# Patient Record
Sex: Male | Born: 1935 | Race: Black or African American | Hispanic: No | Marital: Single | Smoking: Former smoker
Health system: Southern US, Community
[De-identification: ages and names within clinical notes are randomized; demographics above are authoritative.]

## PROBLEM LIST (undated history)

## (undated) DIAGNOSIS — E785 Hyperlipidemia, unspecified: Secondary | ICD-10-CM

## (undated) DIAGNOSIS — I2699 Other pulmonary embolism without acute cor pulmonale: Secondary | ICD-10-CM

## (undated) DIAGNOSIS — K635 Polyp of colon: Secondary | ICD-10-CM

## (undated) DIAGNOSIS — I10 Essential (primary) hypertension: Secondary | ICD-10-CM

## (undated) HISTORY — DX: Polyp of colon: K63.5

## (undated) HISTORY — DX: Hyperlipidemia, unspecified: E78.5

---

## 2006-04-04 ENCOUNTER — Emergency Department (HOSPITAL_COMMUNITY): Admission: EM | Admit: 2006-04-04 | Discharge: 2006-04-04 | Payer: Self-pay | Admitting: Emergency Medicine

## 2006-07-09 ENCOUNTER — Inpatient Hospital Stay (HOSPITAL_COMMUNITY): Admission: EM | Admit: 2006-07-09 | Discharge: 2006-07-11 | Payer: Self-pay | Admitting: Emergency Medicine

## 2006-07-28 ENCOUNTER — Ambulatory Visit (HOSPITAL_COMMUNITY): Admission: RE | Admit: 2006-07-28 | Discharge: 2006-07-28 | Payer: Self-pay | Admitting: Critical Care Medicine

## 2007-01-17 ENCOUNTER — Ambulatory Visit: Payer: Self-pay | Admitting: Critical Care Medicine

## 2007-01-17 LAB — CONVERTED CEMR LAB
ALT: 36 units/L (ref 0–40)
AST: 30 units/L (ref 0–37)
Albumin: 3.3 g/dL — ABNORMAL LOW (ref 3.5–5.2)
Alkaline Phosphatase: 39 units/L (ref 39–117)
BUN: 10 mg/dL (ref 6–23)
Bilirubin, Direct: 0.2 mg/dL (ref 0.0–0.3)
CO2: 33 meq/L — ABNORMAL HIGH (ref 19–32)
Calcium: 8.9 mg/dL (ref 8.4–10.5)
Chloride: 105 meq/L (ref 96–112)
Creatinine, Ser: 1.1 mg/dL (ref 0.4–1.5)
GFR calc Af Amer: 85 mL/min
GFR calc non Af Amer: 70 mL/min
Glucose, Bld: 93 mg/dL (ref 70–99)
Hemoglobin: 13.5 g/dL (ref 13.0–17.0)
INR: 1.1 (ref 0.9–2.0)
Potassium: 3.4 meq/L — ABNORMAL LOW (ref 3.5–5.1)
Prothrombin Time: 12.8 s (ref 10.0–14.0)
Sodium: 141 meq/L (ref 135–145)
Total Bilirubin: 0.9 mg/dL (ref 0.3–1.2)
Total Protein: 7.7 g/dL (ref 6.0–8.3)

## 2009-01-25 ENCOUNTER — Emergency Department (HOSPITAL_COMMUNITY): Admission: EM | Admit: 2009-01-25 | Discharge: 2009-01-25 | Payer: Self-pay | Admitting: Emergency Medicine

## 2009-01-26 ENCOUNTER — Emergency Department (HOSPITAL_COMMUNITY): Admission: EM | Admit: 2009-01-26 | Discharge: 2009-01-26 | Payer: Self-pay | Admitting: Emergency Medicine

## 2009-05-14 LAB — HM COLONOSCOPY

## 2010-12-25 ENCOUNTER — Emergency Department (HOSPITAL_COMMUNITY)
Admission: EM | Admit: 2010-12-25 | Discharge: 2010-12-25 | Disposition: A | Discharge status: Home / Self Care | Payer: Medicare Other | Attending: Emergency Medicine | Admitting: Emergency Medicine

## 2010-12-25 ENCOUNTER — Emergency Department (HOSPITAL_COMMUNITY): Payer: Medicare Other

## 2010-12-25 DIAGNOSIS — Y929 Unspecified place or not applicable: Secondary | ICD-10-CM | POA: Insufficient documentation

## 2010-12-25 DIAGNOSIS — E119 Type 2 diabetes mellitus without complications: Secondary | ICD-10-CM | POA: Insufficient documentation

## 2010-12-25 DIAGNOSIS — R29898 Other symptoms and signs involving the musculoskeletal system: Secondary | ICD-10-CM | POA: Insufficient documentation

## 2010-12-25 DIAGNOSIS — S62309A Unspecified fracture of unspecified metacarpal bone, initial encounter for closed fracture: Secondary | ICD-10-CM | POA: Insufficient documentation

## 2010-12-25 DIAGNOSIS — W230XXA Caught, crushed, jammed, or pinched between moving objects, initial encounter: Secondary | ICD-10-CM | POA: Insufficient documentation

## 2010-12-25 DIAGNOSIS — I10 Essential (primary) hypertension: Secondary | ICD-10-CM | POA: Insufficient documentation

## 2010-12-25 DIAGNOSIS — M7989 Other specified soft tissue disorders: Secondary | ICD-10-CM | POA: Insufficient documentation

## 2010-12-25 DIAGNOSIS — M79609 Pain in unspecified limb: Secondary | ICD-10-CM | POA: Insufficient documentation

## 2010-12-25 DIAGNOSIS — IMO0002 Reserved for concepts with insufficient information to code with codable children: Secondary | ICD-10-CM | POA: Insufficient documentation

## 2010-12-25 DIAGNOSIS — Z79899 Other long term (current) drug therapy: Secondary | ICD-10-CM | POA: Insufficient documentation

## 2011-01-06 LAB — POCT I-STAT, CHEM 8
BUN: 18 mg/dL (ref 6–23)
Calcium, Ion: 1.2 mmol/L (ref 1.12–1.32)
Chloride: 99 mEq/L (ref 96–112)
Creatinine, Ser: 1.2 mg/dL (ref 0.4–1.5)
Glucose, Bld: 367 mg/dL — ABNORMAL HIGH (ref 70–99)
HCT: 40 % (ref 39.0–52.0)
Hemoglobin: 13.6 g/dL (ref 13.0–17.0)
Potassium: 3.6 mEq/L (ref 3.5–5.1)
Sodium: 134 mEq/L — ABNORMAL LOW (ref 135–145)
TCO2: 26 mmol/L (ref 0–100)

## 2011-01-06 LAB — GLUCOSE, CAPILLARY: Glucose-Capillary: 391 mg/dL — ABNORMAL HIGH (ref 70–99)

## 2011-01-07 LAB — POCT I-STAT 3, VENOUS BLOOD GAS (G3P V)
Acid-Base Excess: 1 mmol/L (ref 0.0–2.0)
Bicarbonate: 25.6 mEq/L — ABNORMAL HIGH (ref 20.0–24.0)
O2 Saturation: 78 %
TCO2: 27 mmol/L (ref 0–100)
pCO2, Ven: 41.5 mmHg — ABNORMAL LOW (ref 45.0–50.0)
pH, Ven: 7.398 — ABNORMAL HIGH (ref 7.250–7.300)
pO2, Ven: 43 mmHg (ref 30.0–45.0)

## 2011-01-07 LAB — DIFFERENTIAL
Basophils Absolute: 0 10*3/uL (ref 0.0–0.1)
Basophils Relative: 0 % (ref 0–1)
Eosinophils Absolute: 0.1 10*3/uL (ref 0.0–0.7)
Eosinophils Relative: 2 % (ref 0–5)
Lymphocytes Relative: 31 % (ref 12–46)
Lymphs Abs: 1.9 10*3/uL (ref 0.7–4.0)
Monocytes Absolute: 0.5 10*3/uL (ref 0.1–1.0)
Monocytes Relative: 8 % (ref 3–12)
Neutro Abs: 3.7 10*3/uL (ref 1.7–7.7)
Neutrophils Relative %: 60 % (ref 43–77)

## 2011-01-07 LAB — GLUCOSE, CAPILLARY
Glucose-Capillary: 487 mg/dL — ABNORMAL HIGH (ref 70–99)
Glucose-Capillary: >600 mg/dL (ref 70–99)

## 2011-01-07 LAB — POCT CARDIAC MARKERS
CKMB, poc: 4.5 ng/mL (ref 1.0–8.0)
Myoglobin, poc: 190 ng/mL (ref 12–200)
Troponin i, poc: <0.05 ng/mL (ref 0.00–0.09)

## 2011-01-07 LAB — URINALYSIS, ROUTINE W REFLEX MICROSCOPIC
Bilirubin Urine: NEGATIVE
Glucose, UA: >1000 mg/dL — AB
Hgb urine dipstick: NEGATIVE
Ketones, ur: NEGATIVE mg/dL
Leukocytes, UA: NEGATIVE
Nitrite: NEGATIVE
Protein, ur: NEGATIVE mg/dL
Specific Gravity, Urine: 1.024 (ref 1.005–1.030)
Urobilinogen, UA: 0.2 mg/dL (ref 0.0–1.0)
pH: 5.5 (ref 5.0–8.0)

## 2011-01-07 LAB — BASIC METABOLIC PANEL
BUN: 28 mg/dL — ABNORMAL HIGH (ref 6–23)
CO2: 24 mEq/L (ref 19–32)
Calcium: 9.1 mg/dL (ref 8.4–10.5)
Chloride: 90 mEq/L — ABNORMAL LOW (ref 96–112)
Creatinine, Ser: 1.53 mg/dL — ABNORMAL HIGH (ref 0.4–1.5)
GFR calc Af Amer: 54 mL/min — ABNORMAL LOW (ref >60)
GFR calc non Af Amer: 45 mL/min — ABNORMAL LOW (ref >60)
Glucose, Bld: 721 mg/dL (ref 70–99)
Potassium: 4.1 mEq/L (ref 3.5–5.1)
Sodium: 123 mEq/L — ABNORMAL LOW (ref 135–145)

## 2011-01-07 LAB — URINE MICROSCOPIC-ADD ON

## 2011-01-07 LAB — CBC
HCT: 39.4 % (ref 39.0–52.0)
Hemoglobin: 13.7 g/dL (ref 13.0–17.0)
MCHC: 34.8 g/dL (ref 30.0–36.0)
MCV: 81.6 fL (ref 78.0–100.0)
Platelets: 179 10*3/uL (ref 150–400)
RBC: 4.83 MIL/uL (ref 4.22–5.81)
RDW: 14.2 % (ref 11.5–15.5)
WBC: 6.2 10*3/uL (ref 4.0–10.5)

## 2011-01-07 LAB — KETONES, QUALITATIVE: Acetone, Bld: NEGATIVE

## 2011-02-12 LAB — HEMOGLOBIN A1C: Hgb A1c MFr Bld: 5.4 % (ref 4.0–6.0)

## 2011-02-12 LAB — HEPATIC FUNCTION PANEL
ALT: 10 U/L (ref 10–40)
AST: 15 U/L (ref 14–40)
Alkaline Phosphatase: 49 U/L (ref 25–125)
Bilirubin, Total: 0.4 mg/dL

## 2011-02-12 LAB — LIPID PANEL
Cholesterol: 135 mg/dL (ref 0–200)
HDL: 46 mg/dL (ref 35–70)
LDL Cholesterol: 53 mg/dL
LDl/HDL Ratio: 2.9
Triglycerides: 180 mg/dL — AB (ref 40–160)

## 2011-02-12 LAB — BASIC METABOLIC PANEL
Glucose: 105 mg/dL
Potassium: 3.9 mmol/L (ref 3.4–5.3)

## 2011-02-13 NOTE — H&P (Signed)
NAME:  Sergio [name], Sergio [name]               ACCOUNT NO.:  [id number]   MEDICAL RECORD NO.:  [id number]          PATIENT TYPE:  INP   LOCATION:  0101                         FACILITY:  [facility]   PHYSICIAN:  Hollice Espy, M.D.DATE OF BIRTH:  1933/06/27   DATE OF ADMISSION:  07/09/2006  DATE OF DISCHARGE:                                HISTORY & PHYSICAL   PRIMARY CARE PHYSICIAN:  [facility].   CHIEF COMPLAINT:  Right-sided chest pain.   HISTORY OF PRESENT ILLNESS:  The patient is a 77 year old African-American  male with a past medical history of hypertension who presents to the  emergency room with 1-day episode of worsening chest pain on the right side  of his chest going down into his right axilla.  He has never had any  episodes like this before and came in to have this checked out.  In the  emergency room he was noted to have a blood pressure initially of 237/120.  He initially was given aspirin, morphine and nitroglycerin, and his blood  pressure has since then come down to as low as 155/79.  Since then it has  slightly trended up to the 190s and back down to the 160s.  The patient was  suspicious given his history of a pulmonary embolus and he had an elevated D-  dimer of 0.91.  The patient underwent a CT angiogram of his chest which  showed evidence of pulmonary emboli in the right upper and right lower lobe;  consistent with where his pain was.  At this point then, he was given  Lovenox and [facility] were called for admission.   The patient, himself, says that he is feeling much better.  He is not able  to give much of a history as far as why he had a blood clot.  He says that  he has not been ill lately; he has not been laid up in bed.  I asked him  about any long car trips, and he tells me that back about 6 weeks ago he  drove about 300 miles to drop his granddaughter off in college; however, the  time frame does not match up.  He tells me that he has not had any  previous  history of blood clots and no family history of blood clots.   The patient had an EKG done, also, on admission and he was noted to have  some flipped T waves in the lateral leads and possible strain pattern.  With  these findings he had cardiac enzymes done in the emergency room.  The  cardiac enzymes, on his first set, had a slightly elevated MB of 9.8 with a  normal CPK and troponin; however, the second set was completely normal.  Currently the patient is doing well.  He denies any headaches, vision  changes, or dysphagia.  He says that he can feel a little bit of right-sided  chest pain, but no palpitations, shortness of breath, wheeze, cough,  abdominal pain, hematuria, dysuria, constipation, diarrhea, local extremity  numbness, weakness or pain.   REVIEW OF SYSTEMS:  Otherwise  negative.   PAST MEDICAL HISTORY:  Significant just for hypertension.   MEDICATIONS:  1. Norvasc.  2. HCTZ.   ALLERGIES:  He has no known drug allergies.   SOCIAL HISTORY:  He denies any alcohol, tobacco, or drug [place].   FAMILY HISTORY:  Noncontributory.   PHYSICAL EXAMINATION:  VITAL SIGNS:  On admission temperature 97, heart rate  85, blood pressure initially 237/120.  Currently 165/76, respirations 22, O2  saturation 94% on room air.  GENERAL:  The patient is alert and oriented x3 in no apparent distress.  HEENT:  Normocephalic, atraumatic.  His mucous membranes are moist.  NECK:  He has no carotid bruits.  HEART:  Regular rate and rhythm with a 2/6 systolic ejection murmur.  LUNGS:  Clear to auscultation bilaterally.  ABDOMEN:  Soft, nontender.  Nondistended.  Positive bowel sounds.  EXTREMITIES:  No clubbing, cyanosis, or edema.   LAB WORK ON THIS PATIENT:  Sodium 139, potassium 3.4, chloride 104, bicarb  30, BUN 11, creatinine 0.9, glucose 184.  LFTs are noted for an albumin of  3.4 otherwise unremarkable.  White count 8.1, no shift.  H&H 14.2, 42.1, MCV  of 84, platelet count 235,  D-dimer slightly elevated at 0.91.  INR is 1, CT  is as HPI.  The first set of cardiac makers:  CPK 114, MB 9.8, troponin less  I than 0.05.  Second set: CPK 65, MB 5.7, troponin I less than 0.05.   ASSESSMENT AND PLAN:  1. Pulmonary embolus.  We will put the patient on Lovenox and Coumadin.      However, I have also contacted pulmonary critical care medicine to see      if the patient is candidate for a new drug study involving once a week      anticoagulant injection.  They will evaluate the patient and see if he      is a candidate.  2. Hypokalemia.  We will replace his potassium.  3. Hypertension.  I have added p.r.n. clonidine.  This may be more      secondary to stress and pain, also pain control.  4. Elevated blood sugar.  Will check hemoglobin A1c, rule out any      underlying diabetes that was not diagnosed.      Hollice Espy, M.D.  Electronically Signed     SKK/MEDQ  D:  07/09/2006  T:  07/09/2006  Job:  [id number]   cc:   Charlcie Cradle. Delford Field, MD, FCCP  [address]  [place]  [place] [address]

## 2011-02-13 NOTE — Discharge Summary (Signed)
NAME:  Sergio [name], Sergio [name]               ACCOUNT NO.:  [id number]   MEDICAL RECORD NO.:  [id number]          PATIENT TYPE:  INP   LOCATION:  1422                         FACILITY:  [facility]   PHYSICIAN:  Melissa L. Ladona Ridgel, MD  DATE OF BIRTH:  [birth year]   DATE OF ADMISSION:  07/09/2006  DATE OF DISCHARGE:  07/11/2006                           DISCHARGE SUMMARY - REFERRING   CHIEF COMPLAINT:  Right-sided chest pain.   DISCHARGE DIAGNOSES:  1. Pulmonary embolus of unclear etiology.  The patient presented to the      emergency room with one day of worsening right-sided chest pain      traveling down to his right axilla.  He has had previous episodes like      this but did not have them checked out.  He initially was hypertensive      in the emergency room and was treated with morphine, nitroglycerin and      aspirin.  The patient evidently had a history of pulmonary embolus      according to the admission note, therefore he underwent CT scanning of      the chest which showed evidence for a pulmonary embolus in the right      upper and right lower lobes consistent with the areas of pain.  The      patient was initially started on Lovenox and Coumadin and then was      evaluated by the research team from [facility] for an investigational drug      treating pulmonary emboli.  Patient agreed to participate in the      current study titled E[employer]IN 08-04-01 which uses a factor Xa inhibitor      by the name of Rivaroxaban.  The study is an open label study and the      patient was selected to start Rivaroxaban at 15 mg b.i.d.  Patient has      been monitored for 48 hours and has no obvious sequelae from starting      the medication and his symptoms of chest pain have all but decreased.      The patient will be followed very closely by the study coordinators.      The pager number for the study coordinators are Maylene Roes, [address]      [address]; [address], [phone]; and Jefm Bryant, [phone].   Please      note, Maylene Roes has given her pager to the patient for calls      concerning any problems.  Dr. Shan Levans is the principal      investigator and his number is [phone].  Patient therefore will be      discharged to home with the Rivaroxaban and will follow along with the      team at home.  2. Diabetes, newly diagnosed.  The patient's hemoglobin A1c was noted to      be elevated at 7.9.  We therefore have started him on Glucotrol and      have provided him with a prescription for a meter and strips.  Will  also make an outpatient referral to the diabetes care center so that he      can learn how to use this and provide him with printed material      regarding diabetic diet.  We would ask that his primary care physician      please coordinate his care with regard to this.  3. Hyperlipidemia.  In light of his underlying diabetes, I will start him      on Zocor 20 mg to treat that.  4. Hypertension.  The patient's blood pressure on admission was quite      elevated.  In resuming his Norvasc and hydrochlorothiazide, we have      obtained reasonable control so he will continue with Norvasc 10 mg once      daily and hydrochlorothiazide 12.5 mg once daily.  5. Very poor dentition.  The patient should be encouraged to see a dentist      for further care of his teeth.   DISCHARGE MEDICATIONS:  1. Glucotrol XL 5 mg once daily.  2. Norvasc 10 mg once daily.  3. Hydrochlorothiazide 12.5 mg once daily.  4. Zocor 20 mg once daily.  5. The study drug, Rivaroxaban 15 mg b.i.d.   HOSPITAL COURSE:  The patient is a very pleasant 77 year old gentleman with  a past medical history of hypertension who presented with chest pain and was  found to have a pulmonary embolus on CT scan.  The patient had no obvious  risk factors for pulmonary embolus, although there is question of whether he  may have had a history of a pulmonary embolus.  The patient was initially  treated with  Lovenox and ultimately underwent a review by the [facility] study  for enrollment.  Patient elected to enroll in the [facility] study using the  factor Xa inhibitor Rivaroxaban and he will be followed as an outpatient by  the study team.  As stated during this hospital stay, this patient has been  diagnosed with diabetes and hyperlipidemia for which he has been started on  medications and will need a follow-up to evaluate future laboratories and  complaints with treatment.   On the day of discharge the patient remains clinically stable.  He  occasionally has a little bit of right-sided chest pain in the area of the  pulmonary embolus but on the day of discharge his blood pressure is 148/74,  temperature 98, pulse 59, respirations 18, saturations 97%.  His blood  sugars have been 135, 118, 158 and he just was started on his Glucotrol.   PHYSICAL EXAMINATION:  GENERAL APPEARANCE:  This is a moderately obese  African American male in no acute distress.  VITAL SIGNS:  He is normocephalic and atraumatic.  HEENT:  Pupils are equal, round and reactive to light.  Extraocular muscles  are intact.  Moist mucous membranes.  The patient has generally poor  dentition.  NECK:  Supple.  There is no JVD, there is no carotid bruit.  CHEST:  Clear to auscultation but decreased.  CARDIOVASCULAR:  Regular rate and rhythm with positive S1 and S2, no S3, S4,  no murmurs, rubs, or gallops.  ABDOMEN:  Obese, nontender, nondistended with positive bowel sounds.  EXTREMITIES:  No clubbing, cyanosis, or edema.  NEUROLOGIC:  He is awake, alert and oriented.  Cranial nerves II-XII intact.  Power is 5/5.  Deep tendon reflexes are 2+.   PERTINENT DISCHARGE LABORATORY DATA:  Patient's lipid panel shows a  cholesterol of 125 with triglycerides  of 159 and HDL of 36 which is low and  an HDL of 57.  His sodium is 141, potassium 3.4, chloride 101, CO2 31, BUN 10, creatinine 1.1, glucose 140.  Troponins were negative.   Hemoglobin A1c  was 7.9.  His D-dimer was 0.91.   At this time, the patient needs to follow up with an established primary  care physician and continue to follow with the research team with regard to  his experimental medications.      Melissa L. Ladona Ridgel, MD  Electronically Signed     MLT/MEDQ  D:  07/11/2006  T:  07/11/2006  Job:  [id number]   cc:   Charlcie Cradle. Delford Field, MD, FCCP  [address]  [place]  [place] [address]   Dr. Merla Riches  [facility] on [address]  Valinda Hoar [phone]

## 2011-04-14 ENCOUNTER — Emergency Department (HOSPITAL_COMMUNITY)
Admission: EM | Admit: 2011-04-14 | Discharge: 2011-04-14 | Disposition: A | Discharge status: Home / Self Care | Payer: Medicare Other | Attending: Emergency Medicine | Admitting: Emergency Medicine

## 2011-04-14 ENCOUNTER — Emergency Department (HOSPITAL_COMMUNITY): Payer: Medicare Other

## 2011-04-14 DIAGNOSIS — E119 Type 2 diabetes mellitus without complications: Secondary | ICD-10-CM | POA: Insufficient documentation

## 2011-04-14 DIAGNOSIS — IMO0001 Reserved for inherently not codable concepts without codable children: Secondary | ICD-10-CM | POA: Insufficient documentation

## 2011-04-14 DIAGNOSIS — M25559 Pain in unspecified hip: Secondary | ICD-10-CM | POA: Insufficient documentation

## 2011-04-14 DIAGNOSIS — I10 Essential (primary) hypertension: Secondary | ICD-10-CM | POA: Insufficient documentation

## 2011-04-14 DIAGNOSIS — Z79899 Other long term (current) drug therapy: Secondary | ICD-10-CM | POA: Insufficient documentation

## 2011-06-04 ENCOUNTER — Emergency Department (HOSPITAL_COMMUNITY)
Admission: EM | Admit: 2011-06-04 | Discharge: 2011-06-04 | Disposition: A | Discharge status: Home / Self Care | Payer: Medicare Other | Attending: Emergency Medicine | Admitting: Emergency Medicine

## 2011-06-04 DIAGNOSIS — E876 Hypokalemia: Secondary | ICD-10-CM | POA: Insufficient documentation

## 2011-06-04 DIAGNOSIS — M7989 Other specified soft tissue disorders: Secondary | ICD-10-CM | POA: Insufficient documentation

## 2011-06-04 DIAGNOSIS — I10 Essential (primary) hypertension: Secondary | ICD-10-CM | POA: Insufficient documentation

## 2011-06-04 DIAGNOSIS — E119 Type 2 diabetes mellitus without complications: Secondary | ICD-10-CM | POA: Insufficient documentation

## 2011-06-04 DIAGNOSIS — R609 Edema, unspecified: Secondary | ICD-10-CM | POA: Insufficient documentation

## 2011-06-04 DIAGNOSIS — M79609 Pain in unspecified limb: Secondary | ICD-10-CM | POA: Insufficient documentation

## 2011-06-04 LAB — BASIC METABOLIC PANEL
BUN: 17 mg/dL (ref 6–23)
CO2: 31 mEq/L (ref 19–32)
Calcium: 9.3 mg/dL (ref 8.4–10.5)
Chloride: 104 mEq/L (ref 96–112)
Creatinine, Ser: 1.12 mg/dL (ref 0.50–1.35)
GFR calc Af Amer: >60 mL/min (ref >60)
GFR calc non Af Amer: >60 mL/min (ref >60)
Glucose, Bld: 123 mg/dL — ABNORMAL HIGH (ref 70–99)
Potassium: 3 mEq/L — ABNORMAL LOW (ref 3.5–5.1)
Sodium: 141 mEq/L (ref 135–145)

## 2011-06-04 LAB — GLUCOSE, CAPILLARY: Glucose-Capillary: 193 mg/dL — ABNORMAL HIGH (ref 70–99)

## 2011-08-22 ENCOUNTER — Emergency Department (HOSPITAL_COMMUNITY): Payer: Medicare Other

## 2011-08-22 ENCOUNTER — Encounter: Payer: Self-pay | Admitting: *Deleted

## 2011-08-22 ENCOUNTER — Emergency Department (HOSPITAL_COMMUNITY)
Admission: EM | Admit: 2011-08-22 | Discharge: 2011-08-22 | Disposition: A | Discharge status: Home / Self Care | Payer: Medicare Other | Attending: Emergency Medicine | Admitting: Emergency Medicine

## 2011-08-22 DIAGNOSIS — Z79899 Other long term (current) drug therapy: Secondary | ICD-10-CM | POA: Insufficient documentation

## 2011-08-22 DIAGNOSIS — M7989 Other specified soft tissue disorders: Secondary | ICD-10-CM | POA: Insufficient documentation

## 2011-08-22 DIAGNOSIS — R0789 Other chest pain: Secondary | ICD-10-CM | POA: Insufficient documentation

## 2011-08-22 DIAGNOSIS — R6883 Chills (without fever): Secondary | ICD-10-CM | POA: Insufficient documentation

## 2011-08-22 DIAGNOSIS — R5381 Other malaise: Secondary | ICD-10-CM | POA: Insufficient documentation

## 2011-08-22 DIAGNOSIS — R059 Cough, unspecified: Secondary | ICD-10-CM | POA: Insufficient documentation

## 2011-08-22 DIAGNOSIS — R0682 Tachypnea, not elsewhere classified: Secondary | ICD-10-CM | POA: Insufficient documentation

## 2011-08-22 DIAGNOSIS — E119 Type 2 diabetes mellitus without complications: Secondary | ICD-10-CM | POA: Insufficient documentation

## 2011-08-22 DIAGNOSIS — R062 Wheezing: Secondary | ICD-10-CM | POA: Insufficient documentation

## 2011-08-22 DIAGNOSIS — I10 Essential (primary) hypertension: Secondary | ICD-10-CM | POA: Insufficient documentation

## 2011-08-22 DIAGNOSIS — J3489 Other specified disorders of nose and nasal sinuses: Secondary | ICD-10-CM | POA: Insufficient documentation

## 2011-08-22 DIAGNOSIS — R05 Cough: Secondary | ICD-10-CM | POA: Insufficient documentation

## 2011-08-22 DIAGNOSIS — R630 Anorexia: Secondary | ICD-10-CM | POA: Insufficient documentation

## 2011-08-22 DIAGNOSIS — J40 Bronchitis, not specified as acute or chronic: Secondary | ICD-10-CM | POA: Insufficient documentation

## 2011-08-22 HISTORY — DX: Essential (primary) hypertension: I10

## 2011-08-22 LAB — DIFFERENTIAL
Basophils Absolute: 0 10*3/uL (ref 0.0–0.1)
Basophils Relative: 0 % (ref 0–1)
Eosinophils Absolute: 0.1 10*3/uL (ref 0.0–0.7)
Eosinophils Relative: 2 % (ref 0–5)
Lymphocytes Relative: 28 % (ref 12–46)
Lymphs Abs: 1.7 10*3/uL (ref 0.7–4.0)
Monocytes Absolute: 0.9 10*3/uL (ref 0.1–1.0)
Monocytes Relative: 15 % — ABNORMAL HIGH (ref 3–12)
Neutro Abs: 3.3 10*3/uL (ref 1.7–7.7)
Neutrophils Relative %: 55 % (ref 43–77)

## 2011-08-22 LAB — BASIC METABOLIC PANEL
BUN: 14 mg/dL (ref 6–23)
CO2: 28 mEq/L (ref 19–32)
Calcium: 8.8 mg/dL (ref 8.4–10.5)
Chloride: 98 mEq/L (ref 96–112)
Creatinine, Ser: 1.42 mg/dL — ABNORMAL HIGH (ref 0.50–1.35)
GFR calc Af Amer: 54 mL/min — ABNORMAL LOW (ref >90)
GFR calc non Af Amer: 46 mL/min — ABNORMAL LOW (ref >90)
Glucose, Bld: 118 mg/dL — ABNORMAL HIGH (ref 70–99)
Potassium: 3.4 mEq/L — ABNORMAL LOW (ref 3.5–5.1)
Sodium: 135 mEq/L (ref 135–145)

## 2011-08-22 LAB — CBC
HCT: 39.4 % (ref 39.0–52.0)
Hemoglobin: 13.5 g/dL (ref 13.0–17.0)
MCH: 28.9 pg (ref 26.0–34.0)
MCHC: 34.3 g/dL (ref 30.0–36.0)
MCV: 84.4 fL (ref 78.0–100.0)
Platelets: 193 10*3/uL (ref 150–400)
RBC: 4.67 MIL/uL (ref 4.22–5.81)
RDW: 15 % (ref 11.5–15.5)
WBC: 6 10*3/uL (ref 4.0–10.5)

## 2011-08-22 LAB — PRO B NATRIURETIC PEPTIDE: Pro B Natriuretic peptide (BNP): 75.3 pg/mL (ref 0–450)

## 2011-08-22 MED ORDER — IPRATROPIUM BROMIDE 0.02 % IN SOLN
0.5000 mg | Freq: Once | RESPIRATORY_TRACT | Status: AC
  Administered 2011-08-22: 0.5 mg via RESPIRATORY_TRACT
  Filled 2011-08-22: qty 2.5

## 2011-08-22 MED ORDER — AEROCHAMBER PLUS W/MASK MISC
Status: AC
  Administered 2011-08-22: 19:00:00
  Filled 2011-08-22: qty 1

## 2011-08-22 MED ORDER — ALBUTEROL SULFATE (5 MG/ML) 0.5% IN NEBU
5.0000 mg | INHALATION_SOLUTION | Freq: Once | RESPIRATORY_TRACT | Status: AC
  Administered 2011-08-22: 5 mg via RESPIRATORY_TRACT
  Filled 2011-08-22: qty 1

## 2011-08-22 MED ORDER — AZITHROMYCIN 250 MG PO TABS: ORAL_TABLET | ORAL | Status: AC

## 2011-08-22 MED ORDER — ALBUTEROL SULFATE HFA 108 (90 BASE) MCG/ACT IN AERS
1.0000 | INHALATION_SPRAY | Freq: Four times a day (QID) | RESPIRATORY_TRACT | Status: DC | PRN
  Administered 2011-08-22: 2 via RESPIRATORY_TRACT
  Filled 2011-08-22: qty 6.7

## 2011-08-22 NOTE — ED Notes (Signed)
Patient states he noted he was not feeling good on Wed,  On Thurs morning he noted onset of cough, nasal congestion, weakness, and nothing tastes good.  Patient reports nonproductive cough

## 2011-08-22 NOTE — ED Provider Notes (Signed)
History     CSN: [id number] Arrival date & time: 08/22/2011  2:07 PM   First MD Initiated Contact with Patient 08/22/11 1531      Chief Complaint  Patient presents with  . URI   HPI Patient states he's had a cough since Wednesday. He has felt chilled and has had some nasal congestion. Patient states the cough is nonproductive. He thinks cold weather makes it worse. Nothing has particularly made it better. He has not measured any fevers. He does feel like his appetite has been decreased. Patient states when he coughs he does have some chest discomfort that is sharp and otherwise he has no chest pain. He states he i had some  leg swelling but that is not really new. He denies any recent trips or travel. Patient denies any history of lung or heart problems. Patient does work outside at night and he feels this could be contributing to his illness. Past Medical History  Diagnosis Date  . Diabetes mellitus   . Hypertension     History reviewed. No pertinent past surgical history.  No family history on file.  History  Substance Use Topics  . Smoking status: Never Smoker   . Smokeless tobacco: Not on file  . Alcohol Use: No      Review of Systems  Constitutional: Positive for chills and fatigue.  Respiratory: Negative for choking.   Genitourinary: Negative for frequency.  Neurological: Negative for headaches.  All other systems reviewed and are negative.    Allergies  Review of patient's allergies indicates not on file.  Home Medications   Current Outpatient Rx  Name Route Sig Dispense Refill  . AMLODIPINE BESYLATE 10 MG PO TABS Oral Take 10 mg by mouth daily.      Marland Kitchen ALKA-SELTZER PLUS COLD PO Oral Take 1 packet by mouth 2 (two) times daily as needed. To ease cold symptoms     . GLIPIZIDE 10 MG PO TABS Oral Take 10 mg by mouth daily.        BP 158/86  Pulse 77  Temp(Src) 99.7 F (37.6 C) (Oral)  Resp 24  Ht 5\' 3"  (1.6 m)  Wt 190 lb (86.183 kg)  BMI 33.66 kg/m2   SpO2 96%  Physical Exam  Nursing note and vitals reviewed. Constitutional: He appears well-developed and well-nourished. No distress.  HENT:  Head: Normocephalic and atraumatic.  Right Ear: External ear normal.  Left Ear: External ear normal.  Eyes: Conjunctivae are normal. Right eye exhibits no discharge. Left eye exhibits no discharge. No scleral icterus.  Neck: Neck supple. No tracheal deviation present.  Cardiovascular: Normal rate, regular rhythm and intact distal pulses.   Pulmonary/Chest: Effort normal and breath sounds normal. No stridor. No respiratory distress. Wheezes: faint wheeze noted at end expiration. He has no rales.       Tachypnea noted  Abdominal: Soft. Bowel sounds are normal. He exhibits no distension. There is no tenderness. There is no rebound and no guarding.  Musculoskeletal: He exhibits no edema and no tenderness.  Neurological: He is alert. He has normal strength. No sensory deficit. Cranial nerve deficit:  no gross defecits noted. He exhibits normal muscle tone. He displays no seizure activity. Coordination normal.  Skin: Skin is warm and dry. No rash noted.  Psychiatric: He has a normal mood and affect.    ED Course  Procedures (including critical care time)  Labs Reviewed  DIFFERENTIAL - Abnormal; Notable for the following:    Monocytes Relative 15 (*)  All other components within normal limits  CBC  BASIC METABOLIC PANEL  PRO B NATRIURETIC PEPTIDE   Dg Chest 2 View  08/22/2011  *RADIOLOGY REPORT*  Clinical Data: 77 year old male with cough and congestion.  CHEST - 2 VIEW  Comparison: 01/25/2009  Findings: Cardiomegaly and mild peribronchial thickening again identified. There is no evidence of focal airspace disease, pulmonary edema, pulmonary nodule/mass, pleural effusion, or pneumothorax. No acute bony abnormalities are identified.  IMPRESSION: Cardiomegaly without evidence of acute cardiopulmonary disease.  Original Report Authenticated By:  Rosendo Gros, M.D.     1. Bronchitis       MDM  Patient without signs of pneumonia on x-ray. No evidence to suggest congestive heart failure. Patient has no risk factors for pulmonary embolism. The symptoms are suggestive of an infectious etiology.  Patient felt better after the breathing treatment. He'll be discharged home with an albuterol inhaler and a course of azithromycin.        Celene Kras, MD 08/22/11 [id number]

## 2011-08-22 NOTE — Discharge Instructions (Signed)

## 2011-12-18 ENCOUNTER — Encounter: Payer: Self-pay | Admitting: *Deleted

## 2011-12-18 DIAGNOSIS — I10 Essential (primary) hypertension: Secondary | ICD-10-CM | POA: Insufficient documentation

## 2011-12-18 DIAGNOSIS — E119 Type 2 diabetes mellitus without complications: Secondary | ICD-10-CM | POA: Insufficient documentation

## 2011-12-18 DIAGNOSIS — E785 Hyperlipidemia, unspecified: Secondary | ICD-10-CM | POA: Insufficient documentation

## 2011-12-29 ENCOUNTER — Ambulatory Visit (INDEPENDENT_AMBULATORY_CARE_PROVIDER_SITE_OTHER): Payer: Self-pay | Admitting: Emergency Medicine

## 2011-12-29 ENCOUNTER — Encounter: Payer: Self-pay | Admitting: Emergency Medicine

## 2011-12-29 VITALS — BP 201/89 | HR 68 | Temp 97.9°F | Resp 16 | Ht 62.0 in | Wt 204.2 lb

## 2011-12-29 DIAGNOSIS — E119 Type 2 diabetes mellitus without complications: Secondary | ICD-10-CM

## 2011-12-29 DIAGNOSIS — E782 Mixed hyperlipidemia: Secondary | ICD-10-CM

## 2011-12-29 DIAGNOSIS — IMO0001 Reserved for inherently not codable concepts without codable children: Secondary | ICD-10-CM

## 2011-12-29 DIAGNOSIS — E785 Hyperlipidemia, unspecified: Secondary | ICD-10-CM | POA: Diagnosis not present

## 2011-12-29 DIAGNOSIS — I10 Essential (primary) hypertension: Secondary | ICD-10-CM

## 2011-12-29 LAB — COMPREHENSIVE METABOLIC PANEL
ALT: 23 U/L (ref 0–53)
AST: 23 U/L (ref 0–37)
Albumin: 3.9 g/dL (ref 3.5–5.2)
Alkaline Phosphatase: 52 U/L (ref 39–117)
BUN: 17 mg/dL (ref 6–23)
CO2: 26 mEq/L (ref 19–32)
Calcium: 9.3 mg/dL (ref 8.4–10.5)
Chloride: 104 mEq/L (ref 96–112)
Creat: 1.29 mg/dL (ref 0.50–1.35)
Glucose, Bld: 169 mg/dL — ABNORMAL HIGH (ref 70–99)
Potassium: 3.6 mEq/L (ref 3.5–5.3)
Sodium: 140 mEq/L (ref 135–145)
Total Bilirubin: 0.5 mg/dL (ref 0.3–1.2)
Total Protein: 7.2 g/dL (ref 6.0–8.3)

## 2011-12-29 LAB — LIPID PANEL
Cholesterol: 95 mg/dL (ref 0–200)
HDL: 34 mg/dL — ABNORMAL LOW (ref >39)
LDL Cholesterol: 34 mg/dL (ref 0–99)
Total CHOL/HDL Ratio: 2.8 Ratio
Triglycerides: 137 mg/dL (ref <150)
VLDL: 27 mg/dL (ref 0–40)

## 2011-12-29 LAB — POCT GLYCOSYLATED HEMOGLOBIN (HGB A1C): Hemoglobin A1C: 9.5

## 2011-12-29 LAB — GLUCOSE, POCT (MANUAL RESULT ENTRY): POC Glucose: 194

## 2011-12-29 MED ORDER — METFORMIN HCL ER 500 MG PO TB24: 500.0000 mg | ORAL_TABLET | Freq: Every day | ORAL | Status: DC

## 2011-12-29 MED ORDER — GLIPIZIDE 10 MG PO TABS: 10.0000 mg | ORAL_TABLET | Freq: Every day | ORAL | Status: DC

## 2011-12-29 NOTE — Progress Notes (Signed)
  Subjective:    Patient ID: Sergio [name], male    DOB: 02-24-1933, 78 y.o.   MRN: [id number]  HPI patient in for followup of his high blood pressure diabetes and high cholesterol. He has not been here since May 2012. At that time his hemoglobin A1c was 5.4 with a sugar of 106 cholesterol 135. The test results he brings in today showed sugars ranging from 200-350. I suspect he has not been taking his medications as instructed    Review of Systems he denies chest pain shortness of breath or any other GI symptoms. He denies any problems with that his feet except he is unable to cut his nails.    Objective:   Physical Exam  Constitutional: He appears well-nourished.  HENT:  Head: Normocephalic.  Eyes: Pupils are equal, round, and reactive to light.  Neck: No JVD present. No tracheal deviation present. No thyromegaly present.  Cardiovascular: Normal rate and regular rhythm.   Pulmonary/Chest: Breath sounds normal. He has no wheezes. He has no rales.  Abdominal: Soft. There is no rebound.  Lymphadenopathy:    He has no cervical adenopathy.   Examination of his feet reveals he has sensation to his feet pulses are 1+ he does have significant nail deformities       Assessment & Plan:   Patient has diabetes and high cholesterol and hypertension none of which are under control. I've increased his Glucotrol to 10 mg one a day. I may readmit foremen extended release 500 one a day. 4 he had GI problems and would not take it because it bothered him at work .

## 2011-12-30 LAB — MICROALBUMIN, URINE: Microalb, Ur: 4.31 mg/dL — ABNORMAL HIGH (ref 0.00–1.89)

## 2011-12-30 LAB — CBC WITH DIFFERENTIAL/PLATELET
Basophils Absolute: 0 10*3/uL (ref 0.0–0.1)
Basophils Relative: 0 % (ref 0–1)
Eosinophils Absolute: 0.3 10*3/uL (ref 0.0–0.7)
Eosinophils Relative: 5 % (ref 0–5)
HCT: 36.4 % — ABNORMAL LOW (ref 39.0–52.0)
Hemoglobin: 11.9 g/dL — ABNORMAL LOW (ref 13.0–17.0)
Lymphocytes Relative: 32 % (ref 12–46)
Lymphs Abs: 1.7 10*3/uL (ref 0.7–4.0)
MCH: 27.2 pg (ref 26.0–34.0)
MCHC: 32.7 g/dL (ref 30.0–36.0)
MCV: 83.3 fL (ref 78.0–100.0)
Monocytes Absolute: 0.4 10*3/uL (ref 0.1–1.0)
Monocytes Relative: 7 % (ref 3–12)
Neutro Abs: 2.9 10*3/uL (ref 1.7–7.7)
Neutrophils Relative %: 56 % (ref 43–77)
Platelets: 222 10*3/uL (ref 150–400)
RBC: 4.37 MIL/uL (ref 4.22–5.81)
RDW: 14.8 % (ref 11.5–15.5)
WBC: 5.3 10*3/uL (ref 4.0–10.5)

## 2011-12-31 ENCOUNTER — Telehealth: Payer: Self-pay

## 2011-12-31 NOTE — Telephone Encounter (Signed)
Please call into [facility] on [address]:  Glucometer of choice #1  Use as directed. May also call in:  Glucose strips of choice #1 package, no refill  Lancets of choice 1 package, no refill Thank you

## 2011-12-31 NOTE — Telephone Encounter (Signed)
DAUGHTER IN LAW STATES DR DAUB WAS GOING TO SEND IN RX FOR GLUCOSE METER BUT [facility] ON [address] DOES NOT HAVE A RX FOR A GLUCOSE METER   PLEASE ADVISE

## 2012-01-01 ENCOUNTER — Telehealth: Payer: Self-pay

## 2012-01-01 NOTE — Telephone Encounter (Signed)
Called rx into pharm for any glucometer, 100 lancets and 100 test strips with 4 RF's each per Eula Listen, PA-C

## 2012-01-01 NOTE — Telephone Encounter (Signed)
The patient can have any meter that his insurance will cover and that the pharmacy has in stock if the patient wants to use [facility].

## 2012-01-01 NOTE — Telephone Encounter (Signed)
I called [facility] on [address]. [phone] the pharmacist informed me that they do not cary the Choice #1 brand. They cannot fill this order, he asked if there is any way that another brand be called in. He suggested that maybe it is a mail order only brand.

## 2012-01-11 NOTE — Telephone Encounter (Signed)
pts son called back after receiving unable to reach letter about labs. Gave him results and instr's from Dr Cleta Alberts. Agreed to f/up 3 mos

## 2012-02-09 ENCOUNTER — Ambulatory Visit: Payer: Medicare Other | Admitting: Emergency Medicine

## 2012-03-28 ENCOUNTER — Other Ambulatory Visit: Payer: Self-pay | Admitting: Family Medicine

## 2012-05-13 ENCOUNTER — Ambulatory Visit (INDEPENDENT_AMBULATORY_CARE_PROVIDER_SITE_OTHER): Payer: Self-pay | Admitting: Emergency Medicine

## 2012-05-13 VITALS — BP 208/74 | HR 73 | Temp 97.9°F | Resp 20 | Ht 62.0 in | Wt 200.8 lb

## 2012-05-13 DIAGNOSIS — E119 Type 2 diabetes mellitus without complications: Secondary | ICD-10-CM

## 2012-05-13 DIAGNOSIS — E7800 Pure hypercholesterolemia, unspecified: Secondary | ICD-10-CM

## 2012-05-13 LAB — GLUCOSE, POCT (MANUAL RESULT ENTRY): POC Glucose: 157 mg/dl — AB (ref 70–99)

## 2012-05-13 LAB — POCT GLYCOSYLATED HEMOGLOBIN (HGB A1C): Hemoglobin A1C: 13

## 2012-05-13 MED ORDER — METFORMIN HCL ER 500 MG PO TB24: 500.0000 mg | ORAL_TABLET | Freq: Every day | ORAL | Status: DC

## 2012-05-13 MED ORDER — GLIPIZIDE ER 10 MG PO TB24: 10.0000 mg | ORAL_TABLET | Freq: Every day | ORAL | Status: DC

## 2012-05-13 NOTE — Progress Notes (Signed)
  Subjective:    Patient ID: Sergio [name], male    DOB: 23-Dec-1932, 78 y.o.   MRN: [id number]  HPIPt has a history of diabetes.  Pt currently only taking glipizide because other medications make him have diarrhea.  Pts feet appear to be swollen.  Pt hasn't seen cardiologist in over a year.  He has not been compliant with his medicines or his diet has not been exercising. 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  HENT:  Head: Normocephalic.  Eyes: Pupils are equal, round, and reactive to light.  Neck: No thyromegaly present.  Cardiovascular: Normal rate and regular rhythm.   Pulmonary/Chest: Effort normal and breath sounds normal.  Abdominal: Soft. Bowel sounds are normal.  Neurological:       This has for swelling of the lower extremities pulses are present. He has no ulcerations seen.   Results for orders placed in visit on 05/13/12  POCT GLYCOSYLATED HEMOGLOBIN (HGB A1C)      Component Value Range   Hemoglobin A1C 13.0    GLUCOSE, POCT (MANUAL RESULT ENTRY)      Component Value Range   POC Glucose 157 (*) 70 - 99 mg/dl         Assessment & Plan:  The patient is noncompliant with his medications and with his diet not exercising and gaining weight. We'll try getting back on his medications. I did increase his glipizide to 10 mg he states the metformin gives her diarrhea by the time of a half dose to start with and then one a day to see if can get him to 500 mg. His blood pressures out of control not sure he is taking his medications for this either. I also advised him he needs needed diabetes eye Dr.. I told the patient he was a time bomb if he did not start to get these medical issues under control. I told him I needed to see him in one month. I did not stop his amlodipine even though he does have ankle edema because his blood  pressure is not anywhere close to control when he comes back I may just him off of amlodipine and see if that would help with his lower extremity swelling

## 2012-05-14 ENCOUNTER — Other Ambulatory Visit: Payer: Self-pay | Admitting: Family Medicine

## 2012-05-14 LAB — BASIC METABOLIC PANEL
BUN: 23 mg/dL (ref 6–23)
CO2: 24 mEq/L (ref 19–32)
Calcium: 9.7 mg/dL (ref 8.4–10.5)
Chloride: 100 mEq/L (ref 96–112)
Creat: 1.28 mg/dL (ref 0.50–1.35)
Glucose, Bld: 160 mg/dL — ABNORMAL HIGH (ref 70–99)
Potassium: 3.8 mEq/L (ref 3.5–5.3)
Sodium: 136 mEq/L (ref 135–145)

## 2012-05-14 LAB — LIPID PANEL
Cholesterol: 120 mg/dL (ref 0–200)
HDL: 34 mg/dL — ABNORMAL LOW (ref >39)
LDL Cholesterol: 51 mg/dL (ref 0–99)
Total CHOL/HDL Ratio: 3.5 Ratio
Triglycerides: 177 mg/dL — ABNORMAL HIGH (ref <150)
VLDL: 35 mg/dL (ref 0–40)

## 2012-06-23 ENCOUNTER — Other Ambulatory Visit: Payer: Self-pay

## 2012-06-23 MED ORDER — SIMVASTATIN 20 MG PO TABS: 20.0000 mg | ORAL_TABLET | Freq: Every day | ORAL | Status: DC

## 2012-06-23 MED ORDER — LISINOPRIL-HYDROCHLOROTHIAZIDE 20-12.5 MG PO TABS: 1.0000 | ORAL_TABLET | Freq: Every day | ORAL | Status: DC

## 2012-11-23 ENCOUNTER — Encounter (HOSPITAL_COMMUNITY): Payer: Self-pay | Admitting: *Deleted

## 2012-11-23 ENCOUNTER — Emergency Department (HOSPITAL_COMMUNITY)
Admission: EM | Admit: 2012-11-23 | Discharge: 2012-11-24 | Disposition: A | Discharge status: Home / Self Care | Payer: Medicare Other | Attending: Emergency Medicine | Admitting: Emergency Medicine

## 2012-11-23 DIAGNOSIS — E119 Type 2 diabetes mellitus without complications: Secondary | ICD-10-CM | POA: Diagnosis not present

## 2012-11-23 DIAGNOSIS — R112 Nausea with vomiting, unspecified: Secondary | ICD-10-CM | POA: Diagnosis not present

## 2012-11-23 DIAGNOSIS — R9431 Abnormal electrocardiogram [ECG] [EKG]: Secondary | ICD-10-CM | POA: Diagnosis not present

## 2012-11-23 DIAGNOSIS — Z86711 Personal history of pulmonary embolism: Secondary | ICD-10-CM | POA: Insufficient documentation

## 2012-11-23 DIAGNOSIS — Z79899 Other long term (current) drug therapy: Secondary | ICD-10-CM | POA: Insufficient documentation

## 2012-11-23 DIAGNOSIS — E785 Hyperlipidemia, unspecified: Secondary | ICD-10-CM | POA: Insufficient documentation

## 2012-11-23 DIAGNOSIS — Z7982 Long term (current) use of aspirin: Secondary | ICD-10-CM | POA: Insufficient documentation

## 2012-11-23 DIAGNOSIS — Z87891 Personal history of nicotine dependence: Secondary | ICD-10-CM | POA: Insufficient documentation

## 2012-11-23 DIAGNOSIS — E1169 Type 2 diabetes mellitus with other specified complication: Secondary | ICD-10-CM | POA: Diagnosis not present

## 2012-11-23 DIAGNOSIS — I10 Essential (primary) hypertension: Secondary | ICD-10-CM | POA: Diagnosis not present

## 2012-11-23 DIAGNOSIS — R739 Hyperglycemia, unspecified: Secondary | ICD-10-CM

## 2012-11-23 LAB — GLUCOSE, CAPILLARY
Glucose-Capillary: 358 mg/dL — ABNORMAL HIGH (ref 70–99)
Glucose-Capillary: 372 mg/dL — ABNORMAL HIGH (ref 70–99)

## 2012-11-23 LAB — COMPREHENSIVE METABOLIC PANEL WITH GFR
ALT: 26 U/L (ref 0–53)
AST: 29 U/L (ref 0–37)
Albumin: 3.6 g/dL (ref 3.5–5.2)
Alkaline Phosphatase: 73 U/L (ref 39–117)
BUN: 17 mg/dL (ref 6–23)
CO2: 27 meq/L (ref 19–32)
Calcium: 9.7 mg/dL (ref 8.4–10.5)
Chloride: 97 meq/L (ref 96–112)
Creatinine, Ser: 1.13 mg/dL (ref 0.50–1.35)
GFR calc Af Amer: 70 mL/min — ABNORMAL LOW
GFR calc non Af Amer: 61 mL/min — ABNORMAL LOW
Glucose, Bld: 398 mg/dL — ABNORMAL HIGH (ref 70–99)
Potassium: 4.2 meq/L (ref 3.5–5.1)
Sodium: 133 meq/L — ABNORMAL LOW (ref 135–145)
Total Bilirubin: 0.6 mg/dL (ref 0.3–1.2)
Total Protein: 8.6 g/dL — ABNORMAL HIGH (ref 6.0–8.3)

## 2012-11-23 MED ORDER — ONDANSETRON HCL 4 MG/2ML IJ SOLN
4.0000 mg | Freq: Once | INTRAMUSCULAR | Status: AC
  Administered 2012-11-24: 4 mg via INTRAVENOUS
  Filled 2012-11-23: qty 2

## 2012-11-23 MED ORDER — METFORMIN HCL 500 MG PO TABS
500.0000 mg | ORAL_TABLET | Freq: Once | ORAL | Status: AC
  Administered 2012-11-24: 500 mg via ORAL
  Filled 2012-11-23: qty 1

## 2012-11-23 MED ORDER — GLIPIZIDE ER 10 MG PO TB24
10.0000 mg | ORAL_TABLET | Freq: Once | ORAL | Status: AC
  Administered 2012-11-24: 10 mg via ORAL
  Filled 2012-11-23: qty 1

## 2012-11-23 MED ORDER — SODIUM CHLORIDE 0.9 % IV BOLUS (SEPSIS)
1000.0000 mL | Freq: Once | INTRAVENOUS | Status: AC
  Administered 2012-11-23: 1000 mL via INTRAVENOUS

## 2012-11-23 MED ORDER — SODIUM CHLORIDE 0.9 % IV BOLUS (SEPSIS)
1000.0000 mL | Freq: Once | INTRAVENOUS | Status: AC
  Administered 2012-11-24: 1000 mL via INTRAVENOUS

## 2012-11-23 NOTE — ED Provider Notes (Signed)
History     CSN: [id number]  Arrival date & time 11/23/12  1909   First MD Initiated Contact with Patient 11/23/12 2341      Chief Complaint  Patient presents with  . Emesis    (Consider location/radiation/quality/duration/timing/severity/associated sxs/prior treatment) HPI Hx per PT.   Ran out of glipizide and metformin yesterday.  He then developed N/V x 3 episodes. No diarrhea. No F/C. No ABd pain. No CP or SOB. Today noticed elevated blood sugars.  PCP Dr Cleta Alberts.  Has refills at the pharmacy and states he is able to pick them up today. Mod in severity, some nausea now Past Medical History  Diagnosis Date  . Diabetes mellitus   . Hypertension   . Other and unspecified hyperlipidemia     Past Surgical History  Procedure Laterality Date  . Pulmonary embolism surgery  2004    No family history on file.  History  Substance Use Topics  . Smoking status: Former Smoker -- 17 years    Types: Cigars    Quit date: 09/29/1975  . Smokeless tobacco: Not on file  . Alcohol Use: No      Review of Systems  Constitutional: Negative for fever and chills.  HENT: Negative for neck pain and neck stiffness.   Eyes: Negative for pain.  Respiratory: Negative for cough, shortness of breath and wheezing.   Cardiovascular: Negative for chest pain.  Gastrointestinal: Positive for nausea and vomiting. Negative for abdominal pain.  Genitourinary: Negative for dysuria.  Musculoskeletal: Negative for back pain.  Skin: Negative for rash.  Neurological: Negative for headaches.  All other systems reviewed and are negative.    Allergies  Review of patient's allergies indicates no known allergies.  Home Medications   Current Outpatient Rx  Name  Route  Sig  Dispense  Refill  . amLODipine (NORVASC) 10 MG tablet   Oral   Take 10 mg by mouth daily.           Marland Kitchen aspirin 81 MG tablet   Oral   Take 81 mg by mouth daily.         . carvedilol (COREG) 12.5 MG tablet      TAKE ONE  TABLET BY MOUTH TWICE DAILY   60 tablet   2   . Chlorphen-Phenyleph-ASA (ALKA-SELTZER PLUS COLD PO)   Oral   Take 1 packet by mouth 2 (two) times daily as needed. To ease cold symptoms          . glipiZIDE (GLUCOTROL XL) 10 MG 24 hr tablet   Oral   Take 1 tablet (10 mg total) by mouth daily.   30 tablet   11   . lisinopril-hydrochlorothiazide (PRINZIDE,ZESTORETIC) 20-12.5 MG per tablet   Oral   Take 1 tablet by mouth daily.   30 tablet   5   . metFORMIN (GLUCOPHAGE XR) 500 MG 24 hr tablet   Oral   Take 1 tablet (500 mg total) by mouth daily with breakfast.   30 tablet   11   . simvastatin (ZOCOR) 20 MG tablet   Oral   Take 1 tablet (20 mg total) by mouth daily.   30 tablet   0     BP 159/86  Pulse 61  Temp(Src) 97.6 F (36.4 C) (Oral)  Resp 18  SpO2 97%  Physical Exam  Constitutional: He is oriented to person, place, and time. He appears well-developed and well-nourished.  HENT:  Head: Normocephalic and atraumatic.  Eyes: Conjunctivae and EOM are  normal. Pupils are equal, round, and reactive to light.  Neck: Trachea normal. Neck supple. No thyromegaly present.  Cardiovascular: Normal rate, regular rhythm, S1 normal, S2 normal and normal pulses.     No systolic murmur is present   No diastolic murmur is present  Pulses:      Radial pulses are 2+ on the right side, and 2+ on the left side.  Pulmonary/Chest: Effort normal and breath sounds normal. He has no wheezes. He has no rhonchi. He has no rales. He exhibits no tenderness.  Abdominal: Soft. Normal appearance and bowel sounds are normal. There is no tenderness. There is no CVA tenderness and negative Murphy's sign.  Musculoskeletal:  Calves nontender, no cords or erythema, negative Homans sign  Neurological: He is alert and oriented to person, place, and time. He has normal strength. No cranial nerve deficit or sensory deficit. GCS eye subscore is 4. GCS verbal subscore is 5. GCS motor subscore is 6.   Skin: Skin is warm and dry. No rash noted. He is not diaphoretic.  Psychiatric: He has a normal mood and affect. His speech is normal.    ED Course  Procedures (including critical care time)  Results for orders placed during the hospital encounter of 11/23/12  COMPREHENSIVE METABOLIC PANEL      Result Value Range   Sodium 133 (*) 135 - 145 mEq/L   Potassium 4.2  3.5 - 5.1 mEq/L   Chloride 97  96 - 112 mEq/L   CO2 27  19 - 32 mEq/L   Glucose, Bld 398 (*) 70 - 99 mg/dL   BUN 17  6 - 23 mg/dL   Creatinine, Ser 7.82  0.50 - 1.35 mg/dL   Calcium 9.7  8.4 - 95.6 mg/dL   Total Protein 8.6 (*) 6.0 - 8.3 g/dL   Albumin 3.6  3.5 - 5.2 g/dL   AST 29  0 - 37 U/L   ALT 26  0 - 53 U/L   Alkaline Phosphatase 73  39 - 117 U/L   Total Bilirubin 0.6  0.3 - 1.2 mg/dL   GFR calc non Af Amer 61 (*) >90 mL/min   GFR calc Af Amer 70 (*) >90 mL/min  GLUCOSE, CAPILLARY      Result Value Range   Glucose-Capillary 372 (*) 70 - 99 mg/dL  GLUCOSE, CAPILLARY      Result Value Range   Glucose-Capillary 358 (*) 70 - 99 mg/dL   Comment 1 Notify RN     Comment 2 Documented in Chart    TROPONIN I      Result Value Range   Troponin I <0.30  <0.30 ng/mL  GLUCOSE, CAPILLARY      Result Value Range   Glucose-Capillary 267 (*) 70 - 99 mg/dL   Comment 1 Notify RN     Comment 2 Documented in Chart    POCT I-STAT TROPONIN I      Result Value Range   Troponin i, poc 0.01  0.00 - 0.08 ng/mL   Comment 3            IVFs x 2L   Date: 11/23/2012  Rate: 72  Rhythm: normal sinus rhythm  QRS Axis: normal  Intervals: normal  ST/T Wave abnormalities: nonspecific ST/T changes  Conduction Disutrbances:none  Narrative Interpretation:   Old EKG Reviewed: none available  Also ran out of carvedilol, has Rx for that as well. He agrees to fill everything today and not miss any more doses  3:49 AM  feeling much better and wants to go home. Has close PCP follow up. Serial troponins negative with abnormal ECG - plan  CAR f/u with referral provided.  MDM  N/V and hyperglycemia after running out of home medications for DM and HTN  Evaluated with ECG, labs and serial evaluations  Treated IVFs, home medications and zofran  Abnormal ECG - no previous. Serial troponin obtained delta 6 hours.  VS/ BP improving      Sunnie Nielsen, MD 11/25/12 [phone]

## 2012-11-23 NOTE — ED Notes (Signed)
EDP at bedside for assessment 

## 2012-11-23 NOTE — ED Notes (Signed)
Pt states his Blood pressure is high, and so is his CBG. Home CBG over 400.

## 2012-11-24 ENCOUNTER — Other Ambulatory Visit: Payer: Self-pay | Admitting: Physician Assistant

## 2012-11-24 LAB — GLUCOSE, CAPILLARY: Glucose-Capillary: 267 mg/dL — ABNORMAL HIGH (ref 70–99)

## 2012-11-24 LAB — POCT I-STAT TROPONIN I: Troponin i, poc: 0.01 ng/mL (ref 0.00–0.08)

## 2012-11-24 LAB — TROPONIN I: Troponin I: <0.3 ng/mL (ref <0.30)

## 2012-11-24 MED ORDER — SODIUM CHLORIDE 0.9 % IV SOLN
INTRAVENOUS | Status: DC
  Administered 2012-11-24: 03:00:00 via INTRAVENOUS

## 2013-05-08 ENCOUNTER — Other Ambulatory Visit: Payer: Self-pay | Admitting: Family Medicine

## 2013-05-14 ENCOUNTER — Encounter (HOSPITAL_COMMUNITY): Payer: Self-pay | Admitting: Emergency Medicine

## 2013-05-14 ENCOUNTER — Emergency Department (HOSPITAL_COMMUNITY)
Admission: EM | Admit: 2013-05-14 | Discharge: 2013-05-14 | Disposition: A | Discharge status: Home / Self Care | Payer: Medicare Other | Attending: Emergency Medicine | Admitting: Emergency Medicine

## 2013-05-14 DIAGNOSIS — Z862 Personal history of diseases of the blood and blood-forming organs and certain disorders involving the immune mechanism: Secondary | ICD-10-CM | POA: Diagnosis not present

## 2013-05-14 DIAGNOSIS — Z7982 Long term (current) use of aspirin: Secondary | ICD-10-CM | POA: Diagnosis not present

## 2013-05-14 DIAGNOSIS — E1169 Type 2 diabetes mellitus with other specified complication: Secondary | ICD-10-CM | POA: Insufficient documentation

## 2013-05-14 DIAGNOSIS — Z86711 Personal history of pulmonary embolism: Secondary | ICD-10-CM | POA: Insufficient documentation

## 2013-05-14 DIAGNOSIS — Z79899 Other long term (current) drug therapy: Secondary | ICD-10-CM | POA: Insufficient documentation

## 2013-05-14 DIAGNOSIS — Z8639 Personal history of other endocrine, nutritional and metabolic disease: Secondary | ICD-10-CM | POA: Insufficient documentation

## 2013-05-14 DIAGNOSIS — I10 Essential (primary) hypertension: Secondary | ICD-10-CM | POA: Diagnosis not present

## 2013-05-14 DIAGNOSIS — Z87891 Personal history of nicotine dependence: Secondary | ICD-10-CM | POA: Diagnosis not present

## 2013-05-14 HISTORY — DX: Other pulmonary embolism without acute cor pulmonale: I26.99

## 2013-05-14 LAB — GLUCOSE, CAPILLARY
Glucose-Capillary: 126 mg/dL — ABNORMAL HIGH (ref 70–99)
Glucose-Capillary: 134 mg/dL — ABNORMAL HIGH (ref 70–99)

## 2013-05-14 LAB — POCT I-STAT, CHEM 8
BUN: 11 mg/dL (ref 6–23)
Calcium, Ion: 1.24 mmol/L (ref 1.13–1.30)
Chloride: 103 mEq/L (ref 96–112)
Creatinine, Ser: 1.4 mg/dL — ABNORMAL HIGH (ref 0.50–1.35)
Glucose, Bld: 154 mg/dL — ABNORMAL HIGH (ref 70–99)
HCT: 44 % (ref 39.0–52.0)
Hemoglobin: 15 g/dL (ref 13.0–17.0)
Potassium: 3.8 mEq/L (ref 3.5–5.1)
Sodium: 142 mEq/L (ref 135–145)
TCO2: 27 mmol/L (ref 0–100)

## 2013-05-14 NOTE — ED Provider Notes (Addendum)
CSN: [id number]     Arrival date & time 05/14/13  1503 History     First MD Initiated Contact with Patient 05/14/13 1638     Chief Complaint  Patient presents with  . Hypertension  . Hyperglycemia   (Consider location/radiation/quality/duration/timing/severity/associated sxs/prior Treatment) Patient is a 79 y.o. male presenting with hypertension and hyperglycemia. The history is provided by the patient.  Hypertension Pertinent negatives include no chest pain, no abdominal pain, no headaches and no shortness of breath.  Hyperglycemia Associated symptoms: no abdominal pain, no chest pain, no confusion, no fever, no nausea, no shortness of breath and no vomiting   pt with hx niddm, htn, presents indicating family member checked his bp and it was high, so told him he had to go to ER.  bp 190-200 systolic.  Pt states he felt fine, at baseline, was asymptomatic, prior to and since checking bp. No headache. No change in vision or speech. No numbness/weakness. No cp or sob. No swelling. Pt states compliant w his normal meds, no recent change in meds.      Past Medical History  Diagnosis Date  . Diabetes mellitus   . Hypertension   . Other and unspecified hyperlipidemia   . PE (pulmonary embolism)    History reviewed. No pertinent past surgical history. No family history on file. History  Substance Use Topics  . Smoking status: Former Smoker -- 17 years    Types: Cigars    Quit date: 09/29/1975  . Smokeless tobacco: Not on file  . Alcohol Use: No    Review of Systems  Constitutional: Negative for fever and chills.  HENT: Negative for neck pain.   Eyes: Negative for visual disturbance.  Respiratory: Negative for cough and shortness of breath.   Cardiovascular: Negative for chest pain and leg swelling.  Gastrointestinal: Negative for nausea, vomiting and abdominal pain.  Genitourinary: Negative for flank pain.  Musculoskeletal: Negative for back pain.  Skin: Negative for rash.   Neurological: Negative for weakness, numbness and headaches.  Hematological: Does not bruise/bleed easily.  Psychiatric/Behavioral: Negative for confusion.    Allergies  Review of patient's allergies indicates no known allergies.  Home Medications   Current Outpatient Rx  Name  Route  Sig  Dispense  Refill  . aspirin 81 MG tablet   Oral   Take 81 mg by mouth daily.         . carvedilol (COREG) 12.5 MG tablet   Oral   Take 1 tablet (12.5 mg total) by mouth 2 (two) times daily with a meal. Need office visit for additional refills.   60 tablet   0   . glipiZIDE (GLUCOTROL XL) 10 MG 24 hr tablet   Oral   Take 1 tablet (10 mg total) by mouth daily.   30 tablet   11   . metFORMIN (GLUCOPHAGE XR) 500 MG 24 hr tablet   Oral   Take 1 tablet (500 mg total) by mouth daily with breakfast.   30 tablet   11    BP 179/96  Pulse 51  Temp(Src) 97.9 F (36.6 C) (Oral)  Resp 18  Ht 5\' 3"  (1.6 m)  Wt 160 lb (72.576 kg)  BMI 28.35 kg/m2  SpO2 98% Physical Exam  Nursing note and vitals reviewed. Constitutional: He is oriented to person, place, and time. He appears well-developed and well-nourished. No distress.  HENT:  Mouth/Throat: Oropharynx is clear and moist.  Eyes: Conjunctivae are normal. Pupils are equal, round, and reactive to  light.  Neck: Neck supple. No JVD present. No tracheal deviation present. No thyromegaly present.  Cardiovascular: Normal rate, regular rhythm, normal heart sounds and intact distal pulses.   Pulmonary/Chest: Effort normal and breath sounds normal. No accessory muscle usage. No respiratory distress.  Abdominal: Soft. He exhibits no distension. There is no tenderness.  No bruit  Musculoskeletal: Normal range of motion. He exhibits no edema and no tenderness.  Neurological: He is alert and oriented to person, place, and time.  Motor intact bil. Steady gait.   Skin: Skin is warm and dry.  Psychiatric: He has a normal mood and affect.    ED  Course   Procedures (including critical care time)  Results for orders placed during the hospital encounter of 05/14/13  GLUCOSE, CAPILLARY      Result Value Range   Glucose-Capillary 126 (*) 70 - 99 mg/dL   Comment 1 Notify RN     Comment 2 Documented in Chart    GLUCOSE, CAPILLARY      Result Value Range   Glucose-Capillary 134 (*) 70 - 99 mg/dL   Comment 1 Documented in Chart     Comment 2 Notify RN    POCT I-STAT, CHEM 8      Result Value Range   Sodium 142  135 - 145 mEq/L   Potassium 3.8  3.5 - 5.1 mEq/L   Chloride 103  96 - 112 mEq/L   BUN 11  6 - 23 mg/dL   Creatinine, Ser 1.47 (*) 0.50 - 1.35 mg/dL   Glucose, Bld 829 (*) 70 - 99 mg/dL   Calcium, Ion 5.62  1.30 - 1.30 mmol/L   TCO2 27  0 - 100 mmol/L   Hemoglobin 15.0  13.0 - 17.0 g/dL   HCT 86.5  78.4 - 69.6 %      MDM  Labs sent from triage.  Pt states he feels fine, at baseline, was asymptomatic today, but that family member checked his bp and was high.  Pt remains asymptomatic, and appears stable for d/c.  Repeat bp 179/96.   Will have f/u pcp 1-2 days for recheck, recheck bp, assessment meds.    Reviewed nursing notes and prior charts for additional history.     Suzi Roots, MD 05/14/13 1719  Suzi Roots, MD 05/14/13 [phone]

## 2013-05-14 NOTE — ED Notes (Addendum)
Pt reports high BP (200/100) x 4 days. Pt reports taking his prescribed medications as ordered. Pt denies any other symptoms.

## 2013-05-14 NOTE — ED Notes (Signed)
Discharge instructions reviewed. Pt verbalized understanding.  

## 2013-05-14 NOTE — ED Notes (Signed)
Pt to ED for evaluation of hyperglycemia and hypertension.  Pt states he took his blood sugar at home and it was 278- pt is diabetic controlled with PO medications, denies taking any insulin.  Upon arrival to ED pts blood pressure was found to be elevated.  Pt denies feeling bad- here for further evaluation.

## 2013-05-17 ENCOUNTER — Ambulatory Visit (INDEPENDENT_AMBULATORY_CARE_PROVIDER_SITE_OTHER): Payer: Medicare Other | Admitting: Family Medicine

## 2013-05-17 VITALS — BP 128/82 | HR 58 | Temp 98.0°F | Resp 17 | Ht 61.0 in | Wt 196.0 lb

## 2013-05-17 DIAGNOSIS — B351 Tinea unguium: Secondary | ICD-10-CM | POA: Diagnosis not present

## 2013-05-17 DIAGNOSIS — E119 Type 2 diabetes mellitus without complications: Secondary | ICD-10-CM | POA: Diagnosis not present

## 2013-05-17 DIAGNOSIS — I10 Essential (primary) hypertension: Secondary | ICD-10-CM | POA: Diagnosis not present

## 2013-05-17 DIAGNOSIS — H269 Unspecified cataract: Secondary | ICD-10-CM

## 2013-05-17 LAB — COMPREHENSIVE METABOLIC PANEL
ALT: 29 U/L (ref 0–53)
AST: 26 U/L (ref 0–37)
Albumin: 4 g/dL (ref 3.5–5.2)
Alkaline Phosphatase: 46 U/L (ref 39–117)
BUN: 15 mg/dL (ref 6–23)
CO2: 26 mEq/L (ref 19–32)
Calcium: 9.5 mg/dL (ref 8.4–10.5)
Chloride: 105 mEq/L (ref 96–112)
Creat: 1.32 mg/dL (ref 0.50–1.35)
Glucose, Bld: 130 mg/dL — ABNORMAL HIGH (ref 70–99)
Potassium: 4.1 mEq/L (ref 3.5–5.3)
Sodium: 140 mEq/L (ref 135–145)
Total Bilirubin: 0.7 mg/dL (ref 0.3–1.2)
Total Protein: 8.1 g/dL (ref 6.0–8.3)

## 2013-05-17 LAB — LIPID PANEL
Cholesterol: 131 mg/dL (ref 0–200)
HDL: 37 mg/dL — ABNORMAL LOW (ref >39)
LDL Cholesterol: 68 mg/dL (ref 0–99)
Total CHOL/HDL Ratio: 3.5 Ratio
Triglycerides: 129 mg/dL (ref <150)
VLDL: 26 mg/dL (ref 0–40)

## 2013-05-17 LAB — POCT GLYCOSYLATED HEMOGLOBIN (HGB A1C): Hemoglobin A1C: 9

## 2013-05-17 LAB — GLUCOSE, POCT (MANUAL RESULT ENTRY): POC Glucose: 136 mg/dl — AB (ref 70–99)

## 2013-05-17 MED ORDER — LISINOPRIL-HYDROCHLOROTHIAZIDE 10-12.5 MG PO TABS: 1.0000 | ORAL_TABLET | Freq: Every day | ORAL | Status: DC

## 2013-05-17 MED ORDER — KETOCONAZOLE 2 % EX CREA: TOPICAL_CREAM | Freq: Every day | CUTANEOUS | Status: DC

## 2013-05-17 MED ORDER — CARVEDILOL 12.5 MG PO TABS: 12.5000 mg | ORAL_TABLET | Freq: Two times a day (BID) | ORAL | Status: DC

## 2013-05-17 NOTE — Progress Notes (Signed)
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  His blood pressure has been high (207/104 highest) last 5 days.  Objective:  NAD, overweight elderly man HEENT: Essentially edentulous with one tooth remaining, bilateral cataracts. Neck: Supple no adenopathy or bruits Chest: Clear Heart: Regular no murmur Extremities: Normal monofilament, 2+ edema, good pulses, left foot tinea pedis between the toe and second toe. Results for orders placed in visit on 05/17/13  POCT GLYCOSYLATED HEMOGLOBIN (HGB A1C)      Result Value Range   Hemoglobin A1C 9.0    GLUCOSE, POCT (MANUAL RESULT ENTRY)      Result Value Range   POC Glucose 136 (*) 70 - 99 mg/dl    Assessment:  Really controlled diabetes, cataracts, blood pressure which is borderline high. Patient has problems with his feet only but her attention. His dentition is poor. Basically the man not taking care of himself. I talked to his son and they will take a daily walk.  Plan: Recheck 1 month Diabetes - Plan: POCT glycosylated hemoglobin (Hb A1C), POCT glucose (manual entry), Comprehensive metabolic panel, Lipid panel  Cataracts, bilateral - Plan: Ambulatory referral to Ophthalmology  Onychomycosis - Plan: Ambulatory referral to Podiatry, ketoconazole (NIZORAL) 2 % cream  Hypertension - Plan: lisinopril-hydrochlorothiazide (PRINZIDE,ZESTORETIC) 10-12.5 MG per tablet, carvedilol (COREG) 12.5 MG tablet  Signed, Elvina Sidle, MD

## 2013-05-17 NOTE — Patient Instructions (Addendum)
Please return in one month for reevaluation.

## 2013-06-07 ENCOUNTER — Other Ambulatory Visit: Payer: Self-pay | Admitting: Emergency Medicine

## 2013-12-14 ENCOUNTER — Other Ambulatory Visit: Payer: Self-pay | Admitting: Family Medicine

## 2013-12-18 ENCOUNTER — Other Ambulatory Visit: Payer: Self-pay | Admitting: Family Medicine

## 2014-04-02 ENCOUNTER — Ambulatory Visit (INDEPENDENT_AMBULATORY_CARE_PROVIDER_SITE_OTHER): Payer: Medicare Other | Admitting: Family Medicine

## 2014-04-02 ENCOUNTER — Encounter: Payer: Self-pay | Admitting: Family Medicine

## 2014-04-02 VITALS — BP 181/86 | HR 61 | Temp 98.3°F | Resp 18 | Ht 61.5 in | Wt 192.6 lb

## 2014-04-02 DIAGNOSIS — Z23 Encounter for immunization: Secondary | ICD-10-CM | POA: Diagnosis not present

## 2014-04-02 DIAGNOSIS — E119 Type 2 diabetes mellitus without complications: Secondary | ICD-10-CM | POA: Diagnosis not present

## 2014-04-02 DIAGNOSIS — I10 Essential (primary) hypertension: Secondary | ICD-10-CM | POA: Diagnosis not present

## 2014-04-02 DIAGNOSIS — E785 Hyperlipidemia, unspecified: Secondary | ICD-10-CM

## 2014-04-02 LAB — CBC WITH DIFFERENTIAL/PLATELET
Basophils Absolute: 0 10*3/uL (ref 0.0–0.1)
Basophils Relative: 0 % (ref 0–1)
Eosinophils Absolute: 0.2 10*3/uL (ref 0.0–0.7)
Eosinophils Relative: 4 % (ref 0–5)
HCT: 39.7 % (ref 39.0–52.0)
Hemoglobin: 13.7 g/dL (ref 13.0–17.0)
Lymphocytes Relative: 40 % (ref 12–46)
Lymphs Abs: 2.3 10*3/uL (ref 0.7–4.0)
MCH: 27.6 pg (ref 26.0–34.0)
MCHC: 34.5 g/dL (ref 30.0–36.0)
MCV: 80 fL (ref 78.0–100.0)
Monocytes Absolute: 0.4 10*3/uL (ref 0.1–1.0)
Monocytes Relative: 7 % (ref 3–12)
Neutro Abs: 2.8 10*3/uL (ref 1.7–7.7)
Neutrophils Relative %: 49 % (ref 43–77)
Platelets: 226 10*3/uL (ref 150–400)
RBC: 4.96 MIL/uL (ref 4.22–5.81)
RDW: 15.1 % (ref 11.5–15.5)
WBC: 5.7 10*3/uL (ref 4.0–10.5)

## 2014-04-02 LAB — POCT URINALYSIS DIPSTICK
Bilirubin, UA: NEGATIVE
Blood, UA: NEGATIVE
Glucose, UA: 250
Ketones, UA: NEGATIVE
Leukocytes, UA: NEGATIVE
Nitrite, UA: NEGATIVE
Protein, UA: 100
Spec Grav, UA: 1.01
Urobilinogen, UA: 0.2
pH, UA: 6

## 2014-04-02 LAB — POCT GLYCOSYLATED HEMOGLOBIN (HGB A1C): Hemoglobin A1C: 13.2

## 2014-04-02 MED ORDER — METFORMIN HCL ER 500 MG PO TB24: 500.0000 mg | ORAL_TABLET | Freq: Every day | ORAL | Status: DC

## 2014-04-02 MED ORDER — LISINOPRIL-HYDROCHLOROTHIAZIDE 10-12.5 MG PO TABS: 1.0000 | ORAL_TABLET | Freq: Every day | ORAL | Status: DC

## 2014-04-02 MED ORDER — GLIPIZIDE ER 10 MG PO TB24: 10.0000 mg | ORAL_TABLET | Freq: Every day | ORAL | Status: DC

## 2014-04-02 MED ORDER — CARVEDILOL 12.5 MG PO TABS: 12.5000 mg | ORAL_TABLET | Freq: Two times a day (BID) | ORAL | Status: DC

## 2014-04-02 MED ORDER — METFORMIN HCL ER (MOD) 1000 MG PO TB24: 1000.0000 mg | ORAL_TABLET | Freq: Every day | ORAL | Status: DC

## 2014-04-02 NOTE — Patient Instructions (Signed)
Pneumococcal Conjugate Vaccine  What You Need to Know  Your doctor recommends that you, or your child, get a dose of PCV13 vaccine today.  WHY GET VACCINATED?  Pneumococcal conjugate vaccine (called PCV13 or Prevnar 13) is recommended to protect infants and toddlers, and some older children and adults with certain health conditions, from pneumococcal disease.  Pneumococcal disease is caused by infection with Streptococcus pneumoniae bacteria. These bacteria can spread from person to person through close contact.  Pneumococcal disease can lead to severe health problems, including pneumonia, blood infections, and meningitis.  Meningitis is an infection of the covering of the brain. Pneumococcal meningitis is fairly rare (less than 1 case per 100,000 people each year), but it leads to other health problems, including deafness and brain damage. In children, it is fatal in about 1 case out of 10.  Children younger than two are at higher risk for serious disease than older children.  People with certain medical conditions, people over age 65, and cigarette smokers are also at higher risk.  Before vaccine, pneumococcal infections caused many problems each year in the [place] in children younger than 5, including:  · more than 700 cases of meningitis,  · 13,000 blood infections,  · about 5 million ear infections, and  · about 200 deaths.  About 4,000 adults still die each year because of pneumococcal infections.  Pneumococcal infections can be hard to treat because some strains are resistant to antibiotics. This makes prevention through vaccination even more important.  PCV13 VACCINE  There are more than 90 types of pneumococcal bacteria. PCV13 protects against 13 of them. These 13 strains cause most severe infections in children and about half of infections in adults.   PCV13 is routinely given to children at 2, 4, 6, and 12-15 months of age. Children in this age range are at greatest risk for serious diseases caused  by pneumococcal infection.  PCV13 vaccine may also be recommended for some older children or adults. Your doctor can give you details.  A second type of pneumococcal vaccine, called PPSV23, may also be given to some children and adults, including anyone over age 65. There is a separate Vaccine Information Statement for this vaccine.  PRECAUTIONS   Anyone who has ever had a life-threatening allergic reaction to a dose of this vaccine, to an earlier pneumococcal vaccine called PCV7 (or Prevnar), or to any vaccine containing diphtheria toxoid (for example, DTaP), should not get PCV13.  Anyone with a severe allergy to any component of PCV13 should not get the vaccine. Tell your doctor if the person being vaccinated has any severe allergies.  If the person scheduled for vaccination is sick, your doctor might decide to reschedule the shot on another day.  Your doctor can give you more information about any of these precautions.  RISKS   With any medicine, including vaccines, there is a chance of side effects. These are usually mild and go away on their own, but serious reactions are also possible.  Reported problems associated with PCV13 vary by dose and age, but generally:  · About half of children became drowsy after the shot, had a temporary loss of appetite, or had redness or tenderness where the shot was given.  · About 1 out of 3 had swelling where the shot was given.  · About 1 out of 3 had a mild fever, and about 1 in 20 had a higher fever (over 102.2° F or 39° C).  · Up to about 8   out of 10 became fussy or irritable.  Adults receiving the vaccine have reported redness, pain, and swelling where the shot was given. Mild fever, fatigue, headache, chills, or muscle pain have also been reported.  Life-threatening allergic reactions from any vaccine are very rare.  WHAT IF THERE IS A SERIOUS REACTION?  What should I look for?  Look for anything that concerns you, such as signs of a severe allergic reaction, very high  fever, or behavior changes.  Signs of a severe allergic reaction can include hives, swelling of the face and throat, difficulty breathing, a fast heartbeat, dizziness, and weakness. These would start a few minutes to a few hours after the vaccination.  What should I do?  · If you think it is a severe allergic reaction or other emergency that can't wait, get the person to the nearest hospital or call 9-1-1. Otherwise, call your doctor.  · Afterward, the reaction should be reported to the "Vaccine Adverse Event Reporting System" (VAERS). Your doctor might file this report, or you can do it yourself through the VAERS web site at www.vaers.hhs.gov, or by calling 1-800-822-7967.  VAERS is only for reporting reactions. They do not give medical advice.  THE NATIONAL VACCINE INJURY COMPENSATION PROGRAM  The National Vaccine Injury Compensation Program (VICP) was created in 1986.  Persons who believe they may have been injured by a vaccine can learn about the program and about filing a claim by calling 1-800-338-2382 or visiting the VICP website at www.hrsa.gov/vaccinecompensation.  HOW CAN I LEARN MORE?  · Ask your doctor.  · Call your local or state health department.  · Contact the Centers for Disease Control and Prevention (CDC):  ¨ Call 1-800-232-4636 (1-800-CDC-INFO) or  ¨ Visit CDC's website at www.cdc.gov/vaccines  CDC PCV13 Vaccine VIS (Interim) (11/25/11)  Document Released: 07/12/2006 Document Revised: 01/09/2013 Document Reviewed: 01/04/2013  [employer]® Patient Information ©2015 [employer]. This information is not intended to replace advice given to you by your health care provider. Make sure you discuss any questions you have with your health care provider.

## 2014-04-02 NOTE — Progress Notes (Addendum)
Subjective:  This chart was scribed for  Sergio Forts, MD  by Stacy Gardner, [facility] Scribe. The patient was seen in room and the patient's care was started at 1:52 PM.  Patient ID: Sergio [name], male    DOB: 1933-04-26, 80 y.o.   MRN: [id number]  04/02/2014  Diabetes and Hypertension   HPI HPI Comments: Sergio [name] is a 80 y.o. male who arrives to the [facility] complaining of DM, PE, hyperlipidemia and HTN. He has elevated BP of 181/86 taken in exam room today. He is under the care of Dr. Everlene Farrier and Dr. Joseph Art. Pt need a refill of Coreg bid, Glipizide once a day, and metformin once a day. He has not taken his HTN medication in two days. He takes the metformin after his breakfast everyday. He also takes low dose Aspirin daily. He reports that he is trying to lose weight and he has gone down two "holes on my belt". He had a BP reading of 181 and which is what he explains as "up and down" BP . He had a PE in his left lung and was hospitalized for treatment. Denies ever having a TIA or MI. Denies any allergies.  Denies hearing loss and tinnitus. Denies chest pain. He denies abdominal pain, constipation and diarrhea. Denies heart burn. Denies numbness and tingling to his feet. He goes to the restroom 3-4 times during the night.   Pt is a widow twice. He is not dating. He has three children, three grandchildren and three great grandchildren. He lives with his son. He smoked cigars and cigarettes and quit in 77'. Pt does not drink alcohol. Denies ever falling. He does not use a cane to ambulate. Pt does not feel sad or dysphoric. He lives active independent lifestyle and walks everyday at the park. He is retired. Pt drives and goes to the grocery store. His daughter in law does his laundry.He has not been to optometrist and does not have one yet. He has elongated toenail and explains that he has not seen a podiatrist in a while.    Review of Systems    Constitutional: Positive for unexpected weight change. Negative for fever, chills, diaphoresis, activity change, appetite change and fatigue.  HENT: Negative for hearing loss and tinnitus.   Eyes: Negative for photophobia and visual disturbance.  Respiratory: Negative for cough and shortness of breath.   Cardiovascular: Negative for chest pain, palpitations and leg swelling.  Gastrointestinal: Negative for nausea, vomiting, abdominal pain, diarrhea and constipation.  Endocrine: Negative for cold intolerance, heat intolerance, polydipsia, polyphagia and polyuria.  Genitourinary: Positive for frequency.  Skin: Negative for color change, pallor, rash and wound.  Allergic/Immunologic: Negative for environmental allergies and food allergies.  Neurological: Negative for dizziness, tremors, seizures, syncope, facial asymmetry, speech difficulty, weakness, light-headedness, numbness and headaches.  Psychiatric/Behavioral: Negative for dysphoric mood.    Past Medical History  Diagnosis Date  . Diabetes mellitus   . Hypertension   . Other and unspecified hyperlipidemia   . PE (pulmonary embolism)   History reviewed. No pertinent past surgical history.  No Known Allergies Current Outpatient Prescriptions  Medication Sig Dispense Refill  . aspirin 81 MG tablet Take 81 mg by mouth daily.      . carvedilol (COREG) 12.5 MG tablet Take 1 tablet (12.5 mg total) by mouth 2 (two) times daily. PATIENT NEEDS OFFICE VISIT FOR ADDITIONAL REFILLS  60 tablet  0  . glipiZIDE (GLUCOTROL XL) 10 MG  24 hr tablet Take 1 tablet (10 mg total) by mouth daily. PATIENT NEEDS OFFICE VISIT FOR ADDITIONAL REFILLS  30 tablet  0  . OVER THE COUNTER MEDICATION Blood pressure factors  One pill per day      . OVER THE COUNTER MEDICATION Glucose support One pill per day      . carvedilol (COREG) 12.5 MG tablet Take 1 tablet (12.5 mg total) by mouth 2 (two) times daily with a meal.  60 tablet  5  . glipiZIDE (GLUCOTROL XL) 10  MG 24 hr tablet Take 1 tablet (10 mg total) by mouth daily with breakfast.  30 tablet  5  . ketoconazole (NIZORAL) 2 % cream Apply topically daily.  30 g  0  . lisinopril-hydrochlorothiazide (PRINZIDE,ZESTORETIC) 10-12.5 MG per tablet Take 1 tablet by mouth daily.  30 tablet  5  . metFORMIN (GLUCOPHAGE) 500 MG tablet Take 2 tablets (1,000 mg total) by mouth 2 (two) times daily with a meal.  180 tablet  5  . sitaGLIPtin (JANUVIA) 100 MG tablet Take 1 tablet (100 mg total) by mouth daily.  30 tablet  5   No current facility-administered medications for this visit.   History   Social History  . Marital Status: Single    Spouse Name: N/A    Number of Children: N/A  . Years of Education: N/A   Occupational History  . Not on file.   Social History Main Topics  . Smoking status: Former Smoker -- 17 years    Types: Cigars    Quit date: 09/29/1975  . Smokeless tobacco: Not on file  . Alcohol Use: No  . Drug Use: No  . Sexual Activity: Not Currently   Other Topics Concern  . Not on file   Social History Narrative   Marital status: widowed x 2; married x 22 years first marriage; second marriage x 35.  Not dating.      Children:  3 children; 3 grandchildren; 3 great-grandchildren.      Lives: with son      Employment: retired in 2012; truck Geophysicist/field seismologist      Tobacco: quit in 1977; cigars and pipes      Alcohol: none      Exercise:  Walking 3 days per week in park.      ADLs: drives; walks unassisted; daughter-in-law does laundry; goes to grocery store.   History reviewed. No pertinent family history.     Objective:    BP 181/86  Pulse 61  Temp(Src) 98.3 F (36.8 C) (Oral)  Resp 18  Ht 5' 1.5" (1.562 m)  Wt 192 lb 9.6 oz (87.363 kg)  BMI 35.81 kg/m2  SpO2 98% DIAGNOSTIC STUDIES: Oxygen Saturation is 98% on room air, normal by my interpretation.    COORDINATION OF CARE:  1:52 PM Discussed course of care with pt which includes a pneumonia vaccination, . Pt understands and  agrees.   Physical Exam  Nursing note and vitals reviewed. Constitutional: He is oriented to person, place, and time. He appears well-developed and well-nourished. No distress.  HENT:  Head: Normocephalic and atraumatic.  Right Ear: External ear normal.  Left Ear: External ear normal.  Nose: Nose normal.  Mouth/Throat: Oropharynx is clear and moist.  Eyes: Conjunctivae and EOM are normal. Pupils are equal, round, and reactive to light.  Neck: Normal range of motion. Neck supple. Carotid bruit is not present. No tracheal deviation present. No thyromegaly present.  No carotid bruits  Cardiovascular: Normal rate, regular rhythm,  normal heart sounds and intact distal pulses.  Exam reveals no gallop and no friction rub.   No murmur heard. Pulmonary/Chest: Effort normal and breath sounds normal. No respiratory distress. He has no wheezes. He has no rales.  Abdominal: Soft. Bowel sounds are normal. He exhibits no distension and no mass. There is no tenderness. There is no rebound and no guarding.  Musculoskeletal: Normal range of motion.  Lymphadenopathy:    He has no cervical adenopathy.  Neurological: He is alert and oriented to person, place, and time. No cranial nerve deficit.  Skin: Skin is warm and dry. No rash noted. He is not diaphoretic. No erythema. No pallor.  Psychiatric: He has a normal mood and affect. His behavior is normal.   Results for orders placed in visit on 04/02/14  MICROALBUMIN, URINE      Result Value Ref Range   Microalb, Ur 21.36 (*) 0.00 - 1.89 mg/dL  CBC WITH DIFFERENTIAL      Result Value Ref Range   WBC 5.7  4.0 - 10.5 K/uL   RBC 4.96  4.22 - 5.81 MIL/uL   Hemoglobin 13.7  13.0 - 17.0 g/dL   HCT 39.7  39.0 - 52.0 %   MCV 80.0  78.0 - 100.0 fL   MCH 27.6  26.0 - 34.0 pg   MCHC 34.5  30.0 - 36.0 g/dL   RDW 15.1  11.5 - 15.5 %   Platelets 226  150 - 400 K/uL   Neutrophils Relative % 49  43 - 77 %   Neutro Abs 2.8  1.7 - 7.7 K/uL   Lymphocytes Relative  40  12 - 46 %   Lymphs Abs 2.3  0.7 - 4.0 K/uL   Monocytes Relative 7  3 - 12 %   Monocytes Absolute 0.4  0.1 - 1.0 K/uL   Eosinophils Relative 4  0 - 5 %   Eosinophils Absolute 0.2  0.0 - 0.7 K/uL   Basophils Relative 0  0 - 1 %   Basophils Absolute 0.0  0.0 - 0.1 K/uL   Smear Review Criteria for review not met    COMPLETE METABOLIC PANEL WITH GFR      Result Value Ref Range   Sodium 134 (*) 135 - 145 mEq/L   Potassium 4.0  3.5 - 5.3 mEq/L   Chloride 102  96 - 112 mEq/L   CO2 24  19 - 32 mEq/L   Glucose, Bld 233 (*) 70 - 99 mg/dL   BUN 14  6 - 23 mg/dL   Creat 1.18  0.50 - 1.35 mg/dL   Total Bilirubin 0.6  0.2 - 1.2 mg/dL   Alkaline Phosphatase 47  39 - 117 U/L   AST 22  0 - 37 U/L   ALT 31  0 - 53 U/L   Total Protein 7.3  6.0 - 8.3 g/dL   Albumin 3.7  3.5 - 5.2 g/dL   Calcium 9.2  8.4 - 10.5 mg/dL   GFR, Est African American 67     GFR, Est Non African American 58 (*)   POCT GLYCOSYLATED HEMOGLOBIN (HGB A1C)      Result Value Ref Range   Hemoglobin A1C 13.2    POCT URINALYSIS DIPSTICK      Result Value Ref Range   Color, UA yellow     Clarity, UA clear     Glucose, UA 250     Bilirubin, UA neg     Ketones, UA neg  Spec Grav, UA 1.010     Blood, UA neg     pH, UA 6.0     Protein, UA 100     Urobilinogen, UA 0.2     Nitrite, UA neg     Leukocytes, UA Negative         Assessment & Plan:  Type II or unspecified type diabetes mellitus without mention of complication, not stated as uncontrolled - Plan: POCT glycosylated hemoglobin (Hb A1C), CBC with Differential, COMPLETE METABOLIC PANEL WITH GFR, POCT urinalysis dipstick  Essential hypertension, benign - Plan: Microalbumin, urine, POCT urinalysis dipstick  Essential hypertension - Plan: lisinopril-hydrochlorothiazide (PRINZIDE,ZESTORETIC) 10-12.5 MG per tablet  Other and unspecified hyperlipidemia  1.  DMII: uncontrolled; increase Metformin to 2086m daily; add Januvia daily.  RTC three months.   2.  HTN:  uncontrolled with recent non-compliance with medication; refills provided.  3. Hyperlipidemia:  Uncontrolled due to non-compliance with medication; will need fasting labs at next visit.  Meds ordered this encounter  Medications  . OVER THE COUNTER MEDICATION    Sig: Blood pressure factors  One pill per day  . OVER THE COUNTER MEDICATION    Sig: Glucose support One pill per day  . carvedilol (COREG) 12.5 MG tablet    Sig: Take 1 tablet (12.5 mg total) by mouth 2 (two) times daily with a meal.    Dispense:  60 tablet    Refill:  5  . glipiZIDE (GLUCOTROL XL) 10 MG 24 hr tablet    Sig: Take 1 tablet (10 mg total) by mouth daily with breakfast.    Dispense:  30 tablet    Refill:  5  . DISCONTD: metFORMIN (GLUCOPHAGE-XR) 500 MG 24 hr tablet    Sig: Take 1 tablet (500 mg total) by mouth daily with breakfast.    Dispense:  30 tablet    Refill:  5  . lisinopril-hydrochlorothiazide (PRINZIDE,ZESTORETIC) 10-12.5 MG per tablet    Sig: Take 1 tablet by mouth daily.    Dispense:  30 tablet    Refill:  5  . DISCONTD: metFORMIN (GLUMETZA) 1000 MG (MOD) 24 hr tablet    Sig: Take 1 tablet (1,000 mg total) by mouth daily with breakfast.    Dispense:  30 tablet    Refill:  5    TO REPLACE 500MG OF METFORMIN ER DAILY.  .Marland KitchenDISCONTD: metFORMIN (GLUMETZA) 1000 MG (MOD) 24 hr tablet    Sig: Take 1 tablet (1,000 mg total) by mouth 2 (two) times daily with a meal.    Dispense:  60 tablet    Refill:  5    TO REPLACE 500MG OF METFORMIN ER DAILY.  . sitaGLIPtin (JANUVIA) 100 MG tablet    Sig: Take 1 tablet (100 mg total) by mouth daily.    Dispense:  30 tablet    Refill:  5    Return in about 3 months (around 07/03/2014) for recheck diabetes, high blood pressure.   I personally performed the services described in this documentation, which was scribed in my presence.  The recorded information has been reviewed and is accurate.  KReginia [name] M.D.  [facility]  [phone]

## 2014-04-03 LAB — COMPLETE METABOLIC PANEL WITH GFR
ALT: 31 U/L (ref 0–53)
AST: 22 U/L (ref 0–37)
Albumin: 3.7 g/dL (ref 3.5–5.2)
Alkaline Phosphatase: 47 U/L (ref 39–117)
BUN: 14 mg/dL (ref 6–23)
CO2: 24 mEq/L (ref 19–32)
Calcium: 9.2 mg/dL (ref 8.4–10.5)
Chloride: 102 mEq/L (ref 96–112)
Creat: 1.18 mg/dL (ref 0.50–1.35)
GFR, Est African American: 67 mL/min
GFR, Est Non African American: 58 mL/min — ABNORMAL LOW
Glucose, Bld: 233 mg/dL — ABNORMAL HIGH (ref 70–99)
Potassium: 4 mEq/L (ref 3.5–5.3)
Sodium: 134 mEq/L — ABNORMAL LOW (ref 135–145)
Total Bilirubin: 0.6 mg/dL (ref 0.2–1.2)
Total Protein: 7.3 g/dL (ref 6.0–8.3)

## 2014-04-03 LAB — MICROALBUMIN, URINE: Microalb, Ur: 21.36 mg/dL — ABNORMAL HIGH (ref 0.00–1.89)

## 2014-04-05 MED ORDER — SITAGLIPTIN PHOSPHATE 100 MG PO TABS: 100.0000 mg | ORAL_TABLET | Freq: Every day | ORAL | Status: DC

## 2014-04-05 MED ORDER — METFORMIN HCL ER (MOD) 1000 MG PO TB24: 1000.0000 mg | ORAL_TABLET | Freq: Two times a day (BID) | ORAL | Status: DC

## 2014-04-06 ENCOUNTER — Telehealth: Payer: Self-pay | Admitting: *Deleted

## 2014-04-06 NOTE — Telephone Encounter (Signed)
Received a fax from the pharmacy. Pt does not have insurance and the rx for metformin is too expensive. Can they change Metformin 1000mg  24hr tablet to Metformin 500mg  2 PO BID?

## 2014-04-09 MED ORDER — METFORMIN HCL 500 MG PO TABS: 1000.0000 mg | ORAL_TABLET | Freq: Two times a day (BID) | ORAL | Status: DC

## 2014-04-09 NOTE — Telephone Encounter (Signed)
Glumetza (Metformin ER) is $404 and Glucophage (Metformin) is only $10, Januvia is $383. They have no ins on file for pt

## 2014-04-09 NOTE — Telephone Encounter (Signed)
Call [facility] ---- patient has Medicare; does he not have medication coverage?  I escribed over Metformin 500mg  two bid.  How much is Januvia going to cost patient?

## 2014-04-12 NOTE — Telephone Encounter (Signed)
No vm set up. 

## 2014-04-12 NOTE — Telephone Encounter (Signed)
Call patient --- I sent in a more affordable Metformin.  Pt needs more diabetes medication but many of them are expensive. Recommend follow-up appointment with me in one month to check sugars on current medication; I will also send message to scheduling to call pt to schedule appointment with me in one month.

## 2014-04-13 NOTE — Telephone Encounter (Signed)
No answer and no voicemail set up  

## 2014-04-14 NOTE — Telephone Encounter (Signed)
No answer and no voicemail set up  

## 2014-04-15 NOTE — Telephone Encounter (Signed)
Unable to reach letter sent

## 2014-04-16 DIAGNOSIS — I10 Essential (primary) hypertension: Secondary | ICD-10-CM | POA: Diagnosis not present

## 2014-04-16 DIAGNOSIS — Z86010 Personal history of colonic polyps: Secondary | ICD-10-CM | POA: Diagnosis not present

## 2014-04-16 DIAGNOSIS — E119 Type 2 diabetes mellitus without complications: Secondary | ICD-10-CM | POA: Diagnosis not present

## 2014-05-30 DIAGNOSIS — I10 Essential (primary) hypertension: Secondary | ICD-10-CM | POA: Diagnosis not present

## 2014-05-30 DIAGNOSIS — Z86010 Personal history of colonic polyps: Secondary | ICD-10-CM | POA: Diagnosis not present

## 2014-06-21 DIAGNOSIS — Z1211 Encounter for screening for malignant neoplasm of colon: Secondary | ICD-10-CM | POA: Diagnosis not present

## 2014-06-21 DIAGNOSIS — D126 Benign neoplasm of colon, unspecified: Secondary | ICD-10-CM | POA: Diagnosis not present

## 2014-06-21 DIAGNOSIS — K5730 Diverticulosis of large intestine without perforation or abscess without bleeding: Secondary | ICD-10-CM | POA: Diagnosis not present

## 2014-06-21 DIAGNOSIS — Z86010 Personal history of colonic polyps: Secondary | ICD-10-CM | POA: Diagnosis not present

## 2014-06-21 LAB — HM COLONOSCOPY

## 2014-06-22 DIAGNOSIS — K635 Polyp of colon: Secondary | ICD-10-CM

## 2014-06-22 HISTORY — PX: COLONOSCOPY W/ POLYPECTOMY: SHX1380

## 2014-06-22 HISTORY — DX: Polyp of colon: K63.5

## 2014-07-04 ENCOUNTER — Ambulatory Visit (INDEPENDENT_AMBULATORY_CARE_PROVIDER_SITE_OTHER): Payer: Medicare Other | Admitting: Family Medicine

## 2014-07-04 ENCOUNTER — Encounter: Payer: Self-pay | Admitting: Family Medicine

## 2014-07-04 VITALS — BP 156/88 | HR 86 | Temp 97.9°F | Resp 18 | Ht 61.0 in | Wt 184.2 lb

## 2014-07-04 DIAGNOSIS — Z23 Encounter for immunization: Secondary | ICD-10-CM | POA: Diagnosis not present

## 2014-07-04 DIAGNOSIS — K635 Polyp of colon: Secondary | ICD-10-CM | POA: Insufficient documentation

## 2014-07-04 DIAGNOSIS — B351 Tinea unguium: Secondary | ICD-10-CM | POA: Diagnosis not present

## 2014-07-04 DIAGNOSIS — E7800 Pure hypercholesterolemia, unspecified: Secondary | ICD-10-CM

## 2014-07-04 DIAGNOSIS — I10 Essential (primary) hypertension: Secondary | ICD-10-CM | POA: Diagnosis not present

## 2014-07-04 DIAGNOSIS — E119 Type 2 diabetes mellitus without complications: Secondary | ICD-10-CM | POA: Diagnosis not present

## 2014-07-04 LAB — CBC WITH DIFFERENTIAL/PLATELET
Basophils Absolute: 0 10*3/uL (ref 0.0–0.1)
Basophils Relative: 0 % (ref 0–1)
Eosinophils Absolute: 0.1 10*3/uL (ref 0.0–0.7)
Eosinophils Relative: 2 % (ref 0–5)
HCT: 40.1 % (ref 39.0–52.0)
Hemoglobin: 13.6 g/dL (ref 13.0–17.0)
Lymphocytes Relative: 36 % (ref 12–46)
Lymphs Abs: 2.3 10*3/uL (ref 0.7–4.0)
MCH: 27.9 pg (ref 26.0–34.0)
MCHC: 33.9 g/dL (ref 30.0–36.0)
MCV: 82.2 fL (ref 78.0–100.0)
Monocytes Absolute: 0.4 10*3/uL (ref 0.1–1.0)
Monocytes Relative: 6 % (ref 3–12)
Neutro Abs: 3.6 10*3/uL (ref 1.7–7.7)
Neutrophils Relative %: 56 % (ref 43–77)
Platelets: 245 10*3/uL (ref 150–400)
RBC: 4.88 MIL/uL (ref 4.22–5.81)
RDW: 15.7 % — ABNORMAL HIGH (ref 11.5–15.5)
WBC: 6.4 10*3/uL (ref 4.0–10.5)

## 2014-07-04 LAB — LIPID PANEL
Cholesterol: 135 mg/dL (ref 0–200)
HDL: 42 mg/dL (ref >39)
LDL Cholesterol: 62 mg/dL (ref 0–99)
Total CHOL/HDL Ratio: 3.2 Ratio
Triglycerides: 153 mg/dL — ABNORMAL HIGH (ref <150)
VLDL: 31 mg/dL (ref 0–40)

## 2014-07-04 LAB — COMPREHENSIVE METABOLIC PANEL
ALT: 21 U/L (ref 0–53)
AST: 22 U/L (ref 0–37)
Albumin: 3.9 g/dL (ref 3.5–5.2)
Alkaline Phosphatase: 51 U/L (ref 39–117)
BUN: 15 mg/dL (ref 6–23)
CO2: 30 mEq/L (ref 19–32)
Calcium: 9.4 mg/dL (ref 8.4–10.5)
Chloride: 101 mEq/L (ref 96–112)
Creat: 1.43 mg/dL — ABNORMAL HIGH (ref 0.50–1.35)
Glucose, Bld: 160 mg/dL — ABNORMAL HIGH (ref 70–99)
Potassium: 3.9 mEq/L (ref 3.5–5.3)
Sodium: 138 mEq/L (ref 135–145)
Total Bilirubin: 0.6 mg/dL (ref 0.2–1.2)
Total Protein: 8 g/dL (ref 6.0–8.3)

## 2014-07-04 LAB — POCT GLYCOSYLATED HEMOGLOBIN (HGB A1C): Hemoglobin A1C: 8.7

## 2014-07-04 LAB — GLUCOSE, POCT (MANUAL RESULT ENTRY): POC Glucose: 166 mg/dl — AB (ref 70–99)

## 2014-07-04 MED ORDER — METFORMIN HCL 1000 MG PO TABS: 1000.0000 mg | ORAL_TABLET | Freq: Two times a day (BID) | ORAL | Status: DC

## 2014-07-04 MED ORDER — CARVEDILOL 12.5 MG PO TABS: 12.5000 mg | ORAL_TABLET | Freq: Two times a day (BID) | ORAL | Status: DC

## 2014-07-04 MED ORDER — KETOCONAZOLE 2 % EX CREA: TOPICAL_CREAM | Freq: Two times a day (BID) | CUTANEOUS | Status: DC

## 2014-07-04 MED ORDER — GLIPIZIDE ER 10 MG PO TB24: 10.0000 mg | ORAL_TABLET | Freq: Every day | ORAL | Status: DC

## 2014-07-04 MED ORDER — LISINOPRIL-HYDROCHLOROTHIAZIDE 10-12.5 MG PO TABS: 1.0000 | ORAL_TABLET | Freq: Every day | ORAL | Status: DC

## 2014-07-04 MED ORDER — KETOCONAZOLE 2 % EX CREA: TOPICAL_CREAM | Freq: Two times a day (BID) | CUTANEOUS | Status: AC

## 2014-07-04 NOTE — Progress Notes (Signed)
Subjective:    Patient ID: Sergio [name], male    DOB: 1932/11/10, 80 y.o.   MRN: [id number]  HPI  Sergio [name] is a 80 y.o. male presenting for 3 month follow up on HTN, DM and HL.   DM - taking Metformin and Glipizide, reports taking only 1 Metformin and Glipizide in the am. Denies adverse effects from medications including metallic taste, GI disturbance, feeling lethargic or fatigued. Eating a healthy diet including vegetables, fruits, beans, avoids carb heavy foods and sweets. Denies alcohol or tobacco use. Reports checking his feet regularly, denies any ulcers or wounds that won't heal. Has not had foot care performed in a while. Last eye care visit was more than 2 years ago, denies vision changes including blurred vision. Denies polyphagia, polydipsia, polyuria, numbness, tingling or burning sensations in lower extremities.    HTN - taking 2 Carvedilol in the am. Avoids added salt, diet as above. Patient stays busy with church, community groups, occasionally "moves furniture", tries to walk daily but admits he could walk more. Denies lightheadedness, dizziness, lower extremity edema.  HL - not taking a cholesterol medication. Patient states that it was one of the medications that he could not afford. Diet and exercise as above.  Health Maintenance: colonoscopy performed about 2 weeks ago. Findings included 4 polyps, polypectomy performed, he was advised to return for repeat colonoscopy in 3 years. Denies blood in his stool.  Denies any other aggravating or relieving factors. Denies any other questions or concerns.   Review of Systems As in subjective.    Objective:   Physical Exam  Vitals reviewed. Constitutional: He is oriented to person, place, and time. He appears well-developed and well-nourished. No distress.  BP 156/88  Pulse 86  Temp(Src) 97.9 F (36.6 C) (Oral)  Resp 18  Ht 5\' 1"  (1.549 m)  Wt 184 lb 3.2 oz (83.553 kg)  BMI 34.82 kg/m2  SpO2 97%   Neck: Neck supple.  No thyromegaly present.  Cardiovascular: Normal rate, regular rhythm and normal heart sounds.  Exam reveals no gallop and no friction rub.   No murmur heard. Radial pulses 2+ bilaterally, Posterior tibial and Dorsalis Pedis 1+ bilaterally  Pulmonary/Chest: Effort normal and breath sounds normal. He has no wheezes. He has no rales.  Abdominal: Soft. Bowel sounds are normal. He exhibits no mass. There is no tenderness.  Truncal obesity  Musculoskeletal: Normal range of motion. He exhibits edema (1+ pitting edema bilaterally in lower extremities). He exhibits no tenderness.  Neurological: He is alert and oriented to person, place, and time. A sensory deficit (sensation decreased bilaterally in 1st big toe) is present.  Skin: Skin is warm and dry. Rash (dry, scaly skin bilaterally in lower extremities, greater toe nails ) noted. He is not diaphoretic.  Psychiatric: He has a normal mood and affect. His behavior is normal.   Results for orders placed in visit on 07/04/14 (from the past 24 hour(s))  POCT GLYCOSYLATED HEMOGLOBIN (HGB A1C)     Status: None   Collection Time    07/04/14  8:45 AM      Result Value Ref Range   Hemoglobin A1C 8.7    GLUCOSE, POCT (MANUAL RESULT ENTRY)     Status: Abnormal   Collection Time    07/04/14  8:45 AM      Result Value Ref Range   POC Glucose 166 (*) 70 - 99 mg/dl      Assessment & Plan:   Sergio [name] is a pleasant  80 y.o. male presenting today for 3 month follow up on his DM, HTN and HL. Overall, Sergio [name] is significantly improved. Suspect that medication compliance was the primary issue for elevated labs at previous visit on 03/2014. Discussed with patient medications that he should be on and how to take them. Plan for CPE in 3 months. Plan for specific conditions below.  1. Diabetes mellitus without complication: not at goal although A1C significantly improved today, fasting Glucose elevated. - Continue metformin and glipizide, continue healthy diet  and exercise. - Restart ketoconazole cream for fungal onychomycosis in great toe.   - Consider referral for diabetic foot care. - Repeat labs in 3 months. - POCT glycosylated hemoglobin (Hb A1C) - POCT glucose (manual entry) - HM Diabetes Foot Exam - glipiZIDE (GLUCOTROL XL) 10 MG 24 hr tablet; Take 1 tablet (10 mg total) by mouth daily with breakfast.  Dispense: 30 tablet; Refill: 5 - metFORMIN (GLUCOPHAGE) 1000 MG tablet; Take 1 tablet (1,000 mg total) by mouth 2 (two) times daily with a meal.  Dispense: 60 tablet; Refill: 5 - CBC with Differential - Comprehensive metabolic panel - Lipid panel  Health Maintenance 2. Need for prophylactic vaccination and inoculation against influenza 3. Colonic polyps - Continue follow w/GI for colon polyps - Flu Vaccine QUAD 36+ mos IM  4. Essential hypertension: not at goal although BP significantly improved today - Continue carvedilol and restart lis-HCTZ - carvedilol (COREG) 12.5 MG tablet; Take 1 tablet (12.5 mg total) by mouth 2 (two) times daily with a meal.  Dispense: 60 tablet; Refill: 5 - lisinopril-hydrochlorothiazide (PRINZIDE,ZESTORETIC) 10-12.5 MG per tablet; Take 1 tablet by mouth daily.  Dispense: 30 tablet; Refill: 5 - Lipid panel  5. Onychomycosis - ketoconazole (NIZORAL) 2 % cream; Apply topically 2 (two) times daily.  Dispense: 60 g; Refill: 1  6. Pure hypercholesterolemia: well controlled per last lipid panel (04/2013).  - Repeat lipid panel   Jaynee Eagles, PA-C [facility] [phone] 07/04/2014 1:22 PM

## 2014-07-04 NOTE — Progress Notes (Signed)
History and physical examinations obtained with Rosario Adie, PA-C. Agree with assessment and plan as outlined.  Will refer to ophthalmology for diabetic eye exam.

## 2014-07-06 ENCOUNTER — Encounter: Payer: Self-pay | Admitting: *Deleted

## 2014-07-12 ENCOUNTER — Encounter: Payer: Self-pay | Admitting: Radiology

## 2014-07-23 DIAGNOSIS — H25813 Combined forms of age-related cataract, bilateral: Secondary | ICD-10-CM | POA: Diagnosis not present

## 2014-07-23 DIAGNOSIS — E119 Type 2 diabetes mellitus without complications: Secondary | ICD-10-CM | POA: Diagnosis not present

## 2014-07-23 LAB — HM DIABETES EYE EXAM

## 2014-08-09 DIAGNOSIS — H25812 Combined forms of age-related cataract, left eye: Secondary | ICD-10-CM | POA: Diagnosis not present

## 2014-08-09 DIAGNOSIS — H2512 Age-related nuclear cataract, left eye: Secondary | ICD-10-CM | POA: Diagnosis not present

## 2014-09-24 ENCOUNTER — Encounter: Payer: Self-pay | Admitting: *Deleted

## 2014-09-25 ENCOUNTER — Telehealth: Payer: Self-pay

## 2014-09-25 DIAGNOSIS — E119 Type 2 diabetes mellitus without complications: Secondary | ICD-10-CM

## 2014-09-25 NOTE — Telephone Encounter (Signed)
Pt needs a referral to a podiatrist for nail trimming

## 2014-09-26 NOTE — Telephone Encounter (Signed)
Approved referral for podiatry for diabetic foot care.

## 2014-09-26 NOTE — Telephone Encounter (Signed)
Please advise if podiatrist referral is ok for diabetic foot care. Referral pending.

## 2014-10-03 ENCOUNTER — Encounter: Payer: Medicare Other | Admitting: Family Medicine

## 2014-12-20 ENCOUNTER — Encounter: Payer: Self-pay | Admitting: Family Medicine

## 2015-02-12 ENCOUNTER — Encounter (HOSPITAL_COMMUNITY): Payer: Self-pay | Admitting: Emergency Medicine

## 2015-02-12 ENCOUNTER — Emergency Department (HOSPITAL_COMMUNITY)
Admission: EM | Admit: 2015-02-12 | Discharge: 2015-02-12 | Disposition: A | Discharge status: Home / Self Care | Payer: PPO | Attending: Emergency Medicine | Admitting: Emergency Medicine

## 2015-02-12 DIAGNOSIS — Z86010 Personal history of colonic polyps: Secondary | ICD-10-CM | POA: Diagnosis not present

## 2015-02-12 DIAGNOSIS — E860 Dehydration: Secondary | ICD-10-CM | POA: Insufficient documentation

## 2015-02-12 DIAGNOSIS — E1165 Type 2 diabetes mellitus with hyperglycemia: Secondary | ICD-10-CM | POA: Insufficient documentation

## 2015-02-12 DIAGNOSIS — Z86711 Personal history of pulmonary embolism: Secondary | ICD-10-CM | POA: Diagnosis not present

## 2015-02-12 DIAGNOSIS — I10 Essential (primary) hypertension: Secondary | ICD-10-CM | POA: Insufficient documentation

## 2015-02-12 DIAGNOSIS — Z87891 Personal history of nicotine dependence: Secondary | ICD-10-CM | POA: Diagnosis not present

## 2015-02-12 DIAGNOSIS — Z7982 Long term (current) use of aspirin: Secondary | ICD-10-CM | POA: Insufficient documentation

## 2015-02-12 DIAGNOSIS — Z79899 Other long term (current) drug therapy: Secondary | ICD-10-CM | POA: Insufficient documentation

## 2015-02-12 DIAGNOSIS — R739 Hyperglycemia, unspecified: Secondary | ICD-10-CM

## 2015-02-12 LAB — URINALYSIS, ROUTINE W REFLEX MICROSCOPIC
Bilirubin Urine: NEGATIVE
Glucose, UA: >1000 mg/dL — AB
Hgb urine dipstick: NEGATIVE
Ketones, ur: NEGATIVE mg/dL
Leukocytes, UA: NEGATIVE
Nitrite: NEGATIVE
Protein, ur: 30 mg/dL — AB
Specific Gravity, Urine: 1.025 (ref 1.005–1.030)
Urobilinogen, UA: 0.2 mg/dL (ref 0.0–1.0)
pH: 6 (ref 5.0–8.0)

## 2015-02-12 LAB — COMPREHENSIVE METABOLIC PANEL
ALT: 25 U/L (ref 17–63)
AST: 22 U/L (ref 15–41)
Albumin: 3.6 g/dL (ref 3.5–5.0)
Alkaline Phosphatase: 59 U/L (ref 38–126)
Anion gap: 11 (ref 5–15)
BUN: 14 mg/dL (ref 6–20)
CO2: 26 mmol/L (ref 22–32)
Calcium: 9.2 mg/dL (ref 8.9–10.3)
Chloride: 94 mmol/L — ABNORMAL LOW (ref 101–111)
Creatinine, Ser: 1.6 mg/dL — ABNORMAL HIGH (ref 0.61–1.24)
GFR calc Af Amer: 45 mL/min — ABNORMAL LOW (ref >60)
GFR calc non Af Amer: 39 mL/min — ABNORMAL LOW (ref >60)
Glucose, Bld: 515 mg/dL — ABNORMAL HIGH (ref 65–99)
Potassium: 3.8 mmol/L (ref 3.5–5.1)
Sodium: 131 mmol/L — ABNORMAL LOW (ref 135–145)
Total Bilirubin: 0.9 mg/dL (ref 0.3–1.2)
Total Protein: 7.9 g/dL (ref 6.5–8.1)

## 2015-02-12 LAB — URINE MICROSCOPIC-ADD ON

## 2015-02-12 LAB — CBG MONITORING, ED
Glucose-Capillary: 506 mg/dL — ABNORMAL HIGH (ref 65–99)
Glucose-Capillary: 388 mg/dL — ABNORMAL HIGH (ref 65–99)

## 2015-02-12 LAB — CBC
HCT: 42.5 % (ref 39.0–52.0)
Hemoglobin: 14.6 g/dL (ref 13.0–17.0)
MCH: 27.8 pg (ref 26.0–34.0)
MCHC: 34.4 g/dL (ref 30.0–36.0)
MCV: 81 fL (ref 78.0–100.0)
Platelets: 230 10*3/uL (ref 150–400)
RBC: 5.25 MIL/uL (ref 4.22–5.81)
RDW: 13.9 % (ref 11.5–15.5)
WBC: 7.1 10*3/uL (ref 4.0–10.5)

## 2015-02-12 MED ORDER — INSULIN ASPART 100 UNIT/ML ~~LOC~~ SOLN
10.0000 [IU] | Freq: Once | SUBCUTANEOUS | Status: AC
  Administered 2015-02-12: 10 [IU] via SUBCUTANEOUS
  Filled 2015-02-12: qty 1

## 2015-02-12 MED ORDER — SODIUM CHLORIDE 0.9 % IV BOLUS (SEPSIS)
1000.0000 mL | Freq: Once | INTRAVENOUS | Status: AC
  Administered 2015-02-12: 1000 mL via INTRAVENOUS

## 2015-02-12 NOTE — ED Notes (Signed)
Pt states his daughter in law checked his blood sugar today and it was high  Pt states he has not been eating as much as he usually does as his appetite has been down lately, states he has been laying down a lot, and has been urinating more frequent

## 2015-02-12 NOTE — ED Provider Notes (Signed)
CSN: [id number]     Arrival date & time 02/12/15  2019 History   First MD Initiated Contact with Patient 02/12/15 2049     Chief Complaint  Patient presents with  . Hyperglycemia     (Consider location/radiation/quality/duration/timing/severity/associated sxs/prior Treatment) The history is provided by the patient.  Sergio [name] is a 81 y.o. male hx of DM, HTN, pulmonary embolus not on Coumadin here presenting with hyperglycemia, dehydration. He states that he has not been taking his glipizide, metformin for the last 2 days. He also has not been drinking much. He does have been urinating very frequently. Denies any vomiting or fevers or abdominal pain. He is not on insulin. Daughter-in-law check with him today and noticed that the blood sugar read high so sent him here for evaluation.   Past Medical History  Diagnosis Date  . Diabetes mellitus   . Hypertension   . Other and unspecified hyperlipidemia   . PE (pulmonary embolism)   . Colon polyps 06/22/2014    4 colon polyps by colonoscopy.  Repeat 3 years.  Hung.   Past Surgical History  Procedure Laterality Date  . Colonoscopy w/ polypectomy  06/22/2014    4 colon polyps; repeat 3 years; [place].   Family History  Problem Relation Age of Onset  . Diabetes Other    History  Substance Use Topics  . Smoking status: Former Smoker -- 17 years    Types: Cigars    Quit date: 09/29/1975  . Smokeless tobacco: Not on file  . Alcohol Use: No    Review of Systems  Constitutional: Positive for appetite change.  All other systems reviewed and are negative.     Allergies  Review of patient's allergies indicates no known allergies.  Home Medications   Prior to Admission medications   Medication Sig Start Date End Date Taking? Authorizing Provider  aspirin 81 MG tablet Take 81 mg by mouth daily.   Yes Historical Provider, MD  carvedilol (COREG) 12.5 MG tablet Take 1 tablet (12.5 mg total) by mouth 2 (two) times daily. PATIENT NEEDS  OFFICE VISIT FOR ADDITIONAL REFILLS   Yes Robyn Haber, MD  glipiZIDE (GLUCOTROL XL) 10 MG 24 hr tablet Take 1 tablet (10 mg total) by mouth daily. PATIENT NEEDS OFFICE VISIT FOR ADDITIONAL REFILLS   Yes Robyn Haber, MD  lisinopril-hydrochlorothiazide (PRINZIDE,ZESTORETIC) 10-12.5 MG per tablet Take 1 tablet by mouth daily. 07/04/14  Yes Wardell Honour, MD  metFORMIN (GLUCOPHAGE) 1000 MG tablet Take 1 tablet (1,000 mg total) by mouth 2 (two) times daily with a meal. 07/04/14  Yes Wardell Honour, MD  OVER THE COUNTER MEDICATION Blood pressure factors  One pill per day   Yes Historical Provider, MD  OVER THE COUNTER MEDICATION Glucose support One pill per day   Yes Historical Provider, MD  sitaGLIPtin (JANUVIA) 100 MG tablet Take 1 tablet (100 mg total) by mouth daily. 04/05/14  Yes Wardell Honour, MD  carvedilol (COREG) 12.5 MG tablet Take 1 tablet (12.5 mg total) by mouth 2 (two) times daily with a meal. Patient not taking: Reported on 02/12/2015 07/04/14   Wardell Honour, MD  glipiZIDE (GLUCOTROL XL) 10 MG 24 hr tablet Take 1 tablet (10 mg total) by mouth daily with breakfast. Patient not taking: Reported on 02/12/2015 07/04/14   Wardell Honour, MD   BP 120/61 mmHg  Pulse 76  Temp(Src) 97.8 F (36.6 C) (Oral)  Resp 18  Wt 190 lb (86.183 kg)  SpO2 98% Physical  Exam  Constitutional: He is oriented to person, place, and time. He appears well-developed.  Dehydrated   HENT:  Head: Normocephalic.  MM slightly dry   Eyes: Conjunctivae are normal. Pupils are equal, round, and reactive to light.  Neck: Normal range of motion. Neck supple.  Cardiovascular: Normal rate, regular rhythm and normal heart sounds.   Pulmonary/Chest: Effort normal and breath sounds normal. No respiratory distress. He has no wheezes. He has no rales.  Abdominal: Soft. Bowel sounds are normal. He exhibits no distension. There is no tenderness. There is no rebound and no guarding.  Musculoskeletal: Normal range of  motion. He exhibits no edema or tenderness.  Neurological: He is alert and oriented to person, place, and time. No cranial nerve deficit. Coordination normal.  Skin: Skin is warm and dry.  Psychiatric: He has a normal mood and affect. His behavior is normal. Judgment normal.  Nursing note and vitals reviewed.   ED Course  Procedures (including critical care time) Labs Review Labs Reviewed  COMPREHENSIVE METABOLIC PANEL - Abnormal; Notable for the following:    Sodium 131 (*)    Chloride 94 (*)    Glucose, Bld 515 (*)    Creatinine, Ser 1.60 (*)    GFR calc non Af Amer 39 (*)    GFR calc Af Amer 45 (*)    All other components within normal limits  URINALYSIS, ROUTINE W REFLEX MICROSCOPIC - Abnormal; Notable for the following:    Glucose, UA >1000 (*)    Protein, ur 30 (*)    All other components within normal limits  CBG MONITORING, ED - Abnormal; Notable for the following:    Glucose-Capillary 506 (*)    All other components within normal limits  CBG MONITORING, ED - Abnormal; Notable for the following:    Glucose-Capillary 388 (*)    All other components within normal limits  CBC  URINE MICROSCOPIC-ADD ON  CBG MONITORING, ED    Imaging Review No results found.   EKG Interpretation None      MDM   Final diagnoses:  None   Sergio [name] is a 81 y.o. male here with hyperglycemia. Will r/o DKA, but I think likely dehydration from med uncompliance.   11:19 PM Labs showed Cr 1.6 (baseline 1.4), likely dehydration. Glucose from 515 to 388 after IVF and subQ insulin. No ketones in urine and AG nl. I doubt DKA. I emphasize that he needs to take his diabetes medicines. Gave strict return precautions.     Wandra Arthurs, MD 02/12/15 [phone]

## 2015-02-12 NOTE — ED Notes (Signed)
Patient informed to notify staff when he can provide a urine specimen.

## 2015-02-12 NOTE — Discharge Instructions (Signed)
Stay hydrated.   Take your diabetes medicines as prescribed.  Check blood sugar frequently.   Repeat blood work with your doctor in a week.   Return to ER if you have vomiting, abdominal pain, dehydration.

## 2015-07-05 ENCOUNTER — Other Ambulatory Visit: Payer: Self-pay | Admitting: Family Medicine

## 2015-12-20 DIAGNOSIS — H43822 Vitreomacular adhesion, left eye: Secondary | ICD-10-CM | POA: Diagnosis not present

## 2015-12-20 DIAGNOSIS — H40031 Anatomical narrow angle, right eye: Secondary | ICD-10-CM | POA: Diagnosis not present

## 2015-12-20 DIAGNOSIS — H35352 Cystoid macular degeneration, left eye: Secondary | ICD-10-CM | POA: Diagnosis not present

## 2015-12-20 DIAGNOSIS — H25011 Cortical age-related cataract, right eye: Secondary | ICD-10-CM | POA: Diagnosis not present

## 2015-12-20 DIAGNOSIS — Z961 Presence of intraocular lens: Secondary | ICD-10-CM | POA: Diagnosis not present

## 2016-09-01 ENCOUNTER — Encounter (HOSPITAL_COMMUNITY): Payer: Self-pay | Admitting: Emergency Medicine

## 2016-09-01 ENCOUNTER — Ambulatory Visit (HOSPITAL_COMMUNITY)
Admission: EM | Admit: 2016-09-01 | Discharge: 2016-09-01 | Disposition: A | Discharge status: Home / Self Care | Payer: PPO | Attending: Family Medicine | Admitting: Family Medicine

## 2016-09-01 DIAGNOSIS — E1100 Type 2 diabetes mellitus with hyperosmolarity without nonketotic hyperglycemic-hyperosmolar coma (NKHHC): Secondary | ICD-10-CM | POA: Diagnosis not present

## 2016-09-01 DIAGNOSIS — I10 Essential (primary) hypertension: Secondary | ICD-10-CM | POA: Diagnosis not present

## 2016-09-01 MED ORDER — GLIPIZIDE ER 10 MG PO TB24: ORAL_TABLET | ORAL | 0 refills | Status: DC

## 2016-09-01 MED ORDER — METFORMIN HCL 1000 MG PO TABS: ORAL_TABLET | ORAL | 0 refills | Status: DC

## 2016-09-01 MED ORDER — LISINOPRIL-HYDROCHLOROTHIAZIDE 10-12.5 MG PO TABS: ORAL_TABLET | ORAL | 0 refills | Status: DC

## 2016-09-01 MED ORDER — CARVEDILOL 12.5 MG PO TABS: 12.5000 mg | ORAL_TABLET | Freq: Two times a day (BID) | ORAL | 0 refills | Status: DC

## 2016-09-01 MED ORDER — SITAGLIPTIN PHOSPHATE 100 MG PO TABS: 100.0000 mg | ORAL_TABLET | Freq: Every day | ORAL | 5 refills | Status: DC

## 2016-09-01 NOTE — ED Provider Notes (Signed)
CSN: [id number]     Arrival date & time 09/01/16  1204 History   First MD Initiated Contact with Patient 09/01/16 1337     Chief Complaint  Patient presents with  . Hypertension   (Consider location/radiation/quality/duration/timing/severity/associated sxs/prior Treatment) Patient states he has run out of his bp medicine and needs refills.  Patient is diabetic and states he needs refills on diabetes medicine.  He states he has not been in to see Dr Tamala Julian in some time and will make an appointment to go see her.     The history is provided by the patient.  Hypertension  This is a new problem. The problem occurs constantly. The problem has not changed since onset.Nothing aggravates the symptoms. Nothing relieves the symptoms. He has tried nothing for the symptoms.    Past Medical History:  Diagnosis Date  . Colon polyps 06/22/2014   4 colon polyps by colonoscopy.  Repeat 3 years.  Hung.  . Diabetes mellitus   . Hypertension   . Other and unspecified hyperlipidemia   . PE (pulmonary embolism)    Past Surgical History:  Procedure Laterality Date  . COLONOSCOPY W/ POLYPECTOMY  06/22/2014   4 colon polyps; repeat 3 years; [place].   Family History  Problem Relation Age of Onset  . Diabetes Other    Social History  Substance Use Topics  . Smoking status: Former Smoker    Years: 17.00    Types: Cigars    Quit date: 09/29/1975  . Smokeless tobacco: Not on file  . Alcohol use No    Review of Systems  Allergies  Patient has no known allergies.  Home Medications   Prior to Admission medications   Medication Sig Start Date End Date Taking? Authorizing Provider  aspirin 81 MG tablet Take 81 mg by mouth daily.    Historical Provider, MD  carvedilol (COREG) 12.5 MG tablet Take 1 tablet (12.5 mg total) by mouth 2 (two) times daily with a meal. Patient not taking: Reported on 09/01/2016 07/04/14   Wardell Honour, MD  carvedilol (COREG) 12.5 MG tablet Take 1 tablet (12.5 mg total) by mouth  2 (two) times daily. PATIENT NEEDS OFFICE VISIT FOR ADDITIONAL REFILLS 09/01/16   Lysbeth Penner, FNP  glipiZIDE (GLUCOTROL XL) 10 MG 24 hr tablet TAKE ONE TABLET BY MOUTH DAILY WITH BREAKFAST   "OFFICE VISIT NEEDED FOR REFILLS" 09/01/16   Lysbeth Penner, FNP  lisinopril-hydrochlorothiazide (PRINZIDE,ZESTORETIC) 10-12.5 MG tablet TAKE ONE TABLET BY MOUTH DAILY  "OFFICE VISIT NEEDED FOR REFILLS" 09/01/16   Lysbeth Penner, FNP  metFORMIN (GLUCOPHAGE) 1000 MG tablet TAKE ONE TABLET BY MOUTH TWICE DAILY WITH A MEAL  "OFFICE VISIT NEEDED FOR REFILLS" 09/01/16   Lysbeth Penner, FNP  OVER THE COUNTER MEDICATION Blood pressure factors  One pill per day    Historical Provider, MD  OVER THE COUNTER MEDICATION Glucose support One pill per day    Historical Provider, MD  sitaGLIPtin (JANUVIA) 100 MG tablet Take 1 tablet (100 mg total) by mouth daily. 09/01/16   Lysbeth Penner, FNP   Meds Ordered and Administered this Visit  Medications - No data to display  BP (!) 214/86 (BP Location: Left Arm)   Pulse 70   Temp 97.7 F (36.5 C) (Oral)   Resp 24   SpO2 98%  No data found.   Physical Exam  Urgent Care Course   Clinical Course     Procedures (including critical care time)  Labs Review Labs  Reviewed - No data to display  Imaging Review No results found.   Visual Acuity Review  Right Eye Distance:   Left Eye Distance:   Bilateral Distance:    Right Eye Near:   Left Eye Near:    Bilateral Near:         MDM   1. Essential hypertension   2. Type 2 diabetes mellitus with hyperosmolarity without coma, without long-term current use of insulin (HCC)    Refill metformin, carvedilol, glipizide, lisinopril HCT, and then call today and make follow up appointment with PcP.     Lysbeth Penner, FNP 09/01/16 [phone]

## 2016-09-01 NOTE — ED Triage Notes (Signed)
patient reports blood pressure running high and urinating more frequently for a week.  Patient is not taking meds consistently.  Reports he has no pcp.  Patient reports cbg has been running 200.  Patient is non - compliant with diet and medicines

## 2016-09-01 NOTE — Discharge Instructions (Signed)
Please call and schedule an appointment with your PCP today

## 2016-12-21 ENCOUNTER — Ambulatory Visit (INDEPENDENT_AMBULATORY_CARE_PROVIDER_SITE_OTHER): Payer: PPO | Admitting: Family Medicine

## 2016-12-21 VITALS — BP 190/99 | HR 62 | Temp 98.1°F | Resp 16 | Ht 62.0 in | Wt 168.4 lb

## 2016-12-21 DIAGNOSIS — B351 Tinea unguium: Secondary | ICD-10-CM | POA: Diagnosis not present

## 2016-12-21 DIAGNOSIS — R9431 Abnormal electrocardiogram [ECG] [EKG]: Secondary | ICD-10-CM | POA: Diagnosis not present

## 2016-12-21 DIAGNOSIS — R399 Unspecified symptoms and signs involving the genitourinary system: Secondary | ICD-10-CM | POA: Diagnosis not present

## 2016-12-21 DIAGNOSIS — E119 Type 2 diabetes mellitus without complications: Secondary | ICD-10-CM | POA: Diagnosis not present

## 2016-12-21 DIAGNOSIS — Z9114 Patient's other noncompliance with medication regimen: Secondary | ICD-10-CM | POA: Diagnosis not present

## 2016-12-21 DIAGNOSIS — I10 Essential (primary) hypertension: Secondary | ICD-10-CM

## 2016-12-21 DIAGNOSIS — Z91148 Patient's other noncompliance with medication regimen for other reason: Secondary | ICD-10-CM

## 2016-12-21 LAB — POCT URINALYSIS DIP (MANUAL ENTRY)
Bilirubin, UA: NEGATIVE
Blood, UA: NEGATIVE
Glucose, UA: NEGATIVE
Ketones, POC UA: NEGATIVE
Leukocytes, UA: NEGATIVE
Nitrite, UA: NEGATIVE
Protein Ur, POC: 100 — AB
Spec Grav, UA: 1.005 (ref 1.030–1.035)
Urobilinogen, UA: 0.2 (ref ?–2.0)
pH, UA: 5.5 (ref 5.0–8.0)

## 2016-12-21 LAB — POCT GLYCOSYLATED HEMOGLOBIN (HGB A1C): Hemoglobin A1C: 6.6

## 2016-12-21 NOTE — Progress Notes (Signed)
Chief Complaint  Patient presents with  . Hypertension    HPI   Diabetes Mellitus: Patient presents for follow up of diabetes. Symptoms: none. Symptoms have been well-controlled.  His daughter-in-law checks his sugars at home and his son who is here with him states that his sugars are good. Patient denies foot ulcerations, hyperglycemia, hypoglycemia , paresthesia of the feet, polydipsia and polyuria.  Evaluation to date has been included: hemoglobin A1C.  Home sugars: BGs consistently in an acceptable range. Treatment to date: metformin and januvia.   Lab Results  Component Value Date   HGBA1C 6.6 12/21/2016   Body mass index is 30.8 kg/m.  Hypertension: Patient here for follow-up of elevated blood pressure. He is not exercising and is not adherent to low salt diet.  Blood pressure is not well controlled at home. Cardiac symptoms none. Patient denies chest pain, dyspnea, irregular heart beat and palpitations.  Cardiovascular risk factors: advanced age (older than 46 for men, 63 for women), diabetes mellitus, hypertension and male gender. Use of agents associated with hypertension: none. History of target organ damage: none. BP Readings from Last 3 Encounters:  12/21/16 (!) 190/99  09/01/16 (!) 214/86  02/12/15 111/62   He did not take his carvedilol today and states that he has a hard time remembering He reports that he OFTEN misses his evening doses because twice a day is too hard to remember so he takes only the morning doses Today since he did not eat his breakfast he did not take his morning dose  Past Medical History:  Diagnosis Date  . Colon polyps 06/22/2014   4 colon polyps by colonoscopy.  Repeat 3 years.  Hung.  . Diabetes mellitus   . Hypertension   . Other and unspecified hyperlipidemia   . PE (pulmonary embolism)     Current Outpatient Prescriptions  Medication Sig Dispense Refill  . aspirin 81 MG tablet Take 81 mg by mouth daily.    . carvedilol (COREG) 12.5 MG  tablet Take 1 tablet (12.5 mg total) by mouth 2 (two) times daily. PATIENT NEEDS OFFICE VISIT FOR ADDITIONAL REFILLS 60 tablet 0  . glipiZIDE (GLUCOTROL XL) 10 MG 24 hr tablet TAKE ONE TABLET BY MOUTH DAILY WITH BREAKFAST   "OFFICE VISIT NEEDED FOR REFILLS" 30 tablet 0  . lisinopril-hydrochlorothiazide (PRINZIDE,ZESTORETIC) 10-12.5 MG tablet TAKE ONE TABLET BY MOUTH DAILY  "OFFICE VISIT NEEDED FOR REFILLS" 30 tablet 0  . metFORMIN (GLUCOPHAGE) 1000 MG tablet TAKE ONE TABLET BY MOUTH TWICE DAILY WITH A MEAL  "OFFICE VISIT NEEDED FOR REFILLS" 60 tablet 0  . OVER THE COUNTER MEDICATION Blood pressure factors  One pill per day    . OVER THE COUNTER MEDICATION Glucose support One pill per day    . sitaGLIPtin (JANUVIA) 100 MG tablet Take 1 tablet (100 mg total) by mouth daily. 30 tablet 5   No current facility-administered medications for this visit.     Allergies: No Known Allergies  Past Surgical History:  Procedure Laterality Date  . COLONOSCOPY W/ POLYPECTOMY  06/22/2014   4 colon polyps; repeat 3 years; [place].    Social History   Social History  . Marital status: Single    Spouse name: N/A  . Number of children: N/A  . Years of education: N/A   Social History Main Topics  . Smoking status: Former Smoker    Years: 17.00    Types: Cigars    Quit date: 09/29/1975  . Smokeless tobacco: Never Used  . Alcohol  use No  . Drug use: No  . Sexual activity: Not Currently   Other Topics Concern  . None   Social History Narrative   Marital status: widowed x 2; married x 22 years first marriage; second marriage x 41.  Not dating.      Children:  3 children; 3 grandchildren; 3 great-grandchildren.      Lives: with son      Employment: retired in 2012; truck Geophysicist/field seismologist      Tobacco: quit in 1977; cigars and pipes      Alcohol: none      Exercise:  Walking 3 days per week in park.      ADLs: drives; walks unassisted; daughter-in-law does laundry; goes to grocery store.     ROS  Objective: Vitals:   12/21/16 0934 12/21/16 0949  BP: (!) 209/96 (!) 190/99  Pulse: 68 62  Resp: 16   Temp: 98.1 F (36.7 C)   TempSrc: Oral   SpO2: 95%   Weight: 168 lb 6.4 oz (76.4 kg)   Height: 5\' 2"  (1.575 m)    Diabetic Foot Exam - Simple   Simple Foot Form Visual Inspection No deformities, no ulcerations, no other skin breakdown bilaterally:  Yes Sensation Testing Intact to touch and monofilament testing bilaterally:  Yes Pulse Check Posterior Tibialis and Dorsalis pulse intact bilaterally:  Yes Comments Very dry feet with long curved toenails on big both big toe. Other toes has long toenails also      Physical Exam  Constitutional: He is oriented to person, place, and time. He appears well-developed and well-nourished.  HENT:  Head: Normocephalic and atraumatic.  Right Ear: External ear normal.  Left Ear: External ear normal.  Mouth/Throat: Oropharynx is clear and moist.  Eyes: Conjunctivae and EOM are normal.  Cardiovascular: Normal rate, regular rhythm, normal heart sounds and intact distal pulses.   No murmur heard. Pulmonary/Chest: Effort normal and breath sounds normal. No respiratory distress. He has no wheezes.  Abdominal: Soft. Bowel sounds are normal. He exhibits no distension and no mass. There is no tenderness. There is no rebound and no guarding.  Musculoskeletal: Normal range of motion. He exhibits no edema.  Neurological: He is alert and oriented to person, place, and time.  Skin: Skin is warm. Capillary refill takes less than 2 seconds. No erythema.  Psychiatric: He has a normal mood and affect. His behavior is normal. Judgment and thought content normal.    Foot Exam Diabetic Foot Exam - Simple   Simple Foot Form Visual Inspection No deformities, no ulcerations, no other skin breakdown bilaterally:  Yes Sensation Testing Intact to touch and monofilament testing bilaterally:  Yes Pulse Check Posterior Tibialis and Dorsalis pulse  intact bilaterally:  Yes Comments Very dry feet with long curved toenails on big both big toe. Other toes has long toenails also     Pt with severe thickening of the nail Great toe with nails more than 6 inches long  remvoed today with sterile nail trimmers   ECG sinsus with st depression and twave inversion when compared to to 2014 this is a worsening of t wave changes in the anterior lateral leads  Assessment and Plan Joram was seen today for hypertension.  Diagnoses and all orders for this visit:  Uncontrolled hypertension- patient not taking second dose of carvedilol Pt did not take todays doses of meds Will follow up on labs to assess renal function -     Comprehensive metabolic panel -  Lipid panel -     TSH -     EKG 12-Lead -     Microalbumin, urine -     Ambulatory referral to Cardiology  Non compliance w medication regimen- will simplify regimen with once daily dosing   Type 2 diabetes mellitus without complication, without long-term current use of insulin (HCC) -     TSH -     POCT glycosylated hemoglobin (Hb A1C) -     EKG 12-Lead -     HM Diabetes Foot Exam -     Ambulatory referral to Podiatry  Lower urinary tract symptoms (LUTS)- no UTI Likely BPH After considering the risks and benefits of screening pt declined prostate cancer screening -     POCT urinalysis dipstick  Abnormal ECG Advised to follow up with Cardiology Ischemic changes are suggestive of previous heart damage -     Ambulatory referral to Cardiology  Onychomycosis- trimmed nails today in clinic and there is still very thickened skin Referred to podiatry  -     Ambulatory referral to Podiatry  A total of 45 minutes were spent face-to-face with the patient during this encounter and over half of that time was spent on counseling and coordination of care.     [place]

## 2016-12-21 NOTE — Patient Instructions (Addendum)
   IF you received an x-ray today, you will receive an invoice from [facility]. Please contact [facility] at [phone] with questions or concerns regarding your invoice.   IF you received labwork today, you will receive an invoice from [facility]. Please contact [facility] at [phone] with questions or concerns regarding your invoice.   Our billing staff will not be able to assist you with questions regarding bills from these companies.  You will be contacted with the lab results as soon as they are available. The fastest way to get your results is to activate your My Chart account. Instructions are located on the last page of this paperwork. If you have not heard from us regarding the results in 2 weeks, please contact this office.     DASH Eating Plan DASH stands for "Dietary Approaches to Stop Hypertension." The DASH eating plan is a healthy eating plan that has been shown to reduce high blood pressure (hypertension). It may also reduce your risk for type 2 diabetes, heart disease, and stroke. The DASH eating plan may also help with weight loss. What are tips for following this plan? General guidelines   Avoid eating more than 2,300 mg (milligrams) of salt (sodium) a day. If you have hypertension, you may need to reduce your sodium intake to 1,500 mg a day.  Limit alcohol intake to no more than 1 drink a day for nonpregnant women and 2 drinks a day for men. One drink equals 12 oz of beer, 5 oz of wine, or 1 oz of hard liquor.  Work with your health care provider to maintain a healthy body weight or to lose weight. Ask what an ideal weight is for you.  Get at least 30 minutes of exercise that causes your heart to beat faster (aerobic exercise) most days of the week. Activities may include walking, swimming, or biking.  Work with your health care provider or diet and nutrition specialist (dietitian) to adjust your eating plan to your individual calorie  needs. Reading food labels   Check food labels for the amount of sodium per serving. Choose foods with less than 5 percent of the Daily Value of sodium. Generally, foods with less than 300 mg of sodium per serving fit into this eating plan.  To find whole grains, look for the word "whole" as the first word in the ingredient list. Shopping   Buy products labeled as "low-sodium" or "no salt added."  Buy fresh foods. Avoid canned foods and premade or frozen meals. Cooking   Avoid adding salt when cooking. Use salt-free seasonings or herbs instead of table salt or sea salt. Check with your health care provider or pharmacist before using salt substitutes.  Do not fry foods. Cook foods using healthy methods such as baking, boiling, grilling, and broiling instead.  Cook with heart-healthy oils, such as olive, canola, soybean, or sunflower oil. Meal planning    Eat a balanced diet that includes:  5 or more servings of fruits and vegetables each day. At each meal, try to fill half of your plate with fruits and vegetables.  Up to 6-8 servings of whole grains each day.  Less than 6 oz of lean meat, poultry, or fish each day. A 3-oz serving of meat is about the same size as a deck of cards. One egg equals 1 oz.  2 servings of low-fat dairy each day.  A serving of nuts, seeds, or beans 5 times each week.  Heart-healthy fats. Healthy fats called   Omega-3 fatty acids are found in foods such as flaxseeds and coldwater fish, like sardines, salmon, and mackerel.  Limit how much you eat of the following:  Canned or prepackaged foods.  Food that is high in trans fat, such as fried foods.  Food that is high in saturated fat, such as fatty meat.  Sweets, desserts, sugary drinks, and other foods with added sugar.  Full-fat dairy products.  Do not salt foods before eating.  Try to eat at least 2 vegetarian meals each week.  Eat more home-cooked food and less restaurant, buffet, and fast  food.  When eating at a restaurant, ask that your food be prepared with less salt or no salt, if possible. What foods are recommended? The items listed may not be a complete list. Talk with your dietitian about what dietary choices are best for you. Grains  Whole-grain or whole-wheat bread. Whole-grain or whole-wheat pasta. Brown rice. Oatmeal. Quinoa. Bulgur. Whole-grain and low-sodium cereals. Pita bread. Low-fat, low-sodium crackers. Whole-wheat flour tortillas. Vegetables  Fresh or frozen vegetables (raw, steamed, roasted, or grilled). Low-sodium or reduced-sodium tomato and vegetable juice. Low-sodium or reduced-sodium tomato sauce and tomato paste. Low-sodium or reduced-sodium canned vegetables. Fruits  All fresh, dried, or frozen fruit. Canned fruit in natural juice (without added sugar). Meat and other protein foods  Skinless chicken or turkey. Ground chicken or turkey. Pork with fat trimmed off. Fish and seafood. Egg whites. Dried beans, peas, or lentils. Unsalted nuts, nut butters, and seeds. Unsalted canned beans. Lean cuts of beef with fat trimmed off. Low-sodium, lean deli meat. Dairy  Low-fat (1%) or fat-free (skim) milk. Fat-free, low-fat, or reduced-fat cheeses. Nonfat, low-sodium ricotta or cottage cheese. Low-fat or nonfat yogurt. Low-fat, low-sodium cheese. Fats and oils  Soft margarine without trans fats. Vegetable oil. Low-fat, reduced-fat, or light mayonnaise and salad dressings (reduced-sodium). Canola, safflower, olive, soybean, and sunflower oils. Avocado. Seasoning and other foods  Herbs. Spices. Seasoning mixes without salt. Unsalted popcorn and pretzels. Fat-free sweets. What foods are not recommended? The items listed may not be a complete list. Talk with your dietitian about what dietary choices are best for you. Grains  Baked goods made with fat, such as croissants, muffins, or some breads. Dry pasta or rice meal packs. Vegetables  Creamed or fried vegetables.  Vegetables in a cheese sauce. Regular canned vegetables (not low-sodium or reduced-sodium). Regular canned tomato sauce and paste (not low-sodium or reduced-sodium). Regular tomato and vegetable juice (not low-sodium or reduced-sodium). Pickles. Olives. Fruits  Canned fruit in a light or heavy syrup. Fried fruit. Fruit in cream or butter sauce. Meat and other protein foods  Fatty cuts of meat. Ribs. Fried meat. Bacon. Sausage. Bologna and other processed lunch meats. Salami. Fatback. Hotdogs. Bratwurst. Salted nuts and seeds. Canned beans with added salt. Canned or smoked fish. Whole eggs or egg yolks. Chicken or turkey with skin. Dairy  Whole or 2% milk, cream, and half-and-half. Whole or full-fat cream cheese. Whole-fat or sweetened yogurt. Full-fat cheese. Nondairy creamers. Whipped toppings. Processed cheese and cheese spreads. Fats and oils  Butter. Stick margarine. Lard. Shortening. Ghee. Bacon fat. Tropical oils, such as coconut, palm kernel, or palm oil. Seasoning and other foods  Salted popcorn and pretzels. Onion salt, garlic salt, seasoned salt, table salt, and sea salt. Worcestershire sauce. Tartar sauce. Barbecue sauce. Teriyaki sauce. Soy sauce, including reduced-sodium. Steak sauce. Canned and packaged gravies. Fish sauce. Oyster sauce. Cocktail sauce. Horseradish that you find on the shelf. Ketchup. Mustard. Meat flavorings   and tenderizers. Bouillon cubes. Hot sauce and Tabasco sauce. Premade or packaged marinades. Premade or packaged taco seasonings. Relishes. Regular salad dressings. Where to find more information:  National Heart, Lung, and Blood Institute: www.nhlbi.nih.gov  American Heart Association: www.heart.org Summary  The DASH eating plan is a healthy eating plan that has been shown to reduce high blood pressure (hypertension). It may also reduce your risk for type 2 diabetes, heart disease, and stroke.  With the DASH eating plan, you should limit salt (sodium) intake  to 2,300 mg a day. If you have hypertension, you may need to reduce your sodium intake to 1,500 mg a day.  When on the DASH eating plan, aim to eat more fresh fruits and vegetables, whole grains, lean proteins, low-fat dairy, and heart-healthy fats.  Work with your health care provider or diet and nutrition specialist (dietitian) to adjust your eating plan to your individual calorie needs. This information is not intended to replace advice given to you by your health care provider. Make sure you discuss any questions you have with your health care provider. Document Released: 09/03/2011 Document Revised: 09/07/2016 Document Reviewed: 09/07/2016 Elsevier Interactive Patient Education  2017 Elsevier Inc.  

## 2016-12-22 ENCOUNTER — Telehealth: Payer: Self-pay | Admitting: Family Medicine

## 2016-12-22 ENCOUNTER — Other Ambulatory Visit: Payer: Self-pay | Admitting: Emergency Medicine

## 2016-12-22 LAB — COMPREHENSIVE METABOLIC PANEL
ALT: 16 IU/L (ref 0–44)
AST: 19 IU/L (ref 0–40)
Albumin/Globulin Ratio: 1.2 (ref 1.2–2.2)
Albumin: 4.2 g/dL (ref 3.5–4.7)
Alkaline Phosphatase: 56 IU/L (ref 39–117)
BUN/Creatinine Ratio: 12 (ref 10–24)
BUN: 17 mg/dL (ref 8–27)
Bilirubin Total: 0.5 mg/dL (ref 0.0–1.2)
CO2: 26 mmol/L (ref 18–29)
Calcium: 9.3 mg/dL (ref 8.6–10.2)
Chloride: 98 mmol/L (ref 96–106)
Creatinine, Ser: 1.37 mg/dL — ABNORMAL HIGH (ref 0.76–1.27)
GFR calc Af Amer: 56 mL/min/{1.73_m2} — ABNORMAL LOW (ref >59)
GFR calc non Af Amer: 48 mL/min/{1.73_m2} — ABNORMAL LOW (ref >59)
Globulin, Total: 3.5 g/dL (ref 1.5–4.5)
Glucose: 123 mg/dL — ABNORMAL HIGH (ref 65–99)
Potassium: 4.1 mmol/L (ref 3.5–5.2)
Sodium: 138 mmol/L (ref 134–144)
Total Protein: 7.7 g/dL (ref 6.0–8.5)

## 2016-12-22 LAB — LIPID PANEL
Chol/HDL Ratio: 3 ratio units (ref 0.0–5.0)
Cholesterol, Total: 120 mg/dL (ref 100–199)
HDL: 40 mg/dL (ref >39)
LDL Calculated: 62 mg/dL (ref 0–99)
Triglycerides: 90 mg/dL (ref 0–149)
VLDL Cholesterol Cal: 18 mg/dL (ref 5–40)

## 2016-12-22 LAB — MICROALBUMIN, URINE: Microalbumin, Urine: 240.5 ug/mL

## 2016-12-22 LAB — TSH: TSH: 3.9 u[IU]/mL (ref 0.450–4.500)

## 2016-12-22 MED ORDER — METFORMIN HCL 1000 MG PO TABS: ORAL_TABLET | ORAL | 1 refills | Status: DC

## 2016-12-22 MED ORDER — CARVEDILOL 12.5 MG PO TABS: 12.5000 mg | ORAL_TABLET | Freq: Two times a day (BID) | ORAL | 1 refills | Status: DC

## 2016-12-22 MED ORDER — LISINOPRIL-HYDROCHLOROTHIAZIDE 10-12.5 MG PO TABS: ORAL_TABLET | ORAL | 3 refills | Status: DC

## 2016-12-22 MED ORDER — SITAGLIPTIN PHOSPHATE 100 MG PO TABS: 100.0000 mg | ORAL_TABLET | Freq: Every day | ORAL | 5 refills | Status: DC

## 2016-12-22 MED ORDER — GLIPIZIDE ER 10 MG PO TB24: ORAL_TABLET | ORAL | 3 refills | Status: DC

## 2016-12-22 NOTE — Telephone Encounter (Signed)
Pt's son called stating the meds was sent to wrong pharmacy they wanted meds sent to [facility] on [address].. Pt's son stated they will pick up prescriptions this one time but make primary pharmacy [facility] [address]

## 2016-12-22 NOTE — Telephone Encounter (Signed)
Medication reordered and e-scribed to pharmacy 

## 2016-12-22 NOTE — Telephone Encounter (Signed)
Pt's son calling. Pt was seen yesterday by Dr Nolon Rod and multiple medications were to be called into pharmacy. None have been called in as of yet. He would like them sent to the [facility] at [facility] please.

## 2017-01-21 DIAGNOSIS — I10 Essential (primary) hypertension: Secondary | ICD-10-CM | POA: Diagnosis not present

## 2017-01-21 DIAGNOSIS — Z0189 Encounter for other specified special examinations: Secondary | ICD-10-CM | POA: Diagnosis not present

## 2017-01-21 DIAGNOSIS — R9431 Abnormal electrocardiogram [ECG] [EKG]: Secondary | ICD-10-CM | POA: Diagnosis not present

## 2017-01-21 DIAGNOSIS — E119 Type 2 diabetes mellitus without complications: Secondary | ICD-10-CM | POA: Diagnosis not present

## 2017-01-29 DIAGNOSIS — R9431 Abnormal electrocardiogram [ECG] [EKG]: Secondary | ICD-10-CM | POA: Diagnosis not present

## 2017-01-29 DIAGNOSIS — E119 Type 2 diabetes mellitus without complications: Secondary | ICD-10-CM | POA: Diagnosis not present

## 2017-01-29 DIAGNOSIS — I10 Essential (primary) hypertension: Secondary | ICD-10-CM | POA: Diagnosis not present

## 2017-02-02 ENCOUNTER — Encounter: Payer: Self-pay | Admitting: Family Medicine

## 2017-02-02 DIAGNOSIS — R9431 Abnormal electrocardiogram [ECG] [EKG]: Secondary | ICD-10-CM | POA: Insufficient documentation

## 2017-02-04 DIAGNOSIS — I10 Essential (primary) hypertension: Secondary | ICD-10-CM | POA: Diagnosis not present

## 2017-02-04 DIAGNOSIS — R9431 Abnormal electrocardiogram [ECG] [EKG]: Secondary | ICD-10-CM | POA: Diagnosis not present

## 2017-02-12 DIAGNOSIS — Z0189 Encounter for other specified special examinations: Secondary | ICD-10-CM | POA: Diagnosis not present

## 2017-02-12 DIAGNOSIS — I10 Essential (primary) hypertension: Secondary | ICD-10-CM | POA: Diagnosis not present

## 2017-02-12 DIAGNOSIS — R001 Bradycardia, unspecified: Secondary | ICD-10-CM | POA: Diagnosis not present

## 2017-02-12 DIAGNOSIS — R9431 Abnormal electrocardiogram [ECG] [EKG]: Secondary | ICD-10-CM | POA: Diagnosis not present

## 2017-02-19 ENCOUNTER — Other Ambulatory Visit: Payer: Self-pay | Admitting: Family Medicine

## 2017-03-08 ENCOUNTER — Ambulatory Visit (INDEPENDENT_AMBULATORY_CARE_PROVIDER_SITE_OTHER): Payer: PPO | Admitting: Podiatry

## 2017-03-08 ENCOUNTER — Encounter: Payer: Self-pay | Admitting: Podiatry

## 2017-03-08 DIAGNOSIS — E0842 Diabetes mellitus due to underlying condition with diabetic polyneuropathy: Secondary | ICD-10-CM

## 2017-03-08 DIAGNOSIS — M79676 Pain in unspecified toe(s): Secondary | ICD-10-CM | POA: Diagnosis not present

## 2017-03-08 DIAGNOSIS — B351 Tinea unguium: Secondary | ICD-10-CM | POA: Diagnosis not present

## 2017-03-17 NOTE — Progress Notes (Signed)
   SUBJECTIVE Patient with a history of diabetes mellitus presents to office today complaining of elongated, thickened nails. Pain while ambulating in shoes. Patient is unable to trim their own nails.   OBJECTIVE General Patient is awake, alert, and oriented x 3 and in no acute distress. Derm Skin is dry and supple bilateral. Negative open lesions or macerations. Remaining integument unremarkable. Nails are tender, long, thickened and dystrophic with subungual debris, consistent with onychomycosis, 1-5 bilateral. No signs of infection noted. Vasc  DP and PT pedal pulses palpable bilaterally. Temperature gradient within normal limits.  Neuro Epicritic and protective threshold sensation diminished bilaterally.  Musculoskeletal Exam No symptomatic pedal deformities noted bilateral. Muscular strength within normal limits.  ASSESSMENT 1. Diabetes Mellitus w/ peripheral neuropathy 2. Onychomycosis of nail due to dermatophyte bilateral 3. Pain in foot bilateral  PLAN OF CARE 1. Patient evaluated today. 2. Instructed to maintain good pedal hygiene and foot care. Stressed importance of controlling blood sugar.  3. Mechanical debridement of nails 1-5 bilaterally performed using a nail nipper. Filed with dremel without incident.  4. Return to clinic in 3 mos.     Avis Tirone M. Reiss Mowrey, DPM [facility]  Dr. Samari Bittinger M. [place], DPM    [address]                                        [place], [place] [address]                Office [phone]  Fax [phone]       

## 2017-03-23 ENCOUNTER — Other Ambulatory Visit: Payer: Self-pay | Admitting: Family Medicine

## 2017-03-24 ENCOUNTER — Encounter: Payer: Self-pay | Admitting: Family Medicine

## 2017-03-24 ENCOUNTER — Ambulatory Visit (INDEPENDENT_AMBULATORY_CARE_PROVIDER_SITE_OTHER): Payer: PPO | Admitting: Family Medicine

## 2017-03-24 VITALS — BP 109/69 | HR 50 | Temp 97.5°F | Resp 16 | Ht 61.22 in | Wt 158.4 lb

## 2017-03-24 DIAGNOSIS — E119 Type 2 diabetes mellitus without complications: Secondary | ICD-10-CM

## 2017-03-24 DIAGNOSIS — Z125 Encounter for screening for malignant neoplasm of prostate: Secondary | ICD-10-CM

## 2017-03-24 DIAGNOSIS — E162 Hypoglycemia, unspecified: Secondary | ICD-10-CM | POA: Diagnosis not present

## 2017-03-24 DIAGNOSIS — R972 Elevated prostate specific antigen [PSA]: Secondary | ICD-10-CM

## 2017-03-24 DIAGNOSIS — I9589 Other hypotension: Secondary | ICD-10-CM

## 2017-03-24 DIAGNOSIS — R634 Abnormal weight loss: Secondary | ICD-10-CM | POA: Diagnosis not present

## 2017-03-24 LAB — POCT GLYCOSYLATED HEMOGLOBIN (HGB A1C): Hemoglobin A1C: 6.2

## 2017-03-24 MED ORDER — METFORMIN HCL 500 MG PO TABS: 500.0000 mg | ORAL_TABLET | Freq: Two times a day (BID) | ORAL | 3 refills | Status: DC

## 2017-03-24 MED ORDER — LISINOPRIL 5 MG PO TABS: 5.0000 mg | ORAL_TABLET | Freq: Every day | ORAL | 1 refills | Status: DC

## 2017-03-24 NOTE — Progress Notes (Signed)
Chief Complaint  Patient presents with  . Diabetes    followup    HPI   Diabetes Mellitus: Patient presents for follow up of diabetes. He lives with his son and daughter in law They have been checking his diabetes  Per his daughter in law his sugars were running low so she stopped the glipizide and metformin.  He had readings 86, 90, 87, 100 The patient reports that he does not eat 3 meals a day every day Sometimes he gets started late with breakfast and will eat his breakfast at dinner He reports that he cannot tell when his sugars are running low.  He takes carvedilol. Lab Results  Component Value Date   HGBA1C 6.2 03/24/2017   Wt Readings from Last 3 Encounters:  03/24/17 158 lb 6.4 oz (71.8 kg)  12/21/16 168 lb 6.4 oz (76.4 kg)  02/12/15 190 lb (86.2 kg)  Body mass index is 29.71 kg/m.  He reports that he has lost some weight as well. He has lost 10 pounds over the past 3 months.  He is trying to keep the weight off and eats very balanced meals.    Hypertension: Patient here for follow-up of elevated blood pressure. He is eating a healthy diet about once or twice a day. He has lost weight and is feeling find. He denies dizziness, palpitations or shortness of breath.  He was previously non-compliant with his carvedilol and had very high blood pressure readings. Now he is compliant and his readings are too low.  He denies history of MI or CAD. BP Readings from Last 3 Encounters:  03/24/17 109/69  12/21/16 (!) 190/99  09/01/16 (!) 214/86   Screening for prostate cancer Pt has been eating less and has a reason for weight loss He had a colonoscopy in 2015 but does not recall being checked for prostate cancer He reports that he does not have a family history of prostate cancer   Past Medical History:  Diagnosis Date  . Colon polyps 06/22/2014   4 colon polyps by colonoscopy.  Repeat 3 years.  Hung.  . Diabetes mellitus   . Hypertension   . Other and unspecified  hyperlipidemia   . PE (pulmonary embolism)     Current Outpatient Prescriptions  Medication Sig Dispense Refill  . aspirin 81 MG tablet Take 81 mg by mouth daily.    Marland Kitchen OVER THE COUNTER MEDICATION Blood pressure factors  One pill per day    . lisinopril (PRINIVIL,ZESTRIL) 5 MG tablet Take 1 tablet (5 mg total) by mouth daily. 30 tablet 1  . metFORMIN (GLUCOPHAGE) 500 MG tablet Take 1 tablet (500 mg total) by mouth 2 (two) times daily with a meal. 180 tablet 3  . OVER THE COUNTER MEDICATION Glucose support One pill per day     No current facility-administered medications for this visit.     Allergies: No Known Allergies  Past Surgical History:  Procedure Laterality Date  . COLONOSCOPY W/ POLYPECTOMY  06/22/2014   4 colon polyps; repeat 3 years; [place].    Social History   Social History  . Marital status: Single    Spouse name: N/A  . Number of children: N/A  . Years of education: N/A   Social History Main Topics  . Smoking status: Former Smoker    Years: 17.00    Types: Cigars    Quit date: 09/29/1975  . Smokeless tobacco: Never Used  . Alcohol use No  . Drug use: No  . Sexual  activity: Not Currently   Other Topics Concern  . None   Social History Narrative   Marital status: widowed x 2; married x 22 years first marriage; second marriage x 32.  Not dating.      Children:  3 children; 3 grandchildren; 3 great-grandchildren.      Lives: with son      Employment: retired in 2012; truck Geophysicist/field seismologist      Tobacco: quit in 1977; cigars and pipes      Alcohol: none      Exercise:  Walking 3 days per week in park.      ADLs: drives; walks unassisted; daughter-in-law does laundry; goes to grocery store.    ROS  Objective: Vitals:   03/24/17 1017  BP: 109/69  Pulse: (!) 50  Resp: 16  Temp: 97.5 F (36.4 C)  TempSrc: Oral  SpO2: 97%  Weight: 158 lb 6.4 oz (71.8 kg)  Height: 5' 1.22" (1.555 m)    Physical Exam  Constitutional: He is oriented to person, place, and  time. He appears well-developed and well-nourished.  HENT:  Head: Normocephalic and atraumatic.  Eyes: Conjunctivae and EOM are normal.  Cardiovascular: Regular rhythm and normal heart sounds.   Sinus bradycardia  Pulmonary/Chest: Effort normal and breath sounds normal. No respiratory distress. He has no wheezes.  Neurological: He is alert and oriented to person, place, and time.  Psychiatric: He has a normal mood and affect. His behavior is normal. Judgment and thought content normal.    Assessment and Plan Randall was seen today for diabetes.  Diagnoses and all orders for this visit:  Type 2 diabetes mellitus without complication, without long-term current use of insulin (HCC) -     POCT glycosylated hemoglobin (Hb A1C)  Hypoglycemia -     POCT glycosylated hemoglobin (Hb A1C) -     Comprehensive metabolic panel  Screening PSA (prostate specific antigen) -     Comprehensive metabolic panel -     PSA  Loss of weight -     Comprehensive metabolic panel  Iatrogenic hypotension  Other orders -     lisinopril (PRINIVIL,ZESTRIL) 5 MG tablet; Take 1 tablet (5 mg total) by mouth daily. -     metFORMIN (GLUCOPHAGE) 500 MG tablet; Take 1 tablet (500 mg total) by mouth 2 (two) times daily with a meal.   Problem List Items Addressed This Visit      Endocrine   Hypoglycemia    Will check a1c Last a1c was 6.6 Stopped medications today. Explained that with the weight loss and eating smaller meals he is diet controlled. He should continue a low dose ace inhibitor      Relevant Orders   POCT glycosylated hemoglobin (Hb A1C) (Completed)   Comprehensive metabolic panel     Other   Loss of weight   Relevant Orders   Comprehensive metabolic panel   Iatrogenic hypotension    Other Visit Diagnoses    Type 2 diabetes mellitus without complication, without long-term current use of insulin (HCC)    -  Primary   Relevant Medications   lisinopril (PRINIVIL,ZESTRIL) 5 MG tablet    metFORMIN (GLUCOPHAGE) 500 MG tablet   Other Relevant Orders   POCT glycosylated hemoglobin (Hb A1C) (Completed)   Screening PSA (prostate specific antigen)       Relevant Orders   Comprehensive metabolic panel   PSA        Keyontay Stolz A Nolon Rod

## 2017-03-24 NOTE — Assessment & Plan Note (Signed)
Will check a1c Last a1c was 6.6 Stopped medications today. Explained that with the weight loss and eating smaller meals he is diet controlled. He should continue a low dose ace inhibitor

## 2017-03-24 NOTE — Patient Instructions (Addendum)
Blood pressure goals:   Top number 130-140 Bottom number 50-90 A blood pressure of 106/69 is too low based on your age. If you blood pressure gets high again we can increase your lisinopril.  Please call for any blood pressure readings of 160/90 or higher.   Because of the weight loss you will not require as much medication.   Also stop all your diabetes medications and monitor your blood glucose for a month.  The numbers fasting should be around 100-120 After a meal a reading of 160-180 is a good range.  IF you received an x-ray today, you will receive an invoice from [facility]. Please contact [facility] at [phone] with questions or concerns regarding your invoice.   IF you received labwork today, you will receive an invoice from [facility]. Please contact [facility] at [phone] with questions or concerns regarding your invoice.   Our billing staff will not be able to assist you with questions regarding bills from these companies.  You will be contacted with the lab results as soon as they are available. The fastest way to get your results is to activate your My Chart account. Instructions are located on the last page of this paperwork. If you have not heard from [place] regarding the results in 2 weeks, please contact this office.

## 2017-03-25 LAB — COMPREHENSIVE METABOLIC PANEL
ALT: 8 IU/L (ref 0–44)
AST: 16 IU/L (ref 0–40)
Albumin/Globulin Ratio: 1.2 (ref 1.2–2.2)
Albumin: 4.2 g/dL (ref 3.5–4.7)
Alkaline Phosphatase: 47 IU/L (ref 39–117)
BUN/Creatinine Ratio: 16 (ref 10–24)
BUN: 30 mg/dL — ABNORMAL HIGH (ref 8–27)
Bilirubin Total: 0.5 mg/dL (ref 0.0–1.2)
CO2: 25 mmol/L (ref 20–29)
Calcium: 9.6 mg/dL (ref 8.6–10.2)
Chloride: 102 mmol/L (ref 96–106)
Creatinine, Ser: 1.92 mg/dL — ABNORMAL HIGH (ref 0.76–1.27)
GFR calc Af Amer: 37 mL/min/{1.73_m2} — ABNORMAL LOW (ref >59)
GFR calc non Af Amer: 32 mL/min/{1.73_m2} — ABNORMAL LOW (ref >59)
Globulin, Total: 3.6 g/dL (ref 1.5–4.5)
Glucose: 114 mg/dL — ABNORMAL HIGH (ref 65–99)
Potassium: 4.3 mmol/L (ref 3.5–5.2)
Sodium: 140 mmol/L (ref 134–144)
Total Protein: 7.8 g/dL (ref 6.0–8.5)

## 2017-03-25 LAB — PSA: Prostate Specific Ag, Serum: 7.4 ng/mL — ABNORMAL HIGH (ref 0.0–4.0)

## 2017-03-26 NOTE — Addendum Note (Signed)
Addended by: Delia Chimes A on: 03/26/2017 12:43 PM   Modules accepted: Orders

## 2017-03-30 ENCOUNTER — Other Ambulatory Visit: Payer: Self-pay | Admitting: Family Medicine

## 2017-04-03 NOTE — Telephone Encounter (Signed)
The patient was discontinued off his medication due to low blood pressure and fall risk.  Can someone call his daughter or son to see what his home blood pressure reading is?  BP Readings from Last 3 Encounters:  03/24/17 109/69  12/21/16 (!) 190/99  09/01/16 (!) 214/86

## 2017-04-07 ENCOUNTER — Other Ambulatory Visit: Payer: Self-pay | Admitting: Family Medicine

## 2017-06-09 ENCOUNTER — Ambulatory Visit: Payer: PPO | Admitting: Podiatry

## 2017-10-01 ENCOUNTER — Telehealth: Payer: Self-pay

## 2017-10-01 ENCOUNTER — Other Ambulatory Visit: Payer: Self-pay

## 2017-10-01 ENCOUNTER — Encounter: Payer: Self-pay | Admitting: Family Medicine

## 2017-10-01 ENCOUNTER — Telehealth: Payer: Self-pay | Admitting: Family Medicine

## 2017-10-01 ENCOUNTER — Ambulatory Visit (INDEPENDENT_AMBULATORY_CARE_PROVIDER_SITE_OTHER): Payer: PPO | Admitting: Family Medicine

## 2017-10-01 VITALS — BP 165/85 | HR 71 | Temp 97.9°F | Resp 16 | Ht 61.22 in | Wt 157.6 lb

## 2017-10-01 DIAGNOSIS — Z23 Encounter for immunization: Secondary | ICD-10-CM | POA: Diagnosis not present

## 2017-10-01 DIAGNOSIS — I10 Essential (primary) hypertension: Secondary | ICD-10-CM | POA: Diagnosis not present

## 2017-10-01 DIAGNOSIS — R350 Frequency of micturition: Secondary | ICD-10-CM | POA: Diagnosis not present

## 2017-10-01 DIAGNOSIS — E119 Type 2 diabetes mellitus without complications: Secondary | ICD-10-CM | POA: Diagnosis not present

## 2017-10-01 LAB — COMPREHENSIVE METABOLIC PANEL
ALT: 16 IU/L (ref 0–44)
AST: 18 IU/L (ref 0–40)
Albumin/Globulin Ratio: 1 — ABNORMAL LOW (ref 1.2–2.2)
Albumin: 4.3 g/dL (ref 3.5–4.7)
Alkaline Phosphatase: 41 IU/L (ref 39–117)
BUN/Creatinine Ratio: 14 (ref 10–24)
BUN: 25 mg/dL (ref 8–27)
Bilirubin Total: 0.5 mg/dL (ref 0.0–1.2)
CO2: 21 mmol/L (ref 20–29)
Calcium: 9.8 mg/dL (ref 8.6–10.2)
Chloride: 105 mmol/L (ref 96–106)
Creatinine, Ser: 1.76 mg/dL — ABNORMAL HIGH (ref 0.76–1.27)
GFR calc Af Amer: 41 mL/min/{1.73_m2} — ABNORMAL LOW (ref >59)
GFR calc non Af Amer: 35 mL/min/{1.73_m2} — ABNORMAL LOW (ref >59)
Globulin, Total: 4.1 g/dL (ref 1.5–4.5)
Glucose: 98 mg/dL (ref 65–99)
Potassium: 4.1 mmol/L (ref 3.5–5.2)
Sodium: 144 mmol/L (ref 134–144)
Total Protein: 8.4 g/dL (ref 6.0–8.5)

## 2017-10-01 LAB — POCT URINALYSIS DIP (MANUAL ENTRY)
Bilirubin, UA: NEGATIVE
Blood, UA: NEGATIVE
Glucose, UA: NEGATIVE mg/dL
Ketones, POC UA: NEGATIVE mg/dL
Leukocytes, UA: NEGATIVE
Nitrite, UA: NEGATIVE
Protein Ur, POC: NEGATIVE mg/dL
Spec Grav, UA: 1.01 (ref 1.010–1.025)
Urobilinogen, UA: 0.2 E.U./dL
pH, UA: 5 (ref 5.0–8.0)

## 2017-10-01 LAB — LIPID PANEL
Chol/HDL Ratio: 2.5 ratio (ref 0.0–5.0)
Cholesterol, Total: 104 mg/dL (ref 100–199)
HDL: 41 mg/dL (ref >39)
LDL Calculated: 41 mg/dL (ref 0–99)
Triglycerides: 109 mg/dL (ref 0–149)
VLDL Cholesterol Cal: 22 mg/dL (ref 5–40)

## 2017-10-01 LAB — POCT GLYCOSYLATED HEMOGLOBIN (HGB A1C): Hemoglobin A1C: 5.8

## 2017-10-01 MED ORDER — LISINOPRIL 5 MG PO TABS: 5.0000 mg | ORAL_TABLET | Freq: Every day | ORAL | 1 refills | Status: DC

## 2017-10-01 MED ORDER — METFORMIN HCL 500 MG PO TABS: 500.0000 mg | ORAL_TABLET | Freq: Every day | ORAL | 1 refills | Status: DC

## 2017-10-01 MED ORDER — METFORMIN HCL 500 MG PO TABS: 500.0000 mg | ORAL_TABLET | Freq: Two times a day (BID) | ORAL | 1 refills | Status: DC

## 2017-10-01 NOTE — Addendum Note (Signed)
Addended by: Leim Fabry on: 10/01/2017 10:11 AM   Modules accepted: Orders

## 2017-10-01 NOTE — Progress Notes (Signed)
Chief Complaint  Patient presents with  . Medication Refill    lisinopril, metformin,     HPI   Hypertension: Patient here for follow-up of hypertension.  He was last seen 03/23/17.  He was taken off the carvedilol at the last visit.  With his weight loss he was able to achieve good BP results. BP Readings from Last 3 Encounters:  10/01/17 (!) 165/85  03/24/17 109/69  12/21/16 (!) 190/99    He is not exercising and is adherent to low salt diet.  Blood pressure is well controlled at home but he is out of his medication. Cardiac symptoms none. Patient denies chest pain, chest pressure/discomfort, dyspnea, exertional chest pressure/discomfort, fatigue, irregular heart beat and lower extremity edema.  Cardiovascular risk factors: advanced age (older than 54 for men, 50 for women), diabetes mellitus, hypertension, male gender and sedentary lifestyle. Use of agents associated with hypertension: none. He has been out of his lisinopril for a month.   Diabetes Mellitus: Patient presents for follow up of diabetes. Symptoms: none. Symptoms have been well-controlled. Patient denies foot ulcerations, hyperglycemia, hypoglycemia , increase appetite, nausea and paresthesia of the feet.  Evaluation to date has been included: hemoglobin A1C.  Home sugars: patient does not check sugars. Treatment to date: Continued metformin which has been effective.  Lab Results  Component Value Date   HGBA1C 5.8 10/01/2017   Previous A1c June 2018 6.2%  Past Medical History:  Diagnosis Date  . Colon polyps 06/22/2014   4 colon polyps by colonoscopy.  Repeat 3 years.  Hung.  . Diabetes mellitus   . Hypertension   . Other and unspecified hyperlipidemia   . PE (pulmonary embolism)     Current Outpatient Medications  Medication Sig Dispense Refill  . aspirin 81 MG tablet Take 81 mg by mouth daily.    Marland Kitchen lisinopril (PRINIVIL,ZESTRIL) 5 MG tablet Take 1 tablet (5 mg total) by mouth daily. 30 tablet 1  . metFORMIN  (GLUCOPHAGE) 500 MG tablet Take 1 tablet (500 mg total) by mouth 2 (two) times daily with a meal. 180 tablet 3  . OVER THE COUNTER MEDICATION Blood pressure factors  One pill per day    . OVER THE COUNTER MEDICATION Glucose support One pill per day     No current facility-administered medications for this visit.     Allergies: No Known Allergies  Past Surgical History:  Procedure Laterality Date  . COLONOSCOPY W/ POLYPECTOMY  06/22/2014   4 colon polyps; repeat 3 years; [place].    Social History   Socioeconomic History  . Marital status: Single    Spouse name: Not on file  . Number of children: Not on file  . Years of education: Not on file  . Highest education level: Not on file  Social Needs  . Financial resource strain: Not on file  . Food insecurity - worry: Not on file  . Food insecurity - inability: Not on file  . Transportation needs - medical: Not on file  . Transportation needs - non-medical: Not on file  Occupational History  . Not on file  Tobacco Use  . Smoking status: Former Smoker    Years: 17.00    Types: Cigars    Last attempt to quit: 09/29/1975    Years since quitting: 42.0  . Smokeless tobacco: Never Used  Substance and Sexual Activity  . Alcohol use: No  . Drug use: No  . Sexual activity: Not Currently  Other Topics Concern  . Not on  file  Social History Narrative   Marital status: widowed x 2; married x 22 years first marriage; second marriage x 8.  Not dating.      Children:  3 children; 3 grandchildren; 3 great-grandchildren.      Lives: with son      Employment: retired in 2012; truck Geophysicist/field seismologist      Tobacco: quit in 1977; cigars and pipes      Alcohol: none      Exercise:  Walking 3 days per week in park.      ADLs: drives; walks unassisted; daughter-in-law does laundry; goes to grocery store.    Family History  Problem Relation Age of Onset  . Diabetes Other      ROS Review of Systems See HPI Constitution: No fevers or chills No  malaise No diaphoresis Skin: No rash or itching Eyes: no blurry vision, no double vision GU: no dysuria or hematuria, +urinary frequency described as every 3 hours He denies urinary retention or hesistancy Neuro: no dizziness or headaches all others reviewed and negative   Objective: Vitals:   10/01/17 0829  BP: (!) 165/85  Pulse: 71  Resp: 16  Temp: 97.9 F (36.6 C)  TempSrc: Oral  SpO2: 94%  Weight: 157 lb 9.6 oz (71.5 kg)  Height: 5' 1.22" (1.555 m)   Diabetic Foot Exam - Simple   Simple Foot Form Visual Inspection No deformities, no ulcerations, no other skin breakdown bilaterally:  Yes Sensation Testing Intact to touch and monofilament testing bilaterally:  Yes Pulse Check Posterior Tibialis and Dorsalis pulse intact bilaterally:  Yes Comments     Physical Exam  Constitutional: He is oriented to person, place, and time. He appears well-developed and well-nourished.  HENT:  Head: Normocephalic and atraumatic.  Eyes: Conjunctivae and EOM are normal.  Neck: Normal range of motion. No thyromegaly present.  Cardiovascular: Normal rate, regular rhythm, normal heart sounds and intact distal pulses.  No murmur heard. Pulmonary/Chest: Effort normal and breath sounds normal. No stridor. No respiratory distress. He has no wheezes.  Abdominal: Soft. Bowel sounds are normal. He exhibits no distension. There is no tenderness. There is no guarding.  Neurological: He is alert and oriented to person, place, and time.  Skin: Skin is warm. Capillary refill takes less than 2 seconds.  Psychiatric: He has a normal mood and affect. His behavior is normal. Judgment and thought content normal.        Chemistry      Component Value Date/Time   NA 140 03/24/2017 1053   K 4.3 03/24/2017 1053   CL 102 03/24/2017 1053   CO2 25 03/24/2017 1053   BUN 30 (H) 03/24/2017 1053   CREATININE 1.92 (H) 03/24/2017 1053   CREATININE 1.43 (H) 07/04/2014 0948   GLU 105 02/12/2011        Component Value Date/Time   CALCIUM 9.6 03/24/2017 1053   ALKPHOS 47 03/24/2017 1053   AST 16 03/24/2017 1053   ALT 8 03/24/2017 1053   BILITOT 0.5 03/24/2017 1053     Lab Results  Component Value Date   CHOL 120 12/21/2016   CHOL 135 07/04/2014   CHOL 131 05/17/2013   Lab Results  Component Value Date   HDL 40 12/21/2016   HDL 42 07/04/2014   HDL 37 (L) 05/17/2013   Lab Results  Component Value Date   LDLCALC 62 12/21/2016   LDLCALC 62 07/04/2014   LDLCALC 68 05/17/2013   Lab Results  Component Value Date  TRIG 90 12/21/2016   TRIG 153 (H) 07/04/2014   TRIG 129 05/17/2013   Lab Results  Component Value Date   CHOLHDL 3.0 12/21/2016   CHOLHDL 3.2 07/04/2014   CHOLHDL 3.5 05/17/2013   No results found for: LDLDIRECT  Component     Latest Ref Rng & Units 10/01/2017 10/01/2017         9:01 AM  9:01 AM  Color, UA     yellow yellow   Clarity, UA     clear cloudy (A)   Glucose     negative mg/dL negative   Bilirubin, UA     negative negative negative  Specific Gravity, UA     1.010 - 1.025 1.010   RBC, UA     negative negative   pH, UA     5.0 - 8.0 5.0   Protein, UA     negative mg/dL negative   Urobilinogen, UA     0.2 or 1.0 E.U./dL 0.2   Nitrite, UA     Negative Negative   Leukocytes, UA     Negative Negative     Assessment and Plan Problem List Items Addressed This Visit      Cardiovascular and Mediastinum   Uncontrolled hypertension    Patient currently out of his lisinopril. He was previously hypotensive due to polypharmacy. Will add on just lisinopril and have him return for nurse visit in one month for bp check. Checking creatinine today.       Relevant Medications   lisinopril (PRINIVIL,ZESTRIL) 5 MG tablet   Other Relevant Orders   Comprehensive metabolic panel   Lipid panel     Endocrine   Type 2 diabetes mellitus without complication, without long-term current use of insulin (HCC) - Primary    a1c is well controlled. Will  advise him to take his metformin once a day instead of twice a day. He should continue ADA diet.        Relevant Medications   lisinopril (PRINIVIL,ZESTRIL) 5 MG tablet   metFORMIN (GLUCOPHAGE) 500 MG tablet   Other Relevant Orders   Comprehensive metabolic panel   Lipid panel   POCT glycosylated hemoglobin (Hb A1C) (Completed)     Other   Urinary frequency    Urine dip negative for infection. No other LUTS symptoms. His frequency is every 3 hours which is acceptable. Reassurance provided.       Relevant Orders   POCT urinalysis dipstick (Completed)    Other Visit Diagnoses    Flu vaccine need           Sergio [name]

## 2017-10-01 NOTE — Telephone Encounter (Signed)
Spoke with Facilities manager at [facility] an advised of metformin dosage change for pt. New dosage is 500 mg once a day.  Melanie verbalized understanding.  Tried calling pt to advise to cut his metformin bact to once a day.  Per person who answered phone pt not home from dr's office yet-advised to have pt call me Britt Boozer) when he returns- will give message and have pt call me.

## 2017-10-01 NOTE — Assessment & Plan Note (Signed)
Patient currently out of his lisinopril. He was previously hypotensive due to polypharmacy. Will add on just lisinopril and have him return for nurse visit in one month for bp check. Checking creatinine today.

## 2017-10-01 NOTE — Assessment & Plan Note (Signed)
Urine dip negative for infection. No other LUTS symptoms. His frequency is every 3 hours which is acceptable. Reassurance provided.

## 2017-10-01 NOTE — Patient Instructions (Addendum)
Next clinic visit in one month for a nurse visit for blood pressure check  Next office visit in 6 months    IF you received an x-ray today, you will receive an invoice from [facility]. Please contact [facility] at [phone] with questions or concerns regarding your invoice.   IF you received labwork today, you will receive an invoice from [place]. Please contact [facility] at [phone] with questions or concerns regarding your invoice.   Our billing staff will not be able to assist you with questions regarding bills from these companies.  You will be contacted with the lab results as soon as they are available. The fastest way to get your results is to activate your My Chart account. Instructions are located on the last page of this paperwork. If you have not heard from [place] regarding the results in 2 weeks, please contact this office.     Urinary Frequency, Adult Urinary frequency means urinating more often than usual. People with urinary frequency urinate at least 8 times in 24 hours, even if they drink a normal amount of fluid. Although they urinate more often than normal, the total amount of urine produced in a day may be normal. Urinary frequency is also called pollakiuria. What are the causes? This condition may be caused by:  A urinary tract infection.  Obesity.  Bladder problems, such as bladder stones.  Caffeine or alcohol.  Eating food or drinking fluids that irritate the bladder. These include coffee, tea, soda, artificial sweeteners, citrus, tomato-based foods, and chocolate.  Certain medicines, such as medicines that help the body get rid of extra fluid (diuretics).  Muscle or nerve weakness.  Overactive bladder.  Chronic diabetes.  Interstitial cystitis.  In men, problems with the prostate, such as an enlarged prostate.  In women, pregnancy.  In some cases, the cause may not be known. What increases the risk? This condition is more  likely to develop in:  Women who have gone through menopause.  Men with prostate problems.  People with a disease or injury that affects the nerves or spinal cord.  People who have or have had a condition that affects the brain, such as a stroke.  What are the signs or symptoms? Symptoms of this condition include:  Feeling an urgent need to urinate often. The stress and anxiety of needing to find a bathroom quickly can make this urge worse.  Urinating 8 or more times in 24 hours.  Urinating as often as every 1 to 2 hours.  How is this diagnosed? This condition is diagnosed based on your symptoms, your medical history, and a physical exam. You may have tests, such as:  Blood tests.  Urine tests.  Imaging tests, such as X-rays or ultrasounds.  A bladder test.  A test of your neurological system. This is the body system that senses the need to urinate.  A test to check for problems in the urethra and bladder called cystoscopy.  You may also be asked to keep a bladder diary. A bladder diary is a record of what you eat and drink, how often you urinate, and how much you urinate. You may need to see a health care provider who specializes in conditions of the urinary tract (urologist) or kidneys (nephrologist). How is this treated? Treatment for this condition depends on the cause. Sometimes the condition goes away on its own and treatment is not necessary. If treatment is needed, it may include:  Taking medicine.  Learning exercises that strengthen the  muscles that help control urination.  Following a bladder training program. This may include: ? Learning to delay going to the bathroom. ? Double urinating (voiding). This helps if you are not completely emptying your bladder. ? Scheduled voiding.  Making diet changes, such as: ? Avoiding caffeine. ? Drinking fewer fluids, especially alcohol. ? Not drinking in the evening. ? Not having foods or drinks that may irritate the  bladder. ? Eating foods that help prevent or ease constipation. Constipation can make this condition worse.  Having the nerves in your bladder stimulated. There are two options for stimulating the nerves to your bladder: ? Outpatient electrical nerve stimulation. This is done by your health care provider. ? Surgery to implant a bladder pacemaker. The pacemaker helps to control the urge to urinate.  Follow these instructions at home:  Keep a bladder diary if told to by your health care provider.  Take over-the-counter and prescription medicines only as told by your health care provider.  Do any exercises as told by your health care provider.  Follow a bladder training program as told by your health care provider.  Make any recommended diet changes.  Keep all follow-up visits as told by your health care provider. This is important. Contact a health care provider if:  You start urinating more often.  You feel pain or irritation when you urinate.  You notice blood in your urine.  Your urine looks cloudy.  You develop a fever.  You begin vomiting. Get help right away if:  You are unable to urinate. This information is not intended to replace advice given to you by your health care provider. Make sure you discuss any questions you have with your health care provider. Document Released: 07/11/2009 Document Revised: 10/16/2015 Document Reviewed: 04/10/2015 Elsevier Interactive Patient Education  [employer].

## 2017-10-01 NOTE — Telephone Encounter (Signed)
Pt's wife called to verify what medications pt should be on. Pt know that he takes lisinopril and metformin. Wife is asking about Januvia and Pravastatin. Per chart januvia therapy ended 03/24/17. Pt was also asking if he needs to take a cholesterol med. Pt requesting call back.

## 2017-10-01 NOTE — Assessment & Plan Note (Signed)
a1c is well controlled. Will advise him to take his metformin once a day instead of twice a day. He should continue ADA diet.

## 2017-10-04 NOTE — Telephone Encounter (Signed)
Spoke with pt and daughter in law Elvia Collum an advised pt is only taking aspirin 81 mg daily, metformin 500 mg daily and lisinopril 5 mg daily.  Per daughter in law pt bp's running [phone] systolic and 95 diastolic-running high due to his food choices.  Advised would let dr Nolon Rod know and if she makes adjustment I will let them know.  Pt and daughter in law verbalize understanding. DGaddy, CMA

## 2017-10-07 ENCOUNTER — Telehealth: Payer: Self-pay | Admitting: Family Medicine

## 2017-10-07 NOTE — Telephone Encounter (Unsigned)
Copied from [place] [phone]. >> Oct 07, 2017  2:23 PM Neva Seat wrote: Blood Pressure readings :  186/98 6 pm 10-05-17 195/113 7:45 am 10-06-17 197/117- 8:25 am 10-07-17  Pt' caregiver is calling back to see if she can get pt to come in today at 3:40 appt w/ Dr. Nolon Rod.

## 2017-10-08 ENCOUNTER — Encounter: Payer: Self-pay | Admitting: Physician Assistant

## 2017-10-08 ENCOUNTER — Ambulatory Visit (INDEPENDENT_AMBULATORY_CARE_PROVIDER_SITE_OTHER): Payer: PPO | Admitting: Physician Assistant

## 2017-10-08 VITALS — BP 194/100 | HR 64 | Temp 98.6°F | Resp 17 | Ht 62.5 in | Wt 159.0 lb

## 2017-10-08 DIAGNOSIS — I10 Essential (primary) hypertension: Secondary | ICD-10-CM | POA: Diagnosis not present

## 2017-10-08 NOTE — Progress Notes (Signed)
Sergio [name]  MRN: [id number] DOB: 1933/05/24  PCP: Forrest Moron, MD  Subjective:  Pt is a pleasant 84 year old male PMH HTN and Dm who presents to clinic for f/u blood pressure. He was here earlier today for nurse only visit to check blood pressures, however he has been nervous about elevated home blood pressure readings so he came back to be seen. He has been in the past for HTN by colleague Dr. Nolon Rod.  Last OV for this problem 10/01/2017.  He is currently taking Lisinopril 5mg s. Recent dose reduction from Lisinopril 10mg  last week. Taken off Carvedilol 03/24/2017.   He has been taking blood pressures at home with the following readings:  1/8 at 6 pm 186/98 1/9 7:45 am 195/113 1/10 8:25am  197/117 1/11 7:00 am 187/98  He took medication this morning. He denies cardiac symptoms.  Blood pressures in the office today: 142/84, 150/80 and 194/100.  Cornflakes in the morning, beef stew at night. Stays away from pork.    Review of Systems  Constitutional: Negative for chills and diaphoresis.  Respiratory: Negative for cough, chest tightness, shortness of breath and wheezing.   Cardiovascular: Negative for chest pain, palpitations and leg swelling.  Gastrointestinal: Negative for diarrhea, nausea and vomiting.  Musculoskeletal: Negative for neck pain.  Neurological: Negative for dizziness, syncope, light-headedness and headaches.  Psychiatric/Behavioral: Negative for sleep disturbance. The patient is not nervous/anxious.     Patient Active Problem List   Diagnosis Date Noted  . Urinary frequency 10/01/2017  . Type 2 diabetes mellitus without complication, without long-term current use of insulin ([place]) 10/01/2017  . Loss of weight 03/24/2017  . Hypoglycemia 03/24/2017  . Iatrogenic hypotension 03/24/2017  . Nonspecific abnormal electrocardiogram (ECG) (EKG) 02/02/2017  . Colon polyps 07/04/2014  . Controlled diabetes mellitus without long-term current use of insulin ([place])  12/18/2011  . Uncontrolled hypertension   . Other and unspecified hyperlipidemia     Current Outpatient Medications on File Prior to Visit  Medication Sig Dispense Refill  . aspirin 81 MG tablet Take 81 mg by mouth daily.    Marland Kitchen lisinopril (PRINIVIL,ZESTRIL) 5 MG tablet Take 1 tablet (5 mg total) by mouth daily. 90 tablet 1  . metFORMIN (GLUCOPHAGE) 500 MG tablet Take 1 tablet (500 mg total) by mouth daily with breakfast. 90 tablet 1  . OVER THE COUNTER MEDICATION Blood pressure factors  One pill per day    . OVER THE COUNTER MEDICATION Glucose support One pill per day     No current facility-administered medications on file prior to visit.     No Known Allergies   Objective:  BP (!) 150/80 (BP Location: Right Arm, Cuff Size: Normal)   Pulse 64   Temp 98.6 F (37 C) (Oral)   Resp 17   Ht 5' 2.5" (1.588 m)   Wt 159 lb (72.1 kg)   SpO2 98%   BMI 28.62 kg/m   Physical Exam  Constitutional: He is oriented to person, place, and time and well-developed, well-nourished, and in no distress. No distress.  Neck: Carotid bruit is not present.  Cardiovascular: Normal rate, regular rhythm and normal heart sounds.  Neurological: He is alert and oriented to person, place, and time. GCS score is 15.  Skin: Skin is warm and dry.  Psychiatric: Mood, memory, affect and judgment normal.  Vitals reviewed.   Assessment and Plan :  1. Uncontrolled hypertension - Pt presents for blood pressure check. He is a pt of Dr. Nolon Rod.  He is currently taking Lisinopril 5mg  - Recent dose reduction from Lisinopril 10mg  last week. Taken off Carvedilol 03/24/2017. Blood pressure in the office today is 142/84. Home blood pressures have also been running high. He is asymptomatic. Plan to increase dose to Lisinopril 10mg  qd. Con't BP log at home. Advised pt to make sure home BP machine is calibrated correctly. RC in 1 month to recheck with Dr. Nolon Rod.    Mercer Pod, PA-C  [facility] [place] 10/08/2017 11:00 AM

## 2017-10-08 NOTE — Patient Instructions (Addendum)
Make an appointment with Dr. Nolon [name] in one month for blood pressure check.  Be sure your blood pressure machine is calibrated correctly Start taking 10mg  Lisinopril daily (so 2 pills of the 5mg  pills). Follow-up with Dr. Nolon [name] in one month.  Continue to take your blood pressure readings at home - also write down when you are taking your medicine.    DASH Eating Plan DASH stands for "Dietary Approaches to Stop Hypertension." The DASH eating plan is a healthy eating plan that has been shown to reduce high blood pressure (hypertension). It may also reduce your risk for type 2 diabetes, heart disease, and stroke. The DASH eating plan may also help with weight loss. What are tips for following this plan? General guidelines  Avoid eating more than 2,300 mg (milligrams) of salt (sodium) a day. If you have hypertension, you may need to reduce your sodium intake to 1,500 mg a day.  Limit alcohol intake to no more than 1 drink a day for nonpregnant women and 2 drinks a day for men. One drink equals 12 oz of beer, 5 oz of wine, or 1 oz of hard liquor.  Work with your health care provider to maintain a healthy body weight or to lose weight. Ask what an ideal weight is for you.  Get at least 30 minutes of exercise that causes your heart to beat faster (aerobic exercise) most days of the week. Activities may include walking, swimming, or biking.  Work with your health care provider or diet and nutrition specialist (dietitian) to adjust your eating plan to your individual calorie needs. Reading food labels  Check food labels for the amount of sodium per serving. Choose foods with less than 5 percent of the Daily Value of sodium. Generally, foods with less than 300 mg of sodium per serving fit into this eating plan.  To find whole grains, look for the word "whole" as the first word in the ingredient list. Shopping  Buy products labeled as "low-sodium" or "no salt added."  Buy fresh foods. Avoid  canned foods and premade or frozen meals. Cooking  Avoid adding salt when cooking. Use salt-free seasonings or herbs instead of table salt or sea salt. Check with your health care provider or pharmacist before using salt substitutes.  Do not fry foods. Cook foods using healthy methods such as baking, boiling, grilling, and broiling instead.  Cook with heart-healthy oils, such as olive, canola, soybean, or sunflower oil. Meal planning   Eat a balanced diet that includes: ? 5 or more servings of fruits and vegetables each day. At each meal, try to fill half of your plate with fruits and vegetables. ? Up to 6-8 servings of whole grains each day. ? Less than 6 oz of lean meat, poultry, or fish each day. A 3-oz serving of meat is about the same size as a deck of cards. One egg equals 1 oz. ? 2 servings of low-fat dairy each day. ? A serving of nuts, seeds, or beans 5 times each week. ? Heart-healthy fats. Healthy fats called Omega-3 fatty acids are found in foods such as flaxseeds and coldwater fish, like sardines, salmon, and mackerel.  Limit how much you eat of the following: ? Canned or prepackaged foods. ? Food that is high in trans fat, such as fried foods. ? Food that is high in saturated fat, such as fatty meat. ? Sweets, desserts, sugary drinks, and other foods with added sugar. ? Full-fat dairy products.  Do not salt  foods before eating.  Try to eat at least 2 vegetarian meals each week.  Eat more home-cooked food and less restaurant, buffet, and fast food.  When eating at a restaurant, ask that your food be prepared with less salt or no salt, if possible. What foods are recommended? The items listed may not be a complete list. Talk with your dietitian about what dietary choices are best for you. Grains Whole-grain or whole-wheat bread. Whole-grain or whole-wheat pasta. Brown rice. Sergio [name]. Bulgur. Whole-grain and low-sodium cereals. Pita bread. Low-fat, low-sodium  crackers. Whole-wheat flour tortillas. Vegetables Fresh or frozen vegetables (raw, steamed, roasted, or grilled). Low-sodium or reduced-sodium tomato and vegetable juice. Low-sodium or reduced-sodium tomato sauce and tomato paste. Low-sodium or reduced-sodium canned vegetables. Fruits All fresh, dried, or frozen fruit. Canned fruit in natural juice (without added sugar). Meat and other protein foods Skinless chicken or [place]. Ground chicken or [place]. Pork with fat trimmed off. Fish and seafood. Egg whites. Dried beans, peas, or lentils. Unsalted nuts, nut butters, and seeds. Unsalted canned beans. Lean cuts of beef with fat trimmed off. Low-sodium, lean deli meat. Dairy Low-fat (1%) or fat-free (skim) milk. Fat-free, low-fat, or reduced-fat cheeses. Nonfat, low-sodium ricotta or cottage cheese. Low-fat or nonfat yogurt. Low-fat, low-sodium cheese. Fats and oils Soft margarine without trans fats. Vegetable oil. Low-fat, reduced-fat, or light mayonnaise and salad dressings (reduced-sodium). Canola, safflower, olive, soybean, and sunflower oils. Avocado. Seasoning and other foods Herbs. Spices. Seasoning mixes without salt. Unsalted popcorn and pretzels. Fat-free sweets. What foods are not recommended? The items listed may not be a complete list. Talk with your dietitian about what dietary choices are best for you. Grains Baked goods made with fat, such as croissants, muffins, or some breads. Dry pasta or rice meal packs. Vegetables Creamed or fried vegetables. Vegetables in a cheese sauce. Regular canned vegetables (not low-sodium or reduced-sodium). Regular canned tomato sauce and paste (not low-sodium or reduced-sodium). Regular tomato and vegetable juice (not low-sodium or reduced-sodium). Sergio [name]. Olives. Fruits Canned fruit in a light or heavy syrup. Fried fruit. Fruit in cream or butter sauce. Meat and other protein foods Fatty cuts of meat. Ribs. Fried meat. Sergio [name]. Sausage. Bologna and  other processed lunch meats. Salami. Fatback. Hotdogs. Bratwurst. Salted nuts and seeds. Canned beans with added salt. Canned or smoked fish. Whole eggs or egg yolks. Chicken or [place] with skin. Dairy Whole or 2% milk, cream, and half-and-half. Whole or full-fat cream cheese. Whole-fat or sweetened yogurt. Full-fat cheese. Nondairy creamers. Whipped toppings. Processed cheese and cheese spreads. Fats and oils Butter. Stick margarine. Lard. Shortening. Ghee. Bacon fat. Tropical oils, such as coconut, palm kernel, or palm oil. Seasoning and other foods Salted popcorn and pretzels. Onion salt, garlic salt, seasoned salt, table salt, and sea salt. Worcestershire sauce. Tartar sauce. Barbecue sauce. Teriyaki sauce. Soy sauce, including reduced-sodium. Steak sauce. Canned and packaged gravies. Fish sauce. Oyster sauce. Cocktail sauce. Horseradish that you find on the shelf. Ketchup. Mustard. Meat flavorings and tenderizers. Bouillon cubes. Hot sauce and Tabasco sauce. Premade or packaged marinades. Premade or packaged taco seasonings. Relishes. Regular salad dressings. Where to find more information:  National Heart, Lung, and [place]: https://wilson-eaton.com/  American Heart Association: www.heart.org Summary  The DASH eating plan is a healthy eating plan that has been shown to reduce high blood pressure (hypertension). It may also reduce your risk for type 2 diabetes, heart disease, and stroke.  With the DASH eating plan, you should limit salt (sodium) intake to 2,300  mg a day. If you have hypertension, you may need to reduce your sodium intake to 1,500 mg a day.  When on the DASH eating plan, aim to eat more fresh fruits and vegetables, whole grains, lean proteins, low-fat dairy, and heart-healthy fats.  Work with your health care provider or diet and nutrition specialist (dietitian) to adjust your eating plan to your individual calorie needs. This information is not intended to replace advice  given to you by your health care provider. Make sure you discuss any questions you have with your health care provider. Document Released: 09/03/2011 Document Revised: 09/07/2016 Document Reviewed: 09/07/2016 Elsevier Interactive Patient Education  2018 [employer].  IF you received an x-ray today, you will receive an invoice from [facility]. Please contact [facility] at [phone] with questions or concerns regarding your invoice.   IF you received labwork today, you will receive an invoice from [place]. Please contact [facility] at [phone] with questions or concerns regarding your invoice.   Our billing staff will not be able to assist you with questions regarding bills from these companies.  You will be contacted with the lab results as soon as they are available. The fastest way to get your results is to activate your My Chart account. Instructions are located on the last page of this paperwork. If you have not heard from [place] regarding the results in 2 weeks, please contact this office.

## 2017-10-08 NOTE — Progress Notes (Signed)
Nurse visit for blood pressure check

## 2017-11-03 ENCOUNTER — Encounter: Payer: Self-pay | Admitting: Family Medicine

## 2017-11-03 ENCOUNTER — Ambulatory Visit (INDEPENDENT_AMBULATORY_CARE_PROVIDER_SITE_OTHER): Payer: PPO | Admitting: Family Medicine

## 2017-11-03 ENCOUNTER — Other Ambulatory Visit: Payer: Self-pay

## 2017-11-03 VITALS — BP 218/99 | HR 56 | Temp 97.8°F | Resp 16 | Ht 62.5 in | Wt 154.0 lb

## 2017-11-03 DIAGNOSIS — I10 Essential (primary) hypertension: Secondary | ICD-10-CM

## 2017-11-03 MED ORDER — LISINOPRIL 20 MG PO TABS: 20.0000 mg | ORAL_TABLET | Freq: Every day | ORAL | 1 refills | Status: DC

## 2017-11-03 NOTE — Progress Notes (Signed)
Chief Complaint  Patient presents with  . Hypertension    follow up    HPI  Pt has a history of hypertension He was seen in the office March 24, 2017 and noted to have hypotension due to polypharmacy He was decreased on his lisinopril and taken off coreg  October 01, 2017 he was seen for bp med refill and had elevated bp of 165/85 off all bp meds He was refilled lisinopril 5mg   He was instructed to continue home bp monitoring and return to clinic for bp check  10/08/2017 the patient was seen by Loree Fee McVey who increased his lisinopril 5mg  to 10mg  due to bp mof 194/100  He is here today for nurse visit His home bp range has been incre4aseing from 160s-210 His home cuff this morning showed 197/96 P 54 This morning his bp at home was 210/109 P58 BP Readings from Last 3 Encounters:  11/03/17 (!) 218/99  10/08/17 (!) 194/100  10/01/17 (!) 165/85   Today his bp is 204/82 with a manual cuff  He is currently taking lisipril 10mg     Past Medical History:  Diagnosis Date  . Colon polyps 06/22/2014   4 colon polyps by colonoscopy.  Repeat 3 years.  Hung.  . Diabetes mellitus   . Hypertension   . Other and unspecified hyperlipidemia   . PE (pulmonary embolism)     Current Outpatient Medications  Medication Sig Dispense Refill  . aspirin 81 MG tablet Take 81 mg by mouth daily.    Marland Kitchen lisinopril (PRINIVIL,ZESTRIL) 20 MG tablet Take 1 tablet (20 mg total) by mouth daily. 30 tablet 1  . metFORMIN (GLUCOPHAGE) 500 MG tablet Take 1 tablet (500 mg total) by mouth daily with breakfast. 90 tablet 1  . OVER THE COUNTER MEDICATION Blood pressure factors  One pill per day    . OVER THE COUNTER MEDICATION Glucose support One pill per day     No current facility-administered medications for this visit.     Allergies: No Known Allergies  Past Surgical History:  Procedure Laterality Date  . COLONOSCOPY W/ POLYPECTOMY  06/22/2014   4 colon polyps; repeat 3 years; [place].    Social  History   Socioeconomic History  . Marital status: Single    Spouse name: None  . Number of children: None  . Years of education: None  . Highest education level: None  Social Needs  . Financial resource strain: None  . Food insecurity - worry: None  . Food insecurity - inability: None  . Transportation needs - medical: None  . Transportation needs - non-medical: None  Occupational History  . None  Tobacco Use  . Smoking status: Current Some Day Smoker    Years: 17.00    Types: Cigars    Last attempt to quit: 09/29/1975    Years since quitting: 42.1  . Smokeless tobacco: Never Used  Substance and Sexual Activity  . Alcohol use: No  . Drug use: No  . Sexual activity: Not Currently  Other Topics Concern  . None  Social History Narrative   Marital status: widowed x 2; married x 22 years first marriage; second marriage x 25.  Not dating.      Children:  3 children; 3 grandchildren; 3 great-grandchildren.      Lives: with son      Employment: retired in 2012; truck Geophysicist/field seismologist      Tobacco: quit in 1977; cigars and pipes      Alcohol: none  Exercise:  Walking 3 days per week in park.      ADLs: drives; walks unassisted; daughter-in-law does laundry; goes to grocery store.    Family History  Problem Relation Age of Onset  . Diabetes Other      ROS Review of Systems See HPI Constitution: No fevers or chills No malaise No diaphoresis Skin: No rash or itching Eyes: no blurry vision, no double vision GU: no dysuria or hematuria Neuro: no dizziness or headaches all others reviewed and negative   Objective: Vitals:   11/03/17 0916  BP: (!) 218/99  Pulse: (!) 56  Resp: 16  Temp: 97.8 F (36.6 C)  TempSrc: Oral  SpO2: 94%  Weight: 154 lb (69.9 kg)  Height: 5' 2.5" (1.588 m)  HC: 1" (2.5 cm)    Physical Exam  Constitutional: He is oriented to person, place, and time. He appears well-developed and well-nourished.  HENT:  Head: Normocephalic and atraumatic.    Eyes: Conjunctivae and EOM are normal.  Cardiovascular: Normal rate, regular rhythm and normal heart sounds.  Pulmonary/Chest: Effort normal and breath sounds normal. No stridor. No respiratory distress.  Neurological: He is alert and oriented to person, place, and time.  Psychiatric: He has a normal mood and affect. His behavior is normal. Judgment and thought content normal.    Assessment and Plan Sergio [name] was seen today for hypertension.  Diagnoses and all orders for this visit:  Uncontrolled hypertension Increased lisinopril to 10 mg Close follow up for bp Due to pulse <60 did not resume coreg Since asymptomatic will discharge to home Other orders -     lisinopril (PRINIVIL,ZESTRIL) 20 MG tablet; Take 1 tablet (20 mg total) by mouth daily.     [place]

## 2017-11-03 NOTE — Patient Instructions (Addendum)
I have increased your lisinopril to 20mg  Since you are having continued high blood pressure readings.   Continue to track your blood pressure at home. Return in 2 weeks for a blood pressure follow up    How to Take Your Blood Pressure You can take your blood pressure at home with a machine. You may need to check your blood pressure at home:  To check if you have high blood pressure (hypertension).  To check your blood pressure over time.  To make sure your blood pressure medicine is working.  Supplies needed: You will need a blood pressure machine, or monitor. You can buy one at a drugstore or online. When choosing one:  Choose one with an arm cuff.  Choose one that wraps around your upper arm. Only one finger should fit between your arm and the cuff.  Do not choose one that measures your blood pressure from your wrist or finger.  Your doctor can suggest a monitor. How to prepare Avoid these things for 30 minutes before checking your blood pressure:  Drinking caffeine.  Drinking alcohol.  Eating.  Smoking.  Exercising.  Five minutes before checking your blood pressure:  Pee.  Sit in a dining chair. Avoid sitting in a soft couch or armchair.  Be quiet. Do not talk.  How to take your blood pressure Follow the instructions that came with your machine. If you have a digital blood pressure monitor, these may be the instructions: 1. Sit up straight. 2. Place your feet on the floor. Do not cross your ankles or legs. 3. Rest your left arm at the level of your heart. You may rest it on a table, desk, or chair. 4. Pull up your shirt sleeve. 5. Wrap the blood pressure cuff around the upper part of your left arm. The cuff should be 1 inch (2.5 cm) above your elbow. It is best to wrap the cuff around bare skin. 6. Fit the cuff snugly around your arm. You should be able to place only one finger between the cuff and your arm. 7. Put the cord inside the groove of your  elbow. 8. Press the power button. 9. Sit quietly while the cuff fills with air and loses air. 10. Write down the numbers on the screen. 11. Wait 2-3 minutes and then repeat steps 1-10.  What do the numbers mean? Two numbers make up your blood pressure. The first number is called systolic pressure. The second is called diastolic pressure. An example of a blood pressure reading is "120 over 80" (or 120/80). If you are an adult and do not have a medical condition, use this guide to find out if your blood pressure is normal: Normal  First number: below 120.  Second number: below 80. Elevated  First number: 120-129.  Second number: below 80. Hypertension stage 1  First number: 130-139.  Second number: 80-89. Hypertension stage 2  First number: 140 or above.  Second number: 61 or above. Your blood pressure is above normal even if only the top or bottom number is above normal. Follow these instructions at home:  Check your blood pressure as often as your doctor tells you to.  Take your monitor to your next doctor's appointment. Your doctor will: ? Make sure you are using it correctly. ? Make sure it is working right.  Make sure you understand what your blood pressure numbers should be.  Tell your doctor if your medicines are causing side effects. Contact a doctor if:  Your  blood pressure keeps being high. Get help right away if:  Your first blood pressure number is higher than 180.  Your second blood pressure number is higher than 120. This information is not intended to replace advice given to you by your health care provider. Make sure you discuss any questions you have with your health care provider. Document Released: 08/27/2008 Document Revised: 08/12/2016 Document Reviewed: 02/21/2016 Elsevier Interactive Patient Education  2018 [employer].     IF you received an x-ray today, you will receive an invoice from [facility]. Please contact [facility] at [phone] with questions or concerns regarding your invoice.   IF you received labwork today, you will receive an invoice from [place]. Please contact [facility] at [phone] with questions or concerns regarding your invoice.   Our billing staff will not be able to assist you with questions regarding bills from these companies.  You will be contacted with the lab results as soon as they are available. The fastest way to get your results is to activate your My Chart account. Instructions are located on the last page of this paperwork. If you have not heard from [place] regarding the results in 2 weeks, please contact this office.

## 2017-11-04 ENCOUNTER — Telehealth: Payer: Self-pay | Admitting: Family Medicine

## 2017-11-04 NOTE — Telephone Encounter (Signed)
Copied from [place]. Topic: Quick Communication - Rx Refill/Question >> Nov 04, 2017 10:08 AM Cleaster Corin, NT wrote: Medication: lisinopril   Has the patient contacted their pharmacy? yes   (Agent: If no, request that the patient contact the pharmacy for the refill.)   Preferred Pharmacy (with phone number or street name): [place], [place] - [address] [place] [address] Phone: [phone] Fax: [phone]     Agent: Please be advised that RX refills may take up to 3 business days. We ask that you follow-up with your pharmacy.

## 2017-11-04 NOTE — Telephone Encounter (Signed)
Patient called to inform him that the lisinopril was sent to [facility] on [facility]. He says he uses the [place] at [facility]. I advised I would call [facility] to get the medication retrieved from the other pharmacy, her verbalized understanding.  I called [facility] and spoke to [place], K., [facility] and asked if he could retrieve the lisinopril 20 mg 30 tabs/1 refill, he verbalized he would send the request to have it sent over.

## 2017-11-18 ENCOUNTER — Encounter: Payer: Self-pay | Admitting: Family Medicine

## 2017-11-18 ENCOUNTER — Other Ambulatory Visit: Payer: Self-pay

## 2017-11-18 ENCOUNTER — Ambulatory Visit (INDEPENDENT_AMBULATORY_CARE_PROVIDER_SITE_OTHER): Payer: PPO | Admitting: Family Medicine

## 2017-11-18 VITALS — BP 160/98 | HR 73 | Temp 98.2°F | Resp 18 | Ht 62.5 in | Wt 150.6 lb

## 2017-11-18 DIAGNOSIS — I10 Essential (primary) hypertension: Secondary | ICD-10-CM

## 2017-11-18 NOTE — Progress Notes (Signed)
Chief Complaint  Patient presents with  . Hypertension    check     HPI  His blood pressure has improved with lisinopril 20mg   He had some muscle cramps but those resolved He denies cp, sob, palpitations, dizziness He does not exercise His Daughter in law checks his bp every day for him and is getting 160-190/90s with pulses that are low 60s BP Readings from Last 3 Encounters:  11/18/17 (!) 160/98  11/03/17 (!) 218/99  10/08/17 (!) 194/100      Past Medical History:  Diagnosis Date  . Colon polyps 06/22/2014   4 colon polyps by colonoscopy.  Repeat 3 years.  Hung.  . Diabetes mellitus   . Hypertension   . Other and unspecified hyperlipidemia   . PE (pulmonary embolism)     Current Outpatient Medications  Medication Sig Dispense Refill  . aspirin 81 MG tablet Take 81 mg by mouth daily.    Marland Kitchen lisinopril (PRINIVIL,ZESTRIL) 20 MG tablet Take 1 tablet (20 mg total) by mouth daily. 30 tablet 1  . metFORMIN (GLUCOPHAGE) 500 MG tablet Take 1 tablet (500 mg total) by mouth daily with breakfast. 90 tablet 1  . OVER THE COUNTER MEDICATION Blood pressure factors  One pill per day    . OVER THE COUNTER MEDICATION Glucose support One pill per day     No current facility-administered medications for this visit.     Allergies: No Known Allergies  Past Surgical History:  Procedure Laterality Date  . COLONOSCOPY W/ POLYPECTOMY  06/22/2014   4 colon polyps; repeat 3 years; [place].    Social History   Socioeconomic History  . Marital status: Single    Spouse name: None  . Number of children: None  . Years of education: None  . Highest education level: None  Social Needs  . Financial resource strain: None  . Food insecurity - worry: None  . Food insecurity - inability: None  . Transportation needs - medical: None  . Transportation needs - non-medical: None  Occupational History  . None  Tobacco Use  . Smoking status: Former Smoker    Years: 17.00    Types: Cigars   Last attempt to quit: 09/29/1975    Years since quitting: 42.1  . Smokeless tobacco: Never Used  Substance and Sexual Activity  . Alcohol use: No  . Drug use: No  . Sexual activity: Not Currently  Other Topics Concern  . None  Social History Narrative   Marital status: widowed x 2; married x 22 years first marriage; second marriage x 57.  Not dating.      Children:  3 children; 3 grandchildren; 3 great-grandchildren.      Lives: with son      Employment: retired in 2012; truck Geophysicist/field seismologist      Tobacco: quit in 1977; cigars and pipes      Alcohol: none      Exercise:  Walking 3 days per week in park.      ADLs: drives; walks unassisted; daughter-in-law does laundry; goes to grocery store.    Family History  Problem Relation Age of Onset  . Diabetes Other      ROS Review of Systems See HPI Constitution: No fevers or chills No malaise No diaphoresis Skin: No rash or itching Eyes: no blurry vision, no double vision GU: no dysuria or hematuria Neuro: no dizziness or headaches all others reviewed and negative   Objective: Vitals:   11/18/17 1420  BP: (!) 160/98  Pulse: 73  Resp: 18  Temp: 98.2 F (36.8 C)  TempSrc: Oral  SpO2: 96%  Weight: 150 lb 9.6 oz (68.3 kg)  Height: 5' 2.5" (1.588 m)    Physical Exam  Constitutional: He is oriented to person, place, and time. He appears well-developed and well-nourished.  HENT:  Head: Normocephalic and atraumatic.  Eyes: Conjunctivae and EOM are normal. Pupils are equal, round, and reactive to light.  Cardiovascular: Normal rate, regular rhythm and normal heart sounds.  Pulmonary/Chest: Effort normal and breath sounds normal. No stridor. No respiratory distress. He has no wheezes.  Neurological: He is alert and oriented to person, place, and time.    Assessment and Plan Sergio [name] was seen today for hypertension.  Diagnoses and all orders for this visit:  Uncontrolled hypertension -   Continue current meds Bring home bp monitor  to next visit Discussed decreasing the frequency of bp checks from daily to three times a week    [place]

## 2017-11-18 NOTE — Patient Instructions (Addendum)
   Check the blood pressure only three times a week The goal is to keep his blood pressure 150/90 I would like to observe this for another 2 months to see if it will come down some more I do not want his pulse to drop any lower or have his blood pressure be too low His next appointment will be in 2 months For the next visit please send the blood pressure cuff so we can compare it to ours here in the office.   IF you received an x-ray today, you will receive an invoice from [facility]. Please contact [facility] at [phone] with questions or concerns regarding your invoice.   IF you received labwork today, you will receive an invoice from [facility]. Please contact [facility] at [phone] with questions or concerns regarding your invoice.   Our billing staff will not be able to assist you with questions regarding bills from these companies.  You will be contacted with the lab results as soon as they are available. The fastest way to get your results is to activate your My Chart account. Instructions are located on the last page of this paperwork. If you have not heard from [place] regarding the results in 2 weeks, please contact this office.

## 2018-01-13 ENCOUNTER — Other Ambulatory Visit: Payer: Self-pay | Admitting: Family Medicine

## 2018-01-19 ENCOUNTER — Ambulatory Visit (INDEPENDENT_AMBULATORY_CARE_PROVIDER_SITE_OTHER): Payer: PPO | Admitting: Family Medicine

## 2018-01-19 ENCOUNTER — Other Ambulatory Visit: Payer: Self-pay

## 2018-01-19 ENCOUNTER — Encounter: Payer: Self-pay | Admitting: Family Medicine

## 2018-01-19 VITALS — BP 183/96 | HR 75 | Temp 98.0°F | Resp 17 | Ht 62.5 in | Wt 149.6 lb

## 2018-01-19 DIAGNOSIS — E119 Type 2 diabetes mellitus without complications: Secondary | ICD-10-CM

## 2018-01-19 DIAGNOSIS — I10 Essential (primary) hypertension: Secondary | ICD-10-CM

## 2018-01-19 MED ORDER — METFORMIN HCL 500 MG PO TABS: 500.0000 mg | ORAL_TABLET | Freq: Every day | ORAL | 1 refills | Status: DC

## 2018-01-19 MED ORDER — LISINOPRIL 20 MG PO TABS
20.0000 mg | ORAL_TABLET | Freq: Every day | ORAL | 1 refills | Status: DC
Start: 2018-01-19 — End: 2018-03-30

## 2018-01-19 NOTE — Patient Instructions (Signed)
     IF you received an x-ray today, you will receive an invoice from [facility]. Please contact [facility] at [phone] with questions or concerns regarding your invoice.   IF you received labwork today, you will receive an invoice from [facility]. Please contact [facility] at [phone] with questions or concerns regarding your invoice.   Our billing staff will not be able to assist you with questions regarding bills from these companies.  You will be contacted with the lab results as soon as they are available. The fastest way to get your results is to activate your My Chart account. Instructions are located on the last page of this paperwork. If you have not heard from us regarding the results in 2 weeks, please contact this office.     

## 2018-01-19 NOTE — Progress Notes (Signed)
Chief Complaint  Patient presents with  . Hypertension    f/u    HPI   Patient reports that he celebrated his birthday on [birth year] Family came over to visit He admits he ate some salty foods He had [place] and string beans and mashed potatoes His bp log shows that his blood pressures on 01/06/18 147/98 He has not picked up his bp medications from the pharmacy and did not take today's dose   BP Readings from Last 3 Encounters:  01/19/18 (!) 183/96  11/18/17 (!) 160/98  11/03/17 (!) 218/99    He denies chest pains, palpitations, shortness of breath He denies swelling in the ankle   Diabetes Lab Results  Component Value Date   HGBA1C 5.8 10/01/2017   Pt report that he takes his metformin He needs a refill His sugars are good Denies polyuria, polydipsia, polyphagia    Past Medical History:  Diagnosis Date  . Colon polyps 06/22/2014   4 colon polyps by colonoscopy.  Repeat 3 years.  Hung.  . Diabetes mellitus   . Hypertension   . Other and unspecified hyperlipidemia   . PE (pulmonary embolism)     Current Outpatient Medications  Medication Sig Dispense Refill  . aspirin 81 MG tablet Take 81 mg by mouth daily.    Marland Kitchen lisinopril (PRINIVIL,ZESTRIL) 20 MG tablet Take 1 tablet (20 mg total) by mouth daily. 90 tablet 1  . metFORMIN (GLUCOPHAGE) 500 MG tablet Take 1 tablet (500 mg total) by mouth daily with breakfast. 90 tablet 1  . OVER THE COUNTER MEDICATION Blood pressure factors  One pill per day    . OVER THE COUNTER MEDICATION Glucose support One pill per day     No current facility-administered medications for this visit.     Allergies: No Known Allergies  Past Surgical History:  Procedure Laterality Date  . COLONOSCOPY W/ POLYPECTOMY  06/22/2014   4 colon polyps; repeat 3 years; [place].    Social History   Socioeconomic History  . Marital status: Single    Spouse name: Not on file  . Number of children: Not on file  . Years of education: Not on file  .  Highest education level: Not on file  Occupational History  . Not on file  Social Needs  . Financial resource strain: Not on file  . Food insecurity:    Worry: Not on file    Inability: Not on file  . Transportation needs:    Medical: Not on file    Non-medical: Not on file  Tobacco Use  . Smoking status: Former Smoker    Years: 17.00    Types: Cigars    Last attempt to quit: 09/29/1975    Years since quitting: 42.3  . Smokeless tobacco: Never Used  Substance and Sexual Activity  . Alcohol use: No  . Drug use: No  . Sexual activity: Not Currently  Lifestyle  . Physical activity:    Days per week: Not on file    Minutes per session: Not on file  . Stress: Not on file  Relationships  . Social connections:    Talks on phone: Not on file    Gets together: Not on file    Attends religious service: Not on file    Active member of club or organization: Not on file    Attends meetings of clubs or organizations: Not on file    Relationship status: Not on file  Other Topics Concern  . Not on file  Social History Narrative   Marital status: widowed x 2; married x 22 years first marriage; second marriage x 8.  Not dating.      Children:  3 children; 3 grandchildren; 3 great-grandchildren.      Lives: with son      Employment: retired in 2012; truck Geophysicist/field seismologist      Tobacco: quit in 1977; cigars and pipes      Alcohol: none      Exercise:  Walking 3 days per week in park.      ADLs: drives; walks unassisted; daughter-in-law does laundry; goes to grocery store.    Family History  Problem Relation Age of Onset  . Diabetes Other      ROS Review of Systems See HPI Constitution: No fevers or chills No malaise No diaphoresis Skin: No rash or itching Eyes: no blurry vision, no double vision GU: no dysuria or hematuria Neuro: no dizziness or headaches * all others reviewed and negative   Objective: Vitals:   01/19/18 1008  BP: (!) 183/96  Pulse: 75  Resp: 17  Temp: 98 F  (36.7 C)  TempSrc: Oral  SpO2: 96%  Weight: 149 lb 9.6 oz (67.9 kg)  Height: 5' 2.5" (1.588 m)   BP Readings from Last 3 Encounters:  01/19/18 (!) 183/96  11/18/17 (!) 160/98  11/03/17 (!) 218/99     Physical Exam  Constitutional: He is oriented to person, place, and time. He appears well-developed and well-nourished.  HENT:  Head: Normocephalic and atraumatic.  Eyes: Conjunctivae and EOM are normal.  Neck: Normal range of motion. No thyromegaly present.  Cardiovascular: Normal rate, regular rhythm, normal heart sounds and intact distal pulses.  No murmur heard. Pulmonary/Chest: Effort normal and breath sounds normal. No stridor. No respiratory distress. He has no wheezes. He has no rales.  Neurological: He is alert and oriented to person, place, and time.  Psychiatric: He has a normal mood and affect. His behavior is normal. Judgment and thought content normal.    Diabetic Foot Exam - Simple   Simple Foot Form Visual Inspection No deformities, no ulcerations, no other skin breakdown bilaterally:  Yes Sensation Testing Intact to touch and monofilament testing bilaterally:  Yes Pulse Check Posterior Tibialis and Dorsalis pulse intact bilaterally:  Yes Comments     Assessment and Plan Caleel was seen today for hypertension.  Diagnoses and all orders for this visit:  Type 2 diabetes mellitus without complication, without long-term current use of insulin ([place])-  Refilled metformin for patient  Diabetes currently stable  -     metFORMIN (GLUCOPHAGE) 500 MG tablet; Take 1 tablet (500 mg total) by mouth daily with breakfast.  -     HM Diabetes Foot Exam  Essential hypertension -    Home bp monitor was elevated 68mm hg for the systolic compared to manual bp monitoring in the clinic Thus his home bp are at goal of 130-140 ZOX/09-60 diastolic Continue current dose and DASH diet Refilled medication today Return to clinic in 3 months Stop checking home bp and stop in here  for fast track visit prn bp -     lisinopril (PRINIVIL,ZESTRIL) 20 MG tablet; Take 1 tablet (20 mg total) by mouth daily.     [place]

## 2018-03-23 NOTE — Progress Notes (Signed)
Chief Complaint  Patient presents with  . Hypertension    6 month follow up.  Pt hasn't taken medication this morning  . Medication Refill    lisinopril and metformin 90 day    HPI   Uncontrolled Hypertension  Last visit for hypertension and diabetes 12/2017 Pt home bp monitor showed that his home bp monitor was 23mmhg elevated above our monitor in the clinic. His previous iatrogenic hypotension has resolved and is now elevated The lisinopril was dropped down to 5mg  and now is increased to lisinopril 20mg  Patient reports that he did not take his blood pressure medication this morning and was rushing to get to this appointment.  Called patient's daughter in law from the clinic and his home bp is 200s/105s She is concerned that his bp readings are elevated at home.  She is using a new bp cuff and had it verified at [facility].  BP Readings from Last 3 Encounters:  03/30/18 (!) 203/78  01/19/18 (!) 183/96  11/18/17 (!) 160/98      Diabetes Mellitus: Patient presents for follow up of diabetes. Symptoms: none.  Patient denies foot ulcerations, hyperglycemia, hypoglycemia , increase appetite, nausea, paresthesia of the feet, polydipsia, polyuria and visual disturbances.  Evaluation to date has been included: hemoglobin A1C.  Home sugars: BGs consistently in an acceptable range. Treatment to date: Continued metformin which has been effective.  Diabetes is well controlled. He does not exercise but has a very healthy diet.  Lab Results  Component Value Date   HGBA1C 5.8 10/01/2017     Past Medical History:  Diagnosis Date  . Colon polyps 06/22/2014   4 colon polyps by colonoscopy.  Repeat 3 years.  Hung.  . Diabetes mellitus   . Hypertension   . Other and unspecified hyperlipidemia   . PE (pulmonary embolism)     Current Outpatient Medications  Medication Sig Dispense Refill  . aspirin 81 MG tablet Take 81 mg by mouth daily.    Marland Kitchen lisinopril (PRINIVIL,ZESTRIL) 20 MG tablet  Take 1 tablet (20 mg total) by mouth daily. 90 tablet 1  . metFORMIN (GLUCOPHAGE) 500 MG tablet Take 1 tablet (500 mg total) by mouth daily with breakfast. 90 tablet 1  . OVER THE COUNTER MEDICATION Blood pressure factors  One pill per day    . OVER THE COUNTER MEDICATION Glucose support One pill per day     No current facility-administered medications for this visit.     Allergies: No Known Allergies  Past Surgical History:  Procedure Laterality Date  . COLONOSCOPY W/ POLYPECTOMY  06/22/2014   4 colon polyps; repeat 3 years; [place].    Social History   Socioeconomic History  . Marital status: Single    Spouse name: Not on file  . Number of children: Not on file  . Years of education: Not on file  . Highest education level: Not on file  Occupational History  . Not on file  Social Needs  . Financial resource strain: Not on file  . Food insecurity:    Worry: Not on file    Inability: Not on file  . Transportation needs:    Medical: Not on file    Non-medical: Not on file  Tobacco Use  . Smoking status: Former Smoker    Years: 17.00    Types: Cigars    Last attempt to quit: 09/29/1975    Years since quitting: 42.5  . Smokeless tobacco: Never Used  Substance and Sexual Activity  .  Alcohol use: No  . Drug use: No  . Sexual activity: Not Currently  Lifestyle  . Physical activity:    Days per week: Not on file    Minutes per session: Not on file  . Stress: Not on file  Relationships  . Social connections:    Talks on phone: Not on file    Gets together: Not on file    Attends religious service: Not on file    Active member of club or organization: Not on file    Attends meetings of clubs or organizations: Not on file    Relationship status: Not on file  Other Topics Concern  . Not on file  Social History Narrative   Marital status: widowed x 2; married x 22 years first marriage; second marriage x 30.  Not dating.      Children:  3 children; 3 grandchildren; 3  great-grandchildren.      Lives: with son      Employment: retired in 2012; truck Geophysicist/field seismologist      Tobacco: quit in 1977; cigars and pipes      Alcohol: none      Exercise:  Walking 3 days per week in park.      ADLs: drives; walks unassisted; daughter-in-law does laundry; goes to grocery store.    Family History  Problem Relation Age of Onset  . Diabetes Other      ROS Review of Systems See HPI Constitution: No fevers or chills No malaise No diaphoresis Skin: No rash or itching Eyes: no blurry vision, no double vision GU: no dysuria or hematuria Neuro: no dizziness or headaches all others reviewed and negative   Objective: Vitals:   03/30/18 0847  BP: (!) 203/78  Pulse: (!) 56  Resp: 16  Temp: (!) 97.5 F (36.4 C)  TempSrc: Oral  SpO2: 99%  Weight: 152 lb 9.6 oz (69.2 kg)  Height: 5' 2.5" (1.588 m)    Physical Exam Physical Exam  Constitutional: She is oriented to person, place, and time. She appears well-developed and well-nourished.  HENT:  Head: Normocephalic and atraumatic.  Eyes: Conjunctivae and EOM are normal.  Cardiovascular: Normal rate, regular rhythm and normal heart sounds.   Pulmonary/Chest: Effort normal and breath sounds normal. No respiratory distress. She has no wheezes.  Abdominal: Normal appearance and bowel sounds are normal. There is no tenderness. There is no CVA tenderness.  Neurological: She is alert and oriented to person, place, and time.    Assessment and Plan Sayeed was seen today for hypertension and medication refill.  Diagnoses and all orders for this visit:  Type 2 diabetes mellitus without complication, without long-term current use of insulin ([facility]) -     POCT glycosylated hemoglobin (Hb A1C) well controlled hemoglobin a1c is at goal Continue exercise Lipids monitored and renal function in range On metformin On ace or arb   Uncontrolled hypertension -     POCT glycosylated hemoglobin (Hb A1C) -     Basic metabolic  panel -     Ambulatory referral to [facility] Referred to home health Spoke to daughter in law about his home bp readings Discussed increasing bp meds  Encounter for medication monitoring -     POCT glycosylated hemoglobin (Hb A1C) -     Basic metabolic panel  A total of 30 minutes were spent face-to-face with the patient during this encounter and over half of that time was spent on counseling and coordination of care.  Spent time with patient  and also talking to patient's caregiver who is his daughter in law to coordinate his care.     [place]

## 2018-03-30 ENCOUNTER — Ambulatory Visit (INDEPENDENT_AMBULATORY_CARE_PROVIDER_SITE_OTHER): Payer: PPO | Admitting: Family Medicine

## 2018-03-30 ENCOUNTER — Encounter: Payer: Self-pay | Admitting: Family Medicine

## 2018-03-30 ENCOUNTER — Other Ambulatory Visit: Payer: Self-pay

## 2018-03-30 VITALS — BP 203/78 | HR 56 | Temp 97.5°F | Resp 16 | Ht 62.5 in | Wt 152.6 lb

## 2018-03-30 DIAGNOSIS — E119 Type 2 diabetes mellitus without complications: Secondary | ICD-10-CM

## 2018-03-30 DIAGNOSIS — I10 Essential (primary) hypertension: Secondary | ICD-10-CM

## 2018-03-30 DIAGNOSIS — Z5181 Encounter for therapeutic drug level monitoring: Secondary | ICD-10-CM

## 2018-03-30 LAB — POCT GLYCOSYLATED HEMOGLOBIN (HGB A1C): Hemoglobin A1C: 5.7 % — AB (ref 4.0–5.6)

## 2018-03-30 LAB — BASIC METABOLIC PANEL
BUN/Creatinine Ratio: 10 (ref 10–24)
BUN: 13 mg/dL (ref 8–27)
CO2: 25 mmol/L (ref 20–29)
Calcium: 9 mg/dL (ref 8.6–10.2)
Chloride: 105 mmol/L (ref 96–106)
Creatinine, Ser: 1.31 mg/dL — ABNORMAL HIGH (ref 0.76–1.27)
GFR calc Af Amer: 58 mL/min/{1.73_m2} — ABNORMAL LOW (ref >59)
GFR calc non Af Amer: 50 mL/min/{1.73_m2} — ABNORMAL LOW (ref >59)
Glucose: 87 mg/dL (ref 65–99)
Potassium: 3.5 mmol/L (ref 3.5–5.2)
Sodium: 142 mmol/L (ref 134–144)

## 2018-03-30 MED ORDER — LISINOPRIL 40 MG PO TABS: 40.0000 mg | ORAL_TABLET | Freq: Every day | ORAL | 0 refills | Status: DC

## 2018-03-30 NOTE — Patient Instructions (Addendum)
Home health will be coming out to check Blood Pressure   Continue monitoring your home readings   Take all your medications   Return to clinic in 1 month   New medication Lisinopril 40mg  sent to the pharmacy   Hypertension Hypertension, commonly called high blood pressure, is when the force of blood pumping through the arteries is too strong. The arteries are the blood vessels that carry blood from the heart throughout the body. Hypertension forces the heart to work harder to pump blood and may cause arteries to become narrow or stiff. Having untreated or uncontrolled hypertension can cause heart attacks, strokes, kidney disease, and other problems. A blood pressure reading consists of a higher number over a lower number. Ideally, your blood pressure should be below 120/80. The first ("top") number is called the systolic pressure. It is a measure of the pressure in your arteries as your heart beats. The second ("bottom") number is called the diastolic pressure. It is a measure of the pressure in your arteries as the heart relaxes. What are the causes? The cause of this condition is not known. What increases the risk? Some risk factors for high blood pressure are under your control. Others are not. Factors you can change  Smoking.  Having type 2 diabetes mellitus, high cholesterol, or both.  Not getting enough exercise or physical activity.  Being overweight.  Having too much fat, sugar, calories, or salt (sodium) in your diet.  Drinking too much alcohol. Factors that are difficult or impossible to change  Having chronic kidney disease.  Having a family history of high blood pressure.  Age. Risk increases with age.  Race. You may be at higher risk if you are African-American.  Gender. Men are at higher risk than women before age 45. After age 96, women are at higher risk than men.  Having obstructive sleep apnea.  Stress. What are the signs or symptoms? Extremely  high blood pressure (hypertensive crisis) may cause:  Headache.  Anxiety.  Shortness of breath.  Nosebleed.  Nausea and vomiting.  Severe chest pain.  Jerky movements you cannot control (seizures).  How is this diagnosed? This condition is diagnosed by measuring your blood pressure while you are seated, with your arm resting on a surface. The cuff of the blood pressure monitor will be placed directly against the skin of your upper arm at the level of your heart. It should be measured at least twice using the same arm. Certain conditions can cause a difference in blood pressure between your right and left arms. Certain factors can cause blood pressure readings to be lower or higher than normal (elevated) for a short period of time:  When your blood pressure is higher when you are in a health care provider's office than when you are at home, this is called white coat hypertension. Most people with this condition do not need medicines.  When your blood pressure is higher at home than when you are in a health care provider's office, this is called masked hypertension. Most people with this condition may need medicines to control blood pressure.  If you have a high blood pressure reading during one visit or you have normal blood pressure with other risk factors:  You may be asked to return on a different day to have your blood pressure checked again.  You may be asked to monitor your blood pressure at home for 1 week or longer.  If you are diagnosed with hypertension, you may have  other blood or imaging tests to help your health care provider understand your overall risk for other conditions. How is this treated? This condition is treated by making healthy lifestyle changes, such as eating healthy foods, exercising more, and reducing your alcohol intake. Your health care provider may prescribe medicine if lifestyle changes are not enough to get your blood pressure under control, and  if:  Your systolic blood pressure is above 130.  Your diastolic blood pressure is above 80.  Your personal target blood pressure may vary depending on your medical conditions, your age, and other factors. Follow these instructions at home: Eating and drinking  Eat a diet that is high in fiber and potassium, and low in sodium, added sugar, and fat. An example eating plan is called the DASH (Dietary Approaches to Stop Hypertension) diet. To eat this way: ? Eat plenty of fresh fruits and vegetables. Try to fill half of your plate at each meal with fruits and vegetables. ? Eat whole grains, such as whole wheat pasta, brown rice, or whole grain bread. Fill about one quarter of your plate with whole grains. ? Eat or drink low-fat dairy products, such as skim milk or low-fat yogurt. ? Avoid fatty cuts of meat, processed or cured meats, and poultry with skin. Fill about one quarter of your plate with lean proteins, such as fish, chicken without skin, beans, eggs, and tofu. ? Avoid premade and processed foods. These tend to be higher in sodium, added sugar, and fat.  Reduce your daily sodium intake. Most people with hypertension should eat less than 1,500 mg of sodium a day.  Limit alcohol intake to no more than 1 drink a day for nonpregnant women and 2 drinks a day for men. One drink equals 12 oz of beer, 5 oz of wine, or 1 oz of hard liquor. Lifestyle  Work with your health care provider to maintain a healthy body weight or to lose weight. Ask what an ideal weight is for you.  Get at least 30 minutes of exercise that causes your heart to beat faster (aerobic exercise) most days of the week. Activities may include walking, swimming, or biking.  Include exercise to strengthen your muscles (resistance exercise), such as pilates or lifting weights, as part of your weekly exercise routine. Try to do these types of exercises for 30 minutes at least 3 days a week.  Do not use any products that contain  nicotine or tobacco, such as cigarettes and e-cigarettes. If you need help quitting, ask your health care provider.  Monitor your blood pressure at home as told by your health care provider.  Keep all follow-up visits as told by your health care provider. This is important. Medicines  Take over-the-counter and prescription medicines only as told by your health care provider. Follow directions carefully. Blood pressure medicines must be taken as prescribed.  Do not skip doses of blood pressure medicine. Doing this puts you at risk for problems and can make the medicine less effective.  Ask your health care provider about side effects or reactions to medicines that you should watch for. Contact a health care provider if:  You think you are having a reaction to a medicine you are taking.  You have headaches that keep coming back (recurring).  You feel dizzy.  You have swelling in your ankles.  You have trouble with your vision. Get help right away if:  You develop a severe headache or confusion.  You have unusual weakness or numbness.  You feel faint.  You have severe pain in your chest or abdomen.  You vomit repeatedly.  You have trouble breathing. Summary  Hypertension is when the force of blood pumping through your arteries is too strong. If this condition is not controlled, it may put you at risk for serious complications.  Your personal target blood pressure may vary depending on your medical conditions, your age, and other factors. For most people, a normal blood pressure is less than 120/80.  Hypertension is treated with lifestyle changes, medicines, or a combination of both. Lifestyle changes include weight loss, eating a healthy, low-sodium diet, exercising more, and limiting alcohol. This information is not intended to replace advice given to you by your health care provider. Make sure you discuss any questions you have with your health care provider. Document  Released: 09/14/2005 Document Revised: 08/12/2016 Document Reviewed: 08/12/2016 Elsevier Interactive Patient Education  [employer].

## 2018-04-02 ENCOUNTER — Encounter: Payer: Self-pay | Admitting: Family Medicine

## 2018-05-05 ENCOUNTER — Ambulatory Visit (INDEPENDENT_AMBULATORY_CARE_PROVIDER_SITE_OTHER): Payer: PPO | Admitting: Family Medicine

## 2018-05-05 ENCOUNTER — Encounter: Payer: Self-pay | Admitting: Family Medicine

## 2018-05-05 ENCOUNTER — Other Ambulatory Visit: Payer: Self-pay

## 2018-05-05 VITALS — BP 170/88 | HR 57 | Temp 97.6°F | Resp 16 | Ht 62.5 in | Wt 149.0 lb

## 2018-05-05 DIAGNOSIS — I10 Essential (primary) hypertension: Secondary | ICD-10-CM

## 2018-05-05 MED ORDER — HYDROCHLOROTHIAZIDE 25 MG PO TABS: 25.0000 mg | ORAL_TABLET | Freq: Every day | ORAL | 3 refills | Status: DC

## 2018-05-05 NOTE — Progress Notes (Signed)
Chief Complaint  Patient presents with  . Hypertension    one month f/u    HPI   Patient's home bp reading has been elevated His daughter-in-law has been checking his bps He takes lisinopril 40mg  daily He took his bp meds this morning He denies side effects: no chest pain, sob, wheezing, dizziness, cough 04/06/18 195/109, pulse 52 04/11/18  205/104, pulse 56 04/27/18 183/104, pulse 53 05/03/18 169/103, pulse 52 05/04/18 185/111, pulse 53 05/05/18 186/90, pulse 52  Lab Results  Component Value Date   CREATININE 1.31 (H) 03/30/2018   Home health has not come out to the house as yet  He was on coreg and lisionpril-hctz a year ago He was taken off bp meds due to iatrogenic hypotension and weakness      Past Medical History:  Diagnosis Date  . Colon polyps 06/22/2014   4 colon polyps by colonoscopy.  Repeat 3 years.  Hung.  . Diabetes mellitus   . Hypertension   . Other and unspecified hyperlipidemia   . PE (pulmonary embolism)     Current Outpatient Medications  Medication Sig Dispense Refill  . aspirin 81 MG tablet Take 81 mg by mouth daily.    Marland Kitchen lisinopril (PRINIVIL,ZESTRIL) 40 MG tablet Take 1 tablet (40 mg total) by mouth daily. 90 tablet 0  . metFORMIN (GLUCOPHAGE) 500 MG tablet Take 1 tablet (500 mg total) by mouth daily with breakfast. 90 tablet 1  . OVER THE COUNTER MEDICATION Blood pressure factors  One pill per day    . OVER THE COUNTER MEDICATION Glucose support One pill per day    . hydrochlorothiazide (HYDRODIURIL) 25 MG tablet Take 1 tablet (25 mg total) by mouth daily. 90 tablet 3   No current facility-administered medications for this visit.     Allergies: No Known Allergies  Past Surgical History:  Procedure Laterality Date  . COLONOSCOPY W/ POLYPECTOMY  06/22/2014   4 colon polyps; repeat 3 years; [place].    Social History   Socioeconomic History  . Marital status: Single    Spouse name: Not on file  . Number of children: Not on file  . Years  of education: Not on file  . Highest education level: Not on file  Occupational History  . Not on file  Social Needs  . Financial resource strain: Not on file  . Food insecurity:    Worry: Not on file    Inability: Not on file  . Transportation needs:    Medical: Not on file    Non-medical: Not on file  Tobacco Use  . Smoking status: Former Smoker    Years: 17.00    Types: Cigars    Last attempt to quit: 09/29/1975    Years since quitting: 42.6  . Smokeless tobacco: Never Used  Substance and Sexual Activity  . Alcohol use: No  . Drug use: No  . Sexual activity: Not Currently  Lifestyle  . Physical activity:    Days per week: Not on file    Minutes per session: Not on file  . Stress: Not on file  Relationships  . Social connections:    Talks on phone: Not on file    Gets together: Not on file    Attends religious service: Not on file    Active member of club or organization: Not on file    Attends meetings of clubs or organizations: Not on file    Relationship status: Not on file  Other Topics Concern  .  Not on file  Social History Narrative   Marital status: widowed x 2; married x 22 years first marriage; second marriage x 45.  Not dating.      Children:  3 children; 3 grandchildren; 3 great-grandchildren.      Lives: with son      Employment: retired in 2012; truck Geophysicist/field seismologist      Tobacco: quit in 1977; cigars and pipes      Alcohol: none      Exercise:  Walking 3 days per week in park.      ADLs: drives; walks unassisted; daughter-in-law does laundry; goes to grocery store.    Family History  Problem Relation Age of Onset  . Diabetes Other      ROS Review of Systems See HPI Constitution: No fevers or chills No malaise No diaphoresis Skin: No rash or itching Eyes: no blurry vision, no double vision GU: no dysuria or hematuria Neuro: no dizziness or headaches all others reviewed and negative   Objective: Vitals:   05/05/18 0913  BP: (!) 170/88  Pulse:  (!) 57  Resp: 16  Temp: 97.6 F (36.4 C)  TempSrc: Oral  SpO2: 99%  Weight: 149 lb (67.6 kg)  Height: 5' 2.5" (1.588 m)    Physical Exam  Constitutional: He is oriented to person, place, and time. He appears well-developed and well-nourished.  HENT:  Head: Normocephalic and atraumatic.  Eyes: Conjunctivae and EOM are normal.  Cardiovascular: Normal rate, regular rhythm and normal heart sounds.  No murmur heard. Pulmonary/Chest: Effort normal and breath sounds normal. No stridor. No respiratory distress. He has no wheezes.  Neurological: He is alert and oriented to person, place, and time.  Skin: Skin is warm. Capillary refill takes less than 2 seconds.  Psychiatric: He has a normal mood and affect. His behavior is normal. Judgment and thought content normal.     Assessment and Plan Tesean was seen today for hypertension.  Diagnoses and all orders for this visit:  Uncontrolled hypertension  Other orders -     hydrochlorothiazide (HYDRODIURIL) 25 MG tablet; Take 1 tablet (25 mg total) by mouth daily.   Added hctz Discussed that I will attempt to get home health back out to do bp checks Although he was on coreg in the past his home pulse is generally less than 60 so will not resume beta blocker in a geriatric diabetic with low pulses at home Communicated with his son and wrote out details on his avs for his daughter-in-law  Forrest Moron

## 2018-05-05 NOTE — Patient Instructions (Addendum)
Your new medication for your blood pressure is Hydrochlorothiazide (HCTZ).  You were on this medication before but we stopped it when your blood pressure got too low and you were dizzy.  HCTZ is a water pill so take it in the morning.  It helps get rid of the excess salts.  If you get muscle cramps you can take over the counter magnesium and calcium.  Or just eat a banana.    For leg cramps you can try magnesium (Slow-Mag or Mag-Tab-SR are best, 2 tablets twice or three times daily). Also calcium 500 to 1000 mg daily (tends to be constipating) and/or potassium (such as potassium chloride 750 mg or 8 mEq daily, use a slow-release formulation).  These minerals are available over-the-counter. Note that Slow-Mag contains both magnesium and calcium.  Continue to check your blood pressures at home A good range is 093-267 systolic (top number) over 12-45 diastolic (bottom number)  Follow up in 1 month for blood pressure recheck   IF you received an x-ray today, you will receive an invoice from [facility]. Please contact [facility] at [phone] with questions or concerns regarding your invoice.   IF you received labwork today, you will receive an invoice from [facility]. Please contact [facility] at [phone] with questions or concerns regarding your invoice.   Our billing staff will not be able to assist you with questions regarding bills from these companies.  You will be contacted with the lab results as soon as they are available. The fastest way to get your results is to activate your My Chart account. Instructions are located on the last page of this paperwork. If you have not heard from [place] regarding the results in 2 weeks, please contact this office.

## 2018-06-07 NOTE — Progress Notes (Deleted)
No chief complaint on file.   HPI  4 review of systems  Past Medical History:  Diagnosis Date  . Colon polyps 06/22/2014   4 colon polyps by colonoscopy.  Repeat 3 years.  Hung.  . Diabetes mellitus   . Hypertension   . Other and unspecified hyperlipidemia   . PE (pulmonary embolism)     Current Outpatient Medications  Medication Sig Dispense Refill  . aspirin 81 MG tablet Take 81 mg by mouth daily.    . hydrochlorothiazide (HYDRODIURIL) 25 MG tablet Take 1 tablet (25 mg total) by mouth daily. 90 tablet 3  . lisinopril (PRINIVIL,ZESTRIL) 40 MG tablet Take 1 tablet (40 mg total) by mouth daily. 90 tablet 0  . metFORMIN (GLUCOPHAGE) 500 MG tablet Take 1 tablet (500 mg total) by mouth daily with breakfast. 90 tablet 1  . OVER THE COUNTER MEDICATION Blood pressure factors  One pill per day    . OVER THE COUNTER MEDICATION Glucose support One pill per day     No current facility-administered medications for this visit.     Allergies: No Known Allergies  Past Surgical History:  Procedure Laterality Date  . COLONOSCOPY W/ POLYPECTOMY  06/22/2014   4 colon polyps; repeat 3 years; [place].    Social History   Socioeconomic History  . Marital status: Single    Spouse name: Not on file  . Number of children: Not on file  . Years of education: Not on file  . Highest education level: Not on file  Occupational History  . Not on file  Social Needs  . Financial resource strain: Not on file  . Food insecurity:    Worry: Not on file    Inability: Not on file  . Transportation needs:    Medical: Not on file    Non-medical: Not on file  Tobacco Use  . Smoking status: Former Smoker    Years: 17.00    Types: Cigars    Last attempt to quit: 09/29/1975    Years since quitting: 42.7  . Smokeless tobacco: Never Used  Substance and Sexual Activity  . Alcohol use: No  . Drug use: No  . Sexual activity: Not Currently  Lifestyle  . Physical activity:    Days per week: Not on file      Minutes per session: Not on file  . Stress: Not on file  Relationships  . Social connections:    Talks on phone: Not on file    Gets together: Not on file    Attends religious service: Not on file    Active member of club or organization: Not on file    Attends meetings of clubs or organizations: Not on file    Relationship status: Not on file  Other Topics Concern  . Not on file  Social History Narrative   Marital status: widowed x 2; married x 22 years first marriage; second marriage x 51.  Not dating.      Children:  3 children; 3 grandchildren; 3 great-grandchildren.      Lives: with son      Employment: retired in 2012; truck Geophysicist/field seismologist      Tobacco: quit in 1977; cigars and pipes      Alcohol: none      Exercise:  Walking 3 days per week in park.      ADLs: drives; walks unassisted; daughter-in-law does laundry; goes to grocery store.    Family History  Problem Relation Age of Onset  .  Diabetes Other      ROS Review of Systems See HPI Constitution: No fevers or chills No malaise No diaphoresis Skin: No rash or itching Eyes: no blurry vision, no double vision GU: no dysuria or hematuria Neuro: no dizziness or headaches * all others reviewed and negative   Objective: There were no vitals filed for this visit.  Physical Exam  Assessment and Plan There are no diagnoses linked to this encounter.   Sergio [name]

## 2018-06-08 ENCOUNTER — Ambulatory Visit: Payer: PPO | Admitting: Family Medicine

## 2018-06-09 ENCOUNTER — Encounter: Payer: Self-pay | Admitting: Family Medicine

## 2018-06-09 ENCOUNTER — Other Ambulatory Visit: Payer: Self-pay

## 2018-06-09 ENCOUNTER — Ambulatory Visit (INDEPENDENT_AMBULATORY_CARE_PROVIDER_SITE_OTHER): Payer: PPO | Admitting: Family Medicine

## 2018-06-09 VITALS — BP 136/74 | HR 80 | Temp 97.8°F | Resp 16 | Ht 62.5 in | Wt 149.8 lb

## 2018-06-09 DIAGNOSIS — Z23 Encounter for immunization: Secondary | ICD-10-CM | POA: Diagnosis not present

## 2018-06-09 DIAGNOSIS — N181 Chronic kidney disease, stage 1: Secondary | ICD-10-CM

## 2018-06-09 DIAGNOSIS — I129 Hypertensive chronic kidney disease with stage 1 through stage 4 chronic kidney disease, or unspecified chronic kidney disease: Secondary | ICD-10-CM

## 2018-06-09 NOTE — Patient Instructions (Addendum)
Return in 2 months for hypertension and blood pressure check Take your medications that morning     If you have lab work done today you will be contacted with your lab results within the next 2 weeks.  If you have not heard from [place] then please contact us. The fastest way to get your results is to register for My Chart.   IF you received an x-ray today, you will receive an invoice from [facility]. Please contact [facility] at [phone] with questions or concerns regarding your invoice.   IF you received labwork today, you will receive an invoice from [facility]. Please contact [facility] at [phone] with questions or concerns regarding your invoice.   Our billing staff will not be able to assist you with questions regarding bills from these companies.  You will be contacted with the lab results as soon as they are available. The fastest way to get your results is to activate your My Chart account. Instructions are located on the last page of this paperwork. If you have not heard from [place] regarding the results in 2 weeks, please contact this office.     Hypertension Hypertension, commonly called high blood pressure, is when the force of blood pumping through the arteries is too strong. The arteries are the blood vessels that carry blood from the heart throughout the body. Hypertension forces the heart to work harder to pump blood and may cause arteries to become narrow or stiff. Having untreated or uncontrolled hypertension can cause heart attacks, strokes, kidney disease, and other problems. A blood pressure reading consists of a higher number over a lower number. Ideally, your blood pressure should be below 120/80. The first ("top") number is called the systolic pressure. It is a measure of the pressure in your arteries as your heart beats. The second ("bottom") number is called the diastolic pressure. It is a measure of the pressure in your arteries as the heart  relaxes. What are the causes? The cause of this condition is not known. What increases the risk? Some risk factors for high blood pressure are under your control. Others are not. Factors you can change  Smoking.  Having type 2 diabetes mellitus, high cholesterol, or both.  Not getting enough exercise or physical activity.  Being overweight.  Having too much fat, sugar, calories, or salt (sodium) in your diet.  Drinking too much alcohol. Factors that are difficult or impossible to change  Having chronic kidney disease.  Having a family history of high blood pressure.  Age. Risk increases with age.  Race. You may be at higher risk if you are African-American.  Gender. Men are at higher risk than women before age 54. After age 32, women are at higher risk than men.  Having obstructive sleep apnea.  Stress. What are the signs or symptoms? Extremely high blood pressure (hypertensive crisis) may cause:  Headache.  Anxiety.  Shortness of breath.  Nosebleed.  Nausea and vomiting.  Severe chest pain.  Jerky movements you cannot control (seizures).  How is this diagnosed? This condition is diagnosed by measuring your blood pressure while you are seated, with your arm resting on a surface. The cuff of the blood pressure monitor will be placed directly against the skin of your upper arm at the level of your heart. It should be measured at least twice using the same arm. Certain conditions can cause a difference in blood pressure between your right and left arms. Certain factors can cause blood pressure readings  to be lower or higher than normal (elevated) for a short period of time:  When your blood pressure is higher when you are in a health care provider's office than when you are at home, this is called white coat hypertension. Most people with this condition do not need medicines.  When your blood pressure is higher at home than when you are in a health care provider's  office, this is called masked hypertension. Most people with this condition may need medicines to control blood pressure.  If you have a high blood pressure reading during one visit or you have normal blood pressure with other risk factors:  You may be asked to return on a different day to have your blood pressure checked again.  You may be asked to monitor your blood pressure at home for 1 week or longer.  If you are diagnosed with hypertension, you may have other blood or imaging tests to help your health care provider understand your overall risk for other conditions. How is this treated? This condition is treated by making healthy lifestyle changes, such as eating healthy foods, exercising more, and reducing your alcohol intake. Your health care provider may prescribe medicine if lifestyle changes are not enough to get your blood pressure under control, and if:  Your systolic blood pressure is above 130.  Your diastolic blood pressure is above 80.  Your personal target blood pressure may vary depending on your medical conditions, your age, and other factors. Follow these instructions at home: Eating and drinking  Eat a diet that is high in fiber and potassium, and low in sodium, added sugar, and fat. An example eating plan is called the DASH (Dietary Approaches to Stop Hypertension) diet. To eat this way: ? Eat plenty of fresh fruits and vegetables. Try to fill half of your plate at each meal with fruits and vegetables. ? Eat whole grains, such as whole wheat pasta, brown rice, or whole grain bread. Fill about one quarter of your plate with whole grains. ? Eat or drink low-fat dairy products, such as skim milk or low-fat yogurt. ? Avoid fatty cuts of meat, processed or cured meats, and poultry with skin. Fill about one quarter of your plate with lean proteins, such as fish, chicken without skin, beans, eggs, and tofu. ? Avoid premade and processed foods. These tend to be higher in  sodium, added sugar, and fat.  Reduce your daily sodium intake. Most people with hypertension should eat less than 1,500 mg of sodium a day.  Limit alcohol intake to no more than 1 drink a day for nonpregnant women and 2 drinks a day for men. One drink equals 12 oz of beer, 5 oz of wine, or 1 oz of hard liquor. Lifestyle  Work with your health care provider to maintain a healthy body weight or to lose weight. Ask what an ideal weight is for you.  Get at least 30 minutes of exercise that causes your heart to beat faster (aerobic exercise) most days of the week. Activities may include walking, swimming, or biking.  Include exercise to strengthen your muscles (resistance exercise), such as pilates or lifting weights, as part of your weekly exercise routine. Try to do these types of exercises for 30 minutes at least 3 days a week.  Do not use any products that contain nicotine or tobacco, such as cigarettes and e-cigarettes. If you need help quitting, ask your health care provider.  Monitor your blood pressure at home as told by  your health care provider.  Keep all follow-up visits as told by your health care provider. This is important. Medicines  Take over-the-counter and prescription medicines only as told by your health care provider. Follow directions carefully. Blood pressure medicines must be taken as prescribed.  Do not skip doses of blood pressure medicine. Doing this puts you at risk for problems and can make the medicine less effective.  Ask your health care provider about side effects or reactions to medicines that you should watch for. Contact a health care provider if:  You think you are having a reaction to a medicine you are taking.  You have headaches that keep coming back (recurring).  You feel dizzy.  You have swelling in your ankles.  You have trouble with your vision. Get help right away if:  You develop a severe headache or confusion.  You have unusual  weakness or numbness.  You feel faint.  You have severe pain in your chest or abdomen.  You vomit repeatedly.  You have trouble breathing. Summary  Hypertension is when the force of blood pumping through your arteries is too strong. If this condition is not controlled, it may put you at risk for serious complications.  Your personal target blood pressure may vary depending on your medical conditions, your age, and other factors. For most people, a normal blood pressure is less than 120/80.  Hypertension is treated with lifestyle changes, medicines, or a combination of both. Lifestyle changes include weight loss, eating a healthy, low-sodium diet, exercising more, and limiting alcohol. This information is not intended to replace advice given to you by your health care provider. Make sure you discuss any questions you have with your health care provider. Document Released: 09/14/2005 Document Revised: 08/12/2016 Document Reviewed: 08/12/2016 Elsevier Interactive Patient Education  [employer].

## 2018-06-09 NOTE — Progress Notes (Signed)
Chief Complaint  Patient presents with  . medication follow up    HPI  Hypertension: Patient here for follow-up of elevated blood pressure. He is adherent to low salt diet.  Blood pressure is well controlled at home. Cardiac symptoms none. Patient denies chest pain, chest pressure/discomfort, claudication, dyspnea, exertional chest pressure/discomfort, irregular heart beat, lower extremity edema, near-syncope, palpitations and paroxysmal nocturnal dyspnea.  Cardiovascular risk factors: advanced age (older than 57 for men, 15 for women) and hypertension. History of target organ damage: chronic kidney disease. BP Readings from Last 3 Encounters:  06/09/18 136/74  05/05/18 (!) 170/88  03/30/18 (!) 203/78   Lab Results  Component Value Date   CREATININE 1.31 (H) 03/30/2018     Past Medical History:  Diagnosis Date  . Colon polyps 06/22/2014   4 colon polyps by colonoscopy.  Repeat 3 years.  Hung.  . Diabetes mellitus   . Hypertension   . Other and unspecified hyperlipidemia   . PE (pulmonary embolism)     Current Outpatient Medications  Medication Sig Dispense Refill  . aspirin 81 MG tablet Take 81 mg by mouth daily.    . hydrochlorothiazide (HYDRODIURIL) 25 MG tablet Take 1 tablet (25 mg total) by mouth daily. 90 tablet 3  . lisinopril (PRINIVIL,ZESTRIL) 40 MG tablet Take 1 tablet (40 mg total) by mouth daily. 90 tablet 0  . metFORMIN (GLUCOPHAGE) 500 MG tablet Take 1 tablet (500 mg total) by mouth daily with breakfast. 90 tablet 1  . OVER THE COUNTER MEDICATION Blood pressure factors  One pill per day    . OVER THE COUNTER MEDICATION Glucose support One pill per day     No current facility-administered medications for this visit.     Allergies: No Known Allergies  Past Surgical History:  Procedure Laterality Date  . COLONOSCOPY W/ POLYPECTOMY  06/22/2014   4 colon polyps; repeat 3 years; [place].    Social History   Socioeconomic History  . Marital status: Single   Spouse name: Not on file  . Number of children: Not on file  . Years of education: Not on file  . Highest education level: Not on file  Occupational History  . Not on file  Social Needs  . Financial resource strain: Not on file  . Food insecurity:    Worry: Not on file    Inability: Not on file  . Transportation needs:    Medical: Not on file    Non-medical: Not on file  Tobacco Use  . Smoking status: Former Smoker    Years: 17.00    Types: Cigars    Last attempt to quit: 09/29/1975    Years since quitting: 42.7  . Smokeless tobacco: Never Used  Substance and Sexual Activity  . Alcohol use: No  . Drug use: No  . Sexual activity: Not Currently  Lifestyle  . Physical activity:    Days per week: Not on file    Minutes per session: Not on file  . Stress: Not on file  Relationships  . Social connections:    Talks on phone: Not on file    Gets together: Not on file    Attends religious service: Not on file    Active member of club or organization: Not on file    Attends meetings of clubs or organizations: Not on file    Relationship status: Not on file  Other Topics Concern  . Not on file  Social History Narrative   Marital status: widowed x  2; married x 22 years first marriage; second marriage x 110.  Not dating.      Children:  3 children; 3 grandchildren; 3 great-grandchildren.      Lives: with son      Employment: retired in 2012; truck Geophysicist/field seismologist      Tobacco: quit in 1977; cigars and pipes      Alcohol: none      Exercise:  Walking 3 days per week in park.      ADLs: drives; walks unassisted; daughter-in-law does laundry; goes to grocery store.    Family History  Problem Relation Age of Onset  . Diabetes Other      ROS Review of Systems See HPI [place]: No fevers or chills No malaise No diaphoresis Skin: No rash or itching Eyes: no blurry vision, no double vision GU: no dysuria or hematuria Neuro: no dizziness or headaches all others reviewed and  negative   Objective: Vitals:   06/09/18 1103  BP: 136/74  Pulse: 80  Resp: 16  Temp: 97.8 F (36.6 C)  TempSrc: Oral  SpO2: 96%  Weight: 149 lb 12.8 oz (67.9 kg)  Height: 5' 2.5" (1.588 m)    Physical Exam  Constitutional: He is oriented to person, place, and time. He appears well-developed and well-nourished.  HENT:  Head: Normocephalic and atraumatic.  Eyes: Conjunctivae and EOM are normal.  Neck: Normal range of motion. Neck supple.  Cardiovascular: Normal rate, regular rhythm and normal heart sounds.  Pulmonary/Chest: Effort normal and breath sounds normal. No stridor. No respiratory distress.  Neurological: He is alert and oriented to person, place, and time.  Skin: Skin is warm. Capillary refill takes less than 2 seconds.  Psychiatric: He has a normal mood and affect. His behavior is normal. Judgment and thought content normal.    Assessment and Plan Gordon was seen today for medication follow up.  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

## 2018-07-10 ENCOUNTER — Other Ambulatory Visit: Payer: Self-pay | Admitting: Family Medicine

## 2018-07-10 DIAGNOSIS — I10 Essential (primary) hypertension: Secondary | ICD-10-CM

## 2018-10-24 ENCOUNTER — Other Ambulatory Visit: Payer: Self-pay | Admitting: Family Medicine

## 2018-10-25 NOTE — Telephone Encounter (Signed)
Requested Prescriptions  Pending Prescriptions Disp Refills  . metFORMIN (GLUCOPHAGE) 500 MG tablet [Pharmacy Med Name: metFORMIN HCl 500 MG Oral Tablet] 90 tablet 0    Sig: TAKE 1 TABLET BY MOUTH DAILY WITH BREAKFAST     Endocrinology:  Diabetes - Biguanides Failed - 10/24/2018  9:20 PM      Failed - Cr in normal range and within 360 days    Creat  Date Value Ref Range Status  07/04/2014 1.43 (H) 0.50 - 1.35 mg/dL Final   Creatinine, Ser  Date Value Ref Range Status  03/30/2018 1.31 (H) 0.76 - 1.27 mg/dL Final         Failed - HBA1C is between 0 and 7.9 and within 180 days    Hemoglobin A1C  Date Value Ref Range Status  03/30/2018 5.7 (A) 4.0 - 5.6 % Final   Hgb A1c MFr Bld  Date Value Ref Range Status  02/12/2011 5.4 4.0 - 6.0 %          Failed - eGFR in normal range and within 360 days    GFR, Est African American  Date Value Ref Range Status  04/02/2014 67 mL/min Final   GFR calc Af Amer  Date Value Ref Range Status  03/30/2018 58 (L) >59 mL/min/1.73 Final   GFR, Est Non African American  Date Value Ref Range Status  04/02/2014 58 (L) mL/min Final    Comment:      The estimated GFR is a calculation valid for adults (>=16 years old) that uses the CKD-EPI algorithm to adjust for age and sex. It is   not to be used for children, pregnant women, hospitalized patients,    patients on dialysis, or with rapidly changing kidney function. According to the NKDEP, eGFR >89 is normal, 60-89 shows mild impairment, 30-59 shows moderate impairment, 15-29 shows severe impairment and <15 is ESRD.     GFR calc non Af Amer  Date Value Ref Range Status  03/30/2018 50 (L) >59 mL/min/1.73 Final         Passed - Valid encounter within last 6 months    Recent Outpatient Visits          4 months ago Benign hypertension with CKD (chronic kidney disease) stage I   [facility] at [place], MD   5 months ago Uncontrolled hypertension   Primary Care at [facility], [place] A, MD   6 months ago Type 2 diabetes mellitus without complication, without long-term current use of insulin ([place])   Primary Care at [facility], [place] A, MD   9 months ago Type 2 diabetes mellitus without complication, without long-term current use of insulin ([place])   Primary Care at [place], MD   11 months ago Uncontrolled hypertension   Primary Care at [facility], Arlie Solomons, MD           per letter from Dr. Nolon Rod 04/02/18: Kidney Function tests look good; POCT Hgb A1C 03/2018 under control.  Left vm. On pt's phone that he is overdue for his 2 mo. F/u for htn., and advised him to call and schedule this.

## 2019-02-05 ENCOUNTER — Other Ambulatory Visit: Payer: Self-pay | Admitting: Family Medicine

## 2019-02-05 DIAGNOSIS — I10 Essential (primary) hypertension: Secondary | ICD-10-CM

## 2019-02-08 ENCOUNTER — Other Ambulatory Visit: Payer: Self-pay | Admitting: Family Medicine

## 2019-02-08 DIAGNOSIS — I10 Essential (primary) hypertension: Secondary | ICD-10-CM

## 2019-03-16 ENCOUNTER — Other Ambulatory Visit: Payer: Self-pay | Admitting: Family Medicine

## 2019-03-16 DIAGNOSIS — I10 Essential (primary) hypertension: Secondary | ICD-10-CM

## 2019-03-21 ENCOUNTER — Other Ambulatory Visit: Payer: Self-pay | Admitting: Family Medicine

## 2019-03-21 DIAGNOSIS — I10 Essential (primary) hypertension: Secondary | ICD-10-CM

## 2019-03-21 NOTE — Telephone Encounter (Signed)
#  30 given in May with note stating pt needs OV

## 2019-05-23 ENCOUNTER — Ambulatory Visit (INDEPENDENT_AMBULATORY_CARE_PROVIDER_SITE_OTHER): Payer: PPO | Admitting: Family Medicine

## 2019-05-23 ENCOUNTER — Encounter: Payer: Self-pay | Admitting: Family Medicine

## 2019-05-23 ENCOUNTER — Other Ambulatory Visit: Payer: Self-pay

## 2019-05-23 VITALS — BP 140/67 | HR 72 | Temp 98.2°F | Resp 12 | Wt 154.2 lb

## 2019-05-23 DIAGNOSIS — E119 Type 2 diabetes mellitus without complications: Secondary | ICD-10-CM | POA: Diagnosis not present

## 2019-05-23 DIAGNOSIS — I10 Essential (primary) hypertension: Secondary | ICD-10-CM | POA: Diagnosis not present

## 2019-05-23 DIAGNOSIS — Z23 Encounter for immunization: Secondary | ICD-10-CM

## 2019-05-23 DIAGNOSIS — Z9181 History of falling: Secondary | ICD-10-CM

## 2019-05-23 MED ORDER — LISINOPRIL 40 MG PO TABS: 40.0000 mg | ORAL_TABLET | Freq: Every day | ORAL | 3 refills | Status: DC

## 2019-05-23 NOTE — Progress Notes (Signed)
Patient ID: DANIELJOSEPH ULVEN, male    DOB: 1933/02/25  Age: 85 y.o. MRN: [id number]  Chief Complaint  Patient presents with  . Fall    Patient stated he only fell yday but he did not get hurt. He stated he do not fall alot.Patient stated he was outside cutting grass and just got too hot  . Hypertension    Patient stated his bp has been running high been running good for the past few day    Subjective:   85 year old man who is here, sent in by his daughter-in-law.  Initially I could not reach her but finally late after the exam I did talk to her.  He apparently was in a hot truck while his son was doing some work and he fell backwards.  He says he just fell when he leaned back.  It is not certain whether he had a syncopal episode or not.  He has had several little falls but no clear-cut syncope's.  He denied any symptoms, was alert promptly.  He is on blood pressure medications but blood pressures have been running in the 151-191/76-90 range with a pulse 58-60 according to his daughters reading.  He has had blood sugars in the 70s 80s and 90s.  She no longer gives him his metformin.  He denies any dizziness.  Gets occasional headache.  No chest pains or shortness of breath.  No ankle edema.  His daughter-in-law says he gets a little forgetful, but he says he does not have any problem with his memory.  He has good appetite and he sleeps okay.  Current allergies, medications, problem list, past/family and social histories reviewed.  Objective:  BP 140/67 (BP Location: Right Arm, Patient Position: Sitting, Cuff Size: Normal)   Pulse 72   Temp 98.2 F (36.8 C) (Oral)   Resp 12   Wt 154 lb 3.2 oz (69.9 kg)   SpO2 99%   BMI 27.75 kg/m   No major distress.  Alert and oriented.  TMs normal.  Throat clear.  Only has 1 tooth.  Neck supple without nodes.  Chest clear.  Heart rate without murmurs.  He is a little bradycardic, with heart rate in the low 60s.  No ankle edema.  Assessment & Plan:    Assessment: 1. Essential hypertension   2. History of fall   3. Type 2 diabetes mellitus without complication, without long-term current use of insulin ([place])       Plan: See instructions.  No orders of the defined types were placed in this encounter.   No orders of the defined types were placed in this encounter.        Patient Instructions    Continue taking the lisinopril 1 daily.  Decrease the hydrochlorothiazide to 1/2 pill daily  Continue taking the aspirin daily.  Discontinue the metformin.  Let us know if his blood sugars start running too high, above 150.  Return in 4 months for recheck  We will let you know the results of the labs.  He is getting his flu shot today.  If he has further falls bring him back in sooner.  If you have lab work done today you will be contacted with your lab results within the next 2 weeks.  If you have not heard from [place] then please contact us. The fastest way to get your results is to register for My Chart.   IF you received an x-ray today, you will receive an invoice from [facility].  Please contact [facility] at [phone] with questions or concerns regarding your invoice.   IF you received labwork today, you will receive an invoice from [place]. Please contact [facility] at [phone] with questions or concerns regarding your invoice.   Our billing staff will not be able to assist you with questions regarding bills from these companies.  You will be contacted with the lab results as soon as they are available. The fastest way to get your results is to activate your My Chart account. Instructions are located on the last page of this paperwork. If you have not heard from [place] regarding the results in 2 weeks, please contact this office.         Return in about 6 months (around 11/23/2019) for [facility].   Ruben Reason, MD 05/23/2019

## 2019-05-23 NOTE — Patient Instructions (Addendum)
  Continue taking the lisinopril 1 daily.  Decrease the hydrochlorothiazide to 1/2 pill daily  Continue taking the aspirin daily.  Discontinue the metformin.  Let us know if his blood sugars start running too high, above 150.  Return in 4 months for recheck  We will let you know the results of the labs.  He is getting his flu shot today.  If he has further falls bring him back in sooner.  If you have lab work done today you will be contacted with your lab results within the next 2 weeks.  If you have not heard from [place] then please contact us. The fastest way to get your results is to register for My Chart.   IF you received an x-ray today, you will receive an invoice from [facility]. Please contact [facility] at [phone] with questions or concerns regarding your invoice.   IF you received labwork today, you will receive an invoice from [place]. Please contact [facility] at [phone] with questions or concerns regarding your invoice.   Our billing staff will not be able to assist you with questions regarding bills from these companies.  You will be contacted with the lab results as soon as they are available. The fastest way to get your results is to activate your My Chart account. Instructions are located on the last page of this paperwork. If you have not heard from [place] regarding the results in 2 weeks, please contact this office.

## 2019-05-24 ENCOUNTER — Other Ambulatory Visit: Payer: Self-pay | Admitting: Family Medicine

## 2019-05-24 LAB — CBC
Hematocrit: 38.5 % (ref 37.5–51.0)
Hemoglobin: 13 g/dL (ref 13.0–17.7)
MCH: 28 pg (ref 26.6–33.0)
MCHC: 33.8 g/dL (ref 31.5–35.7)
MCV: 83 fL (ref 79–97)
Platelets: 204 10*3/uL (ref 150–450)
RBC: 4.64 x10E6/uL (ref 4.14–5.80)
RDW: 14 % (ref 11.6–15.4)
WBC: 5.9 10*3/uL (ref 3.4–10.8)

## 2019-05-24 LAB — COMPREHENSIVE METABOLIC PANEL
ALT: 9 IU/L (ref 0–44)
AST: 12 IU/L (ref 0–40)
Albumin/Globulin Ratio: 1.2 (ref 1.2–2.2)
Albumin: 4.1 g/dL (ref 3.6–4.6)
Alkaline Phosphatase: 48 IU/L (ref 39–117)
BUN/Creatinine Ratio: 14 (ref 10–24)
BUN: 26 mg/dL (ref 8–27)
Bilirubin Total: 0.4 mg/dL (ref 0.0–1.2)
CO2: 23 mmol/L (ref 20–29)
Calcium: 9.2 mg/dL (ref 8.6–10.2)
Chloride: 104 mmol/L (ref 96–106)
Creatinine, Ser: 1.83 mg/dL — ABNORMAL HIGH (ref 0.76–1.27)
GFR calc Af Amer: 38 mL/min/{1.73_m2} — ABNORMAL LOW (ref >59)
GFR calc non Af Amer: 33 mL/min/{1.73_m2} — ABNORMAL LOW (ref >59)
Globulin, Total: 3.5 g/dL (ref 1.5–4.5)
Glucose: 127 mg/dL — ABNORMAL HIGH (ref 65–99)
Potassium: 3.6 mmol/L (ref 3.5–5.2)
Sodium: 143 mmol/L (ref 134–144)
Total Protein: 7.6 g/dL (ref 6.0–8.5)

## 2019-05-24 LAB — HEMOGLOBIN A1C
Est. average glucose Bld gHb Est-mCnc: 126 mg/dL
Hgb A1c MFr Bld: 6 % — ABNORMAL HIGH (ref 4.8–5.6)

## 2019-05-24 NOTE — Telephone Encounter (Signed)
Requested medications are due for refill today?  Yes  Requested medications are on the active medication list?  Yes  Last refill - 05/05/2018, #90, 3 refills   Future visit scheduled?  Yes  Notes to clinic:  Dosage changed by Dr. Linna Darner per office visit note from yesterday.  Will forward to office PEC pool to be addressed.   Requested Prescriptions  Pending Prescriptions Disp Refills   hydrochlorothiazide (HYDRODIURIL) 25 MG tablet [Pharmacy Med Name: hydroCHLOROthiazide 25 MG Oral Tablet] 90 tablet 0    Sig: Take 1 tablet by mouth once daily     Cardiovascular: Diuretics - Thiazide Failed - 05/24/2019  5:30 PM      Failed - Cr in normal range and within 360 days    Creat  Date Value Ref Range Status  07/04/2014 1.43 (H) 0.50 - 1.35 mg/dL Final   Creatinine, Ser  Date Value Ref Range Status  05/23/2019 1.83 (H) 0.76 - 1.27 mg/dL Final         Failed - Last BP in normal range    BP Readings from Last 1 Encounters:  05/23/19 140/67         Failed - Valid encounter within last 6 months    Recent Outpatient Visits          Yesterday Essential hypertension   Primary Care at [facility], Fenton Malling, MD   11 months ago Benign hypertension with CKD (chronic kidney disease) stage I   Primary Care at [facility], Arlie Solomons, MD   1 year ago Uncontrolled hypertension   Primary Care at [facility], [place] A, MD   1 year ago Type 2 diabetes mellitus without complication, without long-term current use of insulin ([place])   Primary Care at [facility], [place] A, MD   1 year ago Type 2 diabetes mellitus without complication, without long-term current use of insulin ([place])   Primary Care at [place], MD      Future Appointments            In 6 months Forrest Moron, MD Primary Care at [place], [place] in normal range and within 360 days    Calcium  Date Value Ref Range Status  05/23/2019 9.2 8.6 - 10.2 mg/dL Final         Passed - K in normal  range and within 360 days    Potassium  Date Value Ref Range Status  05/23/2019 3.6 3.5 - 5.2 mmol/L Final         Passed - Na in normal range and within 360 days    Sodium  Date Value Ref Range Status  05/23/2019 143 134 - 144 mmol/L Final

## 2019-09-11 ENCOUNTER — Other Ambulatory Visit: Payer: Self-pay | Admitting: Family Medicine

## 2019-10-13 ENCOUNTER — Other Ambulatory Visit: Payer: Self-pay

## 2019-10-13 ENCOUNTER — Ambulatory Visit (INDEPENDENT_AMBULATORY_CARE_PROVIDER_SITE_OTHER): Payer: PPO | Admitting: Podiatry

## 2019-10-13 ENCOUNTER — Encounter: Payer: Self-pay | Admitting: Podiatry

## 2019-10-13 DIAGNOSIS — M79675 Pain in left toe(s): Secondary | ICD-10-CM | POA: Diagnosis not present

## 2019-10-13 DIAGNOSIS — B351 Tinea unguium: Secondary | ICD-10-CM

## 2019-10-13 DIAGNOSIS — M79674 Pain in right toe(s): Secondary | ICD-10-CM

## 2019-10-13 DIAGNOSIS — E119 Type 2 diabetes mellitus without complications: Secondary | ICD-10-CM

## 2019-10-13 NOTE — Progress Notes (Signed)
Complaint:  Visit Type: Patient returns to my office for continued preventative foot care services. Complaint: Patient states" my nails have grown long and thick and become painful to walk and wear shoes" Patient has been diagnosed with DM  The patient presents for preventative foot care services.  Podiatric Exam: Vascular: dorsalis pedis  are palpable bilateral.  Posterior tibial pulses are absent  B/L. Capillary return is immediate. Temperature gradient is WNL. Skin turgor WNL  Sensorium: Diminished Semmes Weinstein monofilament test. Normal tactile sensation bilaterally. Nail Exam: Pt has thick disfigured discolored nails with subungual debris noted bilateral entire nail hallux through fifth toenails. Second toenail left has self avulses. Ulcer Exam: There is no evidence of ulcer or pre-ulcerative changes or infection. Orthopedic Exam: Muscle tone and strength are WNL. No limitations in general ROM. No crepitus or effusions noted. Foot type and digits show no abnormalities. Bony prominences are unremarkable. Skin: No Porokeratosis. No infection or ulcers  Diagnosis:  Onychomycosis, , Pain in right toe, pain in left toes  Treatment & Plan Procedures and Treatment: Consent by patient was obtained for treatment procedures.   Debridement of mycotic and hypertrophic toenails, 1 through 5 bilateral and clearing of subungual debris. No ulceration, no infection noted. DSD/Bandage second toe left foot. Return Visit-Office Procedure: Patient instructed to return to the office for a follow up visit 3 months for continued evaluation and treatment.    Gardiner Barefoot DPM

## 2019-10-31 ENCOUNTER — Ambulatory Visit (INDEPENDENT_AMBULATORY_CARE_PROVIDER_SITE_OTHER): Payer: PPO | Admitting: Family Medicine

## 2019-10-31 ENCOUNTER — Encounter: Payer: Self-pay | Admitting: Family Medicine

## 2019-10-31 ENCOUNTER — Other Ambulatory Visit: Payer: Self-pay

## 2019-10-31 VITALS — BP 128/74 | HR 67 | Temp 98.1°F | Wt 156.2 lb

## 2019-10-31 DIAGNOSIS — I10 Essential (primary) hypertension: Secondary | ICD-10-CM

## 2019-10-31 DIAGNOSIS — E119 Type 2 diabetes mellitus without complications: Secondary | ICD-10-CM | POA: Diagnosis not present

## 2019-10-31 DIAGNOSIS — Z23 Encounter for immunization: Secondary | ICD-10-CM

## 2019-10-31 LAB — POCT GLYCOSYLATED HEMOGLOBIN (HGB A1C): Hemoglobin A1C: 6.3 % — AB (ref 4.0–5.6)

## 2019-10-31 MED ORDER — HYDROCHLOROTHIAZIDE 25 MG PO TABS: 25.0000 mg | ORAL_TABLET | Freq: Every day | ORAL | 3 refills | Status: DC

## 2019-10-31 MED ORDER — METFORMIN HCL 500 MG PO TABS: 500.0000 mg | ORAL_TABLET | Freq: Every day | ORAL | 1 refills | Status: DC

## 2019-10-31 MED ORDER — LISINOPRIL 40 MG PO TABS: 40.0000 mg | ORAL_TABLET | Freq: Every day | ORAL | 3 refills | Status: DC

## 2019-10-31 NOTE — Progress Notes (Signed)
Established Patient Office Visit  Subjective:  Patient ID: Sergio [name], male    DOB: 1932/11/14  Age: 86 y.o. MRN: [id number]  CC:  Chief Complaint  Patient presents with  . Hypertension     f/u on bp     HPI [facility] DRACE presents for   Hypertension: Patient here for follow-up of elevated blood pressure. He is exercising and is adherent to low salt diet.  Blood pressure is well controlled at home. Cardiac symptoms none. Patient denies chest pressure/discomfort, dyspnea, exertional chest pressure/discomfort, irregular heart beat and near-syncope.  Cardiovascular risk factors: advanced age (older than 60 for men, 2 for women), diabetes mellitus and hypertension. Use of agents associated with hypertension: none. History of target organ damage: none.  Diabetes Mellitus: Patient presents for follow up of diabetes. Symptoms: none.  Patient denies foot ulcerations, hyperglycemia, hypoglycemia , increase appetite, nausea, paresthesia of the feet, polydipsia, polyuria and visual disturbances.  Evaluation to date has been included: hemoglobin A1C.  Home sugars: patient does not check sugars. Treatment to date: Continued metformin which has been effective.  Wt Readings from Last 3 Encounters:  10/31/19 156 lb 3.2 oz (70.9 kg)  05/23/19 154 lb 3.2 oz (69.9 kg)  06/09/18 149 lb 12.8 oz (67.9 kg)   Lab Results  Component Value Date   HGBA1C 6.0 (H) 05/23/2019      Past Medical History:  Diagnosis Date  . Colon polyps 06/22/2014   4 colon polyps by colonoscopy.  Repeat 3 years.  Hung.  . Diabetes mellitus   . Hypertension   . Other and unspecified hyperlipidemia   . PE (pulmonary embolism)     Past Surgical History:  Procedure Laterality Date  . COLONOSCOPY W/ POLYPECTOMY  06/22/2014   4 colon polyps; repeat 3 years; [place].    Family History  Problem Relation Age of Onset  . Diabetes Other     Social History   Socioeconomic History  . Marital status: Single    Spouse  name: Not on file  . Number of children: Not on file  . Years of education: Not on file  . Highest education level: Not on file  Occupational History  . Not on file  Tobacco Use  . Smoking status: Former Smoker    Years: 17.00    Types: Cigars    Quit date: 09/29/1975    Years since quitting: 44.1  . Smokeless tobacco: Never Used  Substance and Sexual Activity  . Alcohol use: No  . Drug use: No  . Sexual activity: Not Currently  Other Topics Concern  . Not on file  Social History Narrative   Marital status: widowed x 2; married x 22 years first marriage; second marriage x 59.  Not dating.      Children:  3 children; 3 grandchildren; 3 great-grandchildren.      Lives: with son      Employment: retired in 2012; truck Geophysicist/field seismologist      Tobacco: quit in 1977; cigars and pipes      Alcohol: none      Exercise:  Walking 3 days per week in park.      ADLs: drives; walks unassisted; daughter-in-law does laundry; goes to grocery store.   Social Determinants of Health   Financial Resource Strain:   . Difficulty of Paying Living Expenses: Not on file  Food Insecurity:   . Worried About Charity fundraiser in the Last Year: Not on file  . Ran Out of  Food in the Last Year: Not on file  Transportation Needs:   . Lack of Transportation (Medical): Not on file  . Lack of Transportation (Non-Medical): Not on file  Physical Activity:   . Days of Exercise per Week: Not on file  . Minutes of Exercise per Session: Not on file  Stress:   . Feeling of Stress : Not on file  Social Connections:   . Frequency of Communication with Friends and Family: Not on file  . Frequency of Social Gatherings with Friends and Family: Not on file  . Attends Religious Services: Not on file  . Active Member of Clubs or Organizations: Not on file  . Attends Archivist Meetings: Not on file  . Marital Status: Not on file  Intimate Partner Violence:   . Fear of Current or Ex-Partner: Not on file  .  Emotionally Abused: Not on file  . Physically Abused: Not on file  . Sexually Abused: Not on file    Outpatient Medications Prior to Visit  Medication Sig Dispense Refill  . hydrochlorothiazide (HYDRODIURIL) 25 MG tablet Take 1 tablet by mouth once daily 90 tablet 0  . lisinopril (ZESTRIL) 40 MG tablet Take 1 tablet (40 mg total) by mouth daily. 90 tablet 3  . metFORMIN (GLUCOPHAGE) 500 MG tablet TAKE 1 TABLET BY MOUTH DAILY WITH BREAKFAST 90 tablet 0  . OVER THE COUNTER MEDICATION Blood pressure factors  One pill per day    . OVER THE COUNTER MEDICATION Glucose support One pill per day     No facility-administered medications prior to visit.    No Known Allergies  ROS Review of Systems Review of Systems  Constitutional: Negative for activity change, appetite change, chills and fever.  HENT: Negative for congestion, nosebleeds, trouble swallowing and voice change.   Respiratory: Negative for cough, shortness of breath and wheezing.   Gastrointestinal: Negative for diarrhea, nausea and vomiting.  Genitourinary: Negative for difficulty urinating, dysuria, flank pain and hematuria.  Musculoskeletal: Negative for back pain, joint swelling and neck pain.  Neurological: Negative for dizziness, speech difficulty, light-headedness and numbness.  See HPI. All other review of systems negative.     Objective:    Physical Exam  BP 128/74 (BP Location: Left Arm, Patient Position: Sitting, Cuff Size: Normal)   Pulse 67   Temp 98.1 F (36.7 C) (Oral)   Wt 156 lb 3.2 oz (70.9 kg)   SpO2 99%   BMI 28.11 kg/m  Wt Readings from Last 3 Encounters:  10/31/19 156 lb 3.2 oz (70.9 kg)  05/23/19 154 lb 3.2 oz (69.9 kg)  06/09/18 149 lb 12.8 oz (67.9 kg)   Physical Exam  Constitutional: Oriented to person, place, and time. Appears well-developed and well-nourished.  HENT:  Head: Normocephalic and atraumatic.  Eyes: Conjunctivae and EOM are normal.  Cardiovascular: Normal rate, regular  rhythm, normal heart sounds and intact distal pulses.  No murmur heard. Pulmonary/Chest: Effort normal and breath sounds normal. No stridor. No respiratory distress. Has no wheezes.  Neurological: Is alert and oriented to person, place, and time.  Skin: Skin is warm. Capillary refill takes less than 2 seconds.  Psychiatric: Has a normal mood and affect. Behavior is normal. Judgment and thought content normal.    Health Maintenance Due  Topic Date Due  . OPHTHALMOLOGY EXAM  07/24/2015    There are no preventive care reminders to display for this patient.  Lab Results  Component Value Date   TSH 3.900 12/21/2016  Lab Results  Component Value Date   WBC 5.9 05/23/2019   HGB 13.0 05/23/2019   HCT 38.5 05/23/2019   MCV 83 05/23/2019   PLT 204 05/23/2019   Lab Results  Component Value Date   NA 143 05/23/2019   K 3.6 05/23/2019   CO2 23 05/23/2019   GLUCOSE 127 (H) 05/23/2019   BUN 26 05/23/2019   CREATININE 1.83 (H) 05/23/2019   BILITOT 0.4 05/23/2019   ALKPHOS 48 05/23/2019   AST 12 05/23/2019   ALT 9 05/23/2019   PROT 7.6 05/23/2019   ALBUMIN 4.1 05/23/2019   CALCIUM 9.2 05/23/2019   ANIONGAP 11 02/12/2015   Lab Results  Component Value Date   CHOL 104 10/01/2017   Lab Results  Component Value Date   HDL 41 10/01/2017   Lab Results  Component Value Date   LDLCALC 41 10/01/2017   Lab Results  Component Value Date   TRIG 109 10/01/2017   Lab Results  Component Value Date   CHOLHDL 2.5 10/01/2017   Lab Results  Component Value Date   HGBA1C 6.0 (H) 05/23/2019      Assessment & Plan:   Problem List Items Addressed This Visit      Endocrine   Type 2 diabetes mellitus without complication, without long-term current use of insulin ([place]) - Primary - diabetes controlled with metformin, doing well and remains controlled     Relevant Orders   Ambulatory referral to Ophthalmology   POCT glycosylated hemoglobin (Hb A1C)    Other Visit Diagnoses     Need for prophylactic vaccination against Streptococcus pneumoniae (pneumococcus)       Relevant Orders   Pneumococcal polysaccharide vaccine 23-valent greater than or equal to 2yo subcutaneous/IM (Completed)   Essential hypertension    -  Patient's blood pressure is at goal of 139/89 or less. Condition is stable. Continue current medications and treatment plan. I recommend that you exercise for 30-45 minutes 5 days a week. I also recommend a balanced diet with fruits and vegetables every day, lean meats, and little fried foods. The DASH diet (you can find this online) is a good example of this.    Relevant Orders   Comprehensive metabolic panel   High priority for 2019-nCoV vaccine    - discussed the vaccine with patient and provided advice, pt will get vaccine, put pt on waiting list for [place]      No orders of the defined types were placed in this encounter.   Follow-up: No follow-ups on file.    Forrest Moron, MD

## 2019-10-31 NOTE — Patient Instructions (Signed)
° ° ° °  If you have lab work done today you will be contacted with your lab results within the next 2 weeks.  If you have not heard from us then please contact us. The fastest way to get your results is to register for My Chart. ° ° °IF you received an x-ray today, you will receive an invoice from [facility]. Please contact [facility] at [phone] with questions or concerns regarding your invoice.  ° °IF you received labwork today, you will receive an invoice from [facility]. Please contact [facility] at [phone] with questions or concerns regarding your invoice.  ° °Our billing staff will not be able to assist you with questions regarding bills from these companies. ° °You will be contacted with the lab results as soon as they are available. The fastest way to get your results is to activate your My Chart account. Instructions are located on the last page of this paperwork. If you have not heard from us regarding the results in 2 weeks, please contact this office. °  ° ° ° °

## 2019-11-01 LAB — COMPREHENSIVE METABOLIC PANEL
ALT: 12 IU/L (ref 0–44)
AST: 17 IU/L (ref 0–40)
Albumin/Globulin Ratio: 1.2 (ref 1.2–2.2)
Albumin: 4.2 g/dL (ref 3.6–4.6)
Alkaline Phosphatase: 52 IU/L (ref 39–117)
BUN/Creatinine Ratio: 13 (ref 10–24)
BUN: 19 mg/dL (ref 8–27)
Bilirubin Total: 0.4 mg/dL (ref 0.0–1.2)
CO2: 25 mmol/L (ref 20–29)
Calcium: 9.6 mg/dL (ref 8.6–10.2)
Chloride: 106 mmol/L (ref 96–106)
Creatinine, Ser: 1.5 mg/dL — ABNORMAL HIGH (ref 0.76–1.27)
GFR calc Af Amer: 49 mL/min/{1.73_m2} — ABNORMAL LOW (ref >59)
GFR calc non Af Amer: 42 mL/min/{1.73_m2} — ABNORMAL LOW (ref >59)
Globulin, Total: 3.5 g/dL (ref 1.5–4.5)
Glucose: 93 mg/dL (ref 65–99)
Potassium: 4.5 mmol/L (ref 3.5–5.2)
Sodium: 143 mmol/L (ref 134–144)
Total Protein: 7.7 g/dL (ref 6.0–8.5)

## 2019-11-03 ENCOUNTER — Ambulatory Visit: Payer: PPO | Admitting: Family Medicine

## 2019-11-21 ENCOUNTER — Other Ambulatory Visit: Payer: Self-pay

## 2019-11-21 ENCOUNTER — Ambulatory Visit (INDEPENDENT_AMBULATORY_CARE_PROVIDER_SITE_OTHER): Payer: PPO | Admitting: Family Medicine

## 2019-11-21 DIAGNOSIS — E119 Type 2 diabetes mellitus without complications: Secondary | ICD-10-CM

## 2019-11-21 NOTE — Progress Notes (Signed)
Patient was just seen on 10/31/19. This is an erroneous encounter. Today's visit made him flustered so it was cancelled.

## 2019-11-21 NOTE — Patient Instructions (Signed)
° ° ° °  If you have lab work done today you will be contacted with your lab results within the next 2 weeks.  If you have not heard from us then please contact us. The fastest way to get your results is to register for My Chart. ° ° °IF you received an x-ray today, you will receive an invoice from [facility]. Please contact [facility] at [phone] with questions or concerns regarding your invoice.  ° °IF you received labwork today, you will receive an invoice from [facility]. Please contact [facility] at [phone] with questions or concerns regarding your invoice.  ° °Our billing staff will not be able to assist you with questions regarding bills from these companies. ° °You will be contacted with the lab results as soon as they are available. The fastest way to get your results is to activate your My Chart account. Instructions are located on the last page of this paperwork. If you have not heard from us regarding the results in 2 weeks, please contact this office. °  ° ° ° °

## 2019-11-24 ENCOUNTER — Ambulatory Visit: Payer: PPO | Attending: Internal Medicine

## 2019-11-24 DIAGNOSIS — Z23 Encounter for immunization: Secondary | ICD-10-CM | POA: Insufficient documentation

## 2019-11-24 NOTE — Progress Notes (Signed)
   Covid-19 Vaccination Clinic  Name:  Sergio [name]    MRN: [id number] DOB: 11/02/1932  11/24/2019  [name] was observed post Covid-19 immunization for 15 minutes without incidence. He was provided with Vaccine Information Sheet and instruction to access the V-Safe system.   [name] was instructed to call 911 with any severe reactions post vaccine: Marland Kitchen Difficulty breathing  . Swelling of your face and throat  . A fast heartbeat  . A bad rash all over your body  . Dizziness and weakness    Immunizations Administered    Name Date Dose VIS Date Route   Pfizer COVID-19 Vaccine 11/24/2019  9:02 AM 0.3 mL 09/08/2019 Intramuscular   Manufacturer: [place]   Lot: [id number]   [place]: [id number]

## 2019-12-19 ENCOUNTER — Ambulatory Visit: Payer: PPO | Attending: Internal Medicine

## 2019-12-20 ENCOUNTER — Ambulatory Visit: Payer: PPO | Attending: Internal Medicine

## 2019-12-20 DIAGNOSIS — Z23 Encounter for immunization: Secondary | ICD-10-CM

## 2019-12-20 NOTE — Progress Notes (Signed)
   Covid-19 Vaccination Clinic  Name:  Sergio [name]    MRN: [id number] DOB: April 06, 1933  12/20/2019  [name] was observed post Covid-19 immunization for 15 minutes without incident. He was provided with Vaccine Information Sheet and instruction to access the V-Safe system.   [name] was instructed to call 911 with any severe reactions post vaccine: Marland Kitchen Difficulty breathing  . Swelling of face and throat  . A fast heartbeat  . A bad rash all over body  . Dizziness and weakness   Immunizations Administered    Name Date Dose VIS Date Route   Pfizer COVID-19 Vaccine 12/20/2019 10:30 AM 0.3 mL 09/08/2019 Intramuscular   Manufacturer: [place]   Lot: [id number]   [place]: [id number]

## 2020-01-12 ENCOUNTER — Ambulatory Visit: Payer: PPO | Admitting: Podiatry

## 2020-01-21 ENCOUNTER — Emergency Department (HOSPITAL_COMMUNITY)
Admission: EM | Admit: 2020-01-21 | Discharge: 2020-01-21 | Disposition: A | Discharge status: Home / Self Care | Payer: PPO | Attending: Emergency Medicine | Admitting: Emergency Medicine

## 2020-01-21 ENCOUNTER — Emergency Department (HOSPITAL_COMMUNITY): Payer: PPO

## 2020-01-21 ENCOUNTER — Other Ambulatory Visit: Payer: Self-pay

## 2020-01-21 DIAGNOSIS — E119 Type 2 diabetes mellitus without complications: Secondary | ICD-10-CM | POA: Diagnosis not present

## 2020-01-21 DIAGNOSIS — I10 Essential (primary) hypertension: Secondary | ICD-10-CM | POA: Diagnosis not present

## 2020-01-21 DIAGNOSIS — R197 Diarrhea, unspecified: Secondary | ICD-10-CM | POA: Insufficient documentation

## 2020-01-21 DIAGNOSIS — R531 Weakness: Secondary | ICD-10-CM | POA: Insufficient documentation

## 2020-01-21 DIAGNOSIS — Z79899 Other long term (current) drug therapy: Secondary | ICD-10-CM | POA: Insufficient documentation

## 2020-01-21 DIAGNOSIS — R112 Nausea with vomiting, unspecified: Secondary | ICD-10-CM | POA: Insufficient documentation

## 2020-01-21 DIAGNOSIS — Z7984 Long term (current) use of oral hypoglycemic drugs: Secondary | ICD-10-CM | POA: Insufficient documentation

## 2020-01-21 DIAGNOSIS — Z87891 Personal history of nicotine dependence: Secondary | ICD-10-CM | POA: Insufficient documentation

## 2020-01-21 LAB — CBC WITH DIFFERENTIAL/PLATELET
Abs Immature Granulocytes: 0.03 10*3/uL (ref 0.00–0.07)
Basophils Absolute: 0 10*3/uL (ref 0.0–0.1)
Basophils Relative: 0 %
Eosinophils Absolute: 0.2 10*3/uL (ref 0.0–0.5)
Eosinophils Relative: 2 %
HCT: 41.9 % (ref 39.0–52.0)
Hemoglobin: 13.7 g/dL (ref 13.0–17.0)
Immature Granulocytes: 1 %
Lymphocytes Relative: 17 %
Lymphs Abs: 1 10*3/uL (ref 0.7–4.0)
MCH: 28.1 pg (ref 26.0–34.0)
MCHC: 32.7 g/dL (ref 30.0–36.0)
MCV: 86 fL (ref 80.0–100.0)
Monocytes Absolute: 0.4 10*3/uL (ref 0.1–1.0)
Monocytes Relative: 7 %
Neutro Abs: 4.5 10*3/uL (ref 1.7–7.7)
Neutrophils Relative %: 73 %
Platelets: 182 10*3/uL (ref 150–400)
RBC: 4.87 MIL/uL (ref 4.22–5.81)
RDW: 14.2 % (ref 11.5–15.5)
WBC: 6.1 10*3/uL (ref 4.0–10.5)
nRBC: 0 % (ref 0.0–0.2)

## 2020-01-21 LAB — PROTIME-INR
INR: 1.2 (ref 0.8–1.2)
Prothrombin Time: 15 seconds (ref 11.4–15.2)

## 2020-01-21 LAB — I-STAT CHEM 8, ED
BUN: 31 mg/dL — ABNORMAL HIGH (ref 8–23)
Calcium, Ion: 1.03 mmol/L — ABNORMAL LOW (ref 1.15–1.40)
Chloride: 102 mmol/L (ref 98–111)
Creatinine, Ser: 1.8 mg/dL — ABNORMAL HIGH (ref 0.61–1.24)
Glucose, Bld: 128 mg/dL — ABNORMAL HIGH (ref 70–99)
HCT: 44 % (ref 39.0–52.0)
Hemoglobin: 15 g/dL (ref 13.0–17.0)
Potassium: 3.9 mmol/L (ref 3.5–5.1)
Sodium: 141 mmol/L (ref 135–145)
TCO2: 27 mmol/L (ref 22–32)

## 2020-01-21 LAB — COMPREHENSIVE METABOLIC PANEL
ALT: 16 U/L (ref 0–44)
AST: 23 U/L (ref 15–41)
Albumin: 3.3 g/dL — ABNORMAL LOW (ref 3.5–5.0)
Alkaline Phosphatase: 42 U/L (ref 38–126)
Anion gap: 9 (ref 5–15)
BUN: 27 mg/dL — ABNORMAL HIGH (ref 8–23)
CO2: 27 mmol/L (ref 22–32)
Calcium: 8.2 mg/dL — ABNORMAL LOW (ref 8.9–10.3)
Chloride: 102 mmol/L (ref 98–111)
Creatinine, Ser: 1.9 mg/dL — ABNORMAL HIGH (ref 0.61–1.24)
GFR calc Af Amer: 36 mL/min — ABNORMAL LOW (ref >60)
GFR calc non Af Amer: 31 mL/min — ABNORMAL LOW (ref >60)
Glucose, Bld: 134 mg/dL — ABNORMAL HIGH (ref 70–99)
Potassium: 3.9 mmol/L (ref 3.5–5.1)
Sodium: 138 mmol/L (ref 135–145)
Total Bilirubin: 0.9 mg/dL (ref 0.3–1.2)
Total Protein: 7.9 g/dL (ref 6.5–8.1)

## 2020-01-21 LAB — MAGNESIUM: Magnesium: 2 mg/dL (ref 1.7–2.4)

## 2020-01-21 LAB — TROPONIN I (HIGH SENSITIVITY)
Troponin I (High Sensitivity): 42 ng/L — ABNORMAL HIGH (ref <18)
Troponin I (High Sensitivity): 40 ng/L — ABNORMAL HIGH (ref <18)

## 2020-01-21 MED ORDER — ONDANSETRON HCL 4 MG PO TABS
4.0000 mg | ORAL_TABLET | Freq: Three times a day (TID) | ORAL | 0 refills | Status: DC | PRN
Start: 2020-01-21 — End: 2022-04-10

## 2020-01-21 MED ORDER — ASPIRIN 81 MG PO CHEW
324.0000 mg | CHEWABLE_TABLET | Freq: Once | ORAL | Status: AC
  Administered 2020-01-21: 324 mg via ORAL
  Filled 2020-01-21: qty 4

## 2020-01-21 MED ORDER — SODIUM CHLORIDE 0.9 % IV BOLUS
500.0000 mL | Freq: Once | INTRAVENOUS | Status: AC
  Administered 2020-01-21: 500 mL via INTRAVENOUS

## 2020-01-21 NOTE — ED Provider Notes (Signed)
[facility] EMERGENCY DEPARTMENT Provider Note   CSN: [id number] Arrival date & time: 01/21/20  1120     History Chief Complaint  Patient presents with  . Weakness    Sergio [name] is a 86 y.o. male.  He is brought in by his son is helping with a history.  History of diabetes hypertension PE.  Complaining of nausea and 1 episode of vomiting that started yesterday.  One episode of diarrhea.  No blood from above or below.  Feeling generally weak.  No chest pain or abdominal pain.  Feeling generally weak.  Son says he looks kind of dehydrated.  No fevers or chills.  No urinary symptoms.  No recent falls.  The history is provided by the patient and a relative.  Emesis Quality:  Stomach contents Progression:  Partially resolved Chronicity:  New Recent urination:  Normal Relieved by:  None tried Worsened by:  Nothing Ineffective treatments:  None tried Associated symptoms: diarrhea   Associated symptoms: no abdominal pain, no chills, no cough, no fever, no headaches and no sore throat   Weakness Severity:  Moderate Onset quality:  Gradual Duration:  2 days Timing:  Intermittent Progression:  Unchanged Chronicity:  New Relieved by:  None tried Worsened by:  Activity Ineffective treatments:  None tried Associated symptoms: diarrhea and vomiting   Associated symptoms: no abdominal pain, no chest pain, no cough, no dysuria, no falls, no fever, no headaches, no melena, no shortness of breath and no stroke symptoms   Risk factors: diabetes        Past Medical History:  Diagnosis Date  . Colon polyps 06/22/2014   4 colon polyps by colonoscopy.  Repeat 3 years.  Hung.  . Diabetes mellitus   . Hypertension   . Other and unspecified hyperlipidemia   . PE (pulmonary embolism)     Patient Active Problem List   Diagnosis Date Noted  . Pain due to onychomycosis of toenails of both feet 10/13/2019  . Urinary frequency 10/01/2017  . Type 2 diabetes mellitus  without complication, without long-term current use of insulin ([place]) 10/01/2017  . Loss of weight 03/24/2017  . Hypoglycemia 03/24/2017  . Iatrogenic hypotension 03/24/2017  . Nonspecific abnormal electrocardiogram (ECG) (EKG) 02/02/2017  . Colon polyps 07/04/2014  . Controlled diabetes mellitus without long-term current use of insulin ([place]) 12/18/2011  . Uncontrolled hypertension   . Other and unspecified hyperlipidemia     Past Surgical History:  Procedure Laterality Date  . COLONOSCOPY W/ POLYPECTOMY  06/22/2014   4 colon polyps; repeat 3 years; [place].       Family History  Problem Relation Age of Onset  . Diabetes Other     Social History   Tobacco Use  . Smoking status: Former Smoker    Years: 17.00    Types: Cigars    Quit date: 09/29/1975    Years since quitting: 44.3  . Smokeless tobacco: Never Used  Substance Use Topics  . Alcohol use: No  . Drug use: No    Home Medications Prior to Admission medications   Medication Sig Start Date End Date Taking? Authorizing Provider  hydrochlorothiazide (HYDRODIURIL) 25 MG tablet Take 1 tablet (25 mg total) by mouth daily. 10/31/19   Forrest Moron, MD  lisinopril (ZESTRIL) 40 MG tablet Take 1 tablet (40 mg total) by mouth daily. 10/31/19   Forrest Moron, MD  metFORMIN (GLUCOPHAGE) 500 MG tablet Take 1 tablet (500 mg total) by mouth daily with breakfast.  10/31/19   Forrest Moron, MD  OVER THE COUNTER MEDICATION Blood pressure factors  One pill per day    [provider]  OVER THE COUNTER MEDICATION Glucose support One pill per day    [provider]    Allergies    Patient has no known allergies.  Review of Systems   Review of Systems  Constitutional: Negative for chills and fever.  HENT: Negative for sore throat.   Eyes: Negative for visual disturbance.  Respiratory: Negative for cough and shortness of breath.   Cardiovascular: Negative for chest pain.  Gastrointestinal: Positive for diarrhea  and vomiting. Negative for abdominal pain and melena.  Genitourinary: Negative for dysuria.  Musculoskeletal: Negative for falls and neck pain.  Skin: Negative for rash.  Neurological: Positive for weakness. Negative for headaches.    Physical Exam Updated Vital Signs BP 132/77   Pulse 74   Temp 98.1 F (36.7 C)   Resp 19   Ht 5\' 4"  (1.626 m)   Wt 70.3 kg   SpO2 100%   BMI 26.61 kg/m   Physical Exam Vitals and nursing note reviewed.  Constitutional:      Appearance: He is well-developed.  HENT:     Head: Normocephalic and atraumatic.  Eyes:     Conjunctiva/sclera: Conjunctivae normal.  Cardiovascular:     Rate and Rhythm: Normal rate and regular rhythm.     Heart sounds: No murmur.  Pulmonary:     Effort: Pulmonary effort is normal. No respiratory distress.     Breath sounds: Normal breath sounds.  Abdominal:     Palpations: Abdomen is soft.     Tenderness: There is no abdominal tenderness.  Musculoskeletal:        General: No deformity or signs of injury. Normal range of motion.     Cervical back: Neck supple.     Right lower leg: No edema.     Left lower leg: No edema.  Skin:    General: Skin is warm and dry.     Capillary Refill: Capillary refill takes less than 2 seconds.  Neurological:     General: No focal deficit present.     Mental Status: He is alert. Mental status is at baseline.     Sensory: No sensory deficit.     Motor: No weakness.     ED Results / Procedures / Treatments   Labs (all labs ordered are listed, but only abnormal results are displayed) Labs Reviewed  COMPREHENSIVE METABOLIC PANEL - Abnormal; Notable for the following components:      Result Value   Glucose, Bld 134 (*)    BUN 27 (*)    Creatinine, Ser 1.90 (*)    Calcium 8.2 (*)    Albumin 3.3 (*)    GFR calc non Af Amer 31 (*)    GFR calc Af Amer 36 (*)    All other components within normal limits  I-STAT CHEM 8, ED - Abnormal; Notable for the following components:   BUN  31 (*)    Creatinine, Ser 1.80 (*)    Glucose, Bld 128 (*)    Calcium, Ion 1.03 (*)    All other components within normal limits  TROPONIN I (HIGH SENSITIVITY) - Abnormal; Notable for the following components:   Troponin I (High Sensitivity) 42 (*)    All other components within normal limits  TROPONIN I (HIGH SENSITIVITY) - Abnormal; Notable for the following components:   Troponin I (High Sensitivity) 40 (*)  All other components within normal limits  CBC WITH DIFFERENTIAL/PLATELET  PROTIME-INR  MAGNESIUM    EKG EKG Interpretation  Date/Time:  Sunday January 21 2020 11:26:19 EDT Ventricular Rate:  82 PR Interval:  130 QRS Duration: 80 QT Interval:  372 QTC Calculation: 434 R Axis:   -10 Text Interpretation: Sinus rhythm with Premature supraventricular complexes Minimal voltage criteria for LVH, may be normal variant ( R in aVL ) Posterior infarct , age undetermined Inferior injury pattern Consider right ventricular involvement in acute inferior infarct Abnormal ECG increased ischemic changes compared with prior 2/14 Confirmed by Aletta Edouard [id number]) on 01/21/2020 11:37:28 AM   Radiology DG Chest Port 1 View  Result Date: 01/21/2020 CLINICAL DATA:  Weakness. EXAM: PORTABLE CHEST 1 VIEW COMPARISON:  08/22/2011 FINDINGS: Mild cardiomegaly as before. Aortic atherosclerosis. Hilar structures are unremarkable. Lungs are clear. Visualized skeletal structures are unremarkable. IMPRESSION: No acute cardiopulmonary disease. Electronically Signed   By: Zetta Bills M.D.   On: 01/21/2020 12:26    Procedures Procedures (including critical care time)  Medications Ordered in ED Medications  aspirin chewable tablet 324 mg (324 mg Oral Given 01/21/20 1201)  sodium chloride 0.9 % bolus 500 mL (0 mLs Intravenous Stopped 01/21/20 1357)    ED Course  I have reviewed the triage vital signs and the nursing notes.  Pertinent labs & imaging results that were available during my care of the  patient were reviewed by me and considered in my medical decision making (see chart for details).  Clinical Course as of Jan 20 2158  Sun Jan 21, 2020  1144 Reviewed the EKG with Dr. Tamala Julian interventional cardiology on-call.  He says he does not see anything that he would take to the Cath Lab.  He feels it is probably more related to some cardiomyopathy and LVH.   [MB]  [id number] Patient's labs showing normal white count normal hemoglobin.  Creatinine elevated at 1.9, looks like his baseline is more like 1.5-1.8.  We will give some IV fluids.  Initial troponin equivocal at 42.  Will need a delta troponin.   [MB]  1454 Delta troponin stable.  Has taken some p.o.  Will ambulate here.   [MB]  1518 Patient ambulated in the department, he was somewhat unsteady.  Since this is a little worse than baseline.   [MB]  2158 Total Bilirubin: 0.9 [MB]    Clinical Course User Index [MB] Hayden Rasmussen, MD   MDM Rules/Calculators/A&P                     This patient complains of generalized weakness nausea one episode of vomiting and diarrhea; this involves an extensive number of treatment Options and is a complaint that carries with it a high risk of complications and Morbidity. The differential includes gastroenteritis, anemia, metabolic derangement, obstruction, ACS.  Patient is a very concerning EKG although similar to prior 2014.  Reviewed this with Dr. Tamala Julian intervention who does not feel this represents a STEMI.  I ordered, reviewed and interpreted labs, which included normal white count and stable hemoglobin.  Chemistries showing slightly elevated creatinine of 1.9.  LFTs fairly unremarkable. I ordered medication IV fluids and his general sense of weakness I ordered imaging studies which included chest x-ray and I independently    visualized and interpreted imaging which showed no acute changes Additional history obtained from patient's son who presented to the emergency department Previous records  obtained and reviewed in epic  After the interventions stated  above, I reevaluated the patient and found patient symptoms to be improved.  He was a little unsteady in the department and patient son said he would keep an eye on him and have him follow-up with his primary care doctor.  They will also work on trying to stay more well-hydrated.  We discussed return instructions.   Final Clinical Impression(s) / ED Diagnoses Final diagnoses:  Nausea vomiting and diarrhea  Generalized weakness    Rx / DC Orders ED Discharge Orders         Ordered    ondansetron (ZOFRAN) 4 MG tablet  Every 8 hours PRN     01/21/20 1528           Hayden Rasmussen, MD 01/21/20 2200

## 2020-01-21 NOTE — ED Triage Notes (Signed)
Reports vomiting since yesterday.  Denies pain.  EKG reading "STEMI"- Shown to Dr and pt straight to treatment room.

## 2020-01-21 NOTE — ED Notes (Signed)
Pt ambulated in hallway. Pt slow, but steady- requiring minimal assist. EDP aware.

## 2020-01-21 NOTE — ED Notes (Signed)
Pt d/c home per MD order. Discharge summary reviewed with pt, pt verbalizes understanding. No s/s of acute distress noted at discharge. Off unit via WC. Discharged home with son.

## 2020-01-21 NOTE — Discharge Instructions (Signed)
You were seen in the emergency department for nausea and vomiting with some diarrhea that started yesterday.  Also for feeling generally weak.  You had an EKG chest x-ray and blood work that showed you to be a little bit dehydrated.  Please continue to drink fluids and your regular medication.  Follow-up with your doctor.  Return to the emergency department if any worsening or concerning symptoms.

## 2020-03-08 ENCOUNTER — Ambulatory Visit: Payer: PPO | Admitting: Podiatry

## 2020-04-23 ENCOUNTER — Ambulatory Visit: Payer: PPO | Admitting: Family Medicine

## 2020-05-17 ENCOUNTER — Ambulatory Visit: Payer: PPO | Admitting: Podiatry

## 2020-05-28 ENCOUNTER — Emergency Department (HOSPITAL_COMMUNITY): Payer: PPO

## 2020-05-28 ENCOUNTER — Emergency Department (HOSPITAL_COMMUNITY)
Admission: EM | Admit: 2020-05-28 | Discharge: 2020-05-29 | Disposition: A | Discharge status: Home / Self Care | Payer: PPO | Attending: Emergency Medicine | Admitting: Emergency Medicine

## 2020-05-28 ENCOUNTER — Encounter (HOSPITAL_COMMUNITY): Payer: Self-pay | Admitting: Emergency Medicine

## 2020-05-28 DIAGNOSIS — Z79899 Other long term (current) drug therapy: Secondary | ICD-10-CM | POA: Diagnosis not present

## 2020-05-28 DIAGNOSIS — Z7984 Long term (current) use of oral hypoglycemic drugs: Secondary | ICD-10-CM | POA: Diagnosis not present

## 2020-05-28 DIAGNOSIS — M7989 Other specified soft tissue disorders: Secondary | ICD-10-CM | POA: Diagnosis not present

## 2020-05-28 DIAGNOSIS — E119 Type 2 diabetes mellitus without complications: Secondary | ICD-10-CM | POA: Insufficient documentation

## 2020-05-28 DIAGNOSIS — M7732 Calcaneal spur, left foot: Secondary | ICD-10-CM | POA: Diagnosis not present

## 2020-05-28 DIAGNOSIS — Z87891 Personal history of nicotine dependence: Secondary | ICD-10-CM | POA: Insufficient documentation

## 2020-05-28 DIAGNOSIS — M79675 Pain in left toe(s): Secondary | ICD-10-CM | POA: Diagnosis not present

## 2020-05-28 DIAGNOSIS — I10 Essential (primary) hypertension: Secondary | ICD-10-CM | POA: Insufficient documentation

## 2020-05-28 MED ORDER — TRAMADOL HCL 50 MG PO TABS: 50.0000 mg | ORAL_TABLET | Freq: Four times a day (QID) | ORAL | 0 refills | Status: DC | PRN

## 2020-05-28 NOTE — ED Provider Notes (Signed)
[facility] EMERGENCY DEPARTMENT Provider Note   CSN: [id number] Arrival date & time: 05/28/20  1442     History Chief Complaint  Patient presents with  . Foot Pain    Sergio [name] is a 86 y.o. male.  The history is provided by the patient and medical records.  Foot Pain    86 year old male with history of hypertension, diabetes, presenting to the ED with left toe pain.  States he woke up 1 morning and all the toes on his left foot were hurting him.  He denies any known injury, trauma, or falls.  He has not bought any new shoes recently.  States he checks his feet regularly and have not noticed any blisters or other wounds.  No fever or chills.  Pain is worse with weightbearing and ambulation.  He has been taking some Tylenol with minimal relief.  Denies any history of gout.  No recent EtOH, red meat, fish, etc.  Past Medical History:  Diagnosis Date  . Colon polyps 06/22/2014   4 colon polyps by colonoscopy.  Repeat 3 years.  Hung.  . Diabetes mellitus   . Hypertension   . Other and unspecified hyperlipidemia   . PE (pulmonary embolism)     Patient Active Problem List   Diagnosis Date Noted  . Pain due to onychomycosis of toenails of both feet 10/13/2019  . Urinary frequency 10/01/2017  . Type 2 diabetes mellitus without complication, without long-term current use of insulin ([place]) 10/01/2017  . Loss of weight 03/24/2017  . Hypoglycemia 03/24/2017  . Iatrogenic hypotension 03/24/2017  . Nonspecific abnormal electrocardiogram (ECG) (EKG) 02/02/2017  . Colon polyps 07/04/2014  . Controlled diabetes mellitus without long-term current use of insulin ([place]) 12/18/2011  . Uncontrolled hypertension   . Other and unspecified hyperlipidemia     Past Surgical History:  Procedure Laterality Date  . COLONOSCOPY W/ POLYPECTOMY  06/22/2014   4 colon polyps; repeat 3 years; [place].       Family History  Problem Relation Age of Onset  . Diabetes Other      Social History   Tobacco Use  . Smoking status: Former Smoker    Years: 17.00    Types: Cigars    Quit date: 09/29/1975    Years since quitting: 44.6  . Smokeless tobacco: Never Used  Substance Use Topics  . Alcohol use: No  . Drug use: No    Home Medications Prior to Admission medications   Medication Sig Start Date End Date Taking? Authorizing Provider  hydrochlorothiazide (HYDRODIURIL) 25 MG tablet Take 1 tablet (25 mg total) by mouth daily. 10/31/19   Forrest Moron, MD  lisinopril (ZESTRIL) 40 MG tablet Take 1 tablet (40 mg total) by mouth daily. 10/31/19   Forrest Moron, MD  metFORMIN (GLUCOPHAGE) 500 MG tablet Take 1 tablet (500 mg total) by mouth daily with breakfast. 10/31/19   Forrest Moron, MD  ondansetron (ZOFRAN) 4 MG tablet Take 1 tablet (4 mg total) by mouth every 8 (eight) hours as needed for nausea or vomiting. 01/21/20   Hayden Rasmussen, MD  OVER THE COUNTER MEDICATION Blood pressure factors  One pill per day    [provider]  OVER THE COUNTER MEDICATION Glucose support One pill per day    [provider]    Allergies    Patient has no known allergies.  Review of Systems   Review of Systems  Musculoskeletal: Positive for arthralgias.  All other systems  reviewed and are negative.   Physical Exam Updated Vital Signs BP 135/69 (BP Location: Left Arm)   Pulse 80   Temp 98.2 F (36.8 C) (Oral)   Resp 16   Ht 5\' 4"  (1.626 m)   Wt 61.2 kg   SpO2 99%   BMI 23.17 kg/m   Physical Exam Vitals and nursing note reviewed.  Constitutional:      Appearance: He is well-developed.  HENT:     Head: Normocephalic and atraumatic.  Eyes:     Conjunctiva/sclera: Conjunctivae normal.     Pupils: Pupils are equal, round, and reactive to light.  Cardiovascular:     Rate and Rhythm: Normal rate and regular rhythm.     Heart sounds: Normal heart sounds.  Pulmonary:     Effort: Pulmonary effort is normal.     Breath sounds: Normal breath  sounds.  Abdominal:     General: Bowel sounds are normal.     Palpations: Abdomen is soft.  Musculoskeletal:        General: Normal range of motion.     Cervical back: Normal range of motion.     Comments: Left foot is grossly normal in appearance, there is a chronic appearing callus along the IP joint of the second toe, there is no open wound or ulceration of the foot, he is freely moving toes without difficulty, DP pulse intact, good sensation and distal perfusion  Skin:    General: Skin is warm and dry.  Neurological:     Mental Status: He is alert and oriented to person, place, and time.     ED Results / Procedures / Treatments   Labs (all labs ordered are listed, but only abnormal results are displayed) Labs Reviewed - No data to display  EKG None  Radiology DG Foot Complete Left  Result Date: 05/28/2020 CLINICAL DATA:  Left foot pain EXAM: LEFT FOOT - COMPLETE 3+ VIEW COMPARISON:  01/14/2012 FINDINGS: Three view radiograph of the a left foot demonstrates flexion of the in second and third digits, particularly the second digit, possibly reflecting underlying hammertoe deformities. No acute fracture or dislocation. Joint spaces are preserved. Small plantar calcaneal spur. There is moderate soft tissue swelling of the forefoot. Vascular calcifications are seen IMPRESSION: 1. No acute fracture or dislocation. 2. Possible hammertoe deformities of the second and third digits. Electronically Signed   By: Fidela Salisbury MD   On: 05/28/2020 22:49    Procedures Procedures (including critical care time)  Medications Ordered in ED Medications - No data to display  ED Course  I have reviewed the triage vital signs and the nursing notes.  Pertinent labs & imaging results that were available during my care of the patient were reviewed by me and considered in my medical decision making (see chart for details).    MDM Rules/Calculators/A&P  86 year old male presenting to the ED with  toe pain of left foot.  States he just woke up and in pain.  Denies any injury, trauma, or falls.  Pain is worse with weightbearing and ambulation.  Does have callus of second toe which appears chronic.  There are no other open wounds or sores.  Foot is neurovascularly intact.  X-rays negative.  Do have suspicion may be component of gout.  Denies any recent intake of alcohol, red meat, or fish, however he is on HCTZ which can contribute.  I discussed this with him and his son, they will need to arrange follow-up with PCP later this week  for recheck.  His last creatinine was 1.8, not a candidate for anti-inflammatories and he is also diabetic so we will hold off on prednisone.  He was given prescription for pain medication, advised to use judiciously and not to drive as it can precipitate drowsiness.  Return here for any new/acute changes.  Final Clinical Impression(s) / ED Diagnoses Final diagnoses:  Toe pain, left    Rx / DC Orders ED Discharge Orders         Ordered    traMADol (ULTRAM) 50 MG tablet  Every 6 hours PRN        05/28/20 2303           Larene Pickett, PA-C 05/28/20 2341    Hayden Rasmussen, MD 05/29/20 1043

## 2020-05-28 NOTE — Discharge Instructions (Signed)
Take the prescribed medication as directed.  Be careful, this can make you drowsy. I would try to avoid red meat, fish, alcohol, for the time being.  One of your medications (HCTZ) can precipitate gout.  See attached information about gout.   Follow-up with your primary care doctor this week for re-check. Return to the ED for new or worsening symptoms.

## 2020-05-28 NOTE — ED Notes (Signed)
Called pt x5 for vitals, no response.

## 2020-05-28 NOTE — ED Triage Notes (Signed)
Patient reports L toe pain X few days. Denies injury. No deformities present. No wounds present.

## 2020-06-30 ENCOUNTER — Inpatient Hospital Stay (HOSPITAL_COMMUNITY): Payer: PPO

## 2020-06-30 ENCOUNTER — Encounter (HOSPITAL_COMMUNITY): Payer: Self-pay

## 2020-06-30 ENCOUNTER — Inpatient Hospital Stay (HOSPITAL_COMMUNITY)
Admission: EM | Admit: 2020-06-30 | Discharge: 2020-07-07 | DRG: 682 | Disposition: A | Discharge status: Home / Self Care | Payer: PPO | Attending: Internal Medicine | Admitting: Internal Medicine

## 2020-06-30 ENCOUNTER — Other Ambulatory Visit: Payer: Self-pay

## 2020-06-30 ENCOUNTER — Emergency Department (HOSPITAL_COMMUNITY): Payer: PPO

## 2020-06-30 DIAGNOSIS — G9341 Metabolic encephalopathy: Secondary | ICD-10-CM | POA: Diagnosis not present

## 2020-06-30 DIAGNOSIS — G3184 Mild cognitive impairment, so stated: Secondary | ICD-10-CM | POA: Diagnosis present

## 2020-06-30 DIAGNOSIS — N1330 Unspecified hydronephrosis: Secondary | ICD-10-CM | POA: Diagnosis present

## 2020-06-30 DIAGNOSIS — Z86711 Personal history of pulmonary embolism: Secondary | ICD-10-CM | POA: Diagnosis not present

## 2020-06-30 DIAGNOSIS — E1122 Type 2 diabetes mellitus with diabetic chronic kidney disease: Secondary | ICD-10-CM | POA: Diagnosis not present

## 2020-06-30 DIAGNOSIS — Z20822 Contact with and (suspected) exposure to covid-19: Secondary | ICD-10-CM | POA: Diagnosis present

## 2020-06-30 DIAGNOSIS — M109 Gout, unspecified: Secondary | ICD-10-CM | POA: Diagnosis not present

## 2020-06-30 DIAGNOSIS — E876 Hypokalemia: Secondary | ICD-10-CM | POA: Diagnosis not present

## 2020-06-30 DIAGNOSIS — N1832 Chronic kidney disease, stage 3b: Secondary | ICD-10-CM | POA: Diagnosis not present

## 2020-06-30 DIAGNOSIS — B351 Tinea unguium: Secondary | ICD-10-CM | POA: Diagnosis not present

## 2020-06-30 DIAGNOSIS — Z7984 Long term (current) use of oral hypoglycemic drugs: Secondary | ICD-10-CM

## 2020-06-30 DIAGNOSIS — R52 Pain, unspecified: Secondary | ICD-10-CM | POA: Diagnosis not present

## 2020-06-30 DIAGNOSIS — E119 Type 2 diabetes mellitus without complications: Secondary | ICD-10-CM

## 2020-06-30 DIAGNOSIS — K5900 Constipation, unspecified: Secondary | ICD-10-CM

## 2020-06-30 DIAGNOSIS — Z8719 Personal history of other diseases of the digestive system: Secondary | ICD-10-CM

## 2020-06-30 DIAGNOSIS — N138 Other obstructive and reflux uropathy: Secondary | ICD-10-CM | POA: Diagnosis not present

## 2020-06-30 DIAGNOSIS — E669 Obesity, unspecified: Secondary | ICD-10-CM | POA: Diagnosis present

## 2020-06-30 DIAGNOSIS — R338 Other retention of urine: Secondary | ICD-10-CM | POA: Diagnosis not present

## 2020-06-30 DIAGNOSIS — M7989 Other specified soft tissue disorders: Secondary | ICD-10-CM | POA: Diagnosis not present

## 2020-06-30 DIAGNOSIS — N179 Acute kidney failure, unspecified: Secondary | ICD-10-CM | POA: Diagnosis not present

## 2020-06-30 DIAGNOSIS — Z833 Family history of diabetes mellitus: Secondary | ICD-10-CM

## 2020-06-30 DIAGNOSIS — I82501 Chronic embolism and thrombosis of unspecified deep veins of right lower extremity: Secondary | ICD-10-CM | POA: Diagnosis present

## 2020-06-30 DIAGNOSIS — R5381 Other malaise: Secondary | ICD-10-CM | POA: Diagnosis present

## 2020-06-30 DIAGNOSIS — N320 Bladder-neck obstruction: Secondary | ICD-10-CM | POA: Diagnosis present

## 2020-06-30 DIAGNOSIS — D631 Anemia in chronic kidney disease: Secondary | ICD-10-CM | POA: Diagnosis not present

## 2020-06-30 DIAGNOSIS — Z79891 Long term (current) use of opiate analgesic: Secondary | ICD-10-CM

## 2020-06-30 DIAGNOSIS — R609 Edema, unspecified: Secondary | ICD-10-CM | POA: Diagnosis not present

## 2020-06-30 DIAGNOSIS — R4189 Other symptoms and signs involving cognitive functions and awareness: Secondary | ICD-10-CM | POA: Diagnosis not present

## 2020-06-30 DIAGNOSIS — E785 Hyperlipidemia, unspecified: Secondary | ICD-10-CM | POA: Diagnosis present

## 2020-06-30 DIAGNOSIS — Z87891 Personal history of nicotine dependence: Secondary | ICD-10-CM

## 2020-06-30 DIAGNOSIS — R000 Tachycardia, unspecified: Secondary | ICD-10-CM | POA: Diagnosis not present

## 2020-06-30 DIAGNOSIS — R918 Other nonspecific abnormal finding of lung field: Secondary | ICD-10-CM | POA: Diagnosis not present

## 2020-06-30 DIAGNOSIS — Z043 Encounter for examination and observation following other accident: Secondary | ICD-10-CM | POA: Diagnosis not present

## 2020-06-30 DIAGNOSIS — N1370 Vesicoureteral-reflux, unspecified: Secondary | ICD-10-CM | POA: Diagnosis not present

## 2020-06-30 DIAGNOSIS — I10 Essential (primary) hypertension: Secondary | ICD-10-CM

## 2020-06-30 DIAGNOSIS — D72825 Bandemia: Secondary | ICD-10-CM | POA: Diagnosis not present

## 2020-06-30 DIAGNOSIS — K8020 Calculus of gallbladder without cholecystitis without obstruction: Secondary | ICD-10-CM | POA: Diagnosis not present

## 2020-06-30 DIAGNOSIS — Z79899 Other long term (current) drug therapy: Secondary | ICD-10-CM

## 2020-06-30 DIAGNOSIS — R0902 Hypoxemia: Secondary | ICD-10-CM | POA: Diagnosis not present

## 2020-06-30 DIAGNOSIS — Z6827 Body mass index (BMI) 27.0-27.9, adult: Secondary | ICD-10-CM

## 2020-06-30 DIAGNOSIS — I129 Hypertensive chronic kidney disease with stage 1 through stage 4 chronic kidney disease, or unspecified chronic kidney disease: Secondary | ICD-10-CM | POA: Diagnosis not present

## 2020-06-30 DIAGNOSIS — R7989 Other specified abnormal findings of blood chemistry: Secondary | ICD-10-CM | POA: Diagnosis not present

## 2020-06-30 DIAGNOSIS — E1165 Type 2 diabetes mellitus with hyperglycemia: Secondary | ICD-10-CM | POA: Diagnosis present

## 2020-06-30 DIAGNOSIS — D72829 Elevated white blood cell count, unspecified: Secondary | ICD-10-CM | POA: Diagnosis not present

## 2020-06-30 DIAGNOSIS — N401 Enlarged prostate with lower urinary tract symptoms: Secondary | ICD-10-CM | POA: Diagnosis present

## 2020-06-30 DIAGNOSIS — R531 Weakness: Secondary | ICD-10-CM | POA: Diagnosis not present

## 2020-06-30 DIAGNOSIS — N289 Disorder of kidney and ureter, unspecified: Secondary | ICD-10-CM | POA: Diagnosis not present

## 2020-06-30 DIAGNOSIS — M79671 Pain in right foot: Secondary | ICD-10-CM | POA: Diagnosis not present

## 2020-06-30 LAB — BASIC METABOLIC PANEL
Anion gap: 21 — ABNORMAL HIGH (ref 5–15)
BUN: 106 mg/dL — ABNORMAL HIGH (ref 8–23)
CO2: 19 mmol/L — ABNORMAL LOW (ref 22–32)
Calcium: 8.6 mg/dL — ABNORMAL LOW (ref 8.9–10.3)
Chloride: 95 mmol/L — ABNORMAL LOW (ref 98–111)
Creatinine, Ser: 10.65 mg/dL — ABNORMAL HIGH (ref 0.61–1.24)
GFR calc Af Amer: 5 mL/min — ABNORMAL LOW (ref >60)
GFR calc non Af Amer: 4 mL/min — ABNORMAL LOW (ref >60)
Glucose, Bld: 126 mg/dL — ABNORMAL HIGH (ref 70–99)
Potassium: 4.6 mmol/L (ref 3.5–5.1)
Sodium: 135 mmol/L (ref 135–145)

## 2020-06-30 LAB — PROTIME-INR
INR: 1.4 — ABNORMAL HIGH (ref 0.8–1.2)
Prothrombin Time: 17.1 seconds — ABNORMAL HIGH (ref 11.4–15.2)

## 2020-06-30 LAB — CBC WITH DIFFERENTIAL/PLATELET
Abs Immature Granulocytes: 0.21 10*3/uL — ABNORMAL HIGH (ref 0.00–0.07)
Basophils Absolute: 0 10*3/uL (ref 0.0–0.1)
Basophils Relative: 0 %
Eosinophils Absolute: 0 10*3/uL (ref 0.0–0.5)
Eosinophils Relative: 0 %
HCT: 33 % — ABNORMAL LOW (ref 39.0–52.0)
Hemoglobin: 11.3 g/dL — ABNORMAL LOW (ref 13.0–17.0)
Immature Granulocytes: 1 %
Lymphocytes Relative: 6 %
Lymphs Abs: 1 10*3/uL (ref 0.7–4.0)
MCH: 28 pg (ref 26.0–34.0)
MCHC: 34.2 g/dL (ref 30.0–36.0)
MCV: 81.9 fL (ref 80.0–100.0)
Monocytes Absolute: 1 10*3/uL (ref 0.1–1.0)
Monocytes Relative: 6 %
Neutro Abs: 14.7 10*3/uL — ABNORMAL HIGH (ref 1.7–7.7)
Neutrophils Relative %: 87 %
Platelets: 250 10*3/uL (ref 150–400)
RBC: 4.03 MIL/uL — ABNORMAL LOW (ref 4.22–5.81)
RDW: 14.4 % (ref 11.5–15.5)
WBC: 17 10*3/uL — ABNORMAL HIGH (ref 4.0–10.5)
nRBC: 0 % (ref 0.0–0.2)

## 2020-06-30 LAB — RESPIRATORY PANEL BY RT PCR (FLU A&B, COVID)
Influenza A by PCR: NEGATIVE
Influenza B by PCR: NEGATIVE
SARS Coronavirus 2 by RT PCR: NEGATIVE

## 2020-06-30 LAB — MAGNESIUM: Magnesium: 3 mg/dL — ABNORMAL HIGH (ref 1.7–2.4)

## 2020-06-30 LAB — URINALYSIS, ROUTINE W REFLEX MICROSCOPIC
Bilirubin Urine: NEGATIVE
Glucose, UA: NEGATIVE mg/dL
Hgb urine dipstick: NEGATIVE
Ketones, ur: NEGATIVE mg/dL
Leukocytes,Ua: NEGATIVE
Nitrite: NEGATIVE
Protein, ur: NEGATIVE mg/dL
Specific Gravity, Urine: 1.011 (ref 1.005–1.030)
pH: 5 (ref 5.0–8.0)

## 2020-06-30 LAB — CBG MONITORING, ED
Glucose-Capillary: 119 mg/dL — ABNORMAL HIGH (ref 70–99)
Glucose-Capillary: 125 mg/dL — ABNORMAL HIGH (ref 70–99)
Glucose-Capillary: 98 mg/dL (ref 70–99)

## 2020-06-30 LAB — LACTIC ACID, PLASMA
Lactic Acid, Venous: 2 mmol/L (ref 0.5–1.9)
Lactic Acid, Venous: 1.3 mmol/L (ref 0.5–1.9)
Lactic Acid, Venous: 1.5 mmol/L (ref 0.5–1.9)

## 2020-06-30 LAB — HEPATIC FUNCTION PANEL
ALT: 20 U/L (ref 0–44)
AST: 20 U/L (ref 15–41)
Albumin: 2.5 g/dL — ABNORMAL LOW (ref 3.5–5.0)
Alkaline Phosphatase: 66 U/L (ref 38–126)
Bilirubin, Direct: 0.2 mg/dL (ref 0.0–0.2)
Indirect Bilirubin: 0.6 mg/dL (ref 0.3–0.9)
Total Bilirubin: 0.8 mg/dL (ref 0.3–1.2)
Total Protein: 7 g/dL (ref 6.5–8.1)

## 2020-06-30 LAB — CK: Total CK: 200 U/L (ref 49–397)

## 2020-06-30 LAB — URIC ACID: Uric Acid, Serum: 16.8 mg/dL — ABNORMAL HIGH (ref 3.7–8.6)

## 2020-06-30 LAB — SODIUM, URINE, RANDOM: Sodium, Ur: 12 mmol/L

## 2020-06-30 LAB — CREATININE, URINE, RANDOM: Creatinine, Urine: 143.99 mg/dL

## 2020-06-30 LAB — SEDIMENTATION RATE: Sed Rate: 105 mm/hr — ABNORMAL HIGH (ref 0–16)

## 2020-06-30 LAB — PROCALCITONIN: Procalcitonin: 0.64 ng/mL

## 2020-06-30 MED ORDER — HYDROCODONE-ACETAMINOPHEN 5-325 MG PO TABS: 1.0000 | ORAL_TABLET | ORAL | Status: DC | PRN

## 2020-06-30 MED ORDER — INSULIN ASPART 100 UNIT/ML ~~LOC~~ SOLN
0.0000 [IU] | SUBCUTANEOUS | Status: DC
  Administered 2020-06-30: 1 [IU] via SUBCUTANEOUS
  Administered 2020-07-01: 2 [IU] via SUBCUTANEOUS
  Administered 2020-07-01 – 2020-07-02 (×3): 1 [IU] via SUBCUTANEOUS
  Administered 2020-07-02: 3 [IU] via SUBCUTANEOUS
  Administered 2020-07-02: 2 [IU] via SUBCUTANEOUS
  Administered 2020-07-02: 1 [IU] via SUBCUTANEOUS
  Administered 2020-07-02 – 2020-07-03 (×2): 2 [IU] via SUBCUTANEOUS
  Administered 2020-07-03 (×2): 3 [IU] via SUBCUTANEOUS
  Administered 2020-07-04: 1 [IU] via SUBCUTANEOUS
  Administered 2020-07-04 (×2): 3 [IU] via SUBCUTANEOUS
  Administered 2020-07-04 – 2020-07-05 (×2): 1 [IU] via SUBCUTANEOUS
  Administered 2020-07-05: 3 [IU] via SUBCUTANEOUS
  Administered 2020-07-05: 1 [IU] via SUBCUTANEOUS
  Administered 2020-07-05: 5 [IU] via SUBCUTANEOUS
  Administered 2020-07-06: 7 [IU] via SUBCUTANEOUS
  Administered 2020-07-06: 5 [IU] via SUBCUTANEOUS
  Administered 2020-07-06: 2 [IU] via SUBCUTANEOUS
  Administered 2020-07-06: 1 [IU] via SUBCUTANEOUS
  Filled 2020-06-30: qty 0.09

## 2020-06-30 MED ORDER — SODIUM CHLORIDE 0.9% FLUSH
3.0000 mL | Freq: Two times a day (BID) | INTRAVENOUS | Status: DC
  Administered 2020-07-01 – 2020-07-03 (×5): 3 mL via INTRAVENOUS

## 2020-06-30 MED ORDER — ONDANSETRON HCL 4 MG/2ML IJ SOLN: 4.0000 mg | Freq: Four times a day (QID) | INTRAMUSCULAR | Status: DC | PRN

## 2020-06-30 MED ORDER — SODIUM CHLORIDE 0.9 % IV SOLN: INTRAVENOUS | Status: AC

## 2020-06-30 MED ORDER — ACETAMINOPHEN 650 MG RE SUPP: 650.0000 mg | Freq: Four times a day (QID) | RECTAL | Status: DC | PRN

## 2020-06-30 MED ORDER — ACETAMINOPHEN 325 MG PO TABS: 650.0000 mg | ORAL_TABLET | Freq: Four times a day (QID) | ORAL | Status: DC | PRN

## 2020-06-30 MED ORDER — DOCUSATE SODIUM 100 MG PO CAPS
100.0000 mg | ORAL_CAPSULE | Freq: Two times a day (BID) | ORAL | Status: DC
  Administered 2020-07-01 – 2020-07-07 (×12): 100 mg via ORAL
  Filled 2020-06-30 (×13): qty 1

## 2020-06-30 MED ORDER — COLCHICINE 0.3 MG HALF TABLET
0.3000 mg | ORAL_TABLET | Freq: Once | ORAL | Status: AC
  Administered 2020-06-30: 0.3 mg via ORAL
  Filled 2020-06-30: qty 1

## 2020-06-30 MED ORDER — ONDANSETRON HCL 4 MG PO TABS: 4.0000 mg | ORAL_TABLET | Freq: Four times a day (QID) | ORAL | Status: DC | PRN

## 2020-06-30 MED ORDER — SODIUM CHLORIDE 0.9 % IV SOLN
2.0000 g | INTRAVENOUS | Status: AC
  Administered 2020-06-30 – 2020-07-04 (×5): 2 g via INTRAVENOUS
  Filled 2020-06-30 (×3): qty 20
  Filled 2020-06-30 (×2): qty 2

## 2020-06-30 MED ORDER — PREDNISONE 20 MG PO TABS
60.0000 mg | ORAL_TABLET | Freq: Once | ORAL | Status: AC
  Administered 2020-06-30: 60 mg via ORAL
  Filled 2020-06-30: qty 3

## 2020-06-30 MED ORDER — SODIUM CHLORIDE 0.9 % IV SOLN
500.0000 mg | INTRAVENOUS | Status: AC
  Administered 2020-06-30 – 2020-07-05 (×5): 500 mg via INTRAVENOUS
  Filled 2020-06-30 (×5): qty 500

## 2020-06-30 MED ORDER — SENNOSIDES-DOCUSATE SODIUM 8.6-50 MG PO TABS: 1.0000 | ORAL_TABLET | Freq: Every evening | ORAL | Status: DC | PRN

## 2020-06-30 MED ORDER — OXYCODONE-ACETAMINOPHEN 5-325 MG PO TABS
1.0000 | ORAL_TABLET | Freq: Once | ORAL | Status: AC
  Administered 2020-06-30: 1 via ORAL
  Filled 2020-06-30: qty 1

## 2020-06-30 NOTE — Consult Note (Signed)
[facility]  HISTORY AND PHYSICAL  Sergio [name] is an 86 y.o. male.    Chief Complaint: R foot pain and generalized weakness  HPI: Pt is an 9M with a PMH sig for HTN, DM II, CKD 3b, and gout who is now seen in consultation at the request of Dr. Zenia Resides for eval and recs re: AKI on CKD. History obtained from pt's son Ron as pt is somnolent and is unable to provide history.    Briefly, Aug 31st he went to the ED for acute onset R foot pain.  All toes on R foot were hurting/ burning.  Thought to be gout- not given colchicine d/t h/o CKD and no pred d/t h/o DM.  Given tramadol and was to have a f/u with PCP.  He returns however today for increased R foot pain and generalized weakness.  Pt's son says that normally he is able to get up and walk around by himself.  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  However, the past 2 weeks he has not been able to get up without significant assistance d/t R foot pain.  He has also had the development of bilateral increased LE edema.  Today pt's son was unable to get him up out of the chair by himself and so he was brought to the hospital for evaluation.    On arrival, vitals were stable.  Labs revealed WBC 17.0, Hgb 11.3, Hct 33.0, plts 250; BUN 106, Cr 10.65, K 4.5, CO2 19, Ca 8.6, lactate 2.0.  Blood cultures drawn.    Pt's son denies HA, CP, SOB, n/v.  Has noticed that the past 2 weeks he has had increased difficulty with urination, going to the bathroom frequently but "not doing anything".  Says that on Friday he must have gone to the bathroom about 15 times.  No blood in stools/ urine, unclear if had dysuria.  Has been taking all meds as prescribed which include HCTZ and lisinopril and metformin.  No NSAIDS.  Last Cr 1.8 10/2019.  PMH: Past Medical History:  Diagnosis Date  . Colon polyps 06/22/2014   4 colon polyps by colonoscopy.  Repeat 3 years.  Hung.  . Diabetes mellitus   . Hypertension   . Other and unspecified  hyperlipidemia   . PE (pulmonary embolism)    PSH: Past Surgical History:  Procedure Laterality Date  . COLONOSCOPY W/ POLYPECTOMY  06/22/2014   4 colon polyps; repeat 3 years; [place].    Past Medical History:  Diagnosis Date  . Colon polyps 06/22/2014   4 colon polyps by colonoscopy.  Repeat 3 years.  Hung.  . Diabetes mellitus   . Hypertension   . Other and unspecified hyperlipidemia   . PE (pulmonary embolism)     Medications: HCTZ 25 mg daily Lisinopril 40 mg daily Metformin 500 mg daily Tramadol 50 mg q 6 hr prn    ALLERGIES:  No Known Allergies  FAM HX: Family History  Problem Relation Age of Onset  . Diabetes Other     Social History:   reports that he quit smoking about 44 years ago. His smoking use included cigars. He quit after 17.00 years of use. He has never used smokeless tobacco. He reports that he does not drink alcohol and does not use drugs.  ROS: ROS: all other systems reviewed with pt's son and are negative except as per HPI  Blood pressure (!) 190/79, pulse 88, temperature (!) 97.5 F (  36.4 C), temperature source Oral, resp. rate 18, SpO2 100 %. PHYSICAL EXAM: Physical Exam  GEN NAD, lying in bed, somnolent, arousable HEENT sclerae anicteric NECK 2-3 cm JVD 30 degrees PULM normal WOB, faint expiratory wheezes CV RRR loud S2, loudest at LUSB ABD soft, no suprapubic tenderness but firmness, ? Bladder edge at umbilicus EXT bilateral 2+ LE edema,  R > L, R dorsum of foot more tender and darker in color than L dorsum of foot NEURO somnolent, some rare twitches but no frank asterixis SKIN no open wounds   Results for orders placed or performed during the hospital encounter of 06/30/20 (from the past 48 hour(s))  CBC with Differential/Platelet     Status: Abnormal   Collection Time: 06/30/20  4:04 PM  Result Value Ref Range   WBC 17.0 (H) 4.0 - 10.5 K/uL   RBC 4.03 (L) 4.22 - 5.81 MIL/uL   Hemoglobin 11.3 (L) 13.0 - 17.0 g/dL   HCT 33.0 (L) 39  - 52 %   MCV 81.9 80.0 - 100.0 fL   MCH 28.0 26.0 - 34.0 pg   MCHC 34.2 30.0 - 36.0 g/dL   RDW 14.4 11.5 - 15.5 %   Platelets 250 150 - 400 K/uL   nRBC 0.0 0.0 - 0.2 %   Neutrophils Relative % 87 %   Neutro Abs 14.7 (H) 1.7 - 7.7 K/uL   Lymphocytes Relative 6 %   Lymphs Abs 1.0 0.7 - 4.0 K/uL   Monocytes Relative 6 %   Monocytes Absolute 1.0 0 - 1 K/uL   Eosinophils Relative 0 %   Eosinophils Absolute 0.0 0 - 0 K/uL   Basophils Relative 0 %   Basophils Absolute 0.0 0 - 0 K/uL   Immature Granulocytes 1 %   Abs Immature Granulocytes 0.21 (H) 0.00 - 0.07 K/uL    Comment: Performed at [facility], [place] [address], [place], [place] [address]  Basic metabolic panel     Status: Abnormal   Collection Time: 06/30/20  4:04 PM  Result Value Ref Range   Sodium 135 135 - 145 mmol/L   Potassium 4.6 3.5 - 5.1 mmol/L   Chloride 95 (L) 98 - 111 mmol/L   CO2 19 (L) 22 - 32 mmol/L   Glucose, Bld 126 (H) 70 - 99 mg/dL    Comment: Glucose reference range applies only to samples taken after fasting for at least 8 hours.   BUN 106 (H) 8 - 23 mg/dL    Comment: RESULTS CONFIRMED BY MANUAL DILUTION   Creatinine, Ser 10.65 (H) 0.61 - 1.24 mg/dL   Calcium 8.6 (L) 8.9 - 10.3 mg/dL   GFR calc non Af Amer 4 (L) >60 mL/min   GFR calc Af Amer 5 (L) >60 mL/min   Anion gap 21 (H) 5 - 15    Comment: Performed at [facility], [place] [address] [place], [place] [address]  POC CBG, ED     Status: Abnormal   Collection Time: 06/30/20  4:30 PM  Result Value Ref Range   Glucose-Capillary 119 (H) 70 - 99 mg/dL    Comment: Glucose reference range applies only to samples taken after fasting for at least 8 hours.  Lactic acid, plasma     Status: Abnormal   Collection Time: 06/30/20  4:54 PM  Result Value Ref Range   Lactic Acid, Venous 2.0 (HH) 0.5 - 1.9 mmol/L    Comment: CRITICAL RESULT CALLED TO, READ BACK BY AND VERIFIED  WITHLovell Sheehan [id number] @1821  BY  V.WILKINS Performed at [facility] [address] [place], [place] [address]   Sedimentation rate     Status: Abnormal   Collection Time: 06/30/20  4:54 PM  Result Value Ref Range   Sed Rate 105 (H) 0 - 16 mm/hr    Comment: Performed at [facility], [place] [address], [place], [place] [address]    No results found.  Assessment/Plan 1.  AKI on CKD: Cr baseline 1.8 as of 10/2019.  2 week history of progressive decline.  No h/o hypotension or NSAIDs.  Taking all meds as prescribed.  I suspect obstruction given history of frequent urination.  Will order UA, renal US, bladder scan with I/O cath if > 300 mL in bladder.  Hold HCTZ and lisinopril, avoid all nephrotoxic agents.  Will hold on serologic workup for now until renal US etc comes back but will need to pursue if pt is not obstructed.  Dialysis discussed with pt's son briefly if no quick fix/ turnaround in the next 24-48 hrs.    2.  R foot pain:  Leukocytosis with WBC 17, h/o gout.  Add on uric acid, ? If he has infection on top of it.  Got a dose of colchicine and pred. Per EDP notes, ortho to be consulted, defer antibiotics and imaging to EDP/ ortho. Cultures and CXR pending as well.    3.  HTN: Hold HCTZ and lisinopril, could sub amlodipine for now  4.  DM: per primary, hold metformin  5.  Acute encephalopathy: likely multifactorial--> infection vs uremia vs med effect from tramadol?  Imaging per primary  6.  Leukocytosis: 17K, h/o gout, cultures and CXR pending.    7.  Dispo: to be admitted  Madelon Lips 06/30/2020, 8:06 PM

## 2020-06-30 NOTE — ED Notes (Signed)
Date and time results received: 06/30/20 1820  Test: lactic acid  Critical Value: 2.0  Name of Provider Notified: Lacretia Leigh MD

## 2020-06-30 NOTE — H&P (Addendum)
Sergio [name] VCB:[id number] DOB: [birth year] DOA: 06/30/2020     PCP: Forrest Moron, MD   Outpatient Specialists:  NONE    Patient arrived to ER on 06/30/20 at 1459 Referred by Attending Lacretia Leigh, MD   Patient coming from: home Lives With family    Chief Complaint:  Chief Complaint  Patient presents with  . Gout    HPI: Sergio [name] is a 86 y.o. male with medical history significant of HTN, DM 2 hx of PE  CKD   Presented with  Pain and swelling in both ankles.PAtietn thought he had gout.  He was recently treated with medication but his pain has gotten worse No fever or chills,    PEr records he was given tramadol for gout pain in end of August Family reports decreased urine output as well as constipation Also reports lately has had decreased p.o. intake as well  Patient's family states he is a man of little words and does not complain When asked patient denies any pain but continues to moan iInfectious risk factors:  Reports none    Has been vaccinated against COVID    Initial COVID TEST  NEGATIVE   Lab Results  Component Value Date   [place] NEGATIVE 06/30/2020    Regarding pertinent Chronic problems:       HTN on HCTZ, lisinopril    DM 2 -  Lab Results  Component Value Date   HGBA1C 6.3 (A) 10/31/2019   on PO meds only,      CKD stage III - baseline Cr 1.9 CrCl cannot be calculated (Unknown ideal weight.).  Lab Results  Component Value Date   CREATININE 10.65 (H) 06/30/2020   CREATININE 1.80 (H) 01/21/2020   CREATININE 1.90 (H) 01/21/2020    While in ER: Noted to have creatinine of 10.6 and BUN 106 up from baseline of 1.8 lactic acidosis to 1 blood cell count 17 Noted bilateral lower extremity swelling markable  ER Provider Called: Orthopedics   Dr. Gershon Mussel to evaluate for septic arthritis They Recommend admit to medicine   Will see in consult   ER Provider Called: nephrology Dr. Hollie Salk [facility] at [facility] no indication for emergent  dialysis at this time They Recommend admit to medicine   Will see in consult Foley catheter ordered Hospitalist was called for admission for AKI and hydronephrosis  The following Work up has been ordered so far:  Orders Placed This Encounter  Procedures  . Culture, blood (Routine X 2) w Reflex to ID Panel  . Respiratory Panel by RT PCR (Flu A&B, Covid) - Nasopharyngeal Swab  . DG Chest Port 1 View  . CBC with Differential/Platelet  . Basic metabolic panel  . Lactic acid, plasma  . Sedimentation rate  . Lactic acid, plasma  . Procalcitonin  . Protime-INR  . In and Out Cath  . Refer to Sidebar Report: Sepsis Bundle ED/IP  . If lactate (lactic acid) >2, verify repeat lactic acid order has been placed to be drawn  . Document vital signs within 1-hour of fluid bolus completion and notify provider of bolus completion  . Vital signs  . Assess and Document Glasgow Coma Scale  . Consult to nephrology  ALL PATIENTS BEING ADMITTED/HAVING PROCEDURES NEED COVID-19 SCREENING  . Consult to orthopedic surgery  ALL PATIENTS BEING ADMITTED/HAVING PROCEDURES NEED COVID-19 SCREENING  . Consult to hospitalist  . POC CBG, ED     Following Medications were ordered in ER: Medications  predniSONE (DELTASONE)  tablet 60 mg (60 mg Oral Given 06/30/20 1626)  colchicine tablet 0.3 mg (0.3 mg Oral Given 06/30/20 1637)  oxyCODONE-acetaminophen (PERCOCET/ROXICET) 5-325 MG per tablet 1 tablet (1 tablet Oral Given 06/30/20 1626)        Consult Orders  (From admission, onward)         Start     Ordered   06/30/20 1936  Consult to hospitalist  Once       Provider:  (Not yet assigned)  Question Answer Comment  Place call to: [facility]   Reason for Consult Admit      06/30/20 1935           Significant initial  Findings: Abnormal Labs Reviewed  CBC WITH DIFFERENTIAL/PLATELET - Abnormal; Notable for the following components:      Result Value   WBC 17.0 (*)    RBC 4.03 (*)    Hemoglobin  11.3 (*)    HCT 33.0 (*)    Neutro Abs 14.7 (*)    Abs Immature Granulocytes 0.21 (*)    All other components within normal limits  BASIC METABOLIC PANEL - Abnormal; Notable for the following components:   Chloride 95 (*)    CO2 19 (*)    Glucose, Bld 126 (*)    BUN 106 (*)    Creatinine, Ser 10.65 (*)    Calcium 8.6 (*)    GFR calc non Af Amer 4 (*)    GFR calc Af Amer 5 (*)    Anion gap 21 (*)    All other components within normal limits  LACTIC ACID, PLASMA - Abnormal; Notable for the following components:   Lactic Acid, Venous 2.0 (*)    All other components within normal limits  SEDIMENTATION RATE - Abnormal; Notable for the following components:   Sed Rate 105 (*)    All other components within normal limits  CBG MONITORING, ED - Abnormal; Notable for the following components:   Glucose-Capillary 119 (*)    All other components within normal limits   Otherwise labs showing:    Recent Labs  Lab 06/30/20 1604  NA 135  K 4.6  CO2 19*  GLUCOSE 126*  BUN 106*  CREATININE 10.65*  CALCIUM 8.6*    Cr  Up from baseline see below Lab Results  Component Value Date   CREATININE 10.65 (H) 06/30/2020   CREATININE 1.80 (H) 01/21/2020   CREATININE 1.90 (H) 01/21/2020    Recent Labs  Lab 06/30/20 2054  AST 20  ALT 20  ALKPHOS 66  BILITOT 0.8  PROT 7.0  ALBUMIN 2.5*   Lab Results  Component Value Date   CALCIUM 8.6 (L) 06/30/2020   WBC      Component Value Date/Time   WBC 17.0 (H) 06/30/2020 1604   LYMPHSABS 1.0 06/30/2020 1604   MONOABS 1.0 06/30/2020 1604   EOSABS 0.0 06/30/2020 1604   BASOSABS 0.0 06/30/2020 1604   Plt: Lab Results  Component Value Date   PLT 250 06/30/2020   Lactic Acid, Venous    Component Value Date/Time   LATICACIDVEN 2.0 (HH) 06/30/2020 1654   @ (RESUTFAST[lacticacid:4)@  Procalcitonin   Ordered    HG/HCT  stable,      Component Value Date/Time   HGB 11.3 (L) 06/30/2020 1604   HGB 13.0 05/23/2019 1720   HCT 33.0 (L)  06/30/2020 1604   HCT 38.5 05/23/2019 1720   MCV 81.9 06/30/2020 1604   MCV 83 05/23/2019 1720    Cardiac Panel (  last 3 results) No results for input(s): CKTOTAL, CKMB, TROPONINI, RELINDX in the last 72 hours.     ECG: Ordered Personally reviewed by me showing: HR : 86 Rhythm:  NSR,   no evidence of ischemic changes QTC*   DM  labs:  HbA1C: Recent Labs    10/31/19 1406  HGBA1C 6.3*     CBG (last 3)  Recent Labs    06/30/20 1630  GLUCAP 119*    UA   no evidence of UTI    Urine analysis:    Component Value Date/Time   COLORURINE YELLOW 06/30/2020 2140   APPEARANCEUR CLEAR 06/30/2020 2140   LABSPEC 1.011 06/30/2020 2140   PHURINE 5.0 06/30/2020 2140   GLUCOSEU NEGATIVE 06/30/2020 2140   HGBUR NEGATIVE 06/30/2020 2140   BILIRUBINUR NEGATIVE 06/30/2020 2140   BILIRUBINUR negative 10/01/2017 0901   BILIRUBINUR neg 04/02/2014 1605   KETONESUR NEGATIVE 06/30/2020 2140   PROTEINUR NEGATIVE 06/30/2020 2140   UROBILINOGEN 0.2 10/01/2017 0901   UROBILINOGEN 0.2 02/12/2015 2205   NITRITE NEGATIVE 06/30/2020 2140   LEUKOCYTESUR NEGATIVE 06/30/2020 2140   Ordered  renal US showing Hydronephrosis  CXR - retrocardiac opacity?    ED Triage Vitals  Enc Vitals Group     BP 06/30/20 1557 (!) 160/80     Pulse Rate 06/30/20 1557 89     Resp 06/30/20 1557 (!) 22     Temp 06/30/20 1557 (!) 97.5 F (36.4 C)     Temp Source 06/30/20 1557 Oral     SpO2 06/30/20 1507 98 %     Weight --      Height --      Head Circumference --      Peak Flow --      Pain Score --      Pain Loc --      Pain Edu? --      Excl. in [place]? --   TMAX(24)@      Latest  Blood pressure (!) 190/79, pulse 88, temperature (!) 97.5 F (36.4 C), temperature source Oral, resp. rate 18, SpO2 100 %.   Review of Systems:    Pertinent positives include , fatigue Bilateral lower extremity swelling   Constitutional:  No weight loss, night sweats, Fevers, chills, weight loss  HEENT:  No headaches, Difficulty  swallowing,Tooth/dental problems,Sore throat,  No sneezing, itching, ear ache, nasal congestion, post nasal drip,  Cardio-vascular:  No chest pain, Orthopnea, PND, anasarca, dizziness, palpitations.no GI:  No heartburn, indigestion, abdominal pain, nausea, vomiting, diarrhea, change in bowel habits, loss of appetite, melena, blood in stool, hematemesis Resp:  no shortness of breath at rest. No dyspnea on exertion, No excess mucus, no productive cough, No non-productive cough, No coughing up of blood.No change in color of mucus.No wheezing. Skin:  no rash or lesions. No jaundice GU:  no dysuria, change in color of urine, no urgency or frequency. No straining to urinate.  No flank pain.  Musculoskeletal:  No joint pain or no joint swelling. No decreased range of motion. No back pain.  Psych:  No change in mood or affect. No depression or anxiety. No memory loss.  Neuro: no localizing neurological complaints, no tingling, no weakness, no double vision, no gait abnormality, no slurred speech, no confusion  All systems reviewed and apart from [place] all are negative  Past Medical History:   Past Medical History:  Diagnosis Date  . Colon polyps 06/22/2014   4 colon polyps by colonoscopy.  Repeat 3 years.  [place].  . Diabetes mellitus   . Hypertension   . Other and unspecified hyperlipidemia   . PE (pulmonary embolism)      Past Surgical History:  Procedure Laterality Date  . COLONOSCOPY W/ POLYPECTOMY  06/22/2014   4 colon polyps; repeat 3 years; [place].    Social History:  Ambulatory   Independently     reports that he quit smoking about 44 years ago. His smoking use included cigars. He quit after 17.00 years of use. He has never used smokeless tobacco. He reports that he does not drink alcohol and does not use drugs.    Family History:   Family History  Problem Relation Age of Onset  . Diabetes Other     Allergies: No Known Allergies   Prior to Admission medications     Medication Sig Start Date End Date Taking? Authorizing Provider  hydrochlorothiazide (HYDRODIURIL) 25 MG tablet Take 1 tablet (25 mg total) by mouth daily. 10/31/19  Yes Stallings, Zoe A, MD  lisinopril (ZESTRIL) 40 MG tablet Take 1 tablet (40 mg total) by mouth daily. 10/31/19  Yes Forrest Moron, MD  metFORMIN (GLUCOPHAGE) 500 MG tablet Take 1 tablet (500 mg total) by mouth daily with breakfast. 10/31/19  Yes Nolon Rod, Zoe A, MD  ondansetron (ZOFRAN) 4 MG tablet Take 1 tablet (4 mg total) by mouth every 8 (eight) hours as needed for nausea or vomiting. 01/21/20  Yes Hayden Rasmussen, MD  traMADol (ULTRAM) 50 MG tablet Take 1 tablet (50 mg total) by mouth every 6 (six) hours as needed. Patient not taking: Reported on 06/30/2020 05/28/20   Larene Pickett, PA-C   Physical Exam: Vitals with BMI 06/30/2020 06/30/2020 06/30/2020  Height - - -  Weight - - -  BMI - - -  Systolic 532 992 426  Diastolic 79 96 75  Pulse 88 94 -   1. General:  in No Acute distress    Chronically ill/acutely ill -appearing 2. Psychological: Alert and   Oriented 3. Head/ENT:    Dry Mucous Membranes                          Head Non traumatic, neck supple                          Poor Dentition 4. SKIN:   decreased Skin turgor,  Skin clean Dry and intact no rash 5. Heart: Regular rate and rhythm no  Murmur, no Rub or gallop 6. Lungs:   no wheezes or crackles   7. Abdomen: Soft,  Diffusely tender,  distended   Obese bowel sounds present 8. Lower extremities: no clubbing, cyanosis,  Leg edema 2+ 9. Neurologically Grossly intact, moving all 4 extremities equally   10. MSK: Normal range of motion   All other LABS:     Recent Labs  Lab 06/30/20 1604  WBC 17.0*  NEUTROABS 14.7*  HGB 11.3*  HCT 33.0*  MCV 81.9  PLT 250     Recent Labs  Lab 06/30/20 1604  NA 135  K 4.6  CL 95*  CO2 19*  GLUCOSE 126*  BUN 106*  CREATININE 10.65*  CALCIUM 8.6*     No results for input(s): AST, ALT, ALKPHOS, BILITOT,  PROT, ALBUMIN in the last 168 hours.     Cultures: No results found for: [place], [place], [place], REPTSTATUS   Radiological Exams on Admission: DG Abd 1 View  Result Date: 06/30/2020 CLINICAL DATA:  Constipation EXAM: ABDOMEN - 1 VIEW COMPARISON:  None. FINDINGS: The bowel gas pattern is normal. No radio-opaque calculi or other significant radiographic abnormality are seen. The stool burden appears to be average. IMPRESSION: Negative. Electronically Signed   By: Constance Holster M.D.   On: 06/30/2020 21:58   US RENAL  Result Date: 06/30/2020 CLINICAL DATA:  Acute renal insufficiency. EXAM: RENAL / URINARY TRACT ULTRASOUND COMPLETE COMPARISON:  None. FINDINGS: Right Kidney: Renal measurements: 10.3 cm x 5.2 cm x 7.0 cm = volume: 195.7 mL. Echogenicity within normal limits. Mild to moderate severity right-sided hydronephrosis is seen. No mass is visualized. Left Kidney: Renal measurements: 10.3 cm x 5.6 cm x 5.3 cm = volume: 109.5 mL. Echogenicity within normal limits. There is mild left-sided hydronephrosis. A 1.7 cm x 1.2 cm x 1.3 cm anechoic structure is seen within the lower pole of the left kidney. No flow is seen within this region on color Doppler evaluation. Bladder: Appears normal for degree of bladder distention. Other: None. IMPRESSION: 1. Small left renal cyst. 2. Mild to moderate severity bilateral hydronephrosis, right greater than left. Electronically Signed   By: Virgina Norfolk M.D.   On: 06/30/2020 21:14   DG Chest Port 1 View  Result Date: 06/30/2020 CLINICAL DATA:  Shortness of breath EXAM: PORTABLE CHEST 1 VIEW COMPARISON:  Chest x-ray 01/21/2020, CT chest 07/28/2006. FINDINGS: The heart size and mediastinal contours are within normal limits. Aortic arch calcifications. Right base versus changes. Interval development of retrocardiac opacity. No pulmonary edema. No pleural effusion. No pneumothorax. No acute osseous abnormality. IMPRESSION: Interval development of retrocardiac  opacity that could represent infection/inflammation versus atelectasis. Electronically Signed   By: Iven Finn M.D.   On: 06/30/2020 20:18   CT RENAL STONE STUDY  Result Date: 06/30/2020 CLINICAL DATA:  Patient reports pain in bilateral feet for gout. Patient reports he was started on medication and that is has started to get worse again. Hydronephrosis. EXAM: CT ABDOMEN AND PELVIS WITHOUT CONTRAST TECHNIQUE: Multidetector CT imaging of the abdomen and pelvis was performed following the standard protocol without IV contrast. COMPARISON:  US renal 06/30/20. FINDINGS: Lower chest: Bilateral lower lobes atelectasis. no acute abnormality. Hepatobiliary: No focal liver abnormality is seen. Calcified gallstones within the gallbladder lumen. No gallbladder wall thickening or biliary dilatation. Pancreas: Unremarkable. No pancreatic ductal dilatation or surrounding inflammatory changes. Spleen: Normal in size without focal abnormality. Adrenals/Urinary Tract: No adrenal nodule bilaterally. Bilateral perinephric and periureteral fat stranding. subcentimeter hypodensity. No nephrolithiasis, no hydronephrosis, and otherwise no contour-deforming renal mass. No ureterolithiasis. Bilateral moderate hydroureter. The urinary bladder wall thickening and perivascular fat stranding. The foley catheter terminates in the urinary bladder. Stomach/Bowel: Stomach is within normal limits. Appendix appears normal. No evidence of bowel wall thickening, distention, or inflammatory changes. Vascular/Lymphatic: No significant vascular findings are present. No enlarged abdominal or pelvic lymph nodes. Reproductive: The prostate is enlarged measuring up to 5.4cm. Other: No abdominal wall hernia or abnormality. No abdominopelvic ascites. Musculoskeletal: No acute or significant osseous findings. IMPRESSION: 1. Bilateral moderate hydroureter, bilateral perinephrenic/periureteral fat stranding, and perivesicular fat stranding. No radiopaque  ureteronephrolithiasis. Findings likely represents infection. Underlying intraluminal lesion within the collecting systems or non-radiopaque stones cannot be excluded. 2. Cholelithiasis. Electronically Signed   By: Iven Finn M.D.   On: 06/30/2020 23:39    Chart has been reviewed   Assessment/Plan   86 y.o. male with medical history significant of HTN, DM 2 hx of PE  CKD  Admitted for AKI and hydronephrosis  Present on Admission: . AKI (acute kidney injury) ([place]) -suspect obstructive picture be given hydronephrosis.  Will check urine electrolytes Appreciate nephrology consult There could be some component of dehydration as well given decreased p.o. intake lately. Gently rehydrate Place Foley Follow kidney function  . Uncontrolled hypertension -hold lisinopril hydrochlorothiazide currently given severe AKI. If needed may try hydralazine as needed  . Hydronephrosis unclear etiology renal ultrasound did not show Significant Bladder Distention but will Place Foley in case is more of a chronic picture. Obtain CT renal protocol to further evaluate cause of hydronephrosis if needed may need urology consult No evidence of UTI Touched base with Urology at night, If UA showing no signs of UTI most likley standing is reactive  Place foley and observe If no improvement please re consult UROLOGY     Dm 2-  - Order Sensitive    SSI   -  check TSH and HgA1C  - Hold by mouth medications   Abnormal chest x-ray patient denies any coughing or fever would be indicative of pneumonia.  May need further imaging to reevaluate and follow Given elevated white blood cell count In the unclear picture can cover for tonight for possible pneumonia Reassess in the a.m. and de-escalate if needed  Anemia will obtain anemia panel Other plan as per orders.  Gout - reports bilateral feet swelling and pain unclear if due to gout appears atypical, There was some concern re septic joints (again somewhat  atypical presentation)   orthopedics will see in AM  Er provider ordered one dose of colchicine and prednisone  DVT prophylaxis:  SCD     Code Status:    Code Status: Not on file FULL CODE  as per patient  I had personally discussed CODE STATUS with patient    Family Communication:   Family   at  Bedside  plan of care was discussed  with   Son   Disposition Plan:     To home once workup is complete and patient is stable    Following barriers for discharge:                            Electrolytes corrected                                                           white count improving able to transition to PO antibiotics                                                         Will likely need home health,                             Will need consultants to evaluate patient prior to discharge                      Would benefit from PT/OT eval prior to DC  Ordered  Consults called: orthopedics, nephrology  Admission status:  ED Disposition    ED Disposition Condition Comment   Admit  The patient appears reasonably stabilized for admission considering the current resources, flow, and capabilities available in the ED at this time, and I doubt any other [facility] requiring further screening and/or treatment in the ED prior to admission is  present.          inpatient     I Expect 2 midnight stay secondary to severity of patient's current illness need for inpatient interventions justified by the following:   Severe lab/radiological/exam abnormalities including:    AKI ,lactic acidosis and extensive comorbidities including:  DM2  .   CKD   That are currently affecting medical management.   I expect  patient to be hospitalized for 2 midnights requiring inpatient medical care.  Patient is at high risk for adverse outcome (such as loss of life or disability) if not treated.  Indication for inpatient stay as follows:  Severe AKI Need for IV  antibiotics, IV fluids,     Level of care    Tele 24H      Lab Results  Component Value Date   [place] NEGATIVE 06/30/2020     Precautions: admitted as Covid Negative     PPE: Used by the provider:   P100  eye Goggles,  Gloves     Cachet Mccutchen 07/01/2020, 1:54 AM    [facility]     after 2 AM please page floor coverage PA If 7AM-7PM, please contact the day team taking care of the patient using [email or web address]   Patient was evaluated in the context of the global COVID-19 pandemic, which necessitated consideration that the patient might be at risk for infection with the SARS-CoV-2 virus that causes COVID-19. Institutional protocols and algorithms that pertain to the evaluation of patients at risk for COVID-19 are in a state of rapid change based on information released by regulatory bodies including the CDC and federal and state organizations. These policies and algorithms were followed during the patient's care.

## 2020-06-30 NOTE — ED Notes (Signed)
Pts pulse ox reading 67% in triage. Pt denies wearing O2 at home. Pt denies SHOB and chest pain.

## 2020-06-30 NOTE — ED Provider Notes (Addendum)
[facility] DEPT Provider Note   CSN: [id number] Arrival date & time: 06/30/20  1459     History Chief Complaint  Patient presents with  . Gout    Sergio [name] is a 86 y.o. male.  86 year old male presents with bilateral foot and ankle pain consistent with his prior gout exacerbations.  Patient states that despite being started on medications his symptoms have become worse.  Denies any fever chills.  No cough or shortness of breath.  Denies any knee or elbow discomfort.  Symptoms are worse with walking characterizes dull and aching and better with remaining still.        Past Medical History:  Diagnosis Date  . Colon polyps 06/22/2014   4 colon polyps by colonoscopy.  Repeat 3 years.  [place].  . Diabetes mellitus   . Hypertension   . Other and unspecified hyperlipidemia   . PE (pulmonary embolism)     Patient Active Problem List   Diagnosis Date Noted  . Pain due to onychomycosis of toenails of both feet 10/13/2019  . Urinary frequency 10/01/2017  . Type 2 diabetes mellitus without complication, without long-term current use of insulin ([place]) 10/01/2017  . Loss of weight 03/24/2017  . Hypoglycemia 03/24/2017  . Iatrogenic hypotension 03/24/2017  . Nonspecific abnormal electrocardiogram (ECG) (EKG) 02/02/2017  . Colon polyps 07/04/2014  . Controlled diabetes mellitus without long-term current use of insulin ([place]) 12/18/2011  . Uncontrolled hypertension   . Other and unspecified hyperlipidemia     Past Surgical History:  Procedure Laterality Date  . COLONOSCOPY W/ POLYPECTOMY  06/22/2014   4 colon polyps; repeat 3 years; [place].       Family History  Problem Relation Age of Onset  . Diabetes Other     Social History   Tobacco Use  . Smoking status: Former Smoker    Years: 17.00    Types: Cigars    Quit date: 09/29/1975    Years since quitting: 44.7  . Smokeless tobacco: Never Used  Substance Use Topics  . Alcohol use: No  .  Drug use: No    Home Medications Prior to Admission medications   Medication Sig Start Date End Date Taking? Authorizing Provider  hydrochlorothiazide (HYDRODIURIL) 25 MG tablet Take 1 tablet (25 mg total) by mouth daily. 10/31/19   Forrest Moron, MD  lisinopril (ZESTRIL) 40 MG tablet Take 1 tablet (40 mg total) by mouth daily. 10/31/19   Forrest Moron, MD  metFORMIN (GLUCOPHAGE) 500 MG tablet Take 1 tablet (500 mg total) by mouth daily with breakfast. 10/31/19   Forrest Moron, MD  ondansetron (ZOFRAN) 4 MG tablet Take 1 tablet (4 mg total) by mouth every 8 (eight) hours as needed for nausea or vomiting. 01/21/20   Hayden Rasmussen, MD  OVER THE COUNTER MEDICATION Blood pressure factors  One pill per day    [provider]  OVER THE COUNTER MEDICATION Glucose support One pill per day    [provider]  traMADol (ULTRAM) 50 MG tablet Take 1 tablet (50 mg total) by mouth every 6 (six) hours as needed. 05/28/20   Larene Pickett, PA-C    Allergies    Patient has no known allergies.  Review of Systems   Review of Systems  All other systems reviewed and are negative.   Physical Exam Updated Vital Signs BP (!) 160/80 (BP Location: Left Arm)   Pulse 89   Temp (!) 97.5 F (36.4 C) (Oral)  Resp (!) 22   SpO2 93%   Physical Exam Vitals and nursing note reviewed.  Constitutional:      General: He is not in acute distress.    Appearance: Normal appearance. He is well-developed. He is not toxic-appearing.  HENT:     Head: Normocephalic and atraumatic.  Eyes:     General: Lids are normal.     Conjunctiva/sclera: Conjunctivae normal.     Pupils: Pupils are equal, round, and reactive to light.  Neck:     Thyroid: No thyroid mass.     Trachea: No tracheal deviation.  Cardiovascular:     Rate and Rhythm: Normal rate and regular rhythm.     Heart sounds: Normal heart sounds. No murmur heard.  No gallop.   Pulmonary:     Effort: Pulmonary effort is normal. No  respiratory distress.     Breath sounds: Normal breath sounds. No stridor. No decreased breath sounds, wheezing, rhonchi or rales.  Abdominal:     General: Bowel sounds are normal. There is no distension.     Palpations: Abdomen is soft.     Tenderness: There is no abdominal tenderness. There is no rebound.  Musculoskeletal:        General: No tenderness. Normal range of motion.     Cervical back: Normal range of motion and neck supple.       Legs:  Skin:    General: Skin is warm and dry.     Findings: No abrasion or rash.  Neurological:     Mental Status: He is alert and oriented to person, place, and time.     GCS: GCS eye subscore is 4. GCS verbal subscore is 5. GCS motor subscore is 6.     Cranial Nerves: No cranial nerve deficit.     Sensory: No sensory deficit.  Psychiatric:        Speech: Speech normal.        Behavior: Behavior normal.     ED Results / Procedures / Treatments   Labs (all labs ordered are listed, but only abnormal results are displayed) Labs Reviewed  CBC WITH DIFFERENTIAL/PLATELET  BASIC METABOLIC PANEL  CBG MONITORING, ED    EKG None  Radiology No results found.  Procedures Procedures (including critical care time)  Medications Ordered in ED Medications  predniSONE (DELTASONE) tablet 60 mg (has no administration in time range)  colchicine tablet 0.3 mg (has no administration in time range)  oxyCODONE-acetaminophen (PERCOCET/ROXICET) 5-325 MG per tablet 1 tablet (has no administration in time range)    ED Course  I have reviewed the triage vital signs and the nursing notes.  Pertinent labs & imaging results that were available during my care of the patient were reviewed by me and considered in my medical decision making (see chart for details).    MDM Rules/Calculators/A&P                          Patient's pulse oximetry was reported erroneously.  He is high percent on room air.  Medicated for pain here.  Labs did return a  leukocytosis of 17,000 along with elevated sed rate.  He however also appears to be in renal failure.  His creatinine is 11.  Patient is on a diuretic as well as an ACE inhibitor.  Will check a bladder scan to make sure he does not have a obstructive etiology.  Will consult nephrology.  Feel that patient's edema is from his  renal failure.  Will consult orthopedics due to patient's elevated sed rate as well as lactate for possibility of septic joints.  Will admit to the hospital service Final Clinical Impression(s) / ED Diagnoses Final diagnoses:  None    Rx / DC Orders ED Discharge Orders    None       Lacretia Leigh, MD 06/30/20 1900    Lacretia Leigh, MD 06/30/20 1935

## 2020-06-30 NOTE — ED Triage Notes (Signed)
Patient reports pain in bilateral feet for gout. Patient reports he was started on medication and that is has started to get worse again.

## 2020-07-01 ENCOUNTER — Inpatient Hospital Stay (HOSPITAL_COMMUNITY): Payer: PPO

## 2020-07-01 DIAGNOSIS — R52 Pain, unspecified: Secondary | ICD-10-CM

## 2020-07-01 DIAGNOSIS — M7989 Other specified soft tissue disorders: Secondary | ICD-10-CM | POA: Diagnosis not present

## 2020-07-01 DIAGNOSIS — R7989 Other specified abnormal findings of blood chemistry: Secondary | ICD-10-CM

## 2020-07-01 DIAGNOSIS — D72825 Bandemia: Secondary | ICD-10-CM

## 2020-07-01 DIAGNOSIS — R5381 Other malaise: Secondary | ICD-10-CM

## 2020-07-01 DIAGNOSIS — E1165 Type 2 diabetes mellitus with hyperglycemia: Secondary | ICD-10-CM

## 2020-07-01 DIAGNOSIS — R4189 Other symptoms and signs involving cognitive functions and awareness: Secondary | ICD-10-CM

## 2020-07-01 DIAGNOSIS — M109 Gout, unspecified: Secondary | ICD-10-CM

## 2020-07-01 LAB — COMPREHENSIVE METABOLIC PANEL
ALT: 19 U/L (ref 0–44)
AST: 14 U/L — ABNORMAL LOW (ref 15–41)
Albumin: 2.3 g/dL — ABNORMAL LOW (ref 3.5–5.0)
Alkaline Phosphatase: 54 U/L (ref 38–126)
Anion gap: 18 — ABNORMAL HIGH (ref 5–15)
BUN: 112 mg/dL — ABNORMAL HIGH (ref 8–23)
CO2: 18 mmol/L — ABNORMAL LOW (ref 22–32)
Calcium: 8.5 mg/dL — ABNORMAL LOW (ref 8.9–10.3)
Chloride: 104 mmol/L (ref 98–111)
Creatinine, Ser: 7.01 mg/dL — ABNORMAL HIGH (ref 0.61–1.24)
GFR calc Af Amer: 8 mL/min — ABNORMAL LOW (ref >60)
GFR calc non Af Amer: 6 mL/min — ABNORMAL LOW (ref >60)
Glucose, Bld: 127 mg/dL — ABNORMAL HIGH (ref 70–99)
Potassium: 3.5 mmol/L (ref 3.5–5.1)
Sodium: 140 mmol/L (ref 135–145)
Total Bilirubin: 0.9 mg/dL (ref 0.3–1.2)
Total Protein: 6.7 g/dL (ref 6.5–8.1)

## 2020-07-01 LAB — PREALBUMIN: Prealbumin: 5.5 mg/dL — ABNORMAL LOW (ref 18–38)

## 2020-07-01 LAB — GLUCOSE, CAPILLARY
Glucose-Capillary: 121 mg/dL — ABNORMAL HIGH (ref 70–99)
Glucose-Capillary: 118 mg/dL — ABNORMAL HIGH (ref 70–99)
Glucose-Capillary: 123 mg/dL — ABNORMAL HIGH (ref 70–99)
Glucose-Capillary: 111 mg/dL — ABNORMAL HIGH (ref 70–99)
Glucose-Capillary: 170 mg/dL — ABNORMAL HIGH (ref 70–99)

## 2020-07-01 LAB — RETICULOCYTES
Immature Retic Fract: 12.8 % (ref 2.3–15.9)
RBC.: 3.61 MIL/uL — ABNORMAL LOW (ref 4.22–5.81)
Retic Count, Absolute: 26.7 10*3/uL (ref 19.0–186.0)
Retic Ct Pct: 0.7 % (ref 0.4–3.1)

## 2020-07-01 LAB — CBC WITH DIFFERENTIAL/PLATELET
Abs Immature Granulocytes: 0.33 10*3/uL — ABNORMAL HIGH (ref 0.00–0.07)
Basophils Absolute: 0 10*3/uL (ref 0.0–0.1)
Basophils Relative: 0 %
Eosinophils Absolute: 0 10*3/uL (ref 0.0–0.5)
Eosinophils Relative: 0 %
HCT: 29.2 % — ABNORMAL LOW (ref 39.0–52.0)
Hemoglobin: 10.2 g/dL — ABNORMAL LOW (ref 13.0–17.0)
Immature Granulocytes: 2 %
Lymphocytes Relative: 4 %
Lymphs Abs: 0.6 10*3/uL — ABNORMAL LOW (ref 0.7–4.0)
MCH: 28.3 pg (ref 26.0–34.0)
MCHC: 34.9 g/dL (ref 30.0–36.0)
MCV: 81.1 fL (ref 80.0–100.0)
Monocytes Absolute: 0.2 10*3/uL (ref 0.1–1.0)
Monocytes Relative: 1 %
Neutro Abs: 13.7 10*3/uL — ABNORMAL HIGH (ref 1.7–7.7)
Neutrophils Relative %: 93 %
Platelets: 236 10*3/uL (ref 150–400)
RBC: 3.6 MIL/uL — ABNORMAL LOW (ref 4.22–5.81)
RDW: 14.5 % (ref 11.5–15.5)
WBC: 14.8 10*3/uL — ABNORMAL HIGH (ref 4.0–10.5)
nRBC: 0 % (ref 0.0–0.2)

## 2020-07-01 LAB — IRON AND TIBC
Iron: 44 ug/dL — ABNORMAL LOW (ref 45–182)
Saturation Ratios: 33 % (ref 17.9–39.5)
TIBC: 132 ug/dL — ABNORMAL LOW (ref 250–450)
UIBC: 88 ug/dL

## 2020-07-01 LAB — FOLATE: Folate: 6.9 ng/mL (ref >5.9)

## 2020-07-01 LAB — VITAMIN B12: Vitamin B-12: 506 pg/mL (ref 180–914)

## 2020-07-01 LAB — HEMOGLOBIN A1C
Hgb A1c MFr Bld: 6.7 % — ABNORMAL HIGH (ref 4.8–5.6)
Mean Plasma Glucose: 145.59 mg/dL

## 2020-07-01 LAB — MAGNESIUM: Magnesium: 2.7 mg/dL — ABNORMAL HIGH (ref 1.7–2.4)

## 2020-07-01 LAB — TSH: TSH: 1.562 u[IU]/mL (ref 0.350–4.500)

## 2020-07-01 LAB — PHOSPHORUS: Phosphorus: 6.1 mg/dL — ABNORMAL HIGH (ref 2.5–4.6)

## 2020-07-01 LAB — FERRITIN: Ferritin: 1936 ng/mL — ABNORMAL HIGH (ref 24–336)

## 2020-07-01 LAB — STREP PNEUMONIAE URINARY ANTIGEN: Strep Pneumo Urinary Antigen: NEGATIVE

## 2020-07-01 MED ORDER — HEPARIN BOLUS VIA INFUSION
2000.0000 [IU] | Freq: Once | INTRAVENOUS | Status: AC
  Administered 2020-07-01: 2000 [IU] via INTRAVENOUS
  Filled 2020-07-01: qty 2000

## 2020-07-01 MED ORDER — PREDNISONE 20 MG PO TABS
30.0000 mg | ORAL_TABLET | Freq: Every day | ORAL | Status: DC
  Administered 2020-07-01 – 2020-07-04 (×4): 30 mg via ORAL
  Filled 2020-07-01 (×4): qty 1

## 2020-07-01 MED ORDER — HEPARIN (PORCINE) 25000 UT/250ML-% IV SOLN
1000.0000 [IU]/h | INTRAVENOUS | Status: DC
  Administered 2020-07-01 – 2020-07-02 (×2): 1000 [IU]/h via INTRAVENOUS
  Filled 2020-07-01 (×3): qty 250

## 2020-07-01 MED ORDER — SODIUM CHLORIDE 0.9 % IV SOLN: INTRAVENOUS | Status: DC

## 2020-07-01 MED ORDER — OXYCODONE HCL 5 MG PO TABS: 5.0000 mg | ORAL_TABLET | Freq: Three times a day (TID) | ORAL | Status: DC | PRN

## 2020-07-01 MED ORDER — HYDRALAZINE HCL 25 MG PO TABS: 25.0000 mg | ORAL_TABLET | Freq: Four times a day (QID) | ORAL | Status: DC | PRN

## 2020-07-01 MED ORDER — ACETAMINOPHEN 500 MG PO TABS
500.0000 mg | ORAL_TABLET | Freq: Three times a day (TID) | ORAL | Status: DC
  Administered 2020-07-01 – 2020-07-07 (×18): 500 mg via ORAL
  Filled 2020-07-01 (×18): qty 1

## 2020-07-01 MED ORDER — CHLORHEXIDINE GLUCONATE CLOTH 2 % EX PADS
6.0000 | MEDICATED_PAD | Freq: Every day | CUTANEOUS | Status: DC
  Administered 2020-07-01 – 2020-07-07 (×7): 6 via TOPICAL

## 2020-07-01 NOTE — Progress Notes (Signed)
Lower extremity venous has been completed.   Preliminary results in CV Proc.   Abram Sander 07/01/2020 2:06 PM

## 2020-07-01 NOTE — Progress Notes (Signed)
OT Cancellation Note  Patient Details Name: CONSTANT MANDEVILLE MRN: [id number] DOB: 10/31/1932   Cancelled Treatment:    Reason Eval/Treat Not Completed: Fatigue/lethargy limiting ability to participate  Will check on pt next day Kari Baars, [place] [phone] Office- [phone], Thereasa Parkin 07/01/2020, 6:20 PM

## 2020-07-01 NOTE — Progress Notes (Signed)
PROGRESS NOTE  KATHY WARES EXH:[id number] DOB: Nov 17, 1932   PCP: Forrest Moron, MD  Patient is from: Home  DOA: 06/30/2020 LOS: 1  Brief Narrative / Interim history: 86 year old male with history of DM-2, PE, CKD-3B, HTN, gout and possible cognitive impairment presenting with right foot pain and generalized weakness. Had a ED visit on 05/28/2020 for right foot pain. Although there was suspicion for gout at that time, he was not given colchicine due to CKD nor prednisone due to diabetes and discharged on tramadol. He returns with increased right foot pain, generalized weakness and BLE edema.  In ED, hemodynamically stable. WBC 17.0 with left shift. Cr 10.65 (1.18 10/2019). BUN 106. Bicarb 19. LA 2.0. Nephrology consulted. Renal ultrasound ordered and concern about mild to moderate bilateral hydronephrosis. CT abdomen and pelvis revealed bilateral moderate hydronephrosis with possible pyelonephritis. Also concern about left lung pneumonia with chest x-ray revealing retrocardiac opacity. Started on IV fluid, ceftriaxone and azithromycin and admitted for AKI on CKD-3B. Uric acid elevated  Subjective: Seen and examined earlier this morning. No major events overnight or this morning. Reports pain in right leg. He points at his right knee. He is awake and alert but only oriented to self. He thinks he is at church. He also thinks he saw me at church in the past. Responds no to chest pain, belly pain or shortness of breath.  Objective: Vitals:   07/01/20 0128 07/01/20 0131 07/01/20 0611 07/01/20 1021  BP:  (!) 184/96 130/62 (!) 150/74  Pulse: 82 89 87 92  Resp:  17 16 16   Temp: 98 F (36.7 C) 98 F (36.7 C) 98.6 F (37 C) 98.4 F (36.9 C)  TempSrc: Oral  Oral Oral  SpO2: 91% (!) 86% 99% 100%    Intake/Output Summary (Last 24 hours) at 07/01/2020 1219 Last data filed at 07/01/2020 3810 Gross per 24 hour  Intake --  Output 3825 ml  Net -3825 ml   There were no vitals filed for this  visit.  Examination:  GENERAL: No apparent distress.  Nontoxic. HEENT: MMM.  Vision and hearing grossly intact.  NECK: Supple.  No apparent JVD.  RESP: On room air. No IWOB.  Fair aeration bilaterally. CVS:  RRR. Heart sounds normal.  ABD/GI/GU: BS+. Abd soft, NTND. No CVA tenderness. MSK/EXT:  Moves extremities. 1+ pitting edema in RLE. Trace edema in LLE. No tenderness around his ankle but 1-2 pedal edema. ?tenderness with right knee palpation SKIN: no apparent skin lesion or wound NEURO: Awake and alert. Oriented to self. No apparent focal neuro deficit. PSYCH: Calm. Normal affect.  Procedures:  None  Microbiology summarized: COVID-19 PCR negative. Influenza PCR negative. Blood cultures NGTD.  Assessment & Plan: AKI on CKD-3B/azotemia-suspect this to be due to obstructive etiology/hydronephrosis. Also on HCTZ and lisinopril which could contribute. This seems to be improving. He had about 3.8 L urine output overnight. Postobstructive diuresis? -Continue IV fluid. -Follow nephrology recommendation -Hold nephrotoxic meds -Continue indwelling Foley catheter  Moderate bilateral hydronephrosis: No mention of nephrolithiasis. History of BPH? Vesicoureteral reflux? -Continue indwelling Foley -Will run by urology.  Uncontrolled hypertension: BP improved. -P.o. hydralazine as needed with parameters. -Hold home lisinopril and HCTZ in the setting of AKI  Controlled DM-2 with hyperglycemia: A1c 6.7%. Recent Labs  Lab 06/30/20 2120 06/30/20 2313 07/01/20 0404 07/01/20 0757 07/01/20 1206  GLUCAP 125* 98 121* 118* 123*  -SSI-thin -CBG monitoring   Right lower extremity swelling/pain-may point to start his right knee. Has history of gout.  Has leukocytosis and elevated uric acid. Does not seem to have significant tenderness in right ankle or right foot. Received prednisone 60 mg in ED -Lower extremity Doppler to exclude DVT -He is on ceftriaxone and azithromycin for presumed  pneumonia -Start low-dose prednisone at 30 mg daily -Scheduled Tylenol with as needed oxycodone for pain control  History of gout -Prednisone as above  Abnormal chest x-ray: Some suspicion for pneumonia but no respiratory symptoms. Has leukocytosis and elevated procalcitonin -Continue ceftriaxone and azithromycin empirically  Cognitive impairment: Unclear baseline. Only oriented to self. -Reorientation and delirium precautions  Debility/generalized weakness -PT/OT eval  History of PE? Not on anticoagulation.  Normocytic anemia: Baseline Hgb 13-15> 11.3 (admit)> 10.2.  There is no height or weight on file to calculate BMI.         DVT prophylaxis:  SCDs Start: 06/30/20 2304  Code Status: Full code Family Communication: Attempted to call patient's son but no answer. He has no Surveyor, mining. Status is: Inpatient  Remains inpatient appropriate because:Unsafe d/c plan, IV treatments appropriate due to intensity of illness or inability to take PO and Inpatient level of care appropriate due to severity of illness   Dispo: The patient is from: Home              Anticipated d/c is to: To be determined              Anticipated d/c date is: 3 days              Patient currently is not medically stable to d/c.       Consultants:  Nephrology   Sch Meds:  Scheduled Meds: . Chlorhexidine Gluconate Cloth  6 each Topical Daily  . docusate sodium  100 mg Oral BID  . insulin aspart  0-9 Units Subcutaneous Q4H  . sodium chloride flush  3 mL Intravenous Q12H   Continuous Infusions: . azithromycin Stopped (07/01/20 0108)  . cefTRIAXone (ROCEPHIN)  IV Stopped (06/30/20 2321)   PRN Meds:.acetaminophen **OR** acetaminophen, HYDROcodone-acetaminophen, ondansetron **OR** ondansetron (ZOFRAN) IV, senna-docusate  Antimicrobials: Anti-infectives (From admission, onward)   Start     Dose/Rate Route Frequency Ordered Stop   06/30/20 2300  cefTRIAXone (ROCEPHIN) 2 g in sodium  chloride 0.9 % 100 mL IVPB        2 g 200 mL/hr over 30 Minutes Intravenous Every 24 hours 06/30/20 2242 07/05/20 2259   06/30/20 2300  azithromycin (ZITHROMAX) 500 mg in sodium chloride 0.9 % 250 mL IVPB        500 mg 250 mL/hr over 60 Minutes Intravenous Every 24 hours 06/30/20 2242 07/05/20 2259       I have personally reviewed the following labs and images: CBC: Recent Labs  Lab 06/30/20 1604 07/01/20 0525  WBC 17.0* 14.8*  NEUTROABS 14.7* 13.7*  HGB 11.3* 10.2*  HCT 33.0* 29.2*  MCV 81.9 81.1  PLT 250 236   BMP &GFR Recent Labs  Lab 06/30/20 1604 06/30/20 2053 07/01/20 0525  NA 135  --  140  K 4.6  --  3.5  CL 95*  --  104  CO2 19*  --  18*  GLUCOSE 126*  --  127*  BUN 106*  --  112*  CREATININE 10.65*  --  7.01*  CALCIUM 8.6*  --  8.5*  MG  --  3.0* 2.7*  PHOS  --   --  6.1*   CrCl cannot be calculated (Unknown ideal weight.). Liver & Pancreas: Recent Labs  Lab  06/30/20 2054 07/01/20 0525  AST 20 14*  ALT 20 19  ALKPHOS 66 54  BILITOT 0.8 0.9  PROT 7.0 6.7  ALBUMIN 2.5* 2.3*   No results for input(s): LIPASE, AMYLASE in the last 168 hours. No results for input(s): AMMONIA in the last 168 hours. Diabetic: Recent Labs    06/30/20 2053  HGBA1C 6.7*   Recent Labs  Lab 06/30/20 2120 06/30/20 2313 07/01/20 0404 07/01/20 0757 07/01/20 1206  GLUCAP 125* 98 121* 118* 123*   Cardiac Enzymes: Recent Labs  Lab 06/30/20 2053  CKTOTAL 200   No results for input(s): PROBNP in the last 8760 hours. Coagulation Profile: Recent Labs  Lab 06/30/20 2053  INR 1.4*   Thyroid Function Tests: Recent Labs    07/01/20 0525  TSH 1.562   Lipid Profile: No results for input(s): CHOL, HDL, LDLCALC, TRIG, CHOLHDL, LDLDIRECT in the last 72 hours. Anemia Panel: Recent Labs    07/01/20 0525  VITAMINB12 506  FOLATE 6.9  FERRITIN 1,936*  TIBC 132*  IRON 44*  RETICCTPCT 0.7   Urine analysis:    Component Value Date/Time   COLORURINE YELLOW  06/30/2020 2140   APPEARANCEUR CLEAR 06/30/2020 2140   LABSPEC 1.011 06/30/2020 2140   PHURINE 5.0 06/30/2020 2140   GLUCOSEU NEGATIVE 06/30/2020 2140   HGBUR NEGATIVE 06/30/2020 2140   BILIRUBINUR NEGATIVE 06/30/2020 2140   BILIRUBINUR negative 10/01/2017 0901   BILIRUBINUR neg 04/02/2014 1605   KETONESUR NEGATIVE 06/30/2020 2140   PROTEINUR NEGATIVE 06/30/2020 2140   UROBILINOGEN 0.2 10/01/2017 0901   UROBILINOGEN 0.2 02/12/2015 2205   NITRITE NEGATIVE 06/30/2020 2140   LEUKOCYTESUR NEGATIVE 06/30/2020 2140   Sepsis Labs: Invalid input(s): PROCALCITONIN, [place]  Microbiology: Recent Results (from the past 240 hour(s))  Culture, blood (Routine X 2) w Reflex to ID Panel     Status: None (Preliminary result)   Collection Time: 06/30/20  4:54 PM   Specimen: BLOOD  Result Value Ref Range Status   Specimen Description   Final    BLOOD RIGHT ANTECUBITAL Performed at [facility], [place] [address], [place], [place] [address]    Special Requests   Final    BOTTLES DRAWN AEROBIC AND ANAEROBIC Blood Culture adequate volume Performed at [facility] [address] [place], [place] [address]    Culture   Final    NO GROWTH < 12 HOURS Performed at [facility] [address], [place], [place] [address]    Report Status PENDING  Incomplete  Culture, blood (Routine X 2) w Reflex to ID Panel     Status: None (Preliminary result)   Collection Time: 06/30/20  4:59 PM   Specimen: BLOOD  Result Value Ref Range Status   Specimen Description   Final    BLOOD RIGHT ANTECUBITAL Performed at [facility], [place] [address] [place], [place] [address]    Special Requests   Final    BOTTLES DRAWN AEROBIC AND ANAEROBIC Blood Culture adequate volume Performed at [facility] [address], [place], [place] [address]    Culture   Final    NO GROWTH < 12 HOURS Performed at [facility] [address], [place], [place]  [address]    Report Status PENDING  Incomplete  Respiratory Panel by RT PCR (Flu A&B, Covid) - Nasopharyngeal Swab     Status: None   Collection Time: 06/30/20  8:53 PM   Specimen: Nasopharyngeal Swab  Result Value Ref  Range Status   SARS Coronavirus 2 by RT PCR NEGATIVE NEGATIVE Final    Comment: (NOTE) SARS-CoV-2 target nucleic acids are NOT DETECTED.  The SARS-CoV-2 RNA is generally detectable in upper respiratoy specimens during the acute phase of infection. The lowest concentration of SARS-CoV-2 viral copies this assay can detect is 131 copies/mL. A negative result does not preclude SARS-Cov-2 infection and should not be used as the sole basis for treatment or other patient management decisions. A negative result may occur with  improper specimen collection/handling, submission of specimen other than nasopharyngeal swab, presence of viral mutation(s) within the areas targeted by this assay, and inadequate number of viral copies (<131 copies/mL). A negative result must be combined with clinical observations, patient history, and epidemiological information. The expected result is Negative.  Fact Sheet for Patients:  PinkCheek.be  Fact Sheet for Healthcare Providers:  GravelBags.it  This test is no t yet approved or cleared by the [place] FDA and  has been authorized for detection and/or diagnosis of SARS-CoV-2 by FDA under an Emergency Use Authorization (EUA). This EUA will remain  in effect (meaning this test can be used) for the duration of the COVID-19 declaration under Section 564(b)(1) of the Act, 21 U.S.C. section 360bbb-3(b)(1), unless the authorization is terminated or revoked sooner.     Influenza A by PCR NEGATIVE NEGATIVE Final   Influenza B by PCR NEGATIVE NEGATIVE Final    Comment: (NOTE) The Xpert Xpress SARS-CoV-2/FLU/RSV assay is intended as an aid in  the diagnosis of influenza from  Nasopharyngeal swab specimens and  should not be used as a sole basis for treatment. Nasal washings and  aspirates are unacceptable for Xpert Xpress SARS-CoV-2/FLU/RSV  testing.  Fact Sheet for Patients: PinkCheek.be  Fact Sheet for Healthcare Providers: GravelBags.it  This test is not yet approved or cleared by the [place] FDA and  has been authorized for detection and/or diagnosis of SARS-CoV-2 by  FDA under an Emergency Use Authorization (EUA). This EUA will remain  in effect (meaning this test can be used) for the duration of the  Covid-19 declaration under Section 564(b)(1) of the Act, 21  U.S.C. section 360bbb-3(b)(1), unless the authorization is  terminated or revoked. Performed at [facility], [place] [address] [place], [place] [address]     Radiology Studies: DG Abd 1 View  Result Date: 06/30/2020 CLINICAL DATA:  Constipation EXAM: ABDOMEN - 1 VIEW COMPARISON:  None. FINDINGS: The bowel gas pattern is normal. No radio-opaque calculi or other significant radiographic abnormality are seen. The stool burden appears to be average. IMPRESSION: Negative. Electronically Signed   By: Constance Holster M.D.   On: 06/30/2020 21:58   US RENAL  Result Date: 06/30/2020 CLINICAL DATA:  Acute renal insufficiency. EXAM: RENAL / URINARY TRACT ULTRASOUND COMPLETE COMPARISON:  None. FINDINGS: Right Kidney: Renal measurements: 10.3 cm x 5.2 cm x 7.0 cm = volume: 195.7 mL. Echogenicity within normal limits. Mild to moderate severity right-sided hydronephrosis is seen. No mass is visualized. Left Kidney: Renal measurements: 10.3 cm x 5.6 cm x 5.3 cm = volume: 109.5 mL. Echogenicity within normal limits. There is mild left-sided hydronephrosis. A 1.7 cm x 1.2 cm x 1.3 cm anechoic structure is seen within the lower pole of the left kidney. No flow is seen within this region on color Doppler evaluation. Bladder: Appears  normal for degree of bladder distention. Other: None. IMPRESSION: 1. Small left renal cyst. 2. Mild to moderate severity bilateral hydronephrosis, right greater than  left. Electronically Signed   By: Virgina Norfolk M.D.   On: 06/30/2020 21:14   DG Chest Port 1 View  Result Date: 06/30/2020 CLINICAL DATA:  Shortness of breath EXAM: PORTABLE CHEST 1 VIEW COMPARISON:  Chest x-ray 01/21/2020, CT chest 07/28/2006. FINDINGS: The heart size and mediastinal contours are within normal limits. Aortic arch calcifications. Right base versus changes. Interval development of retrocardiac opacity. No pulmonary edema. No pleural effusion. No pneumothorax. No acute osseous abnormality. IMPRESSION: Interval development of retrocardiac opacity that could represent infection/inflammation versus atelectasis. Electronically Signed   By: Iven Finn M.D.   On: 06/30/2020 20:18   CT RENAL STONE STUDY  Result Date: 06/30/2020 CLINICAL DATA:  Patient reports pain in bilateral feet for gout. Patient reports he was started on medication and that is has started to get worse again. Hydronephrosis. EXAM: CT ABDOMEN AND PELVIS WITHOUT CONTRAST TECHNIQUE: Multidetector CT imaging of the abdomen and pelvis was performed following the standard protocol without IV contrast. COMPARISON:  US renal 06/30/20. FINDINGS: Lower chest: Bilateral lower lobes atelectasis. no acute abnormality. Hepatobiliary: No focal liver abnormality is seen. Calcified gallstones within the gallbladder lumen. No gallbladder wall thickening or biliary dilatation. Pancreas: Unremarkable. No pancreatic ductal dilatation or surrounding inflammatory changes. Spleen: Normal in size without focal abnormality. Adrenals/Urinary Tract: No adrenal nodule bilaterally. Bilateral perinephric and periureteral fat stranding. subcentimeter hypodensity. No nephrolithiasis, no hydronephrosis, and otherwise no contour-deforming renal mass. No ureterolithiasis. Bilateral moderate  hydroureter. The urinary bladder wall thickening and perivascular fat stranding. The foley catheter terminates in the urinary bladder. Stomach/Bowel: Stomach is within normal limits. Appendix appears normal. No evidence of bowel wall thickening, distention, or inflammatory changes. Vascular/Lymphatic: No significant vascular findings are present. No enlarged abdominal or pelvic lymph nodes. Reproductive: The prostate is enlarged measuring up to 5.4cm. Other: No abdominal wall hernia or abnormality. No abdominopelvic ascites. Musculoskeletal: No acute or significant osseous findings. IMPRESSION: 1. Bilateral moderate hydroureter, bilateral perinephrenic/periureteral fat stranding, and perivesicular fat stranding. No radiopaque ureteronephrolithiasis. Findings likely represents infection. Underlying intraluminal lesion within the collecting systems or non-radiopaque stones cannot be excluded. 2. Cholelithiasis. Electronically Signed   By: Iven Finn M.D.   On: 06/30/2020 23:39      Daiquan Resnik T. [place]  If 7PM-7AM, please contact night-coverage [email or web address] 07/01/2020, 12:19 PM

## 2020-07-01 NOTE — Progress Notes (Signed)
PT Cancellation Note  Patient Details Name: Sergio [name] MRN: [id number] DOB: 11-14-1932   Cancelled Treatment:    Reason Eval/Treat Not Completed: Other (comment) Pt up in recliner by RN and not ready to get back to bed.  RN also reports LE dopplers ordered.  Will check back as schedule permits.    Mckensey Berghuis,KATHrine E 07/01/2020, 12:23 PM Jannette Spanner PT, DPT Acute Rehabilitation Services Pager: [phone] Office: [phone]

## 2020-07-01 NOTE — TOC Progression Note (Signed)
Transition of Care [facility]) - Progression Note    Patient Details  Name: Sergio [name] MRN: [id number] Date of Birth: 1933/02/22  [facility] of [facility] CM/SW Contact  Purcell Mouton, RN Phone Number: 07/01/2020, 12:19 PM  Clinical Narrative:    Pt from home with family. PT to eval. TOC will continue to follow.  Expected Discharge Plan: Home/Self Care Barriers to Discharge: No Barriers Identified  Expected Discharge Plan and Services Expected Discharge Plan: Home/Self Care       Living arrangements for the past 2 months: Single Family Home                                       Social Determinants of Health (SDOH) Interventions    Readmission Risk Interventions No flowsheet data found.

## 2020-07-01 NOTE — Progress Notes (Signed)
ANTICOAGULATION CONSULT NOTE - Initial Consult  Pharmacy Consult for Heparin Indication: DVT  No Known Allergies  Patient Measurements:   Heparin Dosing Weight: 61kg  Vital Signs: Temp: 98.4 F (36.9 C) (10/04 1021) Temp Source: Oral (10/04 1021) BP: 150/74 (10/04 1021) Pulse Rate: 92 (10/04 1021)  Labs: Recent Labs    06/30/20 1604 06/30/20 2053 07/01/20 0525  HGB 11.3*  --  10.2*  HCT 33.0*  --  29.2*  PLT 250  --  236  LABPROT  --  17.1*  --   INR  --  1.4*  --   CREATININE 10.65*  --  7.01*  CKTOTAL  --  200  --     CrCl cannot be calculated (Unknown ideal weight.).   Medical History: Past Medical History:  Diagnosis Date  . Colon polyps 06/22/2014   4 colon polyps by colonoscopy.  Repeat 3 years.  Hung.  . Diabetes mellitus   . Hypertension   . Other and unspecified hyperlipidemia   . PE (pulmonary embolism)     Medications:  Infusions:  . sodium chloride 100 mL/hr at 07/01/20 1303  . azithromycin Stopped (07/01/20 0108)  . cefTRIAXone (ROCEPHIN)  IV Stopped (06/30/20 2321)    Assessment: 86 yo M who presented with BLE pain and swelling.  He was recently started on gout medication but symptoms has continued to worsen.  Dopplers + RLE DVT.   He has a documented history of PE but is not currently on anticoagulation.  Baseline labs: CBC- Hg slightly low at 10.2, pltc WNL.  INR 1.4. No bleeding noted.   Goal of Therapy:  Heparin level 0.3-0.7 units/ml Monitor platelets by anticoagulation protocol: Yes   Plan:  Heparin 2000 units IV bolus x1 followed by heparin infusion at 1000 units/hr Check 8h heparin level after heparin initiated Daily heparin level & CBC while on heparin F/U long-term anticoagulation plans  Netta Cedars PharmD, BCPS 07/01/2020,3:14 PM

## 2020-07-01 NOTE — Progress Notes (Signed)
[facility] Progress Note  Subjective: 1 L UOP yest and 3.8 L UOP today so far, creat down today 10.6 > 7.01.   Vitals:   07/01/20 0131 07/01/20 0611 07/01/20 1021 07/01/20 1529  BP: (!) 184/96 130/62 (!) 150/74 (!) 171/75  Pulse: 89 87 92 95  Resp: _0 Temp: 98 F (36.7 C) 98.6 F (37 C) 98.4 F (36.9 C) 98.2 F (36.8 C)  TempSrc:  Oral Oral Oral  SpO2: (!) 86% 99% 100% 97%    Exam: GEN NAD, lying in bed, somnolent, arousable HEENT sclerae anicteric NECK 2-3 cm JVD 30 degrees PULM normal WOB, faint expiratory wheezes CV RRR loud S2, loudest at LUSB ABD soft, no suprapubic tenderness but firmness, ? Bladder edge at umbilicus EXT bilateral 2+ LE edema,  R > L, R dorsum of foot more tender and darker in color than L dorsum of foot NEURO somnolent, some rare twitches but no frank asterixis SKIN no open wounds   Renal US > 10.3/ 10.3 cm kidneys, mild L and mod R hydro+   Today K 3.5 Na 140 BUN 112  Cr 7.01   BP's high to normal    Assessment/ Plan: 1. AoCKD 3b - b/l creat 1.8 in April 2021, eGFR 38 ml/min. Admit creat here 10.65 on 10/3 >> down to 7.01 today.  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 Renal US showed R > L hydro yest. UA negative. UNa 12, UCr 144. Has edema both legs, will hold IVF"s for now. F/u labs in am. Hopefully will have full recovery.  2. HTN -  Holding hctz and acei, can use norvasc for now 3. DM - hold metformin w/ low egfr 4. AMS - infection/ uremia/ meds , follow 5. R foot pain - ^wbc, hx gout.      Rob Afomia Blackley 07/01/2020, 4:36 PM   Recent Labs  Lab 06/30/20 1604 07/01/20 0525  K 4.6 3.5  BUN 106* 112*  CREATININE 10.65* 7.01*  CALCIUM 8.6* 8.5*  PHOS  --  6.1*  HGB 11.3* 10.2*   Inpatient medications: . acetaminophen  500 mg Oral Q8H  . Chlorhexidine Gluconate Cloth  6 each Topical Daily  . docusate sodium  100 mg Oral BID  . insulin  aspart  0-9 Units Subcutaneous Q4H  . predniSONE  30 mg Oral Q breakfast  . sodium chloride flush  3 mL Intravenous Q12H   . sodium chloride 100 mL/hr at 07/01/20 1303  . azithromycin Stopped (07/01/20 0108)  . cefTRIAXone (ROCEPHIN)  IV Stopped (06/30/20 2321)  . heparin 1,000 Units/hr (07/01/20 1612)   hydrALAZINE, ondansetron **OR** ondansetron (ZOFRAN) IV, oxyCODONE, senna-docusate

## 2020-07-02 DIAGNOSIS — G9341 Metabolic encephalopathy: Secondary | ICD-10-CM

## 2020-07-02 LAB — RENAL FUNCTION PANEL
Albumin: 2.2 g/dL — ABNORMAL LOW (ref 3.5–5.0)
Anion gap: 13 (ref 5–15)
BUN: 84 mg/dL — ABNORMAL HIGH (ref 8–23)
CO2: 23 mmol/L (ref 22–32)
Calcium: 8.7 mg/dL — ABNORMAL LOW (ref 8.9–10.3)
Chloride: 108 mmol/L (ref 98–111)
Creatinine, Ser: 3.35 mg/dL — ABNORMAL HIGH (ref 0.61–1.24)
GFR calc Af Amer: 18 mL/min — ABNORMAL LOW (ref >60)
GFR calc non Af Amer: 16 mL/min — ABNORMAL LOW (ref >60)
Glucose, Bld: 188 mg/dL — ABNORMAL HIGH (ref 70–99)
Phosphorus: 3.5 mg/dL (ref 2.5–4.6)
Potassium: 3.5 mmol/L (ref 3.5–5.1)
Sodium: 144 mmol/L (ref 135–145)

## 2020-07-02 LAB — CBC
HCT: 29.4 % — ABNORMAL LOW (ref 39.0–52.0)
Hemoglobin: 10.3 g/dL — ABNORMAL LOW (ref 13.0–17.0)
MCH: 28.5 pg (ref 26.0–34.0)
MCHC: 35 g/dL (ref 30.0–36.0)
MCV: 81.4 fL (ref 80.0–100.0)
Platelets: 244 10*3/uL (ref 150–400)
RBC: 3.61 MIL/uL — ABNORMAL LOW (ref 4.22–5.81)
RDW: 14.3 % (ref 11.5–15.5)
WBC: 13.9 10*3/uL — ABNORMAL HIGH (ref 4.0–10.5)
nRBC: 0 % (ref 0.0–0.2)

## 2020-07-02 LAB — GLUCOSE, CAPILLARY
Glucose-Capillary: 108 mg/dL — ABNORMAL HIGH (ref 70–99)
Glucose-Capillary: 111 mg/dL — ABNORMAL HIGH (ref 70–99)
Glucose-Capillary: 133 mg/dL — ABNORMAL HIGH (ref 70–99)
Glucose-Capillary: 136 mg/dL — ABNORMAL HIGH (ref 70–99)
Glucose-Capillary: 166 mg/dL — ABNORMAL HIGH (ref 70–99)
Glucose-Capillary: 196 mg/dL — ABNORMAL HIGH (ref 70–99)
Glucose-Capillary: 248 mg/dL — ABNORMAL HIGH (ref 70–99)

## 2020-07-02 LAB — MAGNESIUM: Magnesium: 2.5 mg/dL — ABNORMAL HIGH (ref 1.7–2.4)

## 2020-07-02 LAB — HEPARIN LEVEL (UNFRACTIONATED)
Heparin Unfractionated: 0.36 IU/mL (ref 0.30–0.70)
Heparin Unfractionated: 0.49 IU/mL (ref 0.30–0.70)

## 2020-07-02 MED ORDER — TAMSULOSIN HCL 0.4 MG PO CAPS
0.4000 mg | ORAL_CAPSULE | Freq: Every day | ORAL | Status: DC
  Administered 2020-07-02 – 2020-07-07 (×6): 0.4 mg via ORAL
  Filled 2020-07-02 (×6): qty 1

## 2020-07-02 MED ORDER — FINASTERIDE 5 MG PO TABS: 5.0000 mg | ORAL_TABLET | Freq: Every day | ORAL | 3 refills | Status: AC

## 2020-07-02 MED ORDER — AMLODIPINE BESYLATE 10 MG PO TABS
10.0000 mg | ORAL_TABLET | Freq: Every day | ORAL | Status: DC
  Administered 2020-07-02 – 2020-07-07 (×6): 10 mg via ORAL
  Filled 2020-07-02 (×6): qty 1

## 2020-07-02 MED ORDER — FINASTERIDE 5 MG PO TABS
5.0000 mg | ORAL_TABLET | Freq: Every day | ORAL | Status: DC
  Administered 2020-07-02 – 2020-07-07 (×6): 5 mg via ORAL
  Filled 2020-07-02 (×6): qty 1

## 2020-07-02 MED ORDER — TAMSULOSIN HCL 0.4 MG PO CAPS: 0.4000 mg | ORAL_CAPSULE | Freq: Every day | ORAL | 3 refills | Status: DC

## 2020-07-02 NOTE — Plan of Care (Signed)
  Problem: Nutrition: Goal: Adequate nutrition will be maintained Outcome: Progressing   Problem: Pain Managment: Goal: General experience of comfort will improve Outcome: Progressing   

## 2020-07-02 NOTE — Progress Notes (Signed)
ANTICOAGULATION CONSULT NOTE - Follow Up Consult  Pharmacy Consult for Heparin Indication: DVT  No Known Allergies  Patient Measurements:  Height 64 inches Heparin Dosing Weight: 61kg  Vital Signs: Temp: 98.4 F (36.9 C) (10/05 0417) Temp Source: Oral (10/05 0417) BP: 167/71 (10/05 0417) Pulse Rate: 79 (10/05 0417)  Labs: Recent Labs    06/30/20 1604 06/30/20 1604 06/30/20 2053 07/01/20 0525 07/02/20 0025 07/02/20 0813  HGB 11.3*   < >  --  10.2* 10.3*  --   HCT 33.0*  --   --  29.2* 29.4*  --   PLT 250  --   --  236 244  --   LABPROT  --   --  17.1*  --   --   --   INR  --   --  1.4*  --   --   --   HEPARINUNFRC  --   --   --   --  0.36 0.49  CREATININE 10.65*  --   --  7.01* 3.35*  --   CKTOTAL  --   --  200  --   --   --    < > = values in this interval not displayed.    CrCl cannot be calculated (Unknown ideal weight.).   Medical History: Past Medical History:  Diagnosis Date  . Colon polyps 06/22/2014   4 colon polyps by colonoscopy.  Repeat 3 years.  Hung.  . Diabetes mellitus   . Hypertension   . Other and unspecified hyperlipidemia   . PE (pulmonary embolism)     Medications:  Infusions:  . sodium chloride Stopped (07/01/20 1704)  . azithromycin 250 mL/hr at 07/02/20 0100  . cefTRIAXone (ROCEPHIN)  IV Stopped (07/01/20 2346)  . heparin 1,000 Units/hr (07/02/20 0100)    Assessment: 86 yo M who presented with BLE pain and swelling.  He was recently started on gout medication but symptoms has continued to worsen.  Dopplers + RLE DVT.  He has a documented history of PE but is not currently on anticoagulation.   07/02/2020  Heparin level 0.49, therapeutic on 1000 units/hr  Hgb 10.3, low but stable; Plts wnl  No bleeding reported per RN  Goal of Therapy:  Heparin level 0.3-0.7 units/ml Monitor platelets by anticoagulation protocol: Yes   Plan:  Continue heparin infusion at 1000 units/hr Daily heparin level & CBC while on heparin F/U  long-term anticoagulation plans  Peggyann Juba, PharmD, BCPS Pharmacy: [phone] 07/02/2020, 10:25 AM

## 2020-07-02 NOTE — Consult Note (Signed)
Reason for Consult: Bilateral Hydronephrosis / Obstructive Uropathy, Urinary Retention, Acute on Chronic Renal Failure, Prostate Screening  Referring Physician: Wendee Beavers MD  Sergio [name] is an 86 y.o. male.   HPI:   1 - Bilateral Hydronephrosis / Obstructive Uropathy - bilateral moderate hydro to distended bladder by [place] and then CT 06/2020 on eval acute renal failure / urinary retention.   2 - Urinary Retention / Enlarged Prostate -   Prostate vol 33mL withotu median lobe by CT ellipsoid calculation 06/2020.   3 - Acute on Chronic Renal Failure - Baseline Cr 1.5's with rise to 10 by ER labs 06/2020 in setting of urinary retention  4 - Prostate Screening - PSA 7.4 2018 at age 83 (age adjusted normal).  PMH sig for DM , PE. Does not get regular primary care.   Today "Sergio [name]" is seen on consultation for above.   Past Medical History:  Diagnosis Date  . Colon polyps 06/22/2014   4 colon polyps by colonoscopy.  Repeat 3 years.  [place]  . Diabetes mellitus   . Hypertension   . Other and unspecified hyperlipidemia   . PE (pulmonary embolism)     Past Surgical History:  Procedure Laterality Date  . COLONOSCOPY W/ POLYPECTOMY  06/22/2014   4 colon polyps; repeat 3 years; [place].    Family History  Problem Relation Age of Onset  . Diabetes Other     Social History:  reports that he quit smoking about 44 years ago. His smoking use included cigars. He quit after 17.00 years of use. He has never used smokeless tobacco. He reports that he does not drink alcohol and does not use drugs.  Allergies: No Known Allergies  Medications: I have reviewed the patient's current medications.  Results for orders placed or performed during the hospital encounter of 06/30/20 (from the past 48 hour(s))  CBC with Differential/Platelet     Status: Abnormal   Collection Time: 06/30/20  4:04 PM  Result Value Ref Range   WBC 17.0 (H) 4.0 - 10.5 K/uL   RBC 4.03 (L) 4.22 - 5.81 MIL/uL   Hemoglobin 11.3  (L) 13.0 - 17.0 g/dL   HCT 33.0 (L) 39 - 52 %   MCV 81.9 80.0 - 100.0 fL   MCH 28.0 26.0 - 34.0 pg   MCHC 34.2 30.0 - 36.0 g/dL   RDW 14.4 11.5 - 15.5 %   Platelets 250 150 - 400 K/uL   nRBC 0.0 0.0 - 0.2 %   Neutrophils Relative % 87 %   Neutro Abs 14.7 (H) 1.7 - 7.7 K/uL   Lymphocytes Relative 6 %   Lymphs Abs 1.0 0.7 - 4.0 K/uL   Monocytes Relative 6 %   Monocytes Absolute 1.0 0 - 1 K/uL   Eosinophils Relative 0 %   Eosinophils Absolute 0.0 0 - 0 K/uL   Basophils Relative 0 %   Basophils Absolute 0.0 0 - 0 K/uL   Immature Granulocytes 1 %   Abs Immature Granulocytes 0.21 (H) 0.00 - 0.07 K/uL    Comment: Performed at [facility], [place] [address] [place], [place] [address]  Basic metabolic panel     Status: Abnormal   Collection Time: 06/30/20  4:04 PM  Result Value Ref Range   Sodium 135 135 - 145 mmol/L   Potassium 4.6 3.5 - 5.1 mmol/L   Chloride 95 (L) 98 - 111 mmol/L   CO2 19 (L) 22 - 32 mmol/L   Glucose, Bld  126 (H) 70 - 99 mg/dL    Comment: Glucose reference range applies only to samples taken after fasting for at least 8 hours.   BUN 106 (H) 8 - 23 mg/dL    Comment: RESULTS CONFIRMED BY MANUAL DILUTION   Creatinine, Ser 10.65 (H) 0.61 - 1.24 mg/dL   Calcium 8.6 (L) 8.9 - 10.3 mg/dL   GFR calc non Af Amer 4 (L) >60 mL/min   GFR calc Af Amer 5 (L) >60 mL/min   Anion gap 21 (H) 5 - 15    Comment: Performed at [facility], [place] [address] [place], [place] [address]  POC CBG, ED     Status: Abnormal   Collection Time: 06/30/20  4:30 PM  Result Value Ref Range   Glucose-Capillary 119 (H) 70 - 99 mg/dL    Comment: Glucose reference range applies only to samples taken after fasting for at least 8 hours.  Culture, blood (Routine X 2) w Reflex to ID Panel     Status: None (Preliminary result)   Collection Time: 06/30/20  4:54 PM   Specimen: BLOOD  Result Value Ref Range   Specimen Description      BLOOD RIGHT ANTECUBITAL Performed  at [facility], [place] [address], [place], [place] [address]    Special Requests      BOTTLES DRAWN AEROBIC AND ANAEROBIC Blood Culture adequate volume Performed at [place] [address] [place], [place] [address]    Culture      NO GROWTH 2 DAYS Performed at [facility], [place] [address], [place], [place] [address]    Report Status PENDING   Lactic acid, plasma     Status: Abnormal   Collection Time: 06/30/20  4:54 PM  Result Value Ref Range   Lactic Acid, Venous 2.0 (HH) 0.5 - 1.9 mmol/L    Comment: CRITICAL RESULT CALLED TO, READ BACK BY AND VERIFIED WITH: Lovell Sheehan [id number] @1821  BY V.WILKINS Performed at [place] [address], [place], [place] [address]   Sedimentation rate     Status: Abnormal   Collection Time: 06/30/20  4:54 PM  Result Value Ref Range   Sed Rate 105 (H) 0 - 16 mm/hr    Comment: Performed at [facility], [place] [address] [place], [place] [address]  Culture, blood (Routine X 2) w Reflex to ID Panel     Status: None (Preliminary result)   Collection Time: 06/30/20  4:59 PM   Specimen: BLOOD  Result Value Ref Range   Specimen Description      BLOOD RIGHT ANTECUBITAL Performed at [facility], [place] [address] [place], [place] [address]    Special Requests      BOTTLES DRAWN AEROBIC AND ANAEROBIC Blood Culture adequate volume Performed at [place] [address], [place], [place] [address]    Culture      NO GROWTH 2 DAYS Performed at [facility], [place] [address] [place], [place] [address]    Report Status PENDING   Respiratory Panel by RT PCR (Flu A&B, Covid) - Nasopharyngeal Swab     Status: None   Collection Time: 06/30/20  8:53 PM   Specimen: Nasopharyngeal Swab  Result Value Ref Range   SARS Coronavirus 2 by RT PCR NEGATIVE NEGATIVE    Comment: (NOTE) SARS-CoV-2 target nucleic acids are NOT DETECTED.  The SARS-CoV-2 RNA is  generally detectable in upper respiratoy specimens  during the acute phase of infection. The lowest concentration of SARS-CoV-2 viral copies this assay can detect is 131 copies/mL. A negative result does not preclude SARS-Cov-2 infection and should not be used as the sole basis for treatment or other patient management decisions. A negative result may occur with  improper specimen collection/handling, submission of specimen other than nasopharyngeal swab, presence of viral mutation(s) within the areas targeted by this assay, and inadequate number of viral copies (<131 copies/mL). A negative result must be combined with clinical observations, patient history, and epidemiological information. The expected result is Negative.  Fact Sheet for Patients:  PinkCheek.be  Fact Sheet for Healthcare Providers:  GravelBags.it  This test is no t yet approved or cleared by the [place] FDA and  has been authorized for detection and/or diagnosis of SARS-CoV-2 by FDA under an Emergency Use Authorization (EUA). This EUA will remain  in effect (meaning this test can be used) for the duration of the COVID-19 declaration under Section 564(b)(1) of the Act, 21 U.S.C. section 360bbb-3(b)(1), unless the authorization is terminated or revoked sooner.     Influenza A by PCR NEGATIVE NEGATIVE   Influenza B by PCR NEGATIVE NEGATIVE    Comment: (NOTE) The Xpert Xpress SARS-CoV-2/FLU/RSV assay is intended as an aid in  the diagnosis of influenza from Nasopharyngeal swab specimens and  should not be used as a sole basis for treatment. Nasal washings and  aspirates are unacceptable for Xpert Xpress SARS-CoV-2/FLU/RSV  testing.  Fact Sheet for Patients: PinkCheek.be  Fact Sheet for Healthcare Providers: GravelBags.it  This test is not yet approved or cleared by the [place] FDA and   has been authorized for detection and/or diagnosis of SARS-CoV-2 by  FDA under an Emergency Use Authorization (EUA). This EUA will remain  in effect (meaning this test can be used) for the duration of the  Covid-19 declaration under Section 564(b)(1) of the Act, 21  U.S.C. section 360bbb-3(b)(1), unless the authorization is  terminated or revoked. Performed at [facility], [place] [address], [place], [place] [address]   Lactic acid, plasma     Status: None   Collection Time: 06/30/20  8:53 PM  Result Value Ref Range   Lactic Acid, Venous 1.3 0.5 - 1.9 mmol/L    Comment: Performed at [facility], [place] [address], [place], [place] [address]  Procalcitonin     Status: None   Collection Time: 06/30/20  8:53 PM  Result Value Ref Range   Procalcitonin 0.64 ng/mL    Comment:        Interpretation: PCT > 0.5 ng/mL and <= 2 ng/mL: Systemic infection (sepsis) is possible, but other conditions are known to elevate PCT as well. (NOTE)       Sepsis PCT Algorithm           Lower Respiratory Tract                                      Infection PCT Algorithm    ----------------------------     ----------------------------         PCT < 0.25 ng/mL                PCT < 0.10 ng/mL          Strongly encourage             Strongly discourage   discontinuation of antibiotics  initiation of antibiotics    ----------------------------     -----------------------------       PCT 0.25 - 0.50 ng/mL            PCT 0.10 - 0.25 ng/mL               OR       >80% decrease in PCT            Discourage initiation of                                            antibiotics      Encourage discontinuation           of antibiotics    ----------------------------     -----------------------------         PCT >= 0.50 ng/mL              PCT 0.26 - 0.50 ng/mL                AND       <80% decrease in PCT             Encourage initiation of                                              antibiotics       Encourage continuation           of antibiotics    ----------------------------     -----------------------------        PCT >= 0.50 ng/mL                  PCT > 0.50 ng/mL               AND         increase in PCT                  Strongly encourage                                      initiation of antibiotics    Strongly encourage escalation           of antibiotics                                     -----------------------------                                           PCT <= 0.25 ng/mL                                                 OR                                        >  80% decrease in PCT                                      Discontinue / Do not initiate                                             antibiotics  Performed at [place] [address] [place], [place] [address]   Protime-INR     Status: Abnormal   Collection Time: 06/30/20  8:53 PM  Result Value Ref Range   Prothrombin Time 17.1 (H) 11.4 - 15.2 seconds   INR 1.4 (H) 0.8 - 1.2    Comment: (NOTE) INR goal varies based on device and disease states. Performed at [facility], [place] [address], [place], [place] [address]   CK     Status: None   Collection Time: 06/30/20  8:53 PM  Result Value Ref Range   Total CK 200 49.0 - 397.0 U/L    Comment: Performed at [facility], [place] [address] [place], [place] [address]  Magnesium     Status: Abnormal   Collection Time: 06/30/20  8:53 PM  Result Value Ref Range   Magnesium 3.0 (H) 1.7 - 2.4 mg/dL    Comment: Performed at [facility], [place] [address], [place], [place] [address]  Hemoglobin A1c     Status: Abnormal   Collection Time: 06/30/20  8:53 PM  Result Value Ref Range   Hgb A1c MFr Bld 6.7 (H) 4.8 - 5.6 %    Comment: REPEATED TO VERIFY (NOTE) Pre diabetes:          5.7%-6.4%  Diabetes:              >6.4%  Glycemic control for   <7.0% adults  with diabetes    Mean Plasma Glucose 145.59 mg/dL    Comment: Performed at [place] [address] [place], [place] [address]  Uric acid     Status: Abnormal   Collection Time: 06/30/20  8:54 PM  Result Value Ref Range   Uric Acid, Serum 16.8 (H) 3.7 - 8.6 mg/dL    Comment: Performed at [facility], [place] [address] [place], [place] [address]  Hepatic function panel     Status: Abnormal   Collection Time: 06/30/20  8:54 PM  Result Value Ref Range   Total Protein 7.0 6.5 - 8.1 g/dL   Albumin 2.5 (L) 3.5 - 5.0 g/dL   AST 20 15 - 41 U/L   ALT 20 0 - 44 U/L   Alkaline Phosphatase 66 38 - 126 U/L   Total Bilirubin 0.8 0.3 - 1.2 mg/dL   Bilirubin, Direct 0.2 0.0 - 0.2 mg/dL   Indirect Bilirubin 0.6 0.3 - 0.9 mg/dL    Comment: Performed at [facility], [place] [address], [place], [place] [address]  Lactic acid, plasma     Status: None   Collection Time: 06/30/20  8:58 PM  Result Value Ref Range   Lactic Acid, Venous 1.5 0.5 - 1.9 mmol/L    Comment: Performed at [facility], [place] [address], [place], [place] [address]  CBG monitoring, ED     Status: Abnormal   Collection Time: 06/30/20  9:20  PM  Result Value Ref Range   Glucose-Capillary 125 (H) 70 - 99 mg/dL    Comment: Glucose reference range applies only to samples taken after fasting for at least 8 hours.  Sodium, urine, random     Status: None   Collection Time: 06/30/20  9:40 PM  Result Value Ref Range   Sodium, Ur 12 mmol/L    Comment: Performed at [facility], [place] [address], [place], [place] [address]  Creatinine, urine, random     Status: None   Collection Time: 06/30/20  9:40 PM  Result Value Ref Range   Creatinine, Urine 143.99 mg/dL    Comment: Performed at [facility], [place] [address], [place], [place] [address]  Urinalysis, Routine w reflex microscopic Urine, Catheterized     Status: None   Collection Time:  06/30/20  9:40 PM  Result Value Ref Range   Color, Urine YELLOW YELLOW   APPearance CLEAR CLEAR   Specific Gravity, Urine 1.011 1.005 - 1.030   pH 5.0 5.0 - 8.0   Glucose, UA NEGATIVE NEGATIVE mg/dL   Hgb urine dipstick NEGATIVE NEGATIVE   Bilirubin Urine NEGATIVE NEGATIVE   Ketones, ur NEGATIVE NEGATIVE mg/dL   Protein, ur NEGATIVE NEGATIVE mg/dL   Nitrite NEGATIVE NEGATIVE   Leukocytes,Ua NEGATIVE NEGATIVE    Comment: Performed at [place] [address] [place], [place] [address]  Strep pneumoniae urinary antigen     Status: None   Collection Time: 06/30/20 10:42 PM  Result Value Ref Range   Strep Pneumo Urinary Antigen NEGATIVE NEGATIVE    Comment:        Infection due to S. pneumoniae cannot be absolutely ruled out since the antigen present may be below the detection limit of the test. Performed at [facility], [address], [place], [place] [address]   CBG monitoring, ED     Status: None   Collection Time: 06/30/20 11:13 PM  Result Value Ref Range   Glucose-Capillary 98 70 - 99 mg/dL    Comment: Glucose reference range applies only to samples taken after fasting for at least 8 hours.  Glucose, capillary     Status: Abnormal   Collection Time: 07/01/20  4:04 AM  Result Value Ref Range   Glucose-Capillary 121 (H) 70 - 99 mg/dL    Comment: Glucose reference range applies only to samples taken after fasting for at least 8 hours.  Prealbumin     Status: Abnormal   Collection Time: 07/01/20  5:25 AM  Result Value Ref Range   Prealbumin 5.5 (L) 18 - 38 mg/dL    Comment: Performed at [facility], [place] [address] [place], [place] [address]  Vitamin B12     Status: None   Collection Time: 07/01/20  5:25 AM  Result Value Ref Range   Vitamin B-12 506 180 - 914 pg/mL    Comment: (NOTE) This assay is not validated for testing neonatal or myeloproliferative syndrome specimens for Vitamin B12 levels. Performed at [facility], [place] [address] [place], [place] [address]   Folate     Status: None   Collection Time: 07/01/20  5:25 AM  Result Value Ref Range   Folate 6.9 >5.9 ng/mL    Comment: Performed at [facility], [place] [address], [place], [place] [address]  Iron and TIBC     Status: Abnormal   Collection Time: 07/01/20  5:25 AM  Result Value Ref  Range   Iron 44 (L) 45 - 182 ug/dL   TIBC 132 (L) 250 - 450 ug/dL   Saturation Ratios 33 17.9 - 39.5 %   UIBC 88 ug/dL    Comment: Performed at [facility], [place] [address] [place], [place] [address]  Ferritin     Status: Abnormal   Collection Time: 07/01/20  5:25 AM  Result Value Ref Range   Ferritin 1,936 (H) 24 - 336 ng/mL    Comment: Performed at [facility], [place] [address] [place], [place] [address]  Reticulocytes     Status: Abnormal   Collection Time: 07/01/20  5:25 AM  Result Value Ref Range   Retic Ct Pct 0.7 0.4 - 3.1 %   RBC. 3.61 (L) 4.22 - 5.81 MIL/uL   Retic Count, Absolute 26.7 19.0 - 186.0 K/uL   Immature Retic Fract 12.8 2.3 - 15.9 %    Comment: Performed at [facility], [place] [address], [place], [place] [address]  Magnesium     Status: Abnormal   Collection Time: 07/01/20  5:25 AM  Result Value Ref Range   Magnesium 2.7 (H) 1.7 - 2.4 mg/dL    Comment: Performed at [facility], [place] [address], [place], [place] [address]  Phosphorus     Status: Abnormal   Collection Time: 07/01/20  5:25 AM  Result Value Ref Range   Phosphorus 6.1 (H) 2.5 - 4.6 mg/dL    Comment: Performed at [facility], [place] [address], [place], [place] [address]  CBC WITH DIFFERENTIAL     Status: Abnormal   Collection Time: 07/01/20  5:25 AM  Result Value Ref Range   WBC 14.8 (H) 4.0 - 10.5 K/uL   RBC 3.60 (L) 4.22 - 5.81 MIL/uL   Hemoglobin 10.2 (L) 13.0 - 17.0 g/dL   HCT 29.2 (L) 39 - 52 %   MCV 81.1 80.0 - 100.0 fL   MCH 28.3  26.0 - 34.0 pg   MCHC 34.9 30.0 - 36.0 g/dL   RDW 14.5 11.5 - 15.5 %   Platelets 236 150 - 400 K/uL   nRBC 0.0 0.0 - 0.2 %   Neutrophils Relative % 93 %   Neutro Abs 13.7 (H) 1.7 - 7.7 K/uL   Lymphocytes Relative 4 %   Lymphs Abs 0.6 (L) 0.7 - 4.0 K/uL   Monocytes Relative 1 %   Monocytes Absolute 0.2 0 - 1 K/uL   Eosinophils Relative 0 %   Eosinophils Absolute 0.0 0 - 0 K/uL   Basophils Relative 0 %   Basophils Absolute 0.0 0 - 0 K/uL   Immature Granulocytes 2 %   Abs Immature Granulocytes 0.33 (H) 0.00 - 0.07 K/uL    Comment: Performed at [facility], [place] [address] [place],  [address]  TSH     Status: None   Collection Time: 07/01/20  5:25 AM  Result Value Ref Range   TSH 1.562 0.350 - 4.500 uIU/mL    Comment: Performed by a 3rd Generation assay with a functional sensitivity of <=0.01 uIU/mL. Performed at [facility], [place] [address] [place],  [address]   Comprehensive metabolic panel     Status: Abnormal   Collection Time: 07/01/20  5:25 AM  Result Value Ref Range   Sodium 140 135 - 145 mmol/L   Potassium 3.5 3.5 - 5.1 mmol/L    Comment: DELTA CHECK NOTED NO VISIBLE HEMOLYSIS  Chloride 104 98 - 111 mmol/L   CO2 18 (L) 22 - 32 mmol/L   Glucose, Bld 127 (H) 70 - 99 mg/dL    Comment: Glucose reference range applies only to samples taken after fasting for at least 8 hours.   BUN 112 (H) 8 - 23 mg/dL    Comment: RESULTS CONFIRMED BY MANUAL DILUTION   Creatinine, Ser 7.01 (H) 0.61 - 1.24 mg/dL    Comment: DELTA CHECK NOTED   Calcium 8.5 (L) 8.9 - 10.3 mg/dL   Total Protein 6.7 6.5 - 8.1 g/dL   Albumin 2.3 (L) 3.5 - 5.0 g/dL   AST 14 (L) 15 - 41 U/L   ALT 19 0 - 44 U/L   Alkaline Phosphatase 54 38 - 126 U/L   Total Bilirubin 0.9 0.3 - 1.2 mg/dL   GFR calc non Af Amer 6 (L) >60 mL/min   GFR calc Af Amer 8 (L) >60 mL/min   Anion gap 18 (H) 5 - 15    Comment: Performed at [facility], [place]  [address], [place], [place] [address]  Glucose, capillary     Status: Abnormal   Collection Time: 07/01/20  7:57 AM  Result Value Ref Range   Glucose-Capillary 118 (H) 70 - 99 mg/dL    Comment: Glucose reference range applies only to samples taken after fasting for at least 8 hours.  Glucose, capillary     Status: Abnormal   Collection Time: 07/01/20 12:06 PM  Result Value Ref Range   Glucose-Capillary 123 (H) 70 - 99 mg/dL    Comment: Glucose reference range applies only to samples taken after fasting for at least 8 hours.  Glucose, capillary     Status: Abnormal   Collection Time: 07/01/20  4:28 PM  Result Value Ref Range   Glucose-Capillary 111 (H) 70 - 99 mg/dL    Comment: Glucose reference range applies only to samples taken after fasting for at least 8 hours.  Glucose, capillary     Status: Abnormal   Collection Time: 07/01/20  8:14 PM  Result Value Ref Range   Glucose-Capillary 170 (H) 70 - 99 mg/dL    Comment: Glucose reference range applies only to samples taken after fasting for at least 8 hours.  Heparin level (unfractionated)     Status: None   Collection Time: 07/02/20 12:25 AM  Result Value Ref Range   Heparin Unfractionated 0.36 0.30 - 0.70 IU/mL    Comment: (NOTE) If heparin results are below expected values, and patient dosage has  been confirmed, suggest follow up testing of antithrombin III levels. Performed at [facility], [place] [address], [place], [place] [address]   CBC     Status: Abnormal   Collection Time: 07/02/20 12:25 AM  Result Value Ref Range   WBC 13.9 (H) 4.0 - 10.5 K/uL   RBC 3.61 (L) 4.22 - 5.81 MIL/uL   Hemoglobin 10.3 (L) 13.0 - 17.0 g/dL   HCT 29.4 (L) 39 - 52 %   MCV 81.4 80.0 - 100.0 fL   MCH 28.5 26.0 - 34.0 pg   MCHC 35.0 30.0 - 36.0 g/dL   RDW 14.3 11.5 - 15.5 %   Platelets 244 150 - 400 K/uL   nRBC 0.0 0.0 - 0.2 %    Comment: Performed at [facility], [place] [address] [place],  [address]   Renal function panel     Status: Abnormal   Collection Time: 07/02/20 12:25 AM  Result Value Ref Range   Sodium 144 135 - 145 mmol/L   Potassium 3.5 3.5 - 5.1 mmol/L   Chloride 108 98 - 111 mmol/L   CO2 23 22 - 32 mmol/L   Glucose, Bld 188 (H) 70 - 99 mg/dL    Comment: Glucose reference range applies only to samples taken after fasting for at least 8 hours.   BUN 84 (H) 8 - 23 mg/dL   Creatinine, Ser 3.35 (H) 0.61 - 1.24 mg/dL    Comment: DELTA CHECK NOTED   Calcium 8.7 (L) 8.9 - 10.3 mg/dL   Phosphorus 3.5 2.5 - 4.6 mg/dL   Albumin 2.2 (L) 3.5 - 5.0 g/dL   GFR calc non Af Amer 16 (L) >60 mL/min   GFR calc Af Amer 18 (L) >60 mL/min   Anion gap 13 5 - 15    Comment: Performed at [facility], [place] [address] [place], [place] [address]  Magnesium     Status: Abnormal   Collection Time: 07/02/20 12:25 AM  Result Value Ref Range   Magnesium 2.5 (H) 1.7 - 2.4 mg/dL    Comment: Performed at [facility], [place] [address], [place], [place] [address]  Glucose, capillary     Status: Abnormal   Collection Time: 07/02/20 12:26 AM  Result Value Ref Range   Glucose-Capillary 196 (H) 70 - 99 mg/dL    Comment: Glucose reference range applies only to samples taken after fasting for at least 8 hours.  Glucose, capillary     Status: Abnormal   Collection Time: 07/02/20  4:19 AM  Result Value Ref Range   Glucose-Capillary 133 (H) 70 - 99 mg/dL    Comment: Glucose reference range applies only to samples taken after fasting for at least 8 hours.  Glucose, capillary     Status: Abnormal   Collection Time: 07/02/20  7:27 AM  Result Value Ref Range   Glucose-Capillary 108 (H) 70 - 99 mg/dL    Comment: Glucose reference range applies only to samples taken after fasting for at least 8 hours.  Heparin level (unfractionated)     Status: None   Collection Time: 07/02/20  8:13 AM  Result Value Ref Range   Heparin Unfractionated 0.49 0.30 - 0.70 IU/mL    Comment:  (NOTE) If heparin results are below expected values, and patient dosage has  been confirmed, suggest follow up testing of antithrombin III levels. Performed at [facility], [place] [address], [place], [place] [address]   Glucose, capillary     Status: Abnormal   Collection Time: 07/02/20 11:46 AM  Result Value Ref Range   Glucose-Capillary 136 (H) 70 - 99 mg/dL    Comment: Glucose reference range applies only to samples taken after fasting for at least 8 hours.    DG Abd 1 View  Result Date: 06/30/2020 CLINICAL DATA:  Constipation EXAM: ABDOMEN - 1 VIEW COMPARISON:  None. FINDINGS: The bowel gas pattern is normal. No radio-opaque calculi or other significant radiographic abnormality are seen. The stool burden appears to be average. IMPRESSION: Negative. Electronically Signed   By: Constance Holster M.D.   On: 06/30/2020 21:58   US RENAL  Result Date: 06/30/2020 CLINICAL DATA:  Acute renal insufficiency. EXAM: RENAL / URINARY TRACT ULTRASOUND COMPLETE COMPARISON:  None. FINDINGS: Right Kidney: Renal measurements: 10.3 cm x 5.2 cm x 7.0 cm = volume: 195.7 mL. Echogenicity within normal limits. Mild to moderate severity right-sided hydronephrosis is seen. No mass is  visualized. Left Kidney: Renal measurements: 10.3 cm x 5.6 cm x 5.3 cm = volume: 109.5 mL. Echogenicity within normal limits. There is mild left-sided hydronephrosis. A 1.7 cm x 1.2 cm x 1.3 cm anechoic structure is seen within the lower pole of the left kidney. No flow is seen within this region on color Doppler evaluation. Bladder: Appears normal for degree of bladder distention. Other: None. IMPRESSION: 1. Small left renal cyst. 2. Mild to moderate severity bilateral hydronephrosis, right greater than left. Electronically Signed   By: Virgina Norfolk M.D.   On: 06/30/2020 21:14   DG Chest Port 1 View  Result Date: 06/30/2020 CLINICAL DATA:  Shortness of breath EXAM: PORTABLE CHEST 1 VIEW COMPARISON:  Chest x-ray  01/21/2020, CT chest 07/28/2006. FINDINGS: The heart size and mediastinal contours are within normal limits. Aortic arch calcifications. Right base versus changes. Interval development of retrocardiac opacity. No pulmonary edema. No pleural effusion. No pneumothorax. No acute osseous abnormality. IMPRESSION: Interval development of retrocardiac opacity that could represent infection/inflammation versus atelectasis. Electronically Signed   By: Iven Finn M.D.   On: 06/30/2020 20:18   CT RENAL STONE STUDY  Result Date: 06/30/2020 CLINICAL DATA:  Patient reports pain in bilateral feet for gout. Patient reports he was started on medication and that is has started to get worse again. Hydronephrosis. EXAM: CT ABDOMEN AND PELVIS WITHOUT CONTRAST TECHNIQUE: Multidetector CT imaging of the abdomen and pelvis was performed following the standard protocol without IV contrast. COMPARISON:  US renal 06/30/20. FINDINGS: Lower chest: Bilateral lower lobes atelectasis. no acute abnormality. Hepatobiliary: No focal liver abnormality is seen. Calcified gallstones within the gallbladder lumen. No gallbladder wall thickening or biliary dilatation. Pancreas: Unremarkable. No pancreatic ductal dilatation or surrounding inflammatory changes. Spleen: Normal in size without focal abnormality. Adrenals/Urinary Tract: No adrenal nodule bilaterally. Bilateral perinephric and periureteral fat stranding. subcentimeter hypodensity. No nephrolithiasis, no hydronephrosis, and otherwise no contour-deforming renal mass. No ureterolithiasis. Bilateral moderate hydroureter. The urinary bladder wall thickening and perivascular fat stranding. The foley catheter terminates in the urinary bladder. Stomach/Bowel: Stomach is within normal limits. Appendix appears normal. No evidence of bowel wall thickening, distention, or inflammatory changes. Vascular/Lymphatic: No significant vascular findings are present. No enlarged abdominal or pelvic lymph  nodes. Reproductive: The prostate is enlarged measuring up to 5.4cm. Other: No abdominal wall hernia or abnormality. No abdominopelvic ascites. Musculoskeletal: No acute or significant osseous findings. IMPRESSION: 1. Bilateral moderate hydroureter, bilateral perinephrenic/periureteral fat stranding, and perivesicular fat stranding. No radiopaque ureteronephrolithiasis. Findings likely represents infection. Underlying intraluminal lesion within the collecting systems or non-radiopaque stones cannot be excluded. 2. Cholelithiasis. Electronically Signed   By: Iven Finn M.D.   On: 06/30/2020 23:39   VAS [place] LOWER EXTREMITY VENOUS (DVT)  Result Date: 07/01/2020  Lower Venous DVTStudy Indications: Swelling, and Pain.  Comparison Study: no prior Performing Technologist: Abram Sander RVS  Examination Guidelines: A complete evaluation includes B-mode imaging, spectral Doppler, color Doppler, and power Doppler as needed of all accessible portions of each vessel. Bilateral testing is considered an integral part of a complete examination. Limited examinations for reoccurring indications may be performed as noted. The reflux portion of the exam is performed with the patient in reverse Trendelenburg.  +---------+---------------+---------+-----------+----------+-----------------+ RIGHT    CompressibilityPhasicitySpontaneityPropertiesThrombus Aging    +---------+---------------+---------+-----------+----------+-----------------+ CFV      None           No       No  Age Indeterminate +---------+---------------+---------+-----------+----------+-----------------+ SFJ      Full                                                           +---------+---------------+---------+-----------+----------+-----------------+ FV Prox  None                                         Age Indeterminate +---------+---------------+---------+-----------+----------+-----------------+ FV Mid   None                                          Age Indeterminate +---------+---------------+---------+-----------+----------+-----------------+ FV DistalNone                                         Age Indeterminate +---------+---------------+---------+-----------+----------+-----------------+ PFV      None                                         Age Indeterminate +---------+---------------+---------+-----------+----------+-----------------+ POP      Full           Yes      Yes                                    +---------+---------------+---------+-----------+----------+-----------------+ PTV      Full                                                           +---------+---------------+---------+-----------+----------+-----------------+ PERO     Full                                                           +---------+---------------+---------+-----------+----------+-----------------+ Attempted to visualize iliac veins- not visualized well.  +---------+---------------+---------+-----------+----------+--------------+ LEFT     CompressibilityPhasicitySpontaneityPropertiesThrombus Aging +---------+---------------+---------+-----------+----------+--------------+ CFV      Full           Yes      Yes                                 +---------+---------------+---------+-----------+----------+--------------+ SFJ      Full                                                        +---------+---------------+---------+-----------+----------+--------------+ FV Prox  Full                                                        +---------+---------------+---------+-----------+----------+--------------+  FV Mid   Full                                                        +---------+---------------+---------+-----------+----------+--------------+ FV DistalFull                                                         +---------+---------------+---------+-----------+----------+--------------+ PFV      Full                                                        +---------+---------------+---------+-----------+----------+--------------+ POP      Full           Yes      Yes                                 +---------+---------------+---------+-----------+----------+--------------+ PTV      Full                                                        +---------+---------------+---------+-----------+----------+--------------+ PERO     Full                                                        +---------+---------------+---------+-----------+----------+--------------+     Summary: RIGHT: - Findings consistent with age indeterminate deep vein thrombosis involving the right common femoral vein, right femoral vein, and right proximal profunda vein. - No cystic structure found in the popliteal fossa.  LEFT: - There is no evidence of deep vein thrombosis in the lower extremity.  - No cystic structure found in the popliteal fossa.  *See table(s) above for measurements and observations. Electronically signed by Ruta Hinds MD on 07/01/2020 at 4:54:53 PM.    Final     Review of Systems  Constitutional: Negative for chills and fatigue.  All other systems reviewed and are negative.  Blood pressure (!) 167/71, pulse 79, temperature 98.4 F (36.9 C), temperature source Oral, resp. rate 18, SpO2 100 %. Physical Exam Vitals reviewed.  HENT:     Head: Normocephalic.     Nose: Nose normal.     Mouth/Throat:     Comments: Poor dentition Eyes:     Pupils: Pupils are equal, round, and reactive to light.  Cardiovascular:     Rate and Rhythm: Normal rate.     Pulses: Normal pulses.  Pulmonary:     Effort: Pulmonary effort is normal.  Abdominal:     General: Abdomen is flat.  Genitourinary:    Comments: Foley in place with non=foul urine.  Musculoskeletal:     Cervical back: Normal range  of motion.   Skin:    General: Skin is warm.  Neurological:     General: No focal deficit present.     Mental Status: He is alert.  Psychiatric:        Mood and Affect: Mood normal.     Assessment/Plan:  1 - Bilateral Hydronephrosis / Obstructive Uropathy -  Due to likely postate source obstruction. Keep current foley now and at discharge, hydro will be lagging indicator of improvement and take several weeks to resolve.   2 - Urinary Retention / Enlarged Prostate -   Begin tamsulosin + finasteride in house and continue at discharge. Will plan for trial of void in Urology office in few weeks. If fails x few, will need likely TURP.   3 - Acute on Chronic Renal Failure - due to outlet obstruction.   4 - Prostate Screening - no indication for further PSA based screening at his age  Alexis Frock 07/02/2020, 12:31 PM

## 2020-07-02 NOTE — TOC Progression Note (Signed)
Transition of Care [facility]) - Progression Note    Patient Details  Name: Sergio [name] MRN: [id number] Date of Birth: 02/09/1933  Transition of Care [facility]) CM/SW Contact  Joaquin Courts, RN Phone Number: 07/02/2020, 1:04 PM  Clinical Narrative:  Phoebe Perch faxed out to area facilities.       Expected Discharge Plan: Home/Self Care Barriers to Discharge: No Barriers Identified  Expected Discharge Plan and Services Expected Discharge Plan: Home/Self Care       Living arrangements for the past 2 months: Single Family Home                                       Social Determinants of Health (SDOH) Interventions    Readmission Risk Interventions No flowsheet data found.

## 2020-07-02 NOTE — Progress Notes (Signed)
[facility] Progress Note  Subjective: 5 L UOP yest and creat down ot 3 today  Vitals:   07/01/20 1529 07/01/20 1847 07/02/20 0417 07/02/20 1336  BP: (!) 171/75 (!) 166/65 (!) 167/71 (!) 155/102  Pulse: 95 98 79 (!) 103  Resp:  18 18 13   Temp: 98.2 F (36.8 C) 98.7 F (37.1 C) 98.4 F (36.9 C) (!) 97.5 F (36.4 C)  TempSrc: Oral Oral Oral Oral  SpO2: 97% 95% 100%     Exam: GEN NAD, lying in bed, somnolent, arousable HEENT sclerae anicteric NECK 2-3 cm JVD 30 degrees PULM normal WOB CV RRR loud S2, loudest at LUSB ABD soft, no suprapubic tenderness  EXT RLE edema 2+ , no LLE edema NEURO alert and awake, O x 3 SKIN no open wounds   Renal US > 10.3/ 10.3 cm kidneys, mild L and mod R hydro+   Assessment/ Plan: 1. AoCKD 3b - b/l creat 1.8 in April 2021, eGFR 38 ml/min. Admit creat here 10.6 > 7.0 > 3.5 today.  Pt has obstructive nephropathy per renal US, due to bladder outlet obstruction most likely. Foley placed and creat down to 3 today.  Foley in place. Will need urology input. UA negative. Resume IVF for another 1-2 days post -obstruction. Pt looking much better.  No new suggestions, will sign off.  2. HTN -  Holding hctz and acei, can use norvasc for now. Would avoid acei/ ARB for 1-2 months given AKI.  3. DM - hold metformin w/ low egfr 4. AMS - resolved 5. R foot pain - ^wbc, hx gout.      Sergio [name] 07/02/2020, 4:38 PM   Recent Labs  Lab 07/01/20 0525 07/02/20 0025  K 3.5 3.5  BUN 112* 84*  CREATININE 7.01* 3.35*  CALCIUM 8.5* 8.7*  PHOS 6.1* 3.5  HGB 10.2* 10.3*   Inpatient medications: . acetaminophen  500 mg Oral Q8H  . amLODipine  10 mg Oral Daily  . Chlorhexidine Gluconate Cloth  6 each Topical Daily  . docusate sodium  100 mg Oral BID  . finasteride  5 mg Oral Daily  . insulin aspart  0-9 Units Subcutaneous Q4H  . predniSONE  30 mg Oral Q breakfast  . sodium chloride flush  3 mL Intravenous Q12H  . tamsulosin  0.4 mg Oral  Daily   . azithromycin 250 mL/hr at 07/02/20 0100  . cefTRIAXone (ROCEPHIN)  IV Stopped (07/01/20 2346)  . heparin 1,000 Units/hr (07/02/20 1515)   hydrALAZINE, ondansetron **OR** ondansetron (ZOFRAN) IV, oxyCODONE, senna-docusate

## 2020-07-02 NOTE — Evaluation (Signed)
Occupational Therapy Evaluation Patient Details Name: Sergio [name] MRN: [id number] DOB: 02-15-1933 Today's Date: 07/02/2020    History of Present Illness 86 yo male admitted with AKI, gout pain, inability to ambulate. Hx of DM, PE, CKD, gout   Clinical Impression   Pt admitted with AKI. Pt currently with functional limitations due to the deficits listed below (see OT Problem List).  Pt will benefit from skilled OT to increase their safety and independence with ADL and functional mobility for ADL to facilitate discharge to venue listed below.      Follow Up Recommendations  SNF    Equipment Recommendations  None recommended by OT    Recommendations for Other Services       Precautions / Restrictions Precautions Precautions: Fall Restrictions Weight Bearing Restrictions: No      Mobility Bed Mobility               General bed mobility comments: oob in recliner  Transfers Overall transfer level: Needs assistance Equipment used: Rolling walker (2 wheeled) Transfers: Sit to/from [facility] Sit to Stand: Min assist Stand pivot transfers: Min assist       General transfer comment: VCs safety, technique, hand placement. Assist to rise, stabilize, control descent.    Balance Overall balance assessment: Needs assistance Sitting-balance support: Bilateral upper extremity supported Sitting balance-Leahy Scale: Fair     Standing balance support: Bilateral upper extremity supported Standing balance-Leahy Scale: Fair                             ADL either performed or assessed with clinical judgement   ADL Overall ADL's : Needs assistance/impaired Eating/Feeding: Set up;Sitting   Grooming: Sitting;Minimal assistance   Upper Body Bathing: Minimal assistance;Sitting   Lower Body Bathing: Maximal assistance;Sit to/from stand;Cueing for sequencing;Cueing for safety   Upper Body Dressing : Set up;Sitting   Lower Body Dressing:  Maximal assistance;Sit to/from stand;Cueing for safety;Cueing for sequencing   Toilet Transfer: Minimal assistance;RW;Cueing for sequencing;Cueing for safety;BSC   Toileting- Clothing Manipulation and Hygiene: Moderate assistance;Sit to/from stand;Cueing for safety;Cueing for sequencing               Vision Patient Visual Report: No change from baseline              Pertinent Vitals/Pain Pain Assessment: No/denies pain Faces Pain Scale: Hurts even more Pain Location: LE/feet, R>L Pain Descriptors / Indicators: Discomfort;Sore;Aching Pain Intervention(s): Limited activity within patient's tolerance;Monitored during session;Repositioned     Hand Dominance     Extremity/Trunk Assessment Upper Extremity Assessment Upper Extremity Assessment: Generalized weakness   Lower Extremity Assessment Lower Extremity Assessment: Generalized weakness   Cervical / Trunk Assessment Cervical / Trunk Assessment: Kyphotic   Communication Communication Communication: No difficulties   Cognition Arousal/Alertness: Awake/alert Behavior During Therapy: WFL for tasks assessed/performed Overall Cognitive Status: No family/caregiver present to determine baseline cognitive functioning Area of Impairment: Memory;Orientation                 Orientation Level: Disoriented to;Situation;Time             General Comments: pt doesn't understand why people can't understand him. he insists that Ron is someone he worked with at [employer].   General Comments               Home Living Family/patient expects to be discharged to:: Private residence Living Arrangements: Children Available Help at Discharge: Family Type of Home:  House       Home Layout: One level     Bathroom Shower/Tub: Teacher, early years/pre: Standard     Home Equipment: None          Prior Functioning/Environment Level of Independence: Independent                 OT Problem List:  Decreased strength;Decreased activity tolerance;Decreased knowledge of precautions;Impaired balance (sitting and/or standing)      OT Treatment/Interventions: Self-care/ADL training;Therapeutic activities;Patient/family education    OT Goals(Current goals can be found in the care plan section) Acute Rehab OT Goals Patient Stated Goal: to get home soon OT Goal Formulation: With patient Time For Goal Achievement: 07/09/20 Potential to Achieve Goals: Good ADL Goals Pt Will Perform Grooming: with modified independence;standing Pt Will Perform Lower Body Dressing: with modified independence;sit to/from stand Pt Will Transfer to Toilet: with modified independence;bedside commode Pt Will Perform Toileting - Clothing Manipulation and hygiene: with modified independence;sit to/from stand  OT Frequency: Min 2X/week   Barriers to D/C: Decreased caregiver support             AM-PAC OT "6 Clicks" Daily Activity     Outcome Measure Help from another person eating meals?: None Help from another person taking care of personal grooming?: A Little Help from another person toileting, which includes using toliet, bedpan, or urinal?: A Little Help from another person bathing (including washing, rinsing, drying)?: A Little Help from another person to put on and taking off regular upper body clothing?: A Little Help from another person to put on and taking off regular lower body clothing?: A Little 6 Click Score: 19   End of Session Equipment Utilized During Treatment: Rolling walker;Gait belt Nurse Communication: Mobility status  Activity Tolerance: Patient tolerated treatment well Patient left: in chair;with call bell/phone within reach;with chair alarm set  OT Visit Diagnosis: Unsteadiness on feet (R26.81);Repeated falls (R29.6);Muscle weakness (generalized) (M62.81)                Time: 8938-1017 OT Time Calculation (min): 16 min Charges:  OT General Charges $OT Visit: 1 Visit OT  Evaluation $OT Eval Moderate Complexity: 1 Mod  Kari Baars, [place] [phone] Office- [phone]     Tamecka Milham, Edwena Felty D 07/02/2020, 1:08 PM

## 2020-07-02 NOTE — Progress Notes (Signed)
   07/02/20 1336  Assess: MEWS Score  Temp (!) 97.5 F (36.4 C)  BP (!) 155/102  Pulse Rate (!) 103  Resp 13  Level of Consciousness Alert  O2 Device Room Air  Assess: MEWS Score  MEWS Temp 0  MEWS Systolic 0  MEWS Pulse 1  MEWS RR 1  MEWS LOC 0  MEWS Score 2  MEWS Score Color Yellow  Assess: if the MEWS score is Yellow or Red  Were vital signs taken at a resting state? Yes  Focused Assessment No change from prior assessment  Early Detection of Sepsis Score *See Row Information* Medium  MEWS guidelines implemented *See Row Information* Yes  Treat  MEWS Interventions Administered scheduled meds/treatments;Other (Comment)  Take Vital Signs  Increase Vital Sign Frequency  Yellow: Q 2hr X 2 then Q 4hr X 2, if remains yellow, continue Q 4hrs  Escalate  MEWS: Escalate Yellow: discuss with charge nurse/RN and consider discussing with provider and RRT (just realized pt yellow mews at 1730)  Notify: Charge Nurse/RN  Name of Charge Nurse/RN Notified Janett Billow  Date Charge Nurse/RN Notified 07/02/20  Time Charge Nurse/RN Notified 1730

## 2020-07-02 NOTE — Progress Notes (Signed)
ANTICOAGULATION CONSULT NOTE - Initial Consult  Pharmacy Consult for Heparin Indication: DVT  No Known Allergies  Patient Measurements:   Heparin Dosing Weight: 61kg  Vital Signs: Temp: 98.7 F (37.1 C) (10/04 1847) Temp Source: Oral (10/04 1847) BP: 166/65 (10/04 1847) Pulse Rate: 98 (10/04 1847)  Labs: Recent Labs    06/30/20 1604 06/30/20 1604 06/30/20 2053 07/01/20 0525 07/02/20 0025  HGB 11.3*   < >  --  10.2* 10.3*  HCT 33.0*  --   --  29.2* 29.4*  PLT 250  --   --  236 244  LABPROT  --   --  17.1*  --   --   INR  --   --  1.4*  --   --   HEPARINUNFRC  --   --   --   --  0.36  CREATININE 10.65*  --   --  7.01* 3.35*  CKTOTAL  --   --  200  --   --    < > = values in this interval not displayed.    CrCl cannot be calculated (Unknown ideal weight.).   Medical History: Past Medical History:  Diagnosis Date  . Colon polyps 06/22/2014   4 colon polyps by colonoscopy.  Repeat 3 years.  Hung.  . Diabetes mellitus   . Hypertension   . Other and unspecified hyperlipidemia   . PE (pulmonary embolism)     Medications:  Infusions:  . sodium chloride Stopped (07/01/20 1704)  . azithromycin 250 mL/hr at 07/02/20 0100  . cefTRIAXone (ROCEPHIN)  IV Stopped (07/01/20 2346)  . heparin 1,000 Units/hr (07/02/20 0100)    Assessment: 86 yo M who presented with BLE pain and swelling.  He was recently started on gout medication but symptoms has continued to worsen.  Dopplers + RLE DVT.   He has a documented history of PE but is not currently on anticoagulation.  Baseline labs: CBC- Hg slightly low at 10.2, pltc WNL.  INR 1.4. No bleeding noted.   07/02/2020 0030 HL=0.36 therapeutic No bleeding noted  Goal of Therapy:  Heparin level 0.3-0.7 units/ml Monitor platelets by anticoagulation protocol: Yes   Plan:  continue heparin infusion at 1000 units/hr Check 8h heparin level  Daily heparin level & CBC while on heparin F/U long-term anticoagulation plans  Dolly Rias RPh 07/02/2020, 1:23 AM

## 2020-07-02 NOTE — NC FL2 (Signed)
[place] LEVEL OF CARE SCREENING TOOL     IDENTIFICATION  Patient Name: Sergio [name] Birthdate: 1933-08-16 Sex: male Admission Date (Current Location): 06/30/2020  [facility] and [place] Number:  Herbalist and Address:  [facility],  [place] [address] [place]      Provider Number: [id number]  Attending Physician Name and Address:  Mercy Riding, MD  Relative Name and Phone Number:       Current Level of Care: Hospital Recommended Level of Care: [place] Prior Approval Number:    Date Approved/Denied:   PASRR Number: [id number]  Discharge Plan: SNF    Current Diagnoses: Patient Active Problem List   Diagnosis Date Noted  . AKI (acute kidney injury) ([place]) 06/30/2020  . Hydronephrosis 06/30/2020  . Pain due to onychomycosis of toenails of both feet 10/13/2019  . Urinary frequency 10/01/2017  . Type 2 diabetes mellitus without complication, without long-term current use of insulin ([place]) 10/01/2017  . Loss of weight 03/24/2017  . Hypoglycemia 03/24/2017  . Iatrogenic hypotension 03/24/2017  . Nonspecific abnormal electrocardiogram (ECG) (EKG) 02/02/2017  . Colon polyps 07/04/2014  . Controlled diabetes mellitus without long-term current use of insulin ([place]) 12/18/2011  . Uncontrolled hypertension   . Other and unspecified hyperlipidemia     Orientation RESPIRATION BLADDER Height & Weight     Self  Normal Continent Weight:   Height:     BEHAVIORAL SYMPTOMS/MOOD NEUROLOGICAL BOWEL NUTRITION STATUS      Incontinent Diet  AMBULATORY STATUS COMMUNICATION OF NEEDS Skin   Extensive Assist   Normal                       Personal Care Assistance Level of Assistance  Bathing, Dressing, Total care Bathing Assistance: Maximum assistance   Dressing Assistance: Maximum assistance Total Care Assistance: Maximum assistance   Functional Limitations Info             SPECIAL CARE FACTORS FREQUENCY   PT (By licensed PT), OT (By licensed OT)     PT Frequency: 5x weekly OT Frequency: 5x weekly            Contractures Contractures Info: Not present    Additional Factors Info  Code Status, Allergies, Insulin Sliding Scale Code Status Info: full Allergies Info: NKDA   Insulin Sliding Scale Info: Novolog 0-9 units       Current Medications (07/02/2020):  This is the current hospital active medication list Current Facility-Administered Medications  Medication Dose Route Frequency Provider Last Rate Last Admin  . 0.9 %  sodium chloride infusion   Intravenous Continuous Mercy Riding, MD   Stopped at 07/01/20 1704  . acetaminophen (TYLENOL) tablet 500 mg  500 mg Oral Q8H Wendee Beavers T, MD   500 mg at 07/02/20 0552  . azithromycin (ZITHROMAX) 500 mg in sodium chloride 0.9 % 250 mL IVPB  500 mg Intravenous Q24H Doutova, Anastassia, MD 250 mL/hr at 07/02/20 0100 Rate Verify at 07/02/20 0100  . cefTRIAXone (ROCEPHIN) 2 g in sodium chloride 0.9 % 100 mL IVPB  2 g Intravenous Q24H Toy Baker, MD   Stopped at 07/01/20 2346  . Chlorhexidine Gluconate Cloth 2 % PADS 6 each  6 each Topical Daily Mercy Riding, MD   6 each at 07/02/20 0903  . docusate sodium (COLACE) capsule 100 mg  100 mg Oral BID Doutova, Anastassia, MD   100 mg at 07/02/20 0900  . finasteride (  PROSCAR) tablet 5 mg  5 mg Oral Daily Alexis Frock, MD      . heparin ADULT infusion 100 units/mL (25000 units/269mL sodium chloride 0.45%)  1,000 Units/hr Intravenous Continuous Thomes Lolling, RPH 10 mL/hr at 07/02/20 0100 1,000 Units/hr at 07/02/20 0100  . hydrALAZINE (APRESOLINE) tablet 25 mg  25 mg Oral Q6H PRN Gonfa, Taye T, MD      . insulin aspart (novoLOG) injection 0-9 Units  0-9 Units Subcutaneous Q4H Toy Baker, MD   1 Units at 07/02/20 1232  . ondansetron (ZOFRAN) tablet 4 mg  4 mg Oral Q6H PRN Toy Baker, MD       Or  . ondansetron (ZOFRAN) injection 4 mg  4 mg Intravenous Q6H PRN  Doutova, Anastassia, MD      . oxyCODONE (Oxy IR/ROXICODONE) immediate release tablet 5 mg  5 mg Oral Q8H PRN Gonfa, Taye T, MD      . predniSONE (DELTASONE) tablet 30 mg  30 mg Oral Q breakfast Gonfa, Taye T, MD   30 mg at 07/02/20 0900  . senna-docusate (Senokot-S) tablet 1 tablet  1 tablet Oral QHS PRN Doutova, Anastassia, MD      . sodium chloride flush (NS) 0.9 % injection 3 mL  3 mL Intravenous Q12H Doutova, Anastassia, MD   3 mL at 07/02/20 0903  . tamsulosin (FLOMAX) capsule 0.4 mg  0.4 mg Oral Daily Alexis Frock, MD         Discharge Medications: Please see discharge summary for a list of discharge medications.  Relevant Imaging Results:  Relevant Lab Results:   Additional Information SSN [id number]  Joaquin Courts, RN

## 2020-07-02 NOTE — Evaluation (Addendum)
Physical Therapy Evaluation Patient Details Name: Sergio [name] MRN: [id number] DOB: 1933-04-20 Today's Date: 07/02/2020   History of Present Illness  86 yo male admitted with AKI, gout pain, inability to ambulate. (+) R LE DVT. Hx of DM, PE, CKD, gout  Clinical Impression  On eval, pt required Min assist for mobility. He walked ~50 feet with a RW. Pt presents with general weakness, decreased activity tolerance, and impaired gait and balance. Pt reports pain in LEs, R worse than L. He is confused. No family present during session. Will plan to follow and progress activity as tolerated.     Follow Up Recommendations Home health PT;Supervision/Assistance - 24 hour    Equipment Recommendations  Rolling walker with 5" wheels    Recommendations for Other Services       Precautions / Restrictions Precautions Precautions: Fall Restrictions Weight Bearing Restrictions: No      Mobility  Bed Mobility               General bed mobility comments: oob in recliner  Transfers Overall transfer level: Needs assistance Equipment used: Rolling walker (2 wheeled) Transfers: Sit to/from Stand Sit to Stand: Min assist         General transfer comment: VCs safety, technique, hand placement. Assist to rise, stabilize, control descent.  Ambulation/Gait Ambulation/Gait assistance: Min assist Gait Distance (Feet): 50 Feet Assistive device: Rolling walker (2 wheeled) Gait Pattern/deviations: Antalgic;Step-through pattern     General Gait Details: Assist to stabilize pt throughout short distance. Cues for safety, technique, proper use of RW.  Stairs            Wheelchair Mobility    Modified Rankin (Stroke Patients Only)       Balance Overall balance assessment: Needs assistance         Standing balance support: Bilateral upper extremity supported Standing balance-Leahy Scale: Poor                               Pertinent Vitals/Pain Pain  Assessment: Faces Faces Pain Scale: Hurts even more Pain Location: LE/feet, R>L Pain Descriptors / Indicators: Discomfort;Sore;Aching Pain Intervention(s): Limited activity within patient's tolerance;Monitored during session;Repositioned    Home Living Family/patient expects to be discharged to:: Private residence Living Arrangements: Children Available Help at Discharge: Family Type of Home: House       Home Layout: One level Home Equipment: None      Prior Function Level of Independence: Independent               Hand Dominance        Extremity/Trunk Assessment   Upper Extremity Assessment Upper Extremity Assessment: Generalized weakness    Lower Extremity Assessment Lower Extremity Assessment: Generalized weakness    Cervical / Trunk Assessment Cervical / Trunk Assessment: Kyphotic  Communication   Communication: No difficulties  Cognition Arousal/Alertness: Awake/alert Behavior During Therapy: WFL for tasks assessed/performed Overall Cognitive Status: No family/caregiver present to determine baseline cognitive functioning Area of Impairment: Memory;Orientation                 Orientation Level: Disoriented to;Situation;Time             General Comments: pt doesn't understand why people can't understand him. he insists that Ron is someone he worked with at [employer].      General Comments      Exercises     Assessment/Plan    PT Assessment Patient needs  continued PT services  PT Problem List Decreased strength;Decreased mobility;Decreased safety awareness;Decreased cognition;Decreased balance;Decreased knowledge of use of DME;Decreased activity tolerance;Pain       PT Treatment Interventions DME instruction;Gait training;Therapeutic activities;Therapeutic exercise;Patient/family education;Balance training;Functional mobility training    PT Goals (Current goals can be found in the Care Plan section)  Acute Rehab PT Goals Patient  Stated Goal: to get home soon PT Goal Formulation: With patient Time For Goal Achievement: 07/16/20 Potential to Achieve Goals: Good    Frequency Min 3X/week   Barriers to discharge        Co-evaluation               AM-PAC PT "6 Clicks" Mobility  Outcome Measure Help needed turning from your back to your side while in a flat bed without using bedrails?: A Little Help needed moving from lying on your back to sitting on the side of a flat bed without using bedrails?: A Little Help needed moving to and from a bed to a chair (including a wheelchair)?: A Little Help needed standing up from a chair using your arms (e.g., wheelchair or bedside chair)?: A Little Help needed to walk in hospital room?: A Little Help needed climbing 3-5 steps with a railing? : A Lot 6 Click Score: 17    End of Session Equipment Utilized During Treatment: Gait belt Activity Tolerance: Patient limited by fatigue;Patient limited by pain Patient left: in chair;with call bell/phone within reach;with chair alarm set   PT Visit Diagnosis: Muscle weakness (generalized) (M62.81);Difficulty in walking, not elsewhere classified (R26.2);Pain Pain - part of body: Leg;Ankle and joints of foot    Time: 7416-3845 PT Time Calculation (min) (ACUTE ONLY): 21 min   Charges:   PT Evaluation $PT Eval Low Complexity: [place], PT Acute Rehabilitation  Office: [phone] Pager: [phone]

## 2020-07-02 NOTE — Progress Notes (Signed)
PROGRESS NOTE  Sergio [name] [id number]:[id number] DOB: [birth year]   PCP: Forrest Moron, MD  Patient is from: Home  DOA: 06/30/2020 LOS: 2  Brief Narrative / Interim history: 86 year old male with history of DM-2, PE, CKD-3B, HTN, gout and possible cognitive impairment presenting with right foot pain and generalized weakness. Had a ED visit on 05/28/2020 for right foot pain. Although there was suspicion for gout at that time, he was not given colchicine due to CKD nor prednisone due to diabetes and discharged on tramadol. He returns with increased right foot pain, generalized weakness and BLE edema.  In ED, hemodynamically stable. WBC 17.0 with left shift. Cr 10.65 (1.8 in 12/2019). BUN 106. Bicarb 19. LA 2.0. Nephrology consulted. Renal ultrasound ordered and concern about mild to moderate bilateral hydronephrosis. CT abdomen and pelvis revealed bilateral moderate hydronephrosis with possible pyelonephritis. Also concern about left lung pneumonia with chest x-ray revealing retrocardiac opacity.  Foley catheter placed.  Started on IV fluid, ceftriaxone and azithromycin and admitted for AKI on CKD-3B.   AKI improved.  Uric acid elevated.  Started on low-dose prednisone. LE [place] positive for age-indeterminate DVT.  Started on IV heparin.  Nephrology following.  Urology consulted about hydronephrosis.    Subjective: Seen and examined earlier this morning.  No major events overnight or this morning.  No complaints but not a reliable historian.  He is alert and awake.  Oriented to self, place and time but very poor insight about why he is in the hospital.  Objective: Vitals:   07/01/20 1529 07/01/20 1847 07/02/20 0417 07/02/20 1336  BP: (!) 171/75 (!) 166/65 (!) 167/71 (!) 155/102  Pulse: 95 98 79 (!) 103  Resp:  18 18 13   Temp: 98.2 F (36.8 C) 98.7 F (37.1 C) 98.4 F (36.9 C) (!) 97.5 F (36.4 C)  TempSrc: Oral Oral Oral Oral  SpO2: 97% 95% 100%     Intake/Output Summary (Last 24 hours)  at 07/02/2020 1457 Last data filed at 07/02/2020 0932 Gross per 24 hour  Intake 2192.69 ml  Output 4195 ml  Net -2002.31 ml   There were no vitals filed for this visit.  Examination: GENERAL: No apparent distress.  Nontoxic. HEENT: MMM.  Vision and hearing grossly intact.  NECK: Supple.  No apparent JVD.  RESP: On room air.  No IWOB.  Fair aeration bilaterally. CVS:  RRR. Heart sounds normal.  ABD/GI/GU: BS+. Abd soft, NTND.  Indwelling Foley in place. MSK/EXT:  Moves extremities.  1+ pitting edema in RLE.  Trace edema in LLE.  Slight tenderness about the joint line in right knee.  SKIN: no apparent skin lesion or wound NEURO: Awake, alert and oriented appropriately but poor insight.  No apparent focal neuro deficit. PSYCH: Calm. Normal affect.  Procedures:  None  Microbiology summarized: COVID-19 PCR negative. Influenza PCR negative. Blood cultures NGTD.  Assessment & Plan: AKI on CKD-3B/azotemia-suspect this to be due to obstructive etiology/hydronephrosis. Also on HCTZ and lisinopril which could contribute.  AKI and azotemia improved.  About 5 L UOP/24 hours.  Net -6.3L. -B/l Cr ~1.8> 10.65>>> 3.35. BUN 106>>84 -Appreciate guidance by nephrology -Hold nephrotoxic meds -Continue indwelling Foley catheter -Urology, Dr. Tresa Moore consulted and started Proscar and Flomax  Moderate bilateral hydronephrosis: No mention of nephrolithiasis. History of BPH? Vesicoureteral reflux? -Continue indwelling Foley -Flomax and Proscar -Follow urology recs  Acute metabolic encephalopathy/possible dementia-due to uremia?  Improved.  Oriented x4 but very poor insight. -Reorientation and delirium precautions  Uncontrolled hypertension: BP improved. -  Start amlodipine 10 mg daily -P.o. hydralazine as needed with parameters. -Hold home lisinopril and HCTZ in the setting of AKI  Controlled DM-2 with hyperglycemia: A1c 6.7%. Recent Labs  Lab 07/01/20 2014 07/02/20 0026 07/02/20 0419  07/02/20 0727 07/02/20 1146  GLUCAP 170* 196* 133* 108* 136*  -Continue SSI-thin -CBG monitoring   RLE DVT: LE US revealed age-indeterminate DVT.  History of PE?  Does not seem to be on anticoagulation at home -IV heparin for now. Could change to DOAC if renal function improves  RLE pain due to gout flareup?  DVT?: Patient is poor historian and has difficulty localizing pain between his ankle and knee.  Some tenderness along the joint line in his right knee. No tenderness in the right ankle.  -Manage DVT as above -Low-dose prednisone given elevated uric acid and history of gout -Scheduled Tylenol with as needed oxycodone for breakthrough pain  Abnormal chest x-ray: some suspicion for pneumonia but no respiratory symptoms. Has leukocytosis and elevated procalcitonin -Continue ceftriaxone and azithromycin empirically while in-house  Debility/generalized weakness -PT/OT eval-recommended SNF.   Normocytic anemia: Baseline Hgb 13-15> 11.3 (admit)> 10.2> 10.3 -Continue monitoring.  There is no height or weight on file to calculate BMI.         DVT prophylaxis:  SCDs Start: 06/30/20 2304  Code Status: Full code Family Communication: Attempted to call patient's son on 10/4 and 10/5 but no answer. He has no Surveyor, mining.  Status is: Inpatient  Remains inpatient appropriate because:Unsafe d/c plan, IV treatments appropriate due to intensity of illness or inability to take PO and Inpatient level of care appropriate due to severity of illness   Dispo: The patient is from: Home              Anticipated d/c is to: SNF              Anticipated d/c date is: 2 days              Patient currently is not medically stable to d/c.       Consultants:  Nephrology Urology   Sch Meds:  Scheduled Meds: . acetaminophen  500 mg Oral Q8H  . Chlorhexidine Gluconate Cloth  6 each Topical Daily  . docusate sodium  100 mg Oral BID  . finasteride  5 mg Oral Daily  . insulin aspart   0-9 Units Subcutaneous Q4H  . predniSONE  30 mg Oral Q breakfast  . sodium chloride flush  3 mL Intravenous Q12H  . tamsulosin  0.4 mg Oral Daily   Continuous Infusions: . azithromycin 250 mL/hr at 07/02/20 0100  . cefTRIAXone (ROCEPHIN)  IV Stopped (07/01/20 2346)  . heparin 1,000 Units/hr (07/02/20 0100)   PRN Meds:.hydrALAZINE, ondansetron **OR** ondansetron (ZOFRAN) IV, oxyCODONE, senna-docusate  Antimicrobials: Anti-infectives (From admission, onward)   Start     Dose/Rate Route Frequency Ordered Stop   06/30/20 2300  cefTRIAXone (ROCEPHIN) 2 g in sodium chloride 0.9 % 100 mL IVPB        2 g 200 mL/hr over 30 Minutes Intravenous Every 24 hours 06/30/20 2242 07/05/20 2259   06/30/20 2300  azithromycin (ZITHROMAX) 500 mg in sodium chloride 0.9 % 250 mL IVPB        500 mg 250 mL/hr over 60 Minutes Intravenous Every 24 hours 06/30/20 2242 07/05/20 2259       I have personally reviewed the following labs and images: CBC: Recent Labs  Lab 06/30/20 1604 07/01/20 0525 07/02/20 0025  WBC 17.0*  14.8* 13.9*  NEUTROABS 14.7* 13.7*  --   HGB 11.3* 10.2* 10.3*  HCT 33.0* 29.2* 29.4*  MCV 81.9 81.1 81.4  PLT 250 236 244   BMP &GFR Recent Labs  Lab 06/30/20 1604 06/30/20 2053 07/01/20 0525 07/02/20 0025  NA 135  --  140 144  K 4.6  --  3.5 3.5  CL 95*  --  104 108  CO2 19*  --  18* 23  GLUCOSE 126*  --  127* 188*  BUN 106*  --  112* 84*  CREATININE 10.65*  --  7.01* 3.35*  CALCIUM 8.6*  --  8.5* 8.7*  MG  --  3.0* 2.7* 2.5*  PHOS  --   --  6.1* 3.5   CrCl cannot be calculated (Unknown ideal weight.). Liver & Pancreas: Recent Labs  Lab 06/30/20 2054 07/01/20 0525 07/02/20 0025  AST 20 14*  --   ALT 20 19  --   ALKPHOS 66 54  --   BILITOT 0.8 0.9  --   PROT 7.0 6.7  --   ALBUMIN 2.5* 2.3* 2.2*   No results for input(s): LIPASE, AMYLASE in the last 168 hours. No results for input(s): AMMONIA in the last 168 hours. Diabetic: Recent Labs    06/30/20 2053   HGBA1C 6.7*   Recent Labs  Lab 07/01/20 2014 07/02/20 0026 07/02/20 0419 07/02/20 0727 07/02/20 1146  GLUCAP 170* 196* 133* 108* 136*   Cardiac Enzymes: Recent Labs  Lab 06/30/20 2053  CKTOTAL 200   No results for input(s): PROBNP in the last 8760 hours. Coagulation Profile: Recent Labs  Lab 06/30/20 2053  INR 1.4*   Thyroid Function Tests: Recent Labs    07/01/20 0525  TSH 1.562   Lipid Profile: No results for input(s): CHOL, HDL, LDLCALC, TRIG, CHOLHDL, LDLDIRECT in the last 72 hours. Anemia Panel: Recent Labs    07/01/20 0525  VITAMINB12 506  FOLATE 6.9  FERRITIN 1,936*  TIBC 132*  IRON 44*  RETICCTPCT 0.7   Urine analysis:    Component Value Date/Time   COLORURINE YELLOW 06/30/2020 2140   APPEARANCEUR CLEAR 06/30/2020 2140   LABSPEC 1.011 06/30/2020 2140   PHURINE 5.0 06/30/2020 2140   GLUCOSEU NEGATIVE 06/30/2020 2140   HGBUR NEGATIVE 06/30/2020 2140   BILIRUBINUR NEGATIVE 06/30/2020 2140   BILIRUBINUR negative 10/01/2017 0901   BILIRUBINUR neg 04/02/2014 1605   KETONESUR NEGATIVE 06/30/2020 2140   PROTEINUR NEGATIVE 06/30/2020 2140   UROBILINOGEN 0.2 10/01/2017 0901   UROBILINOGEN 0.2 02/12/2015 2205   NITRITE NEGATIVE 06/30/2020 2140   LEUKOCYTESUR NEGATIVE 06/30/2020 2140   Sepsis Labs: Invalid input(s): PROCALCITONIN, [place]  Microbiology: Recent Results (from the past 240 hour(s))  Culture, blood (Routine X 2) w Reflex to ID Panel     Status: None (Preliminary result)   Collection Time: 06/30/20  4:54 PM   Specimen: BLOOD  Result Value Ref Range Status   Specimen Description   Final    BLOOD RIGHT ANTECUBITAL Performed at [facility], [place] [address] [place], [place] [address]    Special Requests   Final    BOTTLES DRAWN AEROBIC AND ANAEROBIC Blood Culture adequate volume Performed at [place] [address], [place], [place] [address]    Culture   Final    NO GROWTH 2 DAYS Performed at  [place] [address] [place], [place] [address]    Report Status PENDING  Incomplete  Culture, blood (Routine X 2) w  Reflex to ID Panel     Status: None (Preliminary result)   Collection Time: 06/30/20  4:59 PM   Specimen: BLOOD  Result Value Ref Range Status   Specimen Description   Final    BLOOD RIGHT ANTECUBITAL Performed at [facility], [place] [address], [place], [place] [address]    Special Requests   Final    BOTTLES DRAWN AEROBIC AND ANAEROBIC Blood Culture adequate volume Performed at [place] [address], [place], [place] [address]    Culture   Final    NO GROWTH 2 DAYS Performed at [place] [address] [place], [place] [address]    Report Status PENDING  Incomplete  Respiratory Panel by RT PCR (Flu A&B, Covid) - Nasopharyngeal Swab     Status: None   Collection Time: 06/30/20  8:53 PM   Specimen: Nasopharyngeal Swab  Result Value Ref Range Status   SARS Coronavirus 2 by RT PCR NEGATIVE NEGATIVE Final    Comment: (NOTE) SARS-CoV-2 target nucleic acids are NOT DETECTED.  The SARS-CoV-2 RNA is generally detectable in upper respiratoy specimens during the acute phase of infection. The lowest concentration of SARS-CoV-2 viral copies this assay can detect is 131 copies/mL. A negative result does not preclude SARS-Cov-2 infection and should not be used as the sole basis for treatment or other patient management decisions. A negative result may occur with  improper specimen collection/handling, submission of specimen other than nasopharyngeal swab, presence of viral mutation(s) within the areas targeted by this assay, and inadequate number of viral copies (<131 copies/mL). A negative result must be combined with clinical observations, patient history, and epidemiological information. The expected result is Negative.  Fact Sheet for Patients:  PinkCheek.be  Fact Sheet for  Healthcare Providers:  GravelBags.it  This test is no t yet approved or cleared by the [place] FDA and  has been authorized for detection and/or diagnosis of SARS-CoV-2 by FDA under an Emergency Use Authorization (EUA). This EUA will remain  in effect (meaning this test can be used) for the duration of the COVID-19 declaration under Section 564(b)(1) of the Act, 21 U.S.C. section 360bbb-3(b)(1), unless the authorization is terminated or revoked sooner.     Influenza A by PCR NEGATIVE NEGATIVE Final   Influenza B by PCR NEGATIVE NEGATIVE Final    Comment: (NOTE) The Xpert Xpress SARS-CoV-2/FLU/RSV assay is intended as an aid in  the diagnosis of influenza from Nasopharyngeal swab specimens and  should not be used as a sole basis for treatment. Nasal washings and  aspirates are unacceptable for Xpert Xpress SARS-CoV-2/FLU/RSV  testing.  Fact Sheet for Patients: PinkCheek.be  Fact Sheet for Healthcare Providers: GravelBags.it  This test is not yet approved or cleared by the [place] FDA and  has been authorized for detection and/or diagnosis of SARS-CoV-2 by  FDA under an Emergency Use Authorization (EUA). This EUA will remain  in effect (meaning this test can be used) for the duration of the  Covid-19 declaration under Section 564(b)(1) of the Act, 21  U.S.C. section 360bbb-3(b)(1), unless the authorization is  terminated or revoked. Performed at [facility], [place] [address] [place], [place] [address]     Radiology Studies: No results found.    Ivory Maduro T. Llano  If 7PM-7AM, please contact night-coverage [email or web address] 07/02/2020, 2:57 PM

## 2020-07-03 LAB — HEPARIN LEVEL (UNFRACTIONATED): Heparin Unfractionated: 0.56 IU/mL (ref 0.30–0.70)

## 2020-07-03 LAB — CBC
HCT: 29.7 % — ABNORMAL LOW (ref 39.0–52.0)
Hemoglobin: 9.9 g/dL — ABNORMAL LOW (ref 13.0–17.0)
MCH: 28.4 pg (ref 26.0–34.0)
MCHC: 33.3 g/dL (ref 30.0–36.0)
MCV: 85.1 fL (ref 80.0–100.0)
Platelets: 259 10*3/uL (ref 150–400)
RBC: 3.49 MIL/uL — ABNORMAL LOW (ref 4.22–5.81)
RDW: 14.6 % (ref 11.5–15.5)
WBC: 12.4 10*3/uL — ABNORMAL HIGH (ref 4.0–10.5)
nRBC: 0 % (ref 0.0–0.2)

## 2020-07-03 LAB — GLUCOSE, CAPILLARY
Glucose-Capillary: 110 mg/dL — ABNORMAL HIGH (ref 70–99)
Glucose-Capillary: 79 mg/dL (ref 70–99)
Glucose-Capillary: 184 mg/dL — ABNORMAL HIGH (ref 70–99)
Glucose-Capillary: 221 mg/dL — ABNORMAL HIGH (ref 70–99)
Glucose-Capillary: 249 mg/dL — ABNORMAL HIGH (ref 70–99)

## 2020-07-03 LAB — RENAL FUNCTION PANEL
Albumin: 2.2 g/dL — ABNORMAL LOW (ref 3.5–5.0)
Anion gap: 9 (ref 5–15)
BUN: 51 mg/dL — ABNORMAL HIGH (ref 8–23)
CO2: 27 mmol/L (ref 22–32)
Calcium: 8.4 mg/dL — ABNORMAL LOW (ref 8.9–10.3)
Chloride: 106 mmol/L (ref 98–111)
Creatinine, Ser: 1.57 mg/dL — ABNORMAL HIGH (ref 0.61–1.24)
GFR calc non Af Amer: 40 mL/min — ABNORMAL LOW (ref >60)
Glucose, Bld: 107 mg/dL — ABNORMAL HIGH (ref 70–99)
Phosphorus: 2.3 mg/dL — ABNORMAL LOW (ref 2.5–4.6)
Potassium: 2.7 mmol/L — CL (ref 3.5–5.1)
Sodium: 142 mmol/L (ref 135–145)

## 2020-07-03 LAB — MAGNESIUM: Magnesium: 2.1 mg/dL (ref 1.7–2.4)

## 2020-07-03 MED ORDER — POTASSIUM CHLORIDE CRYS ER 20 MEQ PO TBCR
20.0000 meq | EXTENDED_RELEASE_TABLET | Freq: Two times a day (BID) | ORAL | Status: DC
  Administered 2020-07-03 (×2): 20 meq via ORAL
  Filled 2020-07-03 (×3): qty 1

## 2020-07-03 MED ORDER — POTASSIUM CHLORIDE 10 MEQ/100ML IV SOLN
10.0000 meq | INTRAVENOUS | Status: AC
  Administered 2020-07-03 (×2): 10 meq via INTRAVENOUS
  Filled 2020-07-03 (×2): qty 100

## 2020-07-03 MED ORDER — POTASSIUM CHLORIDE CRYS ER 20 MEQ PO TBCR
40.0000 meq | EXTENDED_RELEASE_TABLET | Freq: Once | ORAL | Status: AC
  Administered 2020-07-03: 40 meq via ORAL
  Filled 2020-07-03: qty 2

## 2020-07-03 NOTE — Progress Notes (Signed)
CRITICAL VALUE ALERT  Critical Value:  K+ 2.7  Date & Time Notied:  10/6  0735  Provider Notified: yes Ramesh  Orders Received/Actions taken: See [facility]

## 2020-07-03 NOTE — TOC Progression Note (Addendum)
Transition of Care [facility]) - Progression Note    Patient Details  Name: Sergio [name] MRN: [id number] Date of Birth: March 31, 1933  Transition of [facility]) CM/SW Contact  Purcell Mouton, RN Phone Number: 07/03/2020, 11:24 AM  Clinical Narrative:     Spoke with pt's son Jori Moll after calling several times with no answer.  The number in Epic was not correct, however now entered correctly.  Jori Moll states pt will come home and will need HHPT. Will check with [facility] agencies.   Jori Moll is aware that pt was worked up to [facility] after not being able to contact him.     Expected Discharge Plan: Home/Self Care Barriers to Discharge: No Barriers Identified  Expected Discharge Plan and Services Expected Discharge Plan: Home/Self Care       Living arrangements for the past 2 months: Single Family Home                                       Social Determinants of Health (SDOH) Interventions    Readmission Risk Interventions No flowsheet data found.

## 2020-07-03 NOTE — TOC Progression Note (Signed)
Transition of Care [facility]) - Progression Note    Patient Details  Name: Sergio [name] MRN: [id number] Date of Birth: 06-06-1933  Transition of Care [facility]) CM/SW Contact  Purcell Mouton, RN Phone Number: 07/03/2020, 1:11 PM  Clinical Narrative:     [facility] will follow pt at discharge. Will need HHPT orders.   Expected Discharge Plan: Home/Self Care Barriers to Discharge: No Barriers Identified  Expected Discharge Plan and Services Expected Discharge Plan: Home/Self Care       Living arrangements for the past 2 months: Single Family Home                                       Social Determinants of Health (SDOH) Interventions    Readmission Risk Interventions No flowsheet data found.

## 2020-07-03 NOTE — Progress Notes (Signed)
ANTICOAGULATION CONSULT NOTE - Follow Up Consult  Pharmacy Consult for Heparin Indication: DVT  No Known Allergies  Patient Measurements:  Height 64 inches Heparin Dosing Weight: 61kg  Vital Signs: Temp: 98.1 F (36.7 C) (10/06 0411) Temp Source: Oral (10/06 0411) BP: 147/62 (10/06 0411) Pulse Rate: 75 (10/06 0411)  Labs: Recent Labs    06/30/20 1604 06/30/20 2053 07/01/20 0525 07/01/20 0525 07/02/20 0025 07/02/20 0813 07/03/20 0541  HGB   < >  --  10.2*   < > 10.3*  --  9.9*  HCT   < >  --  29.2*  --  29.4*  --  29.7*  PLT   < >  --  236  --  244  --  259  LABPROT  --  17.1*  --   --   --   --   --   INR  --  1.4*  --   --   --   --   --   HEPARINUNFRC  --   --   --   --  0.36 0.49 0.56  CREATININE   < >  --  7.01*  --  3.35*  --  1.57*  CKTOTAL  --  200  --   --   --   --   --    < > = values in this interval not displayed.    CrCl cannot be calculated (Unknown ideal weight.).   Medical History: Past Medical History:  Diagnosis Date  . Colon polyps 06/22/2014   4 colon polyps by colonoscopy.  Repeat 3 years.  Hung.  . Diabetes mellitus   . Hypertension   . Other and unspecified hyperlipidemia   . PE (pulmonary embolism)     Medications:  Infusions:  . azithromycin 500 mg (07/03/20 0041)  . cefTRIAXone (ROCEPHIN)  IV 2 g (07/02/20 2335)  . heparin 1,000 Units/hr (07/02/20 1515)    Assessment: 86 yo M who presented with BLE pain and swelling.  He was recently started on gout medication but symptoms has continued to worsen.  Dopplers + RLE DVT.  He has a documented history of PE but is not currently on anticoagulation.   07/03/2020  Heparin level continues to be therapeutic on 1000 units/hr  Hgb 9.9 down, plts stable  SCr drastically improved  No bleeding reported per RN  Goal of Therapy:  Heparin level 0.3-0.7 units/ml Monitor platelets by anticoagulation protocol: Yes   Plan:  Continue heparin infusion at 1000 units/hr Daily heparin level  & CBC while on heparin F/U long-term anticoagulation plans  Adrian Saran, PharmD, BCPS [phone] until 3pm 07/03/2020 1:50 PM

## 2020-07-03 NOTE — Care Management Important Message (Signed)
Important Message  Patient Details IM Letter given to the Patient Name: Sergio [name] MRN: [id number] Date of Birth: Apr 26, 1933   Medicare Important Message Given:  Yes     Kerin Salen 07/03/2020, 10:49 AM

## 2020-07-03 NOTE — Progress Notes (Signed)
Physical Therapy Treatment Patient Details Name: Sergio [name] MRN: [id number] DOB: 17-Aug-1933 Today's Date: 07/03/2020    History of Present Illness 86 yo male admitted with AKI, gout pain, inability to ambulate.  Pt also found to have RLE DVT: LE US revealed age-indeterminate DVT. Hx of DM, PE, CKD, gout    PT Comments    Pt assisted with standing and had BM on bed pad.  Pt assisted with transfer to [facility].  Pt fatigued after using BSC and requested return to supine.  Pt provided with pillow for R LE elevation.   Follow Up Recommendations  Supervision/Assistance - 24 hour;SNF (SNF if family cannot provide current assist level)     Equipment Recommendations  Rolling walker with 5" wheels    Recommendations for Other Services       Precautions / Restrictions Precautions Precautions: Fall    Mobility  Bed Mobility Overal bed mobility: Needs Assistance Bed Mobility: Supine to Sit;Sit to Supine     Supine to sit: Min assist Sit to supine: Min assist   General bed mobility comments: assist for trunk upright and LEs onto bed  Transfers Overall transfer level: Needs assistance Equipment used: Rolling walker (2 wheeled) Transfers: Sit to/from [facility] Sit to Stand: Min assist Stand pivot transfers: Min assist       General transfer comment: verbal cues for hand placement and weight shifting, pt with posterior lean upon standing, BM on bed pad so had pt transfer to [facility], pt stood x3 from [facility] and assisted with pericare; pt fatigued quickly and assisted back to bed  Ambulation/Gait                 Stairs             Wheelchair Mobility    Modified Rankin (Stroke Patients Only)       Balance Overall balance assessment: Needs assistance       Postural control: Posterior lean Standing balance support: Bilateral upper extremity supported Standing balance-Leahy Scale: Poor Standing balance comment: posterior lean upon standing,  reliant on UEs                            Cognition Arousal/Alertness: Awake/alert Behavior During Therapy: WFL for tasks assessed/performed Overall Cognitive Status: No family/caregiver present to determine baseline cognitive functioning Area of Impairment: Memory;Orientation                 Orientation Level: Disoriented to;Situation;Time;Place             General Comments: MD into room, pt thought he was a Forensic scientist Comments        Pertinent Vitals/Pain Pain Assessment: Faces Faces Pain Scale: Hurts little more Pain Location: R lower leg Pain Descriptors / Indicators: Grimacing;Sore Pain Intervention(s): Repositioned    Home Living                      Prior Function            PT Goals (current goals can now be found in the care plan section) Progress towards PT goals: Progressing toward goals    Frequency    Min 3X/week      PT Plan Discharge plan needs to be updated    Co-evaluation              AM-PAC PT "6 Clicks" Mobility  Outcome Measure  Help needed turning from your back to your side while in a flat bed without using bedrails?: A Little Help needed moving from lying on your back to sitting on the side of a flat bed without using bedrails?: A Little Help needed moving to and from a bed to a chair (including a wheelchair)?: A Lot Help needed standing up from a chair using your arms (e.g., wheelchair or bedside chair)?: A Lot Help needed to walk in hospital room?: A Lot Help needed climbing 3-5 steps with a railing? : A Lot 6 Click Score: 14    End of Session Equipment Utilized During Treatment: Gait belt Activity Tolerance: Patient limited by fatigue Patient left: in bed;with call bell/phone within reach   PT Visit Diagnosis: Muscle weakness (generalized) (M62.81);Difficulty in walking, not elsewhere classified (R26.2)     Time: 2072-1828 PT Time Calculation (min)  (ACUTE ONLY): 26 min  Charges:  $Therapeutic Activity: 23-37 mins                     Jannette Spanner PT, DPT Acute Rehabilitation Services Pager: [phone] Office: [phone]  York Ram E 07/03/2020, 12:49 PM

## 2020-07-03 NOTE — Progress Notes (Signed)
PROGRESS NOTE    Sergio [name]  GOT:[id number] DOB: January 02, 1933 DOA: 06/30/2020 PCP: Forrest Moron, MD   Chief Complaint  Patient presents with   Gout    Brief Narrative: Per previous attending: 86 year old male with history of DM-2, PE, CKD-3B, HTN, gout and possible cognitive impairment presenting with right foot pain and generalized weakness. Had a ED visit on 05/28/2020 for right foot pain. Although there was suspicion for gout at that time, he was not given colchicine due to CKD nor prednisone due to diabetes and discharged on tramadol. He returns with increased right foot pain, generalized weakness and BLE edema.  In ED, hemodynamically stable. WBC 17.0 with left shift. Cr 10.65 (1.8 in 12/2019). BUN 106. Bicarb 19. LA 2.0. Nephrology consulted. Renal ultrasound ordered and concern about mild to moderate bilateral hydronephrosis. CT abdomen and pelvis revealed bilateral moderate hydronephrosis with possible pyelonephritis. Also concern about left lung pneumonia with chest x-ray revealing retrocardiac opacity.  Foley catheter placed.  Started on IV fluid, ceftriaxone and azithromycin and admitted for AKI on CKD-3B.  Managed with IV fluids, followed by nephrology neurology, AKI nicely improved Patient was admitted and started on low-dose prednisone.  Left upper extremity positive for age indeterminate DVT patient is initiated on IV heparin for anticoagulation  Seen by PT/OT and has recommended skilled nursing facility  Subjective: Seen this morning, mildly confused k v low 2.7 UOP 2850 ml   Assessment & Plan:  AKI on CKD stage IIIb: Baseline creatinine 1.8 on 12/2019, on admit 10.6.  Multifactorial due to obstructive nephropathy order ultrasound due to bladder outlet obstruction most likely and also in the setting of diuretics,acei-which has been discontinued.  Foley in place and good urine output and post obstructive diuresis needing IV fluid replacement also potassium replacement.   Appreciate nephrology input slowly wean off IV fluids as diuresis improves. AKI improved. Recent Labs  Lab 06/30/20 1604 07/01/20 0525 07/02/20 0025 07/03/20 0541  BUN 106* 112* 84* 51*  CREATININE 10.65* 7.01* 3.35* 1.57*   Hypokalemia due to postobstructive diuresis being replaced aggressively this morning.  We will replete  Hydronephrosis moderate/bilateral ?BPH/? vesicoureteral reflux: Appreciate urology input Foley in place continue Flomax Proscar.  Urology advised to keep the Foley at discharge and will plan for voiding trial in the urology office in few weeks  Acute metabolic encephalopathy/possible dementia,?  Uremiam.seems to have poor insight even though oriented.  PT OT supportive care fall precaution.  RLE DVT is indeterminate, history of PE ?Marland Kitchen  On IV heparin transition to [place] in am as AKI improves   RLE pain suspected to be from Acute Gout?/ DVT?:  On prednisone now, had elevated uric acid.  Continue pain management  Abnormal chest x-ray with suspicion for pneumonia but no respiratory symptoms or leukocytosis and elevated procalcitonin.  On ceftriaxone vancomycin.  Normocytic anemia hemoglobin is stable.  Monitor.  Debility/generalized weakness deconditioning continue PT OT will need a skilled nursing/placement on discharge .TOC on consult  Uncontrolled hypertension: Started on amlodipine home HCTZ and ACE inhibitor discontinued.  BP improving.  Type 2 diabetes mellitus without complication, without long-term current use of insulin Recent Labs  Lab 07/02/20 2345 07/03/20 0409 07/03/20 0748 07/03/20 1127 07/03/20 1549  GLUCAP 111* 110* 79 184* 221*   Lab Results  Component Value Date   HGBA1C 6.7 (H) 06/30/2020    Nutrition: Diet Order            Diet renal/carb modified with fluid restriction Diet-HS Snack? Nothing; Fluid  restriction: 1200 mL Fluid; Room service appropriate? Yes; Fluid consistency: Thin  Diet effective now                DVT  prophylaxis: SCDs Start: 06/30/20 2304 Code Status:   Code Status: Full Code  Family Communication: plan of care discussed with patient at bedside.  Discussed with social worker who are working to [employer] patient's son to discuss about SNF placement, previous attending unable to contact the son. I will attempt again  Status is: Inpatient Remains inpatient appropriate because:Inpatient level of care appropriate due to severity of illness, ongoing electrolyte imbalance, unsafe disposition  Dispo: The patient is from: Home              Anticipated d/c is to: SNF              Anticipated d/c date is: 1-2 days. Check COVID later tonight.              Patient currently is not medically stable to d/c.  Consultants: Nephrology, urology Procedures:see note Culture/Microbiology    Component Value Date/Time   SDES  06/30/2020 1659    BLOOD RIGHT ANTECUBITAL Performed at [facility], [place] [address] [place], [place] [address]    SPECREQUEST  06/30/2020 1659    BOTTLES DRAWN AEROBIC AND ANAEROBIC Blood Culture adequate volume Performed at [facility] [address] [place], [place] [address]    CULT  06/30/2020 1659    NO GROWTH 3 DAYS Performed at [facility] [address] [place], [place] [address]    REPTSTATUS PENDING 06/30/2020 1659    Other culture-see note  Medications: Scheduled Meds:  acetaminophen  500 mg Oral Q8H   amLODipine  10 mg Oral Daily   Chlorhexidine Gluconate Cloth  6 each Topical Daily   docusate sodium  100 mg Oral BID   finasteride  5 mg Oral Daily   insulin aspart  0-9 Units Subcutaneous Q4H   potassium chloride  20 mEq Oral BID   predniSONE  30 mg Oral Q breakfast   sodium chloride flush  3 mL Intravenous Q12H   tamsulosin  0.4 mg Oral Daily   Continuous Infusions:  azithromycin 500 mg (07/03/20 0041)   cefTRIAXone (ROCEPHIN)  IV 2 g (07/02/20 2335)   heparin 1,000 Units/hr (07/02/20 1515)     Antimicrobials: Anti-infectives (From admission, onward)   Start     Dose/Rate Route Frequency Ordered Stop   06/30/20 2300  cefTRIAXone (ROCEPHIN) 2 g in sodium chloride 0.9 % 100 mL IVPB        2 g 200 mL/hr over 30 Minutes Intravenous Every 24 hours 06/30/20 2242 07/05/20 2259   06/30/20 2300  azithromycin (ZITHROMAX) 500 mg in sodium chloride 0.9 % 250 mL IVPB        500 mg 250 mL/hr over 60 Minutes Intravenous Every 24 hours 06/30/20 2242 07/05/20 2259     Objective: Vitals: Today's Vitals   07/02/20 2348 07/03/20 0411 07/03/20 0750 07/03/20 1410  BP: 132/62 (!) 147/62  136/73  Pulse: 79 75  83  Resp: 18 20  16   Temp: 98 F (36.7 C) 98.1 F (36.7 C)  98.3 F (36.8 C)  TempSrc: Oral Oral  Oral  SpO2: 100% 98%  100%  Weight:    64.7 kg  Height:    5' (1.524 m)  PainSc:   0-No pain     Intake/Output Summary (Last 24 hours) at 07/03/2020 1614 Last  data filed at 07/03/2020 1200 Gross per 24 hour  Intake 711.74 ml  Output 3100 ml  Net -2388.26 ml   Filed Weights   07/03/20 1410  Weight: 64.7 kg   Weight change:   Intake/Output from previous day: 10/05 0701 - 10/06 0700 In: 720 [P.O.:720] Out: 2850 [Urine:2850] Intake/Output this shift: Total I/O In: 711.7 [P.O.:240; I.V.:40; IV Piggyback:431.7] Out: 700 [Urine:700]  Examination: General exam: AAOx2-3 ,NAD, weak appearing. HEENT:Oral mucosa moist, Ear/Nose WNL grossly,dentition normal. Respiratory system: bilaterally clear,no wheezing or crackles,no use of accessory muscle, non tender. Cardiovascular system: S1 & S2 +, regular, No JVD. Gastrointestinal system: Abdomen soft, NT,ND, BS+. Nervous System:Alert, awake, moving extremities and grossly nonfocal Extremities: mild edema RLE, distal peripheral pulses palpable.  Skin: No rashes,no icterus. MSK: Normal muscle bulk,tone, power  Data Reviewed: I have personally reviewed following labs and imaging studies CBC: Recent Labs  Lab 06/30/20 1604  07/01/20 0525 07/02/20 0025 07/03/20 0541  WBC 17.0* 14.8* 13.9* 12.4*  NEUTROABS 14.7* 13.7*  --   --   HGB 11.3* 10.2* 10.3* 9.9*  HCT 33.0* 29.2* 29.4* 29.7*  MCV 81.9 81.1 81.4 85.1  PLT 250 236 244 474   Basic Metabolic Panel: Recent Labs  Lab 06/30/20 1604 06/30/20 2053 07/01/20 0525 07/02/20 0025 07/03/20 0541  NA 135  --  140 144 142  K 4.6  --  3.5 3.5 2.7*  CL 95*  --  104 108 106  CO2 19*  --  18* 23 27  GLUCOSE 126*  --  127* 188* 107*  BUN 106*  --  112* 84* 51*  CREATININE 10.65*  --  7.01* 3.35* 1.57*  CALCIUM 8.6*  --  8.5* 8.7* 8.4*  MG  --  3.0* 2.7* 2.5* 2.1  PHOS  --   --  6.1* 3.5 2.3*   GFR: Estimated Creatinine Clearance: 27.2 mL/min (A) (by C-G formula based on SCr of 1.57 mg/dL (H)). Liver Function Tests: Recent Labs  Lab 06/30/20 2054 07/01/20 0525 07/02/20 0025 07/03/20 0541  AST 20 14*  --   --   ALT 20 19  --   --   ALKPHOS 66 54  --   --   BILITOT 0.8 0.9  --   --   PROT 7.0 6.7  --   --   ALBUMIN 2.5* 2.3* 2.2* 2.2*   No results for input(s): LIPASE, AMYLASE in the last 168 hours. No results for input(s): AMMONIA in the last 168 hours. Coagulation Profile: Recent Labs  Lab 06/30/20 2053  INR 1.4*   Cardiac Enzymes: Recent Labs  Lab 06/30/20 2053  CKTOTAL 200   BNP (last 3 results) No results for input(s): PROBNP in the last 8760 hours. HbA1C: Recent Labs    06/30/20 2053  HGBA1C 6.7*   CBG: Recent Labs  Lab 07/02/20 2345 07/03/20 0409 07/03/20 0748 07/03/20 1127 07/03/20 1549  GLUCAP 111* 110* 79 184* 221*   Lipid Profile: No results for input(s): CHOL, HDL, LDLCALC, TRIG, CHOLHDL, LDLDIRECT in the last 72 hours. Thyroid Function Tests: Recent Labs    07/01/20 0525  TSH 1.562   Anemia Panel: Recent Labs    07/01/20 0525  VITAMINB12 506  FOLATE 6.9  FERRITIN 1,936*  TIBC 132*  IRON 44*  RETICCTPCT 0.7   Sepsis Labs: Recent Labs  Lab 06/30/20 1654 06/30/20 2053 06/30/20 2058   PROCALCITON  --  0.64  --   LATICACIDVEN 2.0* 1.3 1.5    Recent Results (from the past  240 hour(s))  Culture, blood (Routine X 2) w Reflex to ID Panel     Status: None (Preliminary result)   Collection Time: 06/30/20  4:54 PM   Specimen: BLOOD  Result Value Ref Range Status   Specimen Description   Final    BLOOD RIGHT ANTECUBITAL Performed at [facility], [place] [address], [place], [place] [address]    Special Requests   Final    BOTTLES DRAWN AEROBIC AND ANAEROBIC Blood Culture adequate volume Performed at [place] [address], [place], [place] [address]    Culture   Final    NO GROWTH 3 DAYS Performed at [facility], [place] [address] [place], [place] [address]    Report Status PENDING  Incomplete  Culture, blood (Routine X 2) w Reflex to ID Panel     Status: None (Preliminary result)   Collection Time: 06/30/20  4:59 PM   Specimen: BLOOD  Result Value Ref Range Status   Specimen Description   Final    BLOOD RIGHT ANTECUBITAL Performed at [facility], [place] [address] [place], [place] [address]    Special Requests   Final    BOTTLES DRAWN AEROBIC AND ANAEROBIC Blood Culture adequate volume Performed at [facility] [place] [address] [place], [place] [address]    Culture   Final    NO GROWTH 3 DAYS Performed at [facility], [place] [address], [place],  [address]    Report Status PENDING  Incomplete  Respiratory Panel by RT PCR (Flu A&B, Covid) - Nasopharyngeal Swab     Status: None   Collection Time: 06/30/20  8:53 PM   Specimen: Nasopharyngeal Swab  Result Value Ref Range Status   SARS Coronavirus 2 by RT PCR NEGATIVE NEGATIVE Final    Comment: (NOTE) SARS-CoV-2 target nucleic acids are NOT DETECTED.  The SARS-CoV-2 RNA is generally detectable in upper respiratoy specimens during the acute phase of infection. The lowest concentration of SARS-CoV-2 viral copies this assay can detect is 131  copies/mL. A negative result does not preclude SARS-Cov-2 infection and should not be used as the sole basis for treatment or other patient management decisions. A negative result may occur with  improper specimen collection/handling, submission of specimen other than nasopharyngeal swab, presence of viral mutation(s) within the areas targeted by this assay, and inadequate number of viral copies (<131 copies/mL). A negative result must be combined with clinical observations, patient history, and epidemiological information. The expected result is Negative.  Fact Sheet for Patients:  PinkCheek.be  Fact Sheet for Healthcare Providers:  GravelBags.it  This test is no t yet approved or cleared by the [place] FDA and  has been authorized for detection and/or diagnosis of SARS-CoV-2 by FDA under an Emergency Use Authorization (EUA). This EUA will remain  in effect (meaning this test can be used) for the duration of the COVID-19 declaration under Section 564(b)(1) of the Act, 21 U.S.C. section 360bbb-3(b)(1), unless the authorization is terminated or revoked sooner.     Influenza A by PCR NEGATIVE NEGATIVE Final   Influenza B by PCR NEGATIVE NEGATIVE Final    Comment: (NOTE) The Xpert Xpress SARS-CoV-2/FLU/RSV assay is intended as an aid in  the diagnosis of influenza from Nasopharyngeal swab specimens and  should not be used as a sole basis for treatment. Nasal washings and  aspirates are unacceptable for Xpert Xpress SARS-CoV-2/FLU/RSV  testing.  Fact Sheet for Patients: PinkCheek.be  Fact  Sheet for Healthcare Providers: GravelBags.it  This test is not yet approved or cleared by the [place] and  has been authorized for detection and/or diagnosis of SARS-CoV-2 by  FDA under an Emergency Use Authorization (EUA). This EUA will remain  in effect (meaning  this test can be used) for the duration of the  Covid-19 declaration under Section 564(b)(1) of the Act, 21  U.S.C. section 360bbb-3(b)(1), unless the authorization is  terminated or revoked. Performed at [facility], [place] [address], [place], [place] [address]      Radiology Studies: No results found.   LOS: 3 days   Antonieta Pert, MD [facility]  07/03/2020, 4:14 PM

## 2020-07-04 LAB — RENAL FUNCTION PANEL
Albumin: 2.2 g/dL — ABNORMAL LOW (ref 3.5–5.0)
Anion gap: 10 (ref 5–15)
BUN: 38 mg/dL — ABNORMAL HIGH (ref 8–23)
CO2: 24 mmol/L (ref 22–32)
Calcium: 8.7 mg/dL — ABNORMAL LOW (ref 8.9–10.3)
Chloride: 113 mmol/L — ABNORMAL HIGH (ref 98–111)
Creatinine, Ser: 1.71 mg/dL — ABNORMAL HIGH (ref 0.61–1.24)
GFR calc non Af Amer: 36 mL/min — ABNORMAL LOW (ref >60)
Glucose, Bld: 79 mg/dL (ref 70–99)
Phosphorus: 1.9 mg/dL — ABNORMAL LOW (ref 2.5–4.6)
Potassium: 3.8 mmol/L (ref 3.5–5.1)
Sodium: 147 mmol/L — ABNORMAL HIGH (ref 135–145)

## 2020-07-04 LAB — GLUCOSE, CAPILLARY
Glucose-Capillary: 101 mg/dL — ABNORMAL HIGH (ref 70–99)
Glucose-Capillary: 126 mg/dL — ABNORMAL HIGH (ref 70–99)
Glucose-Capillary: 137 mg/dL — ABNORMAL HIGH (ref 70–99)
Glucose-Capillary: 212 mg/dL — ABNORMAL HIGH (ref 70–99)
Glucose-Capillary: 248 mg/dL — ABNORMAL HIGH (ref 70–99)
Glucose-Capillary: 80 mg/dL (ref 70–99)

## 2020-07-04 LAB — CBC
HCT: 31.1 % — ABNORMAL LOW (ref 39.0–52.0)
Hemoglobin: 10.5 g/dL — ABNORMAL LOW (ref 13.0–17.0)
MCH: 28.7 pg (ref 26.0–34.0)
MCHC: 33.8 g/dL (ref 30.0–36.0)
MCV: 85 fL (ref 80.0–100.0)
Platelets: 192 10*3/uL (ref 150–400)
RBC: 3.66 MIL/uL — ABNORMAL LOW (ref 4.22–5.81)
RDW: 14.6 % (ref 11.5–15.5)
WBC: 13.5 10*3/uL — ABNORMAL HIGH (ref 4.0–10.5)
nRBC: 0.1 % (ref 0.0–0.2)

## 2020-07-04 LAB — HEPARIN LEVEL (UNFRACTIONATED): Heparin Unfractionated: 0.42 IU/mL (ref 0.30–0.70)

## 2020-07-04 MED ORDER — POTASSIUM & SODIUM PHOSPHATES 280-160-250 MG PO PACK
1.0000 | PACK | Freq: Three times a day (TID) | ORAL | Status: DC
  Administered 2020-07-04 – 2020-07-06 (×8): 1 via ORAL
  Filled 2020-07-04 (×8): qty 1

## 2020-07-04 MED ORDER — SODIUM PHOSPHATES 45 MMOLE/15ML IV SOLN: 20.0000 mmol | Freq: Once | INTRAVENOUS | Status: DC

## 2020-07-04 MED ORDER — APIXABAN 5 MG PO TABS: 5.0000 mg | ORAL_TABLET | Freq: Two times a day (BID) | ORAL | Status: DC

## 2020-07-04 MED ORDER — APIXABAN 5 MG PO TABS
10.0000 mg | ORAL_TABLET | Freq: Two times a day (BID) | ORAL | Status: DC
  Administered 2020-07-04 – 2020-07-07 (×7): 10 mg via ORAL
  Filled 2020-07-04 (×7): qty 2

## 2020-07-04 MED ORDER — PREDNISONE 20 MG PO TABS
20.0000 mg | ORAL_TABLET | Freq: Every day | ORAL | Status: DC
  Administered 2020-07-05 – 2020-07-06 (×2): 20 mg via ORAL
  Filled 2020-07-04 (×2): qty 1

## 2020-07-04 MED ORDER — POTASSIUM PHOSPHATES 15 MMOLE/5ML IV SOLN
15.0000 mmol | Freq: Once | INTRAVENOUS | Status: AC
  Administered 2020-07-04: 15 mmol via INTRAVENOUS
  Filled 2020-07-04: qty 5

## 2020-07-04 MED ORDER — POTASSIUM CHLORIDE CRYS ER 20 MEQ PO TBCR
20.0000 meq | EXTENDED_RELEASE_TABLET | Freq: Two times a day (BID) | ORAL | Status: DC
  Administered 2020-07-04 – 2020-07-05 (×3): 20 meq via ORAL
  Filled 2020-07-04 (×3): qty 1

## 2020-07-04 NOTE — Progress Notes (Signed)
Occupational Therapy Progress Note  Patient agreeable to getting out of bed. During bed mobility note linens soiled with stool, when asked if he can tell when he needs to have BM patient replies "not really." Min A for functional transfers and ambulation with rolling walker into bathroom. Cues for safe hand placement not to pull on walker with sit to stands. Total A for peri care. Patient ambulate to recliner, verbal cue to reach back for chair arms to sit. D/C recommendation for SNF however CM notes indicating family wants home, then would recommend [facility].    07/04/20 1400  OT Visit Information  Last OT Received On 07/04/20  Assistance Needed +1  History of Present Illness 86 yo male admitted with AKI, gout pain, inability to ambulate.  Pt also found to have RLE DVT: LE US revealed age-indeterminate DVT. Hx of DM, PE, CKD, gout  Precautions  Precautions Fall  Pain Assessment  Pain Assessment Faces  Faces Pain Scale 0  Cognition  Arousal/Alertness Awake/alert  Behavior During Therapy WFL for tasks assessed/performed  Overall Cognitive Status No family/caregiver present to determine baseline cognitive functioning  General Comments following directions appropriately  ADL  Overall ADL's  Needs assistance/impaired  Toilet Transfer Minimal assistance;RW;Ambulation;Regular Toilet;Grab bars;Cueing for safety  Toilet Transfer Details (indicate cue type and reason) requires cues for hand placement not to pull on walker  Toileting- Clothing Manipulation and Hygiene Total assistance;Sit to/from stand  Toileting - Clothing Manipulation Details (indicate cue type and reason) decreased standing balance, total A for thoroughness of peri care. patient incontinent of stool in bed, reports cannot tell when he has to have BM  Functional mobility during ADLs Minimal assistance;Rolling walker;Cueing for safety;Cueing for sequencing  Bed Mobility  Overal bed mobility Needs Assistance  Bed Mobility Supine to Sit   Supine to sit Min assist  Sit to supine Min assist  General bed mobility comments min A to elevate trunk  Balance  Overall balance assessment Needs assistance  Sitting-balance support Feet supported  Sitting balance-Leahy Scale Fair  Standing balance support Bilateral upper extremity supported  Standing balance-Leahy Scale Poor  Transfers  Overall transfer level Needs assistance  Equipment used Rolling walker (2 wheeled)  Transfers Sit to/from Stand  Sit to Stand Min assist  General transfer comment verbal cues for hand placement patient tries to pull up on walker when standing from EOB and toliet  OT - End of Session  Equipment Utilized During Treatment Rolling walker  Activity Tolerance Patient tolerated treatment well  Patient left in chair;with call bell/phone within reach;with chair alarm set  Nurse Communication Mobility status  OT Assessment/Plan  OT Plan Discharge plan remains appropriate  OT Visit Diagnosis Unsteadiness on feet (R26.81);Repeated falls (R29.6);Muscle weakness (generalized) (M62.81)  OT Frequency (ACUTE ONLY) Min 2X/week  Follow Up Recommendations SNF;Other (comment);Supervision/Assistance - 24 hour ([place] with 24/7 if family declines SNF)  OT Equipment None recommended by OT  AM-PAC OT "6 Clicks" Daily Activity Outcome Measure (Version 2)  Help from another person eating meals? 4  Help from another person taking care of personal grooming? 3  Help from another person toileting, which includes using toliet, bedpan, or urinal? 3  Help from another person bathing (including washing, rinsing, drying)? 3  Help from another person to put on and taking off regular upper body clothing? 3  Help from another person to put on and taking off regular lower body clothing? 3  6 Click Score 19  OT Goal Progression  Progress towards OT goals  Progressing toward goals  Acute Rehab OT Goals  Patient Stated Goal to get home soon  OT Goal Formulation With patient  Time For  Goal Achievement 07/09/20  Potential to Achieve Goals Good  OT Time Calculation  OT Start Time (ACUTE ONLY) 1345  OT Stop Time (ACUTE ONLY) 1403  OT Time Calculation (min) 18 min  OT General Charges  $OT Visit 1 Visit  OT Treatments  $Self Care/Home Management  8-22 mins   Delbert Phenix OT OT pager: [phone]

## 2020-07-04 NOTE — Progress Notes (Signed)
Physical Therapy Treatment Patient Details Name: Sergio [name] MRN: [id number] DOB: Sep 22, 1933 Today's Date: 07/04/2020    History of Present Illness 86 yo male admitted with AKI, gout pain, inability to ambulate.  Pt also found to have RLE DVT: LE US revealed age-indeterminate DVT. Hx of DM, PE, CKD, gout    PT Comments    Pt assisted with ambulating in hallway today. Pt requires assist to rise and steady from recliner and cues for safety.  Pt would benefit from 24/7 assist upon d/c for safety.  If family able to provide min assist, pt could d/c home. However if family feels unable to assist or provide 24/7 assist, recommend SNF.   Follow Up Recommendations  Supervision/Assistance - 24 hour;SNF (SNF if family cannot provide current assist level)     Equipment Recommendations  Rolling walker with 5" wheels    Recommendations for Other Services       Precautions / Restrictions Precautions Precautions: Fall    Mobility  Bed Mobility Overal bed mobility: Needs Assistance Bed Mobility: Supine to Sit     Supine to sit: Min assist Sit to supine: Min assist   General bed mobility comments: pt in recliner  Transfers Overall transfer level: Needs assistance Equipment used: Rolling walker (2 wheeled) Transfers: Sit to/from Stand Sit to Stand: Min assist         General transfer comment: verbal cues for hand placement, posterior lean upon standing requiring assist, assist to rise from chair  Ambulation/Gait Ambulation/Gait assistance: Min guard;Min assist Gait Distance (Feet): 100 Feet Assistive device: Rolling walker (2 wheeled) Gait Pattern/deviations: Step-through pattern;Decreased stride length     General Gait Details: initial assist to stabilze however improved with distance, fatigued end of ambulation   Stairs             Wheelchair Mobility    Modified Rankin (Stroke Patients Only)       Balance Overall balance assessment: Needs  assistance Sitting-balance support: Feet supported Sitting balance-Leahy Scale: Fair     Standing balance support: Bilateral upper extremity supported Standing balance-Leahy Scale: Poor                              Cognition Arousal/Alertness: Awake/alert Behavior During Therapy: WFL for tasks assessed/performed Overall Cognitive Status: No family/caregiver present to determine baseline cognitive functioning                                 General Comments: pt more appropriate today compared to yesterday, following simple commands      Exercises      General Comments        Pertinent Vitals/Pain Pain Assessment: No/denies pain Faces Pain Scale: No hurt Pain Intervention(s): Monitored during session    Home Living                      Prior Function            PT Goals (current goals can now be found in the care plan section) Acute Rehab PT Goals Patient Stated Goal: to get home soon Progress towards PT goals: Progressing toward goals    Frequency    Min 3X/week      PT Plan Current plan remains appropriate    Co-evaluation              AM-PAC PT "6 Clicks"  Mobility   Outcome Measure  Help needed turning from your back to your side while in a flat bed without using bedrails?: A Little Help needed moving from lying on your back to sitting on the side of a flat bed without using bedrails?: A Little Help needed moving to and from a bed to a chair (including a wheelchair)?: A Lot Help needed standing up from a chair using your arms (e.g., wheelchair or bedside chair)?: A Lot Help needed to walk in hospital room?: A Lot Help needed climbing 3-5 steps with a railing? : A Lot 6 Click Score: 14    End of Session Equipment Utilized During Treatment: Gait belt Activity Tolerance: Patient limited by fatigue Patient left: in chair;with call bell/phone within reach;with chair alarm set   PT Visit Diagnosis: Muscle  weakness (generalized) (M62.81);Difficulty in walking, not elsewhere classified (R26.2)     Time: 9444-6190 PT Time Calculation (min) (ACUTE ONLY): 13 min  Charges:  $Gait Training: 8-22 mins                     Arlyce Dice, DPT Acute Rehabilitation Services Pager: [phone] Office: [phone]  York Ram E 07/04/2020, 3:45 PM

## 2020-07-04 NOTE — TOC Benefit Eligibility Note (Signed)
Transition of Care [facility]) Benefit Eligibility Note    Patient Details  Name: Sergio [name] MRN: [id number] Date of Birth: 09/27/1933   Medication/Dose: Eliquis 68m 2 x day for 30 days and Xarelto 257m1 x day for 30 days  Covered?: Yes  Tier: 3 Drug  Prescription Coverage Preferred Pharmacy: local pharmacy  Spoke with Person/Company/Phone Number:: [employer] [phone]-Pay: $45.00 fro Eliquis and Xarelto  Prior Approval: No  Deductible: Met       FuKerin [place]one Number: 07/04/2020, 11:34 AM

## 2020-07-04 NOTE — Progress Notes (Signed)
PROGRESS NOTE    Sergio [name]  JSH:[id number] DOB: Feb 06, 1933 DOA: 06/30/2020 PCP: Forrest Moron, MD   Chief Complaint  Patient presents with  . Gout    Brief Narrative: Per previous attending: 86 year old male with history of DM-2, PE, CKD-3B, HTN, gout and possible cognitive impairment presenting with right foot pain and generalized weakness. Had a ED visit on 05/28/2020 for right foot pain. Although there was suspicion for gout at that time, he was not given colchicine due to CKD nor prednisone due to diabetes and discharged on tramadol. He returns with increased right foot pain, generalized weakness and BLE edema.  In ED, hemodynamically stable. WBC 17.0 with left shift. Cr 10.65 (1.8 in 12/2019). BUN 106. Bicarb 19. LA 2.0. Nephrology consulted. Renal ultrasound ordered and concern about mild to moderate bilateral hydronephrosis. CT abdomen and pelvis revealed bilateral moderate hydronephrosis with possible pyelonephritis. Also concern about left lung pneumonia with chest x-ray revealing retrocardiac opacity.  Foley catheter placed.  Started on IV fluid, ceftriaxone and azithromycin and admitted for AKI on CKD-3B.  Managed with IV fluids, followed by nephrology neurology, AKI nicely improved Patient was admitted and started on low-dose prednisone.  Left upper extremity positive for age indeterminate DVT patient is initiated on IV heparin for anticoagulation  Seen by PT/OT and has recommended skilled nursing facility  Subjective: Afebrile overnight.  No new complaints.  Alert awake. Has been weak and deconditioned. Voiding well on foley   Assessment & Plan:  AKI on CKD stage IIIb: Baseline creatinine 1.8 on 12/2019, on admit 10.6.  Multifactorial due to obstructive nephropathy order ultrasound due to bladder outlet obstruction most likely and also in the setting of diuretics,acei-which has been discontinued.  Foley in place and good urine output and post obstructive diuresis needing  IV fluid replacement also potassium replacement. uop 1950 ml.  Nephrology has signed off.  Creatinine at 1.7 likely his new baseline.  We will continue to monitor him and check BMP in the morning Recent Labs  Lab 06/30/20 1604 07/01/20 0525 07/02/20 0025 07/03/20 0541 07/04/20 0500  BUN 106* 112* 84* 51* 38*  CREATININE 10.65* 7.01* 3.35* 1.57* 1.71*   Hypokalemia resolved with aggressive replacement.  Likely from postobstructive diuresis.    Hypophosphatemia being repleted  Hydronephrosis moderate/bilateral ?BPH/? vesicoureteral reflux: Appreciate urology input Foley in place continue Flomax Proscar.  Urology advised to keep the Foley at discharge and will plan for voiding trial in the urology office in few weeks.  Draining well.  Acute metabolic encephalopathy/possible dementia,?  Uremia: Appears alert awake oriented today,seesm ot have poor insight. Cont ptot fall precautions  RLE DVT is indeterminate, history of PE ?Mar[place] Kitchen  On IV heparin transition to [place] today pm. And monitor overnight.we discussed about risk benefits and alternatives of DOAC.  RLE pain suspected to be from Acute Gout?/ DVT?: pain has improved.  We will start to taper the prednisone. He had elevated uric acid.  Continue pain management  Abnormal chest x-ray with suspicion for pneumonia but no respiratory symptoms or leukocytosis and elevated procalcitonin.  On ceftriaxone azithromycin completing course  10/8.  Normocytic anemia hemoglobin is stable.  Monitor.  Debility/generalized weakness deconditioning continue PT OT will need a skilled nursing/placement on discharge .TOC on consult.  Patient son wants to take him home and has declined skilled nursing facility.  Will order PT OT for home health  Uncontrolled hypertension: Started on amlodipine. home HCTZ and ACE inhibitor discontinued.  Blood pressure is well controlled.  Type 2 diabetes mellitus without complication, without long-term current use of insulin blood  sugar well controlled currently. Recent Labs  Lab 07/03/20 1955 07/04/20 0020 07/04/20 0352 07/04/20 0903 07/04/20 1104  GLUCAP 249* 126* 80 101* 137*   Lab Results  Component Value Date   HGBA1C 6.7 (H) 06/30/2020    Nutrition: Diet Order            Diet renal/carb modified with fluid restriction Diet-HS Snack? Nothing; Fluid restriction: 1200 mL Fluid; Room service appropriate? Yes; Fluid consistency: Thin  Diet effective now                DVT prophylaxis: SCDs Start: 06/30/20 2304 Code Status:   Code Status: Full Code  Family Communication: plan of care discussed with patient at bedside.   Status is: Inpatient Remains inpatient appropriate because:Inpatient level of care appropriate due to severity of illness, ongoing electrolyte imbalance, unsafe disposition  Dispo: The patient is from: Home              Anticipated d/c is to: SNF declined. For [facility]              Anticipated d/c date is: 1 days.              Patient currently is not medically stable to d/c.  Consultants: Nephrology, urology Procedures:see note Culture/Microbiology    Component Value Date/Time   SDES  06/30/2020 1659    BLOOD RIGHT ANTECUBITAL Performed at [facility], [place] [address] [place], [place] [address]    SPECREQUEST  06/30/2020 1659    BOTTLES DRAWN AEROBIC AND ANAEROBIC Blood Culture adequate volume Performed at [facility] [address] [place], [place] [address]    CULT  06/30/2020 1659    NO GROWTH 4 DAYS Performed at [facility] [address] [place], [place] [address]    REPTSTATUS PENDING 06/30/2020 1659    Other culture-see note  Medications: Scheduled Meds: . acetaminophen  500 mg Oral Q8H  . amLODipine  10 mg Oral Daily  . apixaban  10 mg Oral BID   Followed by  . [START ON 07/11/2020] apixaban  5 mg Oral BID  . Chlorhexidine Gluconate Cloth  6 each Topical Daily  . docusate sodium  100 mg Oral BID  . finasteride  5 mg Oral  Daily  . insulin aspart  0-9 Units Subcutaneous Q4H  . potassium & sodium phosphates  1 packet Oral TID WC & HS  . potassium chloride  20 mEq Oral BID  . predniSONE  30 mg Oral Q breakfast  . sodium chloride flush  3 mL Intravenous Q12H  . tamsulosin  0.4 mg Oral Daily   Continuous Infusions: . azithromycin Stopped (07/04/20 0129)  . cefTRIAXone (ROCEPHIN)  IV Stopped (07/03/20 2332)  . potassium PHOSPHATE IVPB (in mmol) 43 mL/hr at 07/04/20 1400    Antimicrobials: Anti-infectives (From admission, onward)   Start     Dose/Rate Route Frequency Ordered Stop   06/30/20 2300  cefTRIAXone (ROCEPHIN) 2 g in sodium chloride 0.9 % 100 mL IVPB        2 g 200 mL/hr over 30 Minutes Intravenous Every 24 hours 06/30/20 2242 07/05/20 2259   06/30/20 2300  azithromycin (ZITHROMAX) 500 mg in sodium chloride 0.9 % 250 mL IVPB        500 mg 250 mL/hr over 60 Minutes Intravenous Every 24 hours 06/30/20 2242 07/05/20 2259     Objective: Vitals: Today's  Vitals   07/03/20 2020 07/04/20 0456 07/04/20 0945 07/04/20 1339  BP:  119/79  (!) 156/74  Pulse:  81  99  Resp:  18  16  Temp:  98.2 F (36.8 C)  98 F (36.7 C)  TempSrc:  Oral  Oral  SpO2:  95%  98%  Weight:      Height:      PainSc: 0-No pain  0-No pain     Intake/Output Summary (Last 24 hours) at 07/04/2020 1541 Last data filed at 07/04/2020 1400 Gross per 24 hour  Intake 1072.12 ml  Output 1250 ml  Net -177.88 ml   Filed Weights   07/03/20 1410  Weight: 64.7 kg   Weight change:   Intake/Output from previous day: 10/06 0701 - 10/07 0700 In: 871.7 [P.O.:360; I.V.:80; IV Piggyback:431.7] Out: 1950 [Urine:1950] Intake/Output this shift: Total I/O In: 912.1 [P.O.:476; IV Piggyback:436.1] Out: -   Examination: General exam: AAO, NAD, weak appearing. HEENT:Oral mucosa moist, Ear/Nose WNL grossly, dentition normal. Respiratory system: bilaterally clear,no wheezing or crackles,no use of accessory muscle Cardiovascular system:  S1 & S2 +, No JVD,. Gastrointestinal system: Abdomen soft, NT,ND, BS+ Nervous System:Alert, awake, moving extremities and grossly nonfocal Extremities: No edema, distal peripheral pulses palpable.  Skin: No rashes,no icterus. MSK: Normal muscle bulk,tone, power  Data Reviewed: I have personally reviewed following labs and imaging studies CBC: Recent Labs  Lab 06/30/20 1604 07/01/20 0525 07/02/20 0025 07/03/20 0541 07/04/20 0500  WBC 17.0* 14.8* 13.9* 12.4* 13.5*  NEUTROABS 14.7* 13.7*  --   --   --   HGB 11.3* 10.2* 10.3* 9.9* 10.5*  HCT 33.0* 29.2* 29.4* 29.7* 31.1*  MCV 81.9 81.1 81.4 85.1 85.0  PLT 250 236 244 259 332   Basic Metabolic Panel: Recent Labs  Lab 06/30/20 1604 06/30/20 2053 07/01/20 0525 07/02/20 0025 07/03/20 0541 07/04/20 0500  NA 135  --  140 144 142 147*  K 4.6  --  3.5 3.5 2.7* 3.8  CL 95*  --  104 108 106 113*  CO2 19*  --  18* 23 27 24   GLUCOSE 126*  --  127* 188* 107* 79  BUN 106*  --  112* 84* 51* 38*  CREATININE 10.65*  --  7.01* 3.35* 1.57* 1.71*  CALCIUM 8.6*  --  8.5* 8.7* 8.4* 8.7*  MG  --  3.0* 2.7* 2.5* 2.1  --   PHOS  --   --  6.1* 3.5 2.3* 1.9*   GFR: Estimated Creatinine Clearance: 25 mL/min (A) (by C-G formula based on SCr of 1.71 mg/dL (H)). Liver Function Tests: Recent Labs  Lab 06/30/20 2054 07/01/20 0525 07/02/20 0025 07/03/20 0541 07/04/20 0500  AST 20 14*  --   --   --   ALT 20 19  --   --   --   ALKPHOS 66 54  --   --   --   BILITOT 0.8 0.9  --   --   --   PROT 7.0 6.7  --   --   --   ALBUMIN 2.5* 2.3* 2.2* 2.2* 2.2*   No results for input(s): LIPASE, AMYLASE in the last 168 hours. No results for input(s): AMMONIA in the last 168 hours. Coagulation Profile: Recent Labs  Lab 06/30/20 2053  INR 1.4*   Cardiac Enzymes: Recent Labs  Lab 06/30/20 2053  CKTOTAL 200   BNP (last 3 results) No results for input(s): PROBNP in the last 8760 hours. HbA1C: No results for input(s): HGBA1C  in the last 72  hours. CBG: Recent Labs  Lab 07/03/20 1955 07/04/20 0020 07/04/20 0352 07/04/20 0903 07/04/20 1104  GLUCAP 249* 126* 80 101* 137*   Lipid Profile: No results for input(s): CHOL, HDL, LDLCALC, TRIG, CHOLHDL, LDLDIRECT in the last 72 hours. Thyroid Function Tests: No results for input(s): TSH, T4TOTAL, FREET4, T3FREE, THYROIDAB in the last 72 hours. Anemia Panel: No results for input(s): VITAMINB12, FOLATE, FERRITIN, TIBC, IRON, RETICCTPCT in the last 72 hours. Sepsis Labs: Recent Labs  Lab 06/30/20 1654 06/30/20 2053 06/30/20 2058  PROCALCITON  --  0.64  --   LATICACIDVEN 2.0* 1.3 1.5    Recent Results (from the past 240 hour(s))  Culture, blood (Routine X 2) w Reflex to ID Panel     Status: None (Preliminary result)   Collection Time: 06/30/20  4:54 PM   Specimen: BLOOD  Result Value Ref Range Status   Specimen Description   Final    BLOOD RIGHT ANTECUBITAL Performed at [facility], [place] [address], [place], [place] [address]    Special Requests   Final    BOTTLES DRAWN AEROBIC AND ANAEROBIC Blood Culture adequate volume Performed at [facility] [address] [place], [place] [address]    Culture   Final    NO GROWTH 4 DAYS Performed at [facility], [place] [address] [place], [place] [address]    Report Status PENDING  Incomplete  Culture, blood (Routine X 2) w Reflex to ID Panel     Status: None (Preliminary result)   Collection Time: 06/30/20  4:59 PM   Specimen: BLOOD  Result Value Ref Range Status   Specimen Description   Final    BLOOD RIGHT ANTECUBITAL Performed at [facility], [place] [address], [place], [place] [address]    Special Requests   Final    BOTTLES DRAWN AEROBIC AND ANAEROBIC Blood Culture adequate volume Performed at [facility] [address] [place], [place] [address]    Culture   Final    NO GROWTH 4 DAYS Performed at [facility], [place] [address], [place],   [address]    Report Status PENDING  Incomplete  Respiratory Panel by RT PCR (Flu A&B, Covid) - Nasopharyngeal Swab     Status: None   Collection Time: 06/30/20  8:53 PM   Specimen: Nasopharyngeal Swab  Result Value Ref Range Status   SARS Coronavirus 2 by RT PCR NEGATIVE NEGATIVE Final    Comment: (NOTE) SARS-CoV-2 target nucleic acids are NOT DETECTED.  The SARS-CoV-2 RNA is generally detectable in upper respiratoy specimens during the acute phase of infection. The lowest concentration of SARS-CoV-2 viral copies this assay can detect is 131 copies/mL. A negative result does not preclude SARS-Cov-2 infection and should not be used as the sole basis for treatment or other patient management decisions. A negative result may occur with  improper specimen collection/handling, submission of specimen other than nasopharyngeal swab, presence of viral mutation(s) within the areas targeted by this assay, and inadequate number of viral copies (<131 copies/mL). A negative result must be combined with clinical observations, patient history, and epidemiological information. The expected result is Negative.  Fact Sheet for Patients:  PinkCheek.be  Fact Sheet for Healthcare Providers:  GravelBags.it  This test is no t yet approved or cleared by the [place] FDA and  has been authorized for detection and/or diagnosis of SARS-CoV-2 by FDA under an Emergency Use Authorization (EUA). This  EUA will remain  in effect (meaning this test can be used) for the duration of the COVID-19 declaration under Section 564(b)(1) of the Act, 21 U.S.C. section 360bbb-3(b)(1), unless the authorization is terminated or revoked sooner.     Influenza A by PCR NEGATIVE NEGATIVE Final   Influenza B by PCR NEGATIVE NEGATIVE Final    Comment: (NOTE) The Xpert Xpress SARS-CoV-2/FLU/RSV assay is intended as an aid in  the diagnosis of influenza from  Nasopharyngeal swab specimens and  should not be used as a sole basis for treatment. Nasal washings and  aspirates are unacceptable for Xpert Xpress SARS-CoV-2/FLU/RSV  testing.  Fact Sheet for Patients: PinkCheek.be  Fact Sheet for Healthcare Providers: GravelBags.it  This test is not yet approved or cleared by the [place] FDA and  has been authorized for detection and/or diagnosis of SARS-CoV-2 by  FDA under an Emergency Use Authorization (EUA). This EUA will remain  in effect (meaning this test can be used) for the duration of the  Covid-19 declaration under Section 564(b)(1) of the Act, 21  U.S.C. section 360bbb-3(b)(1), unless the authorization is  terminated or revoked. Performed at [facility], [place] [address] [place],  [address]      Radiology Studies: No results found.   LOS: 4 days   Antonieta Pert, MD [facility]  07/04/2020, 3:41 PM

## 2020-07-04 NOTE — Progress Notes (Signed)
Patient found sitting on floor with chair alarm sounding. Patient reports he was trying to reposition himself in the chair and slid out. No apparent injuries, VSS. Family and MD notified. Low bed and pads ordered and placed. Will cont to monitor.

## 2020-07-05 ENCOUNTER — Inpatient Hospital Stay (HOSPITAL_COMMUNITY): Payer: PPO

## 2020-07-05 LAB — GLUCOSE, CAPILLARY
Glucose-Capillary: 123 mg/dL — ABNORMAL HIGH (ref 70–99)
Glucose-Capillary: 142 mg/dL — ABNORMAL HIGH (ref 70–99)
Glucose-Capillary: 243 mg/dL — ABNORMAL HIGH (ref 70–99)
Glucose-Capillary: 255 mg/dL — ABNORMAL HIGH (ref 70–99)
Glucose-Capillary: 79 mg/dL (ref 70–99)
Glucose-Capillary: 83 mg/dL (ref 70–99)

## 2020-07-05 LAB — CBC
HCT: 34.3 % — ABNORMAL LOW (ref 39.0–52.0)
Hemoglobin: 11 g/dL — ABNORMAL LOW (ref 13.0–17.0)
MCH: 27.6 pg (ref 26.0–34.0)
MCHC: 32.1 g/dL (ref 30.0–36.0)
MCV: 86 fL (ref 80.0–100.0)
Platelets: 254 10*3/uL (ref 150–400)
RBC: 3.99 MIL/uL — ABNORMAL LOW (ref 4.22–5.81)
RDW: 14.7 % (ref 11.5–15.5)
WBC: 13.4 10*3/uL — ABNORMAL HIGH (ref 4.0–10.5)
nRBC: 0 % (ref 0.0–0.2)

## 2020-07-05 LAB — RENAL FUNCTION PANEL
Albumin: 2.5 g/dL — ABNORMAL LOW (ref 3.5–5.0)
Anion gap: 13 (ref 5–15)
BUN: 27 mg/dL — ABNORMAL HIGH (ref 8–23)
CO2: 23 mmol/L (ref 22–32)
Calcium: 8.7 mg/dL — ABNORMAL LOW (ref 8.9–10.3)
Chloride: 110 mmol/L (ref 98–111)
Creatinine, Ser: 1.41 mg/dL — ABNORMAL HIGH (ref 0.61–1.24)
GFR calc non Af Amer: 45 mL/min — ABNORMAL LOW (ref >60)
Glucose, Bld: 81 mg/dL (ref 70–99)
Phosphorus: 2.7 mg/dL (ref 2.5–4.6)
Potassium: 3.9 mmol/L (ref 3.5–5.1)
Sodium: 146 mmol/L — ABNORMAL HIGH (ref 135–145)

## 2020-07-05 LAB — PHOSPHORUS: Phosphorus: 2.7 mg/dL (ref 2.5–4.6)

## 2020-07-05 LAB — CULTURE, BLOOD (ROUTINE X 2)
Culture: NO GROWTH
Culture: NO GROWTH
Special Requests: ADEQUATE
Special Requests: ADEQUATE

## 2020-07-05 NOTE — Progress Notes (Signed)
PROGRESS NOTE    Sergio [name]  LYY:[id number] DOB: 05/04/1933 DOA: 06/30/2020 PCP: Forrest Moron, MD   Chief Complaint  Patient presents with  . Gout    Brief Narrative: Per previous attending: 86 year old male with history of DM-2, PE, CKD-3B, HTN, gout and possible cognitive impairment presenting with right foot pain and generalized weakness. Had a ED visit on 05/28/2020 for right foot pain. Although there was suspicion for gout at that time, he was not given colchicine due to CKD nor prednisone due to diabetes and discharged on tramadol. He returns with increased right foot pain, generalized weakness and BLE edema.  In ED, hemodynamically stable. WBC 17.0 with left shift. Cr 10.65 (1.8 in 12/2019). BUN 106. Bicarb 19. LA 2.0. Nephrology consulted. Renal ultrasound ordered and concern about mild to moderate bilateral hydronephrosis. CT abdomen and pelvis revealed bilateral moderate hydronephrosis with possible pyelonephritis. Also concern about left lung pneumonia with chest x-ray revealing retrocardiac opacity.  Foley catheter placed.  Started on IV fluid, ceftriaxone and azithromycin and admitted for AKI on CKD-3B.  Managed with IV fluids, followed by nephrology neurology, AKI nicely improved Patient was admitted and started on low-dose prednisone.  Left upper extremity positive for age indeterminate DVT patient is initiated on IV heparin for anticoagulation  Seen by PT/OT and has recommended skilled nursing facility which patient and family has declined.  Subjective:  Seen this am, Alert,awake,interactive but confused. Pulled iv line out today. Was found on floor and reports he did not fall yesterday.  Assessment & Plan:  AKI on CKD stage IIIb: Baseline creatinine 1.8 on 12/2019, on admit 10.6.  Multifactorial due to obstructive nephropathy order ultrasound due to bladder outlet obstruction most likely and also in the setting of diuretics,acei-which has been discontinued.  Foley in  place, having good output, seen by urology advised outpatient follow-up with Foley catheter in the urology office for voiding trial.  Creatinine down to 1.4.  Nephrology has signed off.  Hypokalemia resolved.    Hypophosphatemia : Resolved   Hydronephrosis moderate/bilateral ?BPH/? vesicoureteral reflux: Appreciate urology input Foley in place continue Flomax and Proscar.  Urology advised to keep the Foley at discharge and will plan for voiding trial in the urology office in few weeks.   Acute metabolic encephalopathy/possible dementia,?  Uremia: Appears alert awake but mildly confused with poor insight unclear if this is his baseline but has waxing and waning mental status.  Given that he was found on the floor 10/7 and somewhat confused today we will obtain CT head for completeness.  RLE DVT is indeterminate,history of PE (in the past,confirmed):Now on Eliquis.Continue to monitor closely,patient has multiple falls are at risk of bleeding will need to morning risk versus benefits.  RLE pain suspected to be from Acute Gout?/ DVT?:pain has improved.  On steroid taper continue home daily for few more days and stop.   Abnormal chest x-ray with suspicion for pneumonia but no respiratory symptoms or leukocytosis and elevated procalcitonin.On ceftriaxone azithromycin completed course 10/8.  Normocytic anemia hemoglobin is stable.  Monitor.  Debility/generalized weakness deconditioning continue PT OT will need a skilled nursing/placement on discharge.TOC following.  Family/son has declined skilled nursing facility.  He is high risk for readmission.We will monitor next 24 hours to make sure mental status is improved we will get PT OT eval prior to discharge.   Uncontrolled hypertension:BP borderline controlled, continue amlodipine ( new). home HCTZ and ACE inhibitor discontinued.  Monitor and adjust meds.  L2XN  w/o complication, without  long-term current use of insulin blood sugar fluctuating. Recent  Labs  Lab 07/04/20 2008 07/05/20 0030 07/05/20 0414 07/05/20 0738 07/05/20 1118  GLUCAP 212* 123* 79 83 255*   Lab Results  Component Value Date   HGBA1C 6.7 (H) 06/30/2020   Diet Order            Diet renal/carb modified with fluid restriction Diet-HS Snack? Nothing; Fluid restriction: 1200 mL Fluid; Room service appropriate? Yes; Fluid consistency: Thin  Diet effective now               DVT prophylaxis: SCDs Start: 06/30/20 2304. Code Status:   Code Status: Full Code.  Family Communication: plan of care discussed with patient at bedside.  I called his son and updated, he is adamant on not going to skilled nursing facility and reports he will have family to take care of him at home and is agreeable for home health care.  He is at high risk for readmission. I have educated regarding blood thinners and Foley care.  Status WU:[id number] Remains inpatient appropriate because:Inpatient level of care appropriate due to severity of illness, ongoing mental confusion  Dispo: The patient is from: Home              Anticipated d/c is to: SNF declined. [facility]              Anticipated d/c date is: tomorrow if mentation stable.              Patient currently is not medically stable to d/c.  Consultants: Nephrology, urology Procedures:see note Culture/Microbiology    Component Value Date/Time   SDES  06/30/2020 1659    BLOOD RIGHT ANTECUBITAL Performed at [facility], [place] [address], [place], [place] [address]    SPECREQUEST  06/30/2020 1659    BOTTLES DRAWN AEROBIC AND ANAEROBIC Blood Culture adequate volume Performed at [facility] [address], [place], [place] [address]    CULT  06/30/2020 1659    NO GROWTH 5 DAYS Performed at [facility] [address] [place], [place] [address]    REPTSTATUS 07/05/2020 FINAL 06/30/2020 1659    Other culture-see note  Medications: Scheduled Meds: . acetaminophen  500 mg Oral Q8H  . amLODipine  10  mg Oral Daily  . apixaban  10 mg Oral BID   Followed by  . [START ON 07/11/2020] apixaban  5 mg Oral BID  . Chlorhexidine Gluconate Cloth  6 each Topical Daily  . docusate sodium  100 mg Oral BID  . finasteride  5 mg Oral Daily  . insulin aspart  0-9 Units Subcutaneous Q4H  . potassium & sodium phosphates  1 packet Oral TID WC & HS  . potassium chloride  20 mEq Oral BID  . predniSONE  20 mg Oral Q breakfast  . sodium chloride flush  3 mL Intravenous Q12H  . tamsulosin  0.4 mg Oral Daily   Continuous Infusions:   Antimicrobials: Anti-infectives (From admission, onward)   Start     Dose/Rate Route Frequency Ordered Stop   06/30/20 2300  cefTRIAXone (ROCEPHIN) 2 g in sodium chloride 0.9 % 100 mL IVPB        2 g 200 mL/hr over 30 Minutes Intravenous Every 24 hours 06/30/20 2242 07/05/20 0006   06/30/20 2300  azithromycin (ZITHROMAX) 500 mg in sodium chloride 0.9 % 250 mL IVPB        500 mg 250 mL/hr over 60 Minutes Intravenous  Every 24 hours 06/30/20 2242 07/05/20 0136     Objective: Vitals: Today's Vitals   07/04/20 2300 07/05/20 0418 07/05/20 0827 07/05/20 1235  BP:  (!) 161/80  (!) 181/77  Pulse:  87  97  Resp:  20    Temp:  97.7 F (36.5 C)  98.4 F (36.9 C)  TempSrc:  Oral  Oral  SpO2:  90%  99%  Weight:      Height:      PainSc: 0-No pain  0-No pain     Intake/Output Summary (Last 24 hours) at 07/05/2020 1259 Last data filed at 07/05/2020 1247 Gross per 24 hour  Intake 746.12 ml  Output 1900 ml  Net -1153.88 ml   Filed Weights   07/03/20 1410  Weight: 64.7 kg   Weight change:   Intake/Output from previous day: 10/07 0701 - 10/08 0700 In: 1122.1 [P.O.:686; IV Piggyback:436.1] Out: 900 [Urine:900] Intake/Output this shift: Total I/O In: 100 [P.O.:100] Out: 1000 [Urine:1000]  Examination: General exam: AAOx2 , NAD, weak appearing. HEENT:Oral mucosa moist, Ear/Nose WNL grossly, dentition normal. Respiratory system: bilaterally clear,no wheezing or  crackles,no use of accessory muscle Cardiovascular system: S1 & S2 +, No JVD,. Gastrointestinal system: Abdomen soft, NT,ND, BS+ Nervous System:Alert, awake, moving extremities and grossly nonfocal Extremities: No edema, distal peripheral pulses palpable.  Skin: No rashes,no icterus. MSK: Normal muscle bulk,tone, power  Data Reviewed: I have personally reviewed following labs and imaging studies CBC: Recent Labs  Lab 06/30/20 1604 06/30/20 1604 07/01/20 0525 07/02/20 0025 07/03/20 0541 07/04/20 0500 07/05/20 0421  WBC 17.0*   < > 14.8* 13.9* 12.4* 13.5* 13.4*  NEUTROABS 14.7*  --  13.7*  --   --   --   --   HGB 11.3*   < > 10.2* 10.3* 9.9* 10.5* 11.0*  HCT 33.0*   < > 29.2* 29.4* 29.7* 31.1* 34.3*  MCV 81.9   < > 81.1 81.4 85.1 85.0 86.0  PLT 250   < > 236 244 259 192 254   < > = values in this interval not displayed.   Basic Metabolic Panel: Recent Labs  Lab 06/30/20 1604 06/30/20 2053 07/01/20 0525 07/02/20 0025 07/03/20 0541 07/04/20 0500 07/05/20 0421  NA   < >  --  140 144 142 147* 146*  K   < >  --  3.5 3.5 2.7* 3.8 3.9  CL   < >  --  104 108 106 113* 110  CO2   < >  --  18* 23 27 24 23   GLUCOSE   < >  --  127* 188* 107* 79 81  BUN   < >  --  112* 84* 51* 38* 27*  CREATININE   < >  --  7.01* 3.35* 1.57* 1.71* 1.41*  CALCIUM   < >  --  8.5* 8.7* 8.4* 8.7* 8.7*  MG  --  3.0* 2.7* 2.5* 2.1  --   --   PHOS  --   --  6.1* 3.5 2.3* 1.9* 2.7  2.7   < > = values in this interval not displayed.   GFR: Estimated Creatinine Clearance: 30.3 mL/min (A) (by C-G formula based on SCr of 1.41 mg/dL (H)). Liver Function Tests: Recent Labs  Lab 06/30/20 2054 06/30/20 2054 07/01/20 0525 07/02/20 0025 07/03/20 0541 07/04/20 0500 07/05/20 0421  AST 20  --  14*  --   --   --   --   ALT 20  --  19  --   --   --   --  ALKPHOS 66  --  54  --   --   --   --   BILITOT 0.8  --  0.9  --   --   --   --   PROT 7.0  --  6.7  --   --   --   --   ALBUMIN 2.5*   < > 2.3* 2.2*  2.2* 2.2* 2.5*   < > = values in this interval not displayed.   No results for input(s): LIPASE, AMYLASE in the last 168 hours. No results for input(s): AMMONIA in the last 168 hours. Coagulation Profile: Recent Labs  Lab 06/30/20 2053  INR 1.4*   Cardiac Enzymes: Recent Labs  Lab 06/30/20 2053  CKTOTAL 200   BNP (last 3 results) No results for input(s): PROBNP in the last 8760 hours. HbA1C: No results for input(s): HGBA1C in the last 72 hours. CBG: Recent Labs  Lab 07/04/20 2008 07/05/20 0030 07/05/20 0414 07/05/20 0738 07/05/20 1118  GLUCAP 212* 123* 79 83 255*   Lipid Profile: No results for input(s): CHOL, HDL, LDLCALC, TRIG, CHOLHDL, LDLDIRECT in the last 72 hours. Thyroid Function Tests: No results for input(s): TSH, T4TOTAL, FREET4, T3FREE, THYROIDAB in the last 72 hours. Anemia Panel: No results for input(s): VITAMINB12, FOLATE, FERRITIN, TIBC, IRON, RETICCTPCT in the last 72 hours. Sepsis Labs: Recent Labs  Lab 06/30/20 1654 06/30/20 2053 06/30/20 2058  PROCALCITON  --  0.64  --   LATICACIDVEN 2.0* 1.3 1.5    Recent Results (from the past 240 hour(s))  Culture, blood (Routine X 2) w Reflex to ID Panel     Status: None   Collection Time: 06/30/20  4:54 PM   Specimen: BLOOD  Result Value Ref Range Status   Specimen Description   Final    BLOOD RIGHT ANTECUBITAL Performed at [facility], [place] [address] [place], [place] [address]    Special Requests   Final    BOTTLES DRAWN AEROBIC AND ANAEROBIC Blood Culture adequate volume Performed at [facility] [address] [place], [place] [address]    Culture   Final    NO GROWTH 5 DAYS Performed at [facility], [place] [address], [place], [place] [address]    Report Status 07/05/2020 FINAL  Final  Culture, blood (Routine X 2) w Reflex to ID Panel     Status: None   Collection Time: 06/30/20  4:59 PM   Specimen: BLOOD  Result Value Ref Range Status   Specimen  Description   Final    BLOOD RIGHT ANTECUBITAL Performed at [facility], [place] [address], [place], [place] [address]    Special Requests   Final    BOTTLES DRAWN AEROBIC AND ANAEROBIC Blood Culture adequate volume Performed at [facility] [address], [place],  [address]    Culture   Final    NO GROWTH 5 DAYS Performed at [facility], [place] [address], [place],  [address]    Report Status 07/05/2020 FINAL  Final  Respiratory Panel by RT PCR (Flu A&B, Covid) - Nasopharyngeal Swab     Status: None   Collection Time: 06/30/20  8:53 PM   Specimen: Nasopharyngeal Swab  Result Value Ref Range Status   SARS Coronavirus 2 by RT PCR NEGATIVE NEGATIVE Final    Comment: (NOTE) SARS-CoV-2 target nucleic acids are NOT DETECTED.  The SARS-CoV-2 RNA is generally detectable in upper respiratoy specimens during the acute  phase of infection. The lowest concentration of SARS-CoV-2 viral copies this assay can detect is 131 copies/mL. A negative result does not preclude SARS-Cov-2 infection and should not be used as the sole basis for treatment or other patient management decisions. A negative result may occur with  improper specimen collection/handling, submission of specimen other than nasopharyngeal swab, presence of viral mutation(s) within the areas targeted by this assay, and inadequate number of viral copies (<131 copies/mL). A negative result must be combined with clinical observations, patient history, and epidemiological information. The expected result is Negative.  Fact Sheet for Patients:  PinkCheek.be  Fact Sheet for Healthcare Providers:  GravelBags.it  This test is no t yet approved or cleared by the [place] FDA and  has been authorized for detection and/or diagnosis of SARS-CoV-2 by FDA under an Emergency Use Authorization (EUA). This EUA will remain  in effect  (meaning this test can be used) for the duration of the COVID-19 declaration under Section 564(b)(1) of the Act, 21 U.S.C. section 360bbb-3(b)(1), unless the authorization is terminated or revoked sooner.     Influenza A by PCR NEGATIVE NEGATIVE Final   Influenza B by PCR NEGATIVE NEGATIVE Final    Comment: (NOTE) The Xpert Xpress SARS-CoV-2/FLU/RSV assay is intended as an aid in  the diagnosis of influenza from Nasopharyngeal swab specimens and  should not be used as a sole basis for treatment. Nasal washings and  aspirates are unacceptable for Xpert Xpress SARS-CoV-2/FLU/RSV  testing.  Fact Sheet for Patients: PinkCheek.be  Fact Sheet for Healthcare Providers: GravelBags.it  This test is not yet approved or cleared by the [place] FDA and  has been authorized for detection and/or diagnosis of SARS-CoV-2 by  FDA under an Emergency Use Authorization (EUA). This EUA will remain  in effect (meaning this test can be used) for the duration of the  Covid-19 declaration under Section 564(b)(1) of the Act, 21  U.S.C. section 360bbb-3(b)(1), unless the authorization is  terminated or revoked. Performed at [facility], [place] [address] [place], [place] [address]      Radiology Studies: No results found.   LOS: 5 days   Antonieta Pert, MD [facility]  07/05/2020, 12:59 PM

## 2020-07-05 NOTE — Progress Notes (Signed)
MD Dr. Adonis Huguenin was notified patient lost his IV access. Patient pulled his IV out. Patient has no meds via IV.

## 2020-07-06 LAB — RENAL FUNCTION PANEL
Albumin: 2.5 g/dL — ABNORMAL LOW (ref 3.5–5.0)
Anion gap: 10 (ref 5–15)
BUN: 27 mg/dL — ABNORMAL HIGH (ref 8–23)
CO2: 20 mmol/L — ABNORMAL LOW (ref 22–32)
Calcium: 8.4 mg/dL — ABNORMAL LOW (ref 8.9–10.3)
Chloride: 109 mmol/L (ref 98–111)
Creatinine, Ser: 1.49 mg/dL — ABNORMAL HIGH (ref 0.61–1.24)
GFR, Estimated: 42 mL/min — ABNORMAL LOW (ref >60)
Glucose, Bld: 96 mg/dL (ref 70–99)
Phosphorus: 3.5 mg/dL (ref 2.5–4.6)
Potassium: 4.1 mmol/L (ref 3.5–5.1)
Sodium: 139 mmol/L (ref 135–145)

## 2020-07-06 LAB — CBC
HCT: 35.2 % — ABNORMAL LOW (ref 39.0–52.0)
Hemoglobin: 11.7 g/dL — ABNORMAL LOW (ref 13.0–17.0)
MCH: 28.4 pg (ref 26.0–34.0)
MCHC: 33.2 g/dL (ref 30.0–36.0)
MCV: 85.4 fL (ref 80.0–100.0)
Platelets: 166 K/uL (ref 150–400)
RBC: 4.12 MIL/uL — ABNORMAL LOW (ref 4.22–5.81)
RDW: 14.9 % (ref 11.5–15.5)
WBC: 15 K/uL — ABNORMAL HIGH (ref 4.0–10.5)
nRBC: 0.1 % (ref 0.0–0.2)

## 2020-07-06 LAB — GLUCOSE, CAPILLARY
Glucose-Capillary: 141 mg/dL — ABNORMAL HIGH (ref 70–99)
Glucose-Capillary: 185 mg/dL — ABNORMAL HIGH (ref 70–99)
Glucose-Capillary: 215 mg/dL — ABNORMAL HIGH (ref 70–99)
Glucose-Capillary: 322 mg/dL — ABNORMAL HIGH (ref 70–99)
Glucose-Capillary: 65 mg/dL — ABNORMAL LOW (ref 70–99)
Glucose-Capillary: 83 mg/dL (ref 70–99)
Glucose-Capillary: 90 mg/dL (ref 70–99)

## 2020-07-06 MED ORDER — PREDNISONE 10 MG PO TABS
10.0000 mg | ORAL_TABLET | Freq: Every day | ORAL | Status: DC
  Administered 2020-07-07: 10 mg via ORAL
  Filled 2020-07-06: qty 1

## 2020-07-06 MED ORDER — AMLODIPINE BESYLATE 10 MG PO TABS: 10.0000 mg | ORAL_TABLET | Freq: Every day | ORAL | 1 refills | Status: DC

## 2020-07-06 MED ORDER — APIXABAN 5 MG PO TABS: ORAL_TABLET | ORAL | 0 refills | Status: DC

## 2020-07-06 MED ORDER — PREDNISONE 10 MG PO TABS: 10.0000 mg | ORAL_TABLET | Freq: Every day | ORAL | 0 refills | Status: AC

## 2020-07-06 MED ORDER — HYDRALAZINE HCL 25 MG PO TABS
25.0000 mg | ORAL_TABLET | Freq: Three times a day (TID) | ORAL | Status: DC
  Administered 2020-07-06 – 2020-07-07 (×4): 25 mg via ORAL
  Filled 2020-07-06 (×4): qty 1

## 2020-07-06 MED ORDER — HYDRALAZINE HCL 25 MG PO TABS: 25.0000 mg | ORAL_TABLET | Freq: Three times a day (TID) | ORAL | 0 refills | Status: DC

## 2020-07-06 NOTE — Plan of Care (Signed)
  Problem: Clinical Measurements: Goal: Ability to maintain clinical measurements within normal limits will improve Outcome: Progressing Goal: Will remain free from infection 07/06/2020 0136 by Ellouise Newer, RN Outcome: Progressing 07/06/2020 0134 by Ellouise Newer, RN Outcome: Progressing Goal: Diagnostic test results will improve 07/06/2020 0136 by Ellouise Newer, RN Outcome: Progressing 07/06/2020 0134 by Ellouise Newer, RN Outcome: Progressing   Problem: Activity: Goal: Risk for activity intolerance will decrease 07/06/2020 0136 by Ellouise Newer, RN Outcome: Progressing 07/06/2020 0134 by Ellouise Newer, RN Outcome: Progressing   Problem: Elimination: Goal: Will not experience complications related to bowel motility 07/06/2020 0136 by Ellouise Newer, RN Outcome: Progressing 07/06/2020 0134 by Ellouise Newer, RN Outcome: Progressing Goal: Will not experience complications related to urinary retention 07/06/2020 0136 by Ellouise Newer, RN Outcome: Progressing 07/06/2020 0134 by Ellouise Newer, RN Outcome: Progressing   Problem: Safety: Goal: Ability to remain free from injury will improve 07/06/2020 0136 by Ellouise Newer, RN Outcome: Progressing 07/06/2020 0134 by Ellouise Newer, RN Outcome: Progressing   Problem: Skin Integrity: Goal: Risk for impaired skin integrity will decrease 07/06/2020 0136 by Ellouise Newer, RN Outcome: Progressing 07/06/2020 0134 by Ellouise Newer, RN Outcome: Progressing

## 2020-07-06 NOTE — TOC Progression Note (Signed)
Transition of Care [facility]) - Progression Note    Patient Details  Name: Sergio [name] MRN: [id number] Date of Birth: 29-May-1933  [facility] CM/SW Contact  Joaquin Courts, RN Phone Number: 07/06/2020, 12:14 PM  Clinical Narrative:    [employer] to provide rolling walker.   Expected Discharge Plan: Home/Self Care Barriers to Discharge: No Barriers Identified  Expected Discharge Plan and Services Expected Discharge Plan: Home/Self Care       Living arrangements for the past 2 months: Single Family Home Expected Discharge Date: 07/06/20               DME Arranged: Gilford Rile rolling DME Agency: [employer] Date DME Agency Contacted: 07/06/20 Time DME Agency Contacted: 70 Representative spoke with at DME Agency: On-call referral line             Social Determinants of Health (Jay) Interventions    Readmission Risk Interventions No flowsheet data found.

## 2020-07-06 NOTE — Progress Notes (Signed)
Patients walker not delivered, patients son Ron waiting on walker and reports there is nowhere he can get one tonight and states he does have to be a work at [employer] request his father stay overnight and he will pick him up in the morning. Confirmed with CM Nancy Marus that DME company has been called and walker was to be delivered to patients room. Dr. Maren Beach notified and in agreement. Patient resting comfortably in bed, will continue fall risk protocols and scheduled medications as ordered.

## 2020-07-06 NOTE — Discharge Summary (Signed)
Physician Discharge Summary  Sergio [name] OZD:[id number] DOB: 1933/07/17 DOA: 06/30/2020  PCP: Forrest Moron, MD  Admit date: 06/30/2020 Discharge date: 07/06/2020  Admitted From:Home Disposition:Home  Recommendations for Outpatient Follow-up:  1. Follow up with PCP in 1-2 weeks 2. Please obtain BMP/CBC in one week 3. Please follow up on the following pending results:  Home Health:yes  Equipment/Devices:Yes  Discharge Condition: Stable Code Status:   Code Status: Full Code Diet recommendation:  Diet Order            Diet Carb Modified           Diet renal/carb modified with fluid restriction Diet-HS Snack? Nothing; Fluid restriction: 1200 mL Fluid; Room service appropriate? Yes; Fluid consistency: Thin  Diet effective now                  Brief/Interim Summary: 86 year old male with history of DM-2, PE, CKD-3B, HTN, gout and possible cognitive impairment presenting with right foot pain and generalized weakness. Had a ED visit on 05/28/2020 for right foot pain. Although there was suspicion for gout at that time, he was not given colchicine due to CKD nor prednisone due to diabetes and discharged on tramadol. He returns with increased right foot pain, generalized weakness and BLE edema.  In ED, hemodynamically stable. WBC 17.0 with left shift. Cr 10.65 (1.8in 12/2019). BUN 106. Bicarb 19. LA 2.0. Nephrology consulted. Renal ultrasound ordered and concern about mild to moderate bilateral hydronephrosis. CT abdomen and pelvis revealed bilateral moderate hydronephrosis with possible pyelonephritis. Also concern about left lung pneumonia with chest x-ray revealing retrocardiac opacity.Foley catheter placed. Started on IV fluid, ceftriaxone and azithromycin and admitted for AKI on CKD-3B. Managed with IV fluids, followed by nephrology neurology, AKI nicely improved Patient was admitted and started on low-dose prednisone.  Left upper extremity positive for age indeterminate DVT  patient is initiated on IV heparin for anticoagulation  Seen by PT/OT and has recommended skilled nursing facility which patient and family has declined. He had intermittent mild confusion but overall has improved.  Found on the floor but no fall as per patient since she was on anticoagulation CT head was done no acute finding. Patient is high risk for readmission but family has opted to take him home despite recommendations for skilled nursing facility.  Son says there will be somebody 24/7 at home.  We will set up home health services  Discharge Diagnoses:   AKI on CKD stage IIIb: Baseline creatinine 1.8 on 12/2019, on admit 10.6.  Multifactorial due to obstructive nephropathy order ultrasound due to bladder outlet obstruction most likely and also in the setting of diuretics,acei-which has been discontinued.  Voiding well with Foley in place and will follow up with urology.  Creatinine has been stable at 1.4.  Nephrology has signed off.  Hypokalemia/Hypophosphatemia : Resolved  Hydronephrosis moderate/bilateral ?BPH/? vesicoureteral reflux: Appreciate urology input Foley in place continue Flomax and Proscar.  Urology advised to keep the Foley at discharge and will plan for voiding trial in the urology office in few weeks.  Son has been informed for the need to follow-up with urology.  Acute metabolic encephalopathy/possible dementia,?  Uremia: overall stable. ?baseline memory issues overall he is alert awake oriented-seems to be pretty much oriented interactive and communicative during the daytime.  Patient understands the medical history is going through.  Son has been informed to monitor him closely and assist at home   RLE DVT is indeterminate,history of PE (in the past,confirmed):Now on Eliquis.I have  discussed risks benefits alternatives of anticoagulation with patient and son at this time plan is to continue with that unless he has significant falls.  Will follow up with PCP.    RLE pain  suspected to be from Acute Gout?/ DVT?:pain has improved.  On steroid taper continue home daily for few more days and stop.   Abnormal chest x-ray with suspicion for pneumonia but no respiratory symptoms or leukocytosis and elevated procalcitonin.On ceftriaxone azithromycin completed course 10/8.  Normocytic anemia hemoglobin is stable.  Monitor.  Debility/generalized weakness deconditioning continue PT OT will need a skilled nursing/placement on discharge.TOC following.  Family/son has declined skilled nursing facility.  He is high risk for readmission.we will set up home health.  Patient and his sons understand.  Uncontrolled hypertension:BP borderline controlled he was started on amlodipine, added oral hydralazine and bp better in 150s. His home HCTZ and ACE inhibitor has been discontinued due to significant AKI.  He will need to follow-up with PCP.    W1XB  w/o complication, without long-term current use of insulin blood sugar fluctuating. Cont on Home regimen.  Consults:  Nephrology  urology  Subjective: Resting well, alert,awake oriented at baseline Wants to go home  Discharge Exam: Vitals:   07/06/20 0800 07/06/20 1113  BP:  (!) 158/73  Pulse:  72  Resp:    Temp:    SpO2: 99% 100%   General: Pt is alert, awake, not in acute distress Cardiovascular: RRR, S1/S2 +, no rubs, no gallops Respiratory: CTA bilaterally, no wheezing, no rhonchi Abdominal: Soft, NT, ND, bowel sounds + Extremities: no edema, no cyanosis  Discharge Instructions  Discharge Instructions    Diet Carb Modified   Complete by: As directed    Discharge instructions   Complete by: As directed    Follow-up with urology to remove the Foley catheter in the office. With your primary care doctor to monitor your kidney function as well as blood pressure. Check blood pressure at home and do not take your blood pressure medication if you have low blood pressure especially below 100 at 110  Please call  call MD or return to ER for similar or worsening recurring problem that brought you to hospital or if any fever,nausea/vomiting,abdominal pain, uncontrolled pain, chest pain,  shortness of breath or any other alarming symptoms.  Please follow-up your doctor as instructed in a week time and call the office for appointment.  Please avoid alcohol, smoking, or any other illicit substance and maintain healthy habits including taking your regular medications as prescribed.  You were cared for by a hospitalist during your hospital stay. If you have any questions about your discharge medications or the care you received while you were in the hospital after you are discharged, you can call the unit and ask to speak with the hospitalist on call if the hospitalist that took care of you is not available.  Once you are discharged, your primary care physician will handle any further medical issues. Please note that NO REFILLS for any discharge medications will be authorized once you are discharged, as it is imperative that you return to your primary care physician (or establish a relationship with a primary care physician if you do not have one) for your aftercare needs so that they can reassess your need for medications and monitor your lab values   Increase activity slowly   Complete by: As directed      Allergies as of 07/06/2020   No Known Allergies  Medication List    STOP taking these medications   hydrochlorothiazide 25 MG tablet Commonly known as: HYDRODIURIL   lisinopril 40 MG tablet Commonly known as: ZESTRIL   traMADol 50 MG tablet Commonly known as: ULTRAM     TAKE these medications   amLODipine 10 MG tablet Commonly known as: NORVASC Take 1 tablet (10 mg total) by mouth daily.   apixaban 5 MG Tabs tablet Commonly known as: ELIQUIS 10 mg bid till 07/10/20 then 5 mg bid   finasteride 5 MG tablet Commonly known as: Proscar Take 1 tablet (5 mg total) by mouth daily.    hydrALAZINE 25 MG tablet Commonly known as: APRESOLINE Take 1 tablet (25 mg total) by mouth 3 (three) times daily.   metFORMIN 500 MG tablet Commonly known as: GLUCOPHAGE Take 1 tablet (500 mg total) by mouth daily with breakfast.   ondansetron 4 MG tablet Commonly known as: ZOFRAN Take 1 tablet (4 mg total) by mouth every 8 (eight) hours as needed for nausea or vomiting.   predniSONE 10 MG tablet Commonly known as: DELTASONE Take 1 tablet (10 mg total) by mouth daily with breakfast for 2 days. Start taking on: July 07, 2020   tamsulosin 0.4 MG Caps capsule Commonly known as: FLOMAX Take 1 capsule (0.4 mg total) by mouth daily.            Durable Medical Equipment  (From admission, onward)         Start     Ordered   07/04/20 1557  For home use only DME Walker  Once       Comments: RW w/ 5' wheel  Question:  Patient needs a walker to treat with the following condition  Answer:  Physical deconditioning   07/04/20 1557          Follow-up Information    [facility] Follow up.   Specialty: Home Health Services Why: [facility] will follow you at home for Home Health Physical Therapy. Please call the above number if you have any questions.  Contact information: [address] [place] [address] [phone]        Delia Chimes A, MD Follow up in 1 week(s).   Specialty: Internal Medicine Why: FOR BMP CHECK AND BP MEDS ADJUSTMENT Contact information: [address] [place] [address] [phone]        Alexis Frock, MD. Call in 3 day(s).   Specialty: Urology Why: Outpatient follow-up to remove the Foley catheter Contact information: [address] [place] [address] [phone]              No Known Allergies  The results of significant diagnostics from this hospitalization (including imaging, microbiology, ancillary and laboratory) are listed below for reference.    Microbiology: Recent Results (from the past  240 hour(s))  Culture, blood (Routine X 2) w Reflex to ID Panel     Status: None   Collection Time: 06/30/20  4:54 PM   Specimen: BLOOD  Result Value Ref Range Status   Specimen Description   Final    BLOOD RIGHT ANTECUBITAL Performed at [facility], [place] [address], [place], [place] [address]    Special Requests   Final    BOTTLES DRAWN AEROBIC AND ANAEROBIC Blood Culture adequate volume Performed at [facility] [address] [place], [place] [address]    Culture   Final    NO GROWTH 5 DAYS Performed at [facility], [place] [address] [place], [place] [address]    Report Status 07/05/2020 FINAL  Final  Culture, blood (Routine X 2) w Reflex to ID Panel     Status: None   Collection Time: 06/30/20  4:59 PM   Specimen: BLOOD  Result Value Ref Range Status   Specimen Description   Final    BLOOD RIGHT ANTECUBITAL Performed at [facility], [place] [address] [place], [place] [address]    Special Requests   Final    BOTTLES DRAWN AEROBIC AND ANAEROBIC Blood Culture adequate volume Performed at [facility] [address], [place], [place] [address]    Culture   Final    NO GROWTH 5 DAYS Performed at [facility], [place] [address], [place], [place] [address]    Report Status 07/05/2020 FINAL  Final  Respiratory Panel by RT PCR (Flu A&B, Covid) - Nasopharyngeal Swab     Status: None   Collection Time: 06/30/20  8:53 PM   Specimen: Nasopharyngeal Swab  Result Value Ref Range Status   SARS Coronavirus 2 by RT PCR NEGATIVE NEGATIVE Final    Comment: (NOTE) SARS-CoV-2 target nucleic acids are NOT DETECTED.  The SARS-CoV-2 RNA is generally detectable in upper respiratoy specimens during the acute phase of infection. The lowest concentration of SARS-CoV-2 viral copies this assay can detect is 131 copies/mL. A negative result does not preclude SARS-Cov-2 infection and should not be used as the sole basis for treatment  or other patient management decisions. A negative result may occur with  improper specimen collection/handling, submission of specimen other than nasopharyngeal swab, presence of viral mutation(s) within the areas targeted by this assay, and inadequate number of viral copies (<131 copies/mL). A negative result must be combined with clinical observations, patient history, and epidemiological information. The expected result is Negative.  Fact Sheet for Patients:  PinkCheek.be  Fact Sheet for Healthcare Providers:  GravelBags.it  This test is no t yet approved or cleared by the [place] FDA and  has been authorized for detection and/or diagnosis of SARS-CoV-2 by FDA under an Emergency Use Authorization (EUA). This EUA will remain  in effect (meaning this test can be used) for the duration of the COVID-19 declaration under Section 564(b)(1) of the Act, 21 U.S.C. section 360bbb-3(b)(1), unless the authorization is terminated or revoked sooner.     Influenza A by PCR NEGATIVE NEGATIVE Final   Influenza B by PCR NEGATIVE NEGATIVE Final    Comment: (NOTE) The Xpert Xpress SARS-CoV-2/FLU/RSV assay is intended as an aid in  the diagnosis of influenza from Nasopharyngeal swab specimens and  should not be used as a sole basis for treatment. Nasal washings and  aspirates are unacceptable for Xpert Xpress SARS-CoV-2/FLU/RSV  testing.  Fact Sheet for Patients: PinkCheek.be  Fact Sheet for Healthcare Providers: GravelBags.it  This test is not yet approved or cleared by the [place] FDA and  has been authorized for detection and/or diagnosis of SARS-CoV-2 by  FDA under an Emergency Use Authorization (EUA). This EUA will remain  in effect (meaning this test can be used) for the duration of the  Covid-19 declaration under Section 564(b)(1) of the Act, 21  U.S.C.  section 360bbb-3(b)(1), unless the authorization is  terminated or revoked. Performed at [facility], [place] [address], [place], [place] [address]     Procedures/Studies: DG Abd 1 View  Result Date: 06/30/2020 CLINICAL DATA:  Constipation EXAM: ABDOMEN - 1 VIEW COMPARISON:  None. FINDINGS: The bowel gas pattern is normal. No radio-opaque calculi or other significant radiographic abnormality are seen. The stool burden appears to be average. IMPRESSION: Negative. Electronically Signed   By: Constance Holster M.D.   On: 06/30/2020 21:58   CT HEAD WO CONTRAST  Result Date: 07/05/2020 CLINICAL DATA:  Altered mental status.  Recent fall EXAM: CT HEAD WITHOUT CONTRAST TECHNIQUE: Contiguous axial images were obtained from the base of the skull through the vertex without intravenous contrast. COMPARISON:  None. FINDINGS: Brain: There is mild to moderate diffuse atrophy. There is no intracranial mass, hemorrhage, extra-axial fluid collection, or midline shift. There is patchy small vessel disease in the centra semiovale bilaterally. No acute infarct is appreciable. Vascular: There is no hyperdense vessel. There is calcification in the left carotid siphon region. Skull: The bony calvarium appears intact. Sinuses/Orbits: Visualized paranasal sinuses are clear. There is a concha bullosa on each side, an anatomic variant. There is evidence of previous cataract removal on the left. Orbits otherwise appear symmetric. Other: Mastoid air cells are clear. IMPRESSION: Atrophy with periventricular small vessel disease. No acute infarct evident. No mass or hemorrhage. There are foci of calcification in the left carotid siphon region. Electronically Signed   By: Lowella Grip III M.D.   On: 07/05/2020 14:28   US RENAL  Result Date: 06/30/2020 CLINICAL DATA:  Acute renal insufficiency. EXAM: RENAL / URINARY TRACT ULTRASOUND COMPLETE COMPARISON:  None. FINDINGS: Right Kidney: Renal measurements: 10.3  cm x 5.2 cm x 7.0 cm = volume: 195.7 mL. Echogenicity within normal limits. Mild to moderate severity right-sided hydronephrosis is seen. No mass is visualized. Left Kidney: Renal measurements: 10.3 cm x 5.6 cm x 5.3 cm = volume: 109.5 mL. Echogenicity within normal limits. There is mild left-sided hydronephrosis. A 1.7 cm x 1.2 cm x 1.3 cm anechoic structure is seen within the lower pole of the left kidney. No flow is seen within this region on color Doppler evaluation. Bladder: Appears normal for degree of bladder distention. Other: None. IMPRESSION: 1. Small left renal cyst. 2. Mild to moderate severity bilateral hydronephrosis, right greater than left. Electronically Signed   By: Virgina Norfolk M.D.   On: 06/30/2020 21:14   DG Chest Port 1 View  Result Date: 06/30/2020 CLINICAL DATA:  Shortness of breath EXAM: PORTABLE CHEST 1 VIEW COMPARISON:  Chest x-ray 01/21/2020, CT chest 07/28/2006. FINDINGS: The heart size and mediastinal contours are within normal limits. Aortic arch calcifications. Right base versus changes. Interval development of retrocardiac opacity. No pulmonary edema. No pleural effusion. No pneumothorax. No acute osseous abnormality. IMPRESSION: Interval development of retrocardiac opacity that could represent infection/inflammation versus atelectasis. Electronically Signed   By: Iven Finn M.D.   On: 06/30/2020 20:18   CT RENAL STONE STUDY  Result Date: 06/30/2020 CLINICAL DATA:  Patient reports pain in bilateral feet for gout. Patient reports he was started on medication and that is has started to get worse again. Hydronephrosis. EXAM: CT ABDOMEN AND PELVIS WITHOUT CONTRAST TECHNIQUE: Multidetector CT imaging of the abdomen and pelvis was performed following the standard protocol without IV contrast. COMPARISON:  US renal 06/30/20. FINDINGS: Lower chest: Bilateral lower lobes atelectasis. no acute abnormality. Hepatobiliary: No focal liver abnormality is seen. Calcified gallstones  within the gallbladder lumen. No gallbladder wall thickening or biliary dilatation. Pancreas: Unremarkable. No pancreatic ductal dilatation or surrounding inflammatory changes. Spleen: Normal in size without focal abnormality. Adrenals/Urinary Tract: No adrenal nodule bilaterally. Bilateral perinephric and periureteral fat stranding. subcentimeter hypodensity. No  nephrolithiasis, no hydronephrosis, and otherwise no contour-deforming renal mass. No ureterolithiasis. Bilateral moderate hydroureter. The urinary bladder wall thickening and perivascular fat stranding. The foley catheter terminates in the urinary bladder. Stomach/Bowel: Stomach is within normal limits. Appendix appears normal. No evidence of bowel wall thickening, distention, or inflammatory changes. Vascular/Lymphatic: No significant vascular findings are present. No enlarged abdominal or pelvic lymph nodes. Reproductive: The prostate is enlarged measuring up to 5.4cm. Other: No abdominal wall hernia or abnormality. No abdominopelvic ascites. Musculoskeletal: No acute or significant osseous findings. IMPRESSION: 1. Bilateral moderate hydroureter, bilateral perinephrenic/periureteral fat stranding, and perivesicular fat stranding. No radiopaque ureteronephrolithiasis. Findings likely represents infection. Underlying intraluminal lesion within the collecting systems or non-radiopaque stones cannot be excluded. 2. Cholelithiasis. Electronically Signed   By: Iven Finn M.D.   On: 06/30/2020 23:39   VAS [place] LOWER EXTREMITY VENOUS (DVT)  Result Date: 07/01/2020  Lower Venous DVTStudy Indications: Swelling, and Pain.  Comparison Study: no prior Performing Technologist: Abram Sander RVS  Examination Guidelines: A complete evaluation includes B-mode imaging, spectral Doppler, color Doppler, and power Doppler as needed of all accessible portions of each vessel. Bilateral testing is considered an integral part of a complete examination. Limited examinations  for reoccurring indications may be performed as noted. The reflux portion of the exam is performed with the patient in reverse Trendelenburg.  +---------+---------------+---------+-----------+----------+-----------------+ RIGHT    CompressibilityPhasicitySpontaneityPropertiesThrombus Aging    +---------+---------------+---------+-----------+----------+-----------------+ CFV      None           No       No                   Age Indeterminate +---------+---------------+---------+-----------+----------+-----------------+ SFJ      Full                                                           +---------+---------------+---------+-----------+----------+-----------------+ FV Prox  None                                         Age Indeterminate +---------+---------------+---------+-----------+----------+-----------------+ FV Mid   None                                         Age Indeterminate +---------+---------------+---------+-----------+----------+-----------------+ FV DistalNone                                         Age Indeterminate +---------+---------------+---------+-----------+----------+-----------------+ PFV      None                                         Age Indeterminate +---------+---------------+---------+-----------+----------+-----------------+ POP      Full           Yes      Yes                                    +---------+---------------+---------+-----------+----------+-----------------+  PTV      Full                                                           +---------+---------------+---------+-----------+----------+-----------------+ PERO     Full                                                           +---------+---------------+---------+-----------+----------+-----------------+ Attempted to visualize iliac veins- not visualized well.  +---------+---------------+---------+-----------+----------+--------------+ LEFT      CompressibilityPhasicitySpontaneityPropertiesThrombus Aging +---------+---------------+---------+-----------+----------+--------------+ CFV      Full           Yes      Yes                                 +---------+---------------+---------+-----------+----------+--------------+ SFJ      Full                                                        +---------+---------------+---------+-----------+----------+--------------+ FV Prox  Full                                                        +---------+---------------+---------+-----------+----------+--------------+ FV Mid   Full                                                        +---------+---------------+---------+-----------+----------+--------------+ FV DistalFull                                                        +---------+---------------+---------+-----------+----------+--------------+ PFV      Full                                                        +---------+---------------+---------+-----------+----------+--------------+ POP      Full           Yes      Yes                                 +---------+---------------+---------+-----------+----------+--------------+ PTV      Full                                                        +---------+---------------+---------+-----------+----------+--------------+  PERO     Full                                                        +---------+---------------+---------+-----------+----------+--------------+     Summary: RIGHT: - Findings consistent with age indeterminate deep vein thrombosis involving the right common femoral vein, right femoral vein, and right proximal profunda vein. - No cystic structure found in the popliteal fossa.  LEFT: - There is no evidence of deep vein thrombosis in the lower extremity.  - No cystic structure found in the popliteal fossa.  *See table(s) above for measurements and observations. Electronically  signed by Ruta Hinds MD on 07/01/2020 at 4:54:53 PM.    Final     Labs: BNP (last 3 results) No results for input(s): BNP in the last 8760 hours. Basic Metabolic Panel: Recent Labs  Lab 06/30/20 1604 06/30/20 2053 07/01/20 0525 07/01/20 0525 07/02/20 0025 07/03/20 0541 07/04/20 0500 07/05/20 0421 07/06/20 0533  NA   < >  --  140   < > 144 142 147* 146* 139  K   < >  --  3.5   < > 3.5 2.7* 3.8 3.9 4.1  CL   < >  --  104   < > 108 106 113* 110 109  CO2   < >  --  18*   < > 23 27 24 23  20*  GLUCOSE   < >  --  127*   < > 188* 107* 79 81 96  BUN   < >  --  112*   < > 84* 51* 38* 27* 27*  CREATININE   < >  --  7.01*   < > 3.35* 1.57* 1.71* 1.41* 1.49*  CALCIUM   < >  --  8.5*   < > 8.7* 8.4* 8.7* 8.7* 8.4*  MG  --  3.0* 2.7*  --  2.5* 2.1  --   --   --   PHOS  --   --  6.1*   < > 3.5 2.3* 1.9* 2.7  2.7 3.5   < > = values in this interval not displayed.   Liver Function Tests: Recent Labs  Lab 06/30/20 2054 06/30/20 2054 07/01/20 0525 07/01/20 0525 07/02/20 0025 07/03/20 0541 07/04/20 0500 07/05/20 0421 07/06/20 0533  AST 20  --  14*  --   --   --   --   --   --   ALT 20  --  19  --   --   --   --   --   --   ALKPHOS 66  --  54  --   --   --   --   --   --   BILITOT 0.8  --  0.9  --   --   --   --   --   --   PROT 7.0  --  6.7  --   --   --   --   --   --   ALBUMIN 2.5*   < > 2.3*   < > 2.2* 2.2* 2.2* 2.5* 2.5*   < > = values in this interval not displayed.   No results for input(s): LIPASE, AMYLASE in the last 168 hours. No results for input(s): AMMONIA in the last 168 hours.  CBC: Recent Labs  Lab 06/30/20 1604 06/30/20 1604 07/01/20 0525 07/01/20 0525 07/02/20 0025 07/03/20 0541 07/04/20 0500 07/05/20 0421 07/06/20 0533  WBC 17.0*   < > 14.8*   < > 13.9* 12.4* 13.5* 13.4* 15.0*  NEUTROABS 14.7*  --  13.7*  --   --   --   --   --   --   HGB 11.3*   < > 10.2*   < > 10.3* 9.9* 10.5* 11.0* 11.7*  HCT 33.0*   < > 29.2*   < > 29.4* 29.7* 31.1* 34.3* 35.2*   MCV 81.9   < > 81.1   < > 81.4 85.1 85.0 86.0 85.4  PLT 250   < > 236   < > 244 259 192 254 166   < > = values in this interval not displayed.   Cardiac Enzymes: Recent Labs  Lab 06/30/20 2053  CKTOTAL 200   BNP: Invalid input(s): POCBNP CBG: Recent Labs  Lab 07/05/20 2004 07/06/20 0001 07/06/20 0416 07/06/20 0758 07/06/20 1115  GLUCAP 142* 215* 83 90 141*   D-Dimer No results for input(s): DDIMER in the last 72 hours. Hgb A1c No results for input(s): HGBA1C in the last 72 hours. Lipid Profile No results for input(s): CHOL, HDL, LDLCALC, TRIG, CHOLHDL, LDLDIRECT in the last 72 hours. Thyroid function studies No results for input(s): TSH, T4TOTAL, T3FREE, THYROIDAB in the last 72 hours.  Invalid input(s): FREET3 Anemia work up No results for input(s): VITAMINB12, FOLATE, FERRITIN, TIBC, IRON, RETICCTPCT in the last 72 hours. Urinalysis    Component Value Date/Time   COLORURINE YELLOW 06/30/2020 2140   APPEARANCEUR CLEAR 06/30/2020 2140   LABSPEC 1.011 06/30/2020 2140   PHURINE 5.0 06/30/2020 2140   GLUCOSEU NEGATIVE 06/30/2020 2140   HGBUR NEGATIVE 06/30/2020 2140   BILIRUBINUR NEGATIVE 06/30/2020 2140   BILIRUBINUR negative 10/01/2017 0901   BILIRUBINUR neg 04/02/2014 1605   KETONESUR NEGATIVE 06/30/2020 2140   PROTEINUR NEGATIVE 06/30/2020 2140   UROBILINOGEN 0.2 10/01/2017 0901   UROBILINOGEN 0.2 02/12/2015 2205   NITRITE NEGATIVE 06/30/2020 2140   LEUKOCYTESUR NEGATIVE 06/30/2020 2140   Sepsis Labs Invalid input(s): PROCALCITONIN,  WBC,  LACTICIDVEN Microbiology Recent Results (from the past 240 hour(s))  Culture, blood (Routine X 2) w Reflex to ID Panel     Status: None   Collection Time: 06/30/20  4:54 PM   Specimen: BLOOD  Result Value Ref Range Status   Specimen Description   Final    BLOOD RIGHT ANTECUBITAL Performed at [facility], [place] [address] [place], [place] [address]    Special Requests   Final    BOTTLES DRAWN AEROBIC AND  ANAEROBIC Blood Culture adequate volume Performed at [place] [address] [place], [place] [address]    Culture   Final    NO GROWTH 5 DAYS Performed at [facility], [place] [address], [place], [place] [address]    Report Status 07/05/2020 FINAL  Final  Culture, blood (Routine X 2) w Reflex to ID Panel     Status: None   Collection Time: 06/30/20  4:59 PM   Specimen: BLOOD  Result Value Ref Range Status   Specimen Description   Final    BLOOD RIGHT ANTECUBITAL Performed at [facility], [place] [address] [place], [place] [address]    Special Requests   Final    BOTTLES DRAWN AEROBIC AND ANAEROBIC Blood Culture adequate volume Performed at [facility],  [place] [address], [place], [place] [address]    Culture   Final    NO GROWTH 5 DAYS Performed at [facility], [place] [address], [place], [place] [address]    Report Status 07/05/2020 FINAL  Final  Respiratory Panel by RT PCR (Flu A&B, Covid) - Nasopharyngeal Swab     Status: None   Collection Time: 06/30/20  8:53 PM   Specimen: Nasopharyngeal Swab  Result Value Ref Range Status   SARS Coronavirus 2 by RT PCR NEGATIVE NEGATIVE Final    Comment: (NOTE) SARS-CoV-2 target nucleic acids are NOT DETECTED.  The SARS-CoV-2 RNA is generally detectable in upper respiratoy specimens during the acute phase of infection. The lowest concentration of SARS-CoV-2 viral copies this assay can detect is 131 copies/mL. A negative result does not preclude SARS-Cov-2 infection and should not be used as the sole basis for treatment or other patient management decisions. A negative result may occur with  improper specimen collection/handling, submission of specimen other than nasopharyngeal swab, presence of viral mutation(s) within the areas targeted by this assay, and inadequate number of viral copies (<131 copies/mL). A negative result must be combined with clinical observations, patient  history, and epidemiological information. The expected result is Negative.  Fact Sheet for Patients:  PinkCheek.be  Fact Sheet for Healthcare Providers:  GravelBags.it  This test is no t yet approved or cleared by the [place] FDA and  has been authorized for detection and/or diagnosis of SARS-CoV-2 by FDA under an Emergency Use Authorization (EUA). This EUA will remain  in effect (meaning this test can be used) for the duration of the COVID-19 declaration under Section 564(b)(1) of the Act, 21 U.S.C. section 360bbb-3(b)(1), unless the authorization is terminated or revoked sooner.     Influenza A by PCR NEGATIVE NEGATIVE Final   Influenza B by PCR NEGATIVE NEGATIVE Final    Comment: (NOTE) The Xpert Xpress SARS-CoV-2/FLU/RSV assay is intended as an aid in  the diagnosis of influenza from Nasopharyngeal swab specimens and  should not be used as a sole basis for treatment. Nasal washings and  aspirates are unacceptable for Xpert Xpress SARS-CoV-2/FLU/RSV  testing.  Fact Sheet for Patients: PinkCheek.be  Fact Sheet for Healthcare Providers: GravelBags.it  This test is not yet approved or cleared by the [place] FDA and  has been authorized for detection and/or diagnosis of SARS-CoV-2 by  FDA under an Emergency Use Authorization (EUA). This EUA will remain  in effect (meaning this test can be used) for the duration of the  Covid-19 declaration under Section 564(b)(1) of the Act, 21  U.S.C. section 360bbb-3(b)(1), unless the authorization is  terminated or revoked. Performed at [facility], [place] [address], [place], [place] [address]    Time coordinating discharge: 35  minutes  SIGNED: Antonieta Pert, MD  [facility] 07/06/2020, 11:18 AM  If 7PM-7AM, please contact night-coverage [email or web address]

## 2020-07-06 NOTE — Plan of Care (Signed)
°  Problem: Clinical Measurements: Goal: Will remain free from infection Outcome: Progressing Goal: Diagnostic test results will improve Outcome: Progressing   Problem: Activity: Goal: Risk for activity intolerance will decrease Outcome: Progressing   Problem: Elimination: Goal: Will not experience complications related to bowel motility Outcome: Progressing Goal: Will not experience complications related to urinary retention Outcome: Progressing   Problem: Safety: Goal: Ability to remain free from injury will improve Outcome: Progressing   Problem: Skin Integrity: Goal: Risk for impaired skin integrity will decrease Outcome: Progressing

## 2020-07-07 LAB — GLUCOSE, CAPILLARY
Glucose-Capillary: 101 mg/dL — ABNORMAL HIGH (ref 70–99)
Glucose-Capillary: 103 mg/dL — ABNORMAL HIGH (ref 70–99)
Glucose-Capillary: 95 mg/dL (ref 70–99)

## 2020-07-07 LAB — RENAL FUNCTION PANEL
Albumin: 2.4 g/dL — ABNORMAL LOW (ref 3.5–5.0)
Anion gap: 7 (ref 5–15)
BUN: 27 mg/dL — ABNORMAL HIGH (ref 8–23)
CO2: 21 mmol/L — ABNORMAL LOW (ref 22–32)
Calcium: 8.4 mg/dL — ABNORMAL LOW (ref 8.9–10.3)
Chloride: 107 mmol/L (ref 98–111)
Creatinine, Ser: 1.23 mg/dL (ref 0.61–1.24)
GFR, Estimated: 53 mL/min — ABNORMAL LOW (ref >60)
Glucose, Bld: 107 mg/dL — ABNORMAL HIGH (ref 70–99)
Phosphorus: 3.1 mg/dL (ref 2.5–4.6)
Potassium: 3.9 mmol/L (ref 3.5–5.1)
Sodium: 135 mmol/L (ref 135–145)

## 2020-07-07 LAB — CBC
HCT: 32.4 % — ABNORMAL LOW (ref 39.0–52.0)
Hemoglobin: 10.9 g/dL — ABNORMAL LOW (ref 13.0–17.0)
MCH: 28.7 pg (ref 26.0–34.0)
MCHC: 33.6 g/dL (ref 30.0–36.0)
MCV: 85.3 fL (ref 80.0–100.0)
Platelets: 218 10*3/uL (ref 150–400)
RBC: 3.8 MIL/uL — ABNORMAL LOW (ref 4.22–5.81)
RDW: 14.9 % (ref 11.5–15.5)
WBC: 17.2 10*3/uL — ABNORMAL HIGH (ref 4.0–10.5)
nRBC: 0 % (ref 0.0–0.2)

## 2020-07-07 NOTE — Discharge Instructions (Signed)
Indwelling Urinary Catheter Care, Adult An indwelling urinary catheter is a thin tube that is put into your bladder. The tube helps to drain pee (urine) out of your body. The tube goes in through your urethra. Your urethra is where pee comes out of your body. Your pee will come out through the catheter, then it will go into a bag (drainage bag). Take good care of your catheter so it will work well. How to wear your catheter and bag Supplies needed  Sticky tape (adhesive tape) or a leg strap.  Alcohol wipe or soap and water (if you use tape).  A clean towel (if you use tape).  Large overnight bag.  Smaller bag (leg bag). Wearing your catheter Attach your catheter to your leg with tape or a leg strap.  Make sure the catheter is not pulled tight.  If a leg strap gets wet, take it off and put on a dry strap.  If you use tape to hold the bag on your leg: 1. Use an alcohol wipe or soap and water to wash your skin where the tape made it sticky before. 2. Use a clean towel to pat-dry that skin. 3. Use new tape to make the bag stay on your leg. Wearing your bags You should have been given a large overnight bag.  You may wear the overnight bag in the day or night.  Always have the overnight bag lower than your bladder.  Do not let the bag touch the floor.  Before you go to sleep, put a clean plastic bag in a wastebasket. Then hang the overnight bag inside the wastebasket. You should also have a smaller leg bag that fits under your clothes.  Always wear the leg bag below your knee.  Do not wear your leg bag at night. How to care for your skin and catheter Supplies needed  A clean washcloth.  Water and mild soap.  A clean towel. Caring for your skin and catheter      Clean the skin around your catheter every day: 1. Wash your hands with soap and water. 2. Wet a clean washcloth in warm water and mild soap. 3. Clean the skin around your urethra.  If you are  male:  Gently spread the folds of skin around your vagina (labia).  With the washcloth in your other hand, wipe the inner side of your labia on each side. Wipe from front to back.  If you are male:  Pull back any skin that covers the end of your penis (foreskin).  With the washcloth in your other hand, wipe your penis in small circles. Start wiping at the tip of your penis, then move away from the catheter.  Move the foreskin back in place, if needed. 4. With your free hand, hold the catheter close to where it goes into your body.  Keep holding the catheter during cleaning so it does not get pulled out. 5. With the washcloth in your other hand, clean the catheter.  Only wipe downward on the catheter.  Do not wipe upward toward your body. Doing this may push germs into your urethra and cause infection. 6. Use a clean towel to pat-dry the catheter and the skin around it. Make sure to wipe off all soap. 7. Wash your hands with soap and water.  Shower every day. Do not take baths.  Do not use cream, ointment, or lotion on the area where the catheter goes into your body, unless your doctor tells you   to.  Do not use powders, sprays, or lotions on your genital area.  Check your skin around the catheter every day for signs of infection. Check for: ? Redness, swelling, or pain. ? Fluid or blood. ? Warmth. ? Pus or a bad smell. How to empty the bag Supplies needed  Rubbing alcohol.  Gauze pad or cotton ball.  Tape or a leg strap. Emptying the bag Pour the pee out of your bag when it is ?- full, or at least 2-3 times a day. Do this for your overnight bag and your leg bag. 1. Wash your hands with soap and water. 2. Separate (detach) the bag from your leg. 3. Hold the bag over the toilet or a clean pail. Keep the bag lower than your hips and bladder. This is so the pee (urine) does not go back into the tube. 4. Open the pour spout. It is at the bottom of the bag. 5. Empty the  pee into the toilet or pail. Do not let the pour spout touch any surface. 6. Put rubbing alcohol on a gauze pad or cotton ball. 7. Use the gauze pad or cotton ball to clean the pour spout. 8. Close the pour spout. 9. Attach the bag to your leg with tape or a leg strap. 10. Wash your hands with soap and water. Follow instructions for cleaning the drainage bag:  From the product maker.  As told by your doctor. How to change the bag Supplies needed  Alcohol wipes.  A clean bag.  Tape or a leg strap. Changing the bag Replace your bag when it starts to leak, smell bad, or look dirty. 1. Wash your hands with soap and water. 2. Separate the dirty bag from your leg. 3. Pinch the catheter with your fingers so that pee does not spill out. 4. Separate the catheter tube from the bag tube where these tubes connect (at the connection valve). Do not let the tubes touch any surface. 5. Clean the end of the catheter tube with an alcohol wipe. Use a different alcohol wipe to clean the end of the bag tube. 6. Connect the catheter tube to the tube of the clean bag. 7. Attach the clean bag to your leg with tape or a leg strap. Do not make the bag tight on your leg. 8. Wash your hands with soap and water. General rules   Never pull on your catheter. Never try to take it out. Doing that can hurt you.  Always wash your hands before and after you touch your catheter or bag. Use a mild, fragrance-free soap. If you do not have soap and water, use hand sanitizer.  Always make sure there are no twists or bends (kinks) in the catheter tube.  Always make sure there are no leaks in the catheter or bag.  Drink enough fluid to keep your pee pale yellow.  Do not take baths, swim, or use a hot tub.  If you are male, wipe from front to back after you poop (have a bowel movement). Contact a doctor if:  Your pee is cloudy.  Your pee smells worse than usual.  Your catheter gets clogged.  Your catheter  leaks.  Your bladder feels full. Get help right away if:  You have redness, swelling, or pain where the catheter goes into your body.  You have fluid, blood, pus, or a bad smell coming from the area where the catheter goes into your body.  Your skin feels warm where   the catheter goes into your body.  You have a fever.  You have pain in your: ? Belly (abdomen). ? Legs. ? Lower back. ? Bladder.  You see blood in the catheter.  Your pee is pink or red.  You feel sick to your stomach (nauseous).  You throw up (vomit).  You have chills.  Your pee is not draining into the bag.  Your catheter gets pulled out. Summary  An indwelling urinary catheter is a thin tube that is placed into the bladder to help drain pee (urine) out of the body.  The catheter is placed into the part of the body that drains pee from the bladder (urethra).  Taking good care of your catheter will keep it working properly and help prevent problems.  Always wash your hands before and after touching your catheter or bag.  Never pull on your catheter or try to take it out. This information is not intended to replace advice given to you by your health care provider. Make sure you discuss any questions you have with your health care provider. Document Revised: 01/06/2019 Document Reviewed: 04/30/2017 Elsevier Patient Education  Rachel on my medicine - ELIQUIS (apixaban)  This medication education was reviewed with me or my healthcare representative as part of my discharge preparation.  The pharmacist that spoke with me during my hospital stay was:  Steva Colder, Student-PharmD  Why was Eliquis prescribed for you? Eliquis was prescribed to treat blood clots that may have been found in the veins of your legs (deep vein thrombosis) or in your lungs (pulmonary embolism) and to reduce the risk of them occurring again.  What do You need to know about Eliquis ? The starting  dose is 10 mg (two 5 mg tablets) taken TWICE daily for the FIRST SEVEN (7) DAYS, then on Thursday 07/11/20  the dose is reduced to ONE 5 mg tablet taken TWICE daily.  Eliquis may be taken with or without food.   Try to take the dose about the same time in the morning and in the evening. If you have difficulty swallowing the tablet whole please discuss with your pharmacist how to take the medication safely.  Take Eliquis exactly as prescribed and DO NOT stop taking Eliquis without talking to the doctor who prescribed the medication.  Stopping may increase your risk of developing a new blood clot.  Refill your prescription before you run out.  After discharge, you should have regular check-up appointments with your healthcare provider that is prescribing your Eliquis.    What do you do if you miss a dose? If a dose of ELIQUIS is not taken at the scheduled time, take it as soon as possible on the same day and twice-daily administration should be resumed. The dose should not be doubled to make up for a missed dose.  Important Safety Information A possible side effect of Eliquis is bleeding. You should call your healthcare provider right away if you experience any of the following: ? Bleeding from an injury or your nose that does not stop. ? Unusual colored urine (red or dark brown) or unusual colored stools (red or black). ? Unusual bruising for unknown reasons. ? A serious fall or if you hit your head (even if there is no bleeding).  Some medicines may interact with Eliquis and might increase your risk of bleeding or clotting while on Eliquis. To help avoid this, consult your healthcare provider or pharmacist prior to using any new prescription  or non-prescription medications, including herbals, vitamins, non-steroidal anti-inflammatory drugs (NSAIDs) and supplements.  This website has more information on Eliquis (apixaban): http://www.eliquis.com/eliquis/home

## 2020-07-07 NOTE — Progress Notes (Signed)
Patient is stable at this time. Discharge instructions reviewed with patients son. Foley education provided, Questions concerns denied.

## 2020-07-07 NOTE — Progress Notes (Signed)
Blood sugar 65. Alert and oriented. Snack given . Will repeat blood sugar.

## 2020-07-07 NOTE — Progress Notes (Signed)
Patient discharge was held yesterday as he did not have walker and son was working evening. DME has been arranged, he is being discharged home today.  He had low blood sugar yesterday but at this time he is not on insulin has Metformin at home and his renal function has improved significantly  Creatinine at 1.2.  Blood sugar stable today he is advised to follow-up CBG at home closely.  Seen this am is alert awake oriented at baseline.  Has been eating well. He is eager to go home today.

## 2020-07-07 NOTE — Plan of Care (Signed)
  Problem: Health Behavior/Discharge Planning: Goal: Ability to manage health-related needs will improve Outcome: Progressing   Problem: Clinical Measurements: Goal: Ability to maintain clinical measurements within normal limits will improve Outcome: Progressing Goal: Will remain free from infection Outcome: Progressing   Problem: Activity: Goal: Risk for activity intolerance will decrease Outcome: Progressing   Problem: Elimination: Goal: Will not experience complications related to bowel motility Outcome: Progressing Goal: Will not experience complications related to urinary retention Outcome: Progressing   Problem: Safety: Goal: Ability to remain free from injury will improve Outcome: Progressing   Problem: Skin Integrity: Goal: Risk for impaired skin integrity will decrease Outcome: Progressing

## 2020-07-11 DIAGNOSIS — Z87891 Personal history of nicotine dependence: Secondary | ICD-10-CM | POA: Diagnosis not present

## 2020-07-11 DIAGNOSIS — Z7984 Long term (current) use of oral hypoglycemic drugs: Secondary | ICD-10-CM | POA: Diagnosis not present

## 2020-07-11 DIAGNOSIS — M10072 Idiopathic gout, left ankle and foot: Secondary | ICD-10-CM | POA: Diagnosis not present

## 2020-07-11 DIAGNOSIS — E1122 Type 2 diabetes mellitus with diabetic chronic kidney disease: Secondary | ICD-10-CM | POA: Diagnosis not present

## 2020-07-11 DIAGNOSIS — Z86010 Personal history of colonic polyps: Secondary | ICD-10-CM | POA: Diagnosis not present

## 2020-07-11 DIAGNOSIS — Z86718 Personal history of other venous thrombosis and embolism: Secondary | ICD-10-CM | POA: Diagnosis not present

## 2020-07-11 DIAGNOSIS — N1832 Chronic kidney disease, stage 3b: Secondary | ICD-10-CM | POA: Diagnosis not present

## 2020-07-11 DIAGNOSIS — S81001D Unspecified open wound, right knee, subsequent encounter: Secondary | ICD-10-CM | POA: Diagnosis not present

## 2020-07-11 DIAGNOSIS — Z466 Encounter for fitting and adjustment of urinary device: Secondary | ICD-10-CM | POA: Diagnosis not present

## 2020-07-11 DIAGNOSIS — N281 Cyst of kidney, acquired: Secondary | ICD-10-CM | POA: Diagnosis not present

## 2020-07-11 DIAGNOSIS — N138 Other obstructive and reflux uropathy: Secondary | ICD-10-CM | POA: Diagnosis not present

## 2020-07-11 DIAGNOSIS — Z9181 History of falling: Secondary | ICD-10-CM | POA: Diagnosis not present

## 2020-07-11 DIAGNOSIS — E7849 Other hyperlipidemia: Secondary | ICD-10-CM | POA: Diagnosis not present

## 2020-07-11 DIAGNOSIS — K5900 Constipation, unspecified: Secondary | ICD-10-CM | POA: Diagnosis not present

## 2020-07-11 DIAGNOSIS — M10071 Idiopathic gout, right ankle and foot: Secondary | ICD-10-CM | POA: Diagnosis not present

## 2020-07-11 DIAGNOSIS — N1330 Unspecified hydronephrosis: Secondary | ICD-10-CM | POA: Diagnosis not present

## 2020-07-11 DIAGNOSIS — Z86711 Personal history of pulmonary embolism: Secondary | ICD-10-CM | POA: Diagnosis not present

## 2020-07-11 DIAGNOSIS — N134 Hydroureter: Secondary | ICD-10-CM | POA: Diagnosis not present

## 2020-07-11 DIAGNOSIS — Z7901 Long term (current) use of anticoagulants: Secondary | ICD-10-CM | POA: Diagnosis not present

## 2020-07-11 DIAGNOSIS — I129 Hypertensive chronic kidney disease with stage 1 through stage 4 chronic kidney disease, or unspecified chronic kidney disease: Secondary | ICD-10-CM | POA: Diagnosis not present

## 2020-07-11 DIAGNOSIS — N401 Enlarged prostate with lower urinary tract symptoms: Secondary | ICD-10-CM | POA: Diagnosis not present

## 2020-07-11 DIAGNOSIS — D631 Anemia in chronic kidney disease: Secondary | ICD-10-CM | POA: Diagnosis not present

## 2020-07-11 DIAGNOSIS — K8020 Calculus of gallbladder without cholecystitis without obstruction: Secondary | ICD-10-CM | POA: Diagnosis not present

## 2020-07-16 DIAGNOSIS — Z86718 Personal history of other venous thrombosis and embolism: Secondary | ICD-10-CM | POA: Diagnosis not present

## 2020-07-16 DIAGNOSIS — M10072 Idiopathic gout, left ankle and foot: Secondary | ICD-10-CM | POA: Diagnosis not present

## 2020-07-16 DIAGNOSIS — N138 Other obstructive and reflux uropathy: Secondary | ICD-10-CM | POA: Diagnosis not present

## 2020-07-16 DIAGNOSIS — I129 Hypertensive chronic kidney disease with stage 1 through stage 4 chronic kidney disease, or unspecified chronic kidney disease: Secondary | ICD-10-CM | POA: Diagnosis not present

## 2020-07-16 DIAGNOSIS — Z7984 Long term (current) use of oral hypoglycemic drugs: Secondary | ICD-10-CM | POA: Diagnosis not present

## 2020-07-16 DIAGNOSIS — M10071 Idiopathic gout, right ankle and foot: Secondary | ICD-10-CM | POA: Diagnosis not present

## 2020-07-16 DIAGNOSIS — Z9181 History of falling: Secondary | ICD-10-CM | POA: Diagnosis not present

## 2020-07-16 DIAGNOSIS — E1122 Type 2 diabetes mellitus with diabetic chronic kidney disease: Secondary | ICD-10-CM | POA: Diagnosis not present

## 2020-07-16 DIAGNOSIS — N281 Cyst of kidney, acquired: Secondary | ICD-10-CM | POA: Diagnosis not present

## 2020-07-16 DIAGNOSIS — Z7901 Long term (current) use of anticoagulants: Secondary | ICD-10-CM | POA: Diagnosis not present

## 2020-07-16 DIAGNOSIS — Z87891 Personal history of nicotine dependence: Secondary | ICD-10-CM | POA: Diagnosis not present

## 2020-07-16 DIAGNOSIS — N401 Enlarged prostate with lower urinary tract symptoms: Secondary | ICD-10-CM | POA: Diagnosis not present

## 2020-07-16 DIAGNOSIS — Z466 Encounter for fitting and adjustment of urinary device: Secondary | ICD-10-CM | POA: Diagnosis not present

## 2020-07-16 DIAGNOSIS — K5900 Constipation, unspecified: Secondary | ICD-10-CM | POA: Diagnosis not present

## 2020-07-16 DIAGNOSIS — S81001D Unspecified open wound, right knee, subsequent encounter: Secondary | ICD-10-CM | POA: Diagnosis not present

## 2020-07-16 DIAGNOSIS — N134 Hydroureter: Secondary | ICD-10-CM | POA: Diagnosis not present

## 2020-07-16 DIAGNOSIS — Z86711 Personal history of pulmonary embolism: Secondary | ICD-10-CM | POA: Diagnosis not present

## 2020-07-16 DIAGNOSIS — E7849 Other hyperlipidemia: Secondary | ICD-10-CM | POA: Diagnosis not present

## 2020-07-16 DIAGNOSIS — K8020 Calculus of gallbladder without cholecystitis without obstruction: Secondary | ICD-10-CM | POA: Diagnosis not present

## 2020-07-16 DIAGNOSIS — N1832 Chronic kidney disease, stage 3b: Secondary | ICD-10-CM | POA: Diagnosis not present

## 2020-07-16 DIAGNOSIS — N1330 Unspecified hydronephrosis: Secondary | ICD-10-CM | POA: Diagnosis not present

## 2020-07-16 DIAGNOSIS — Z86010 Personal history of colonic polyps: Secondary | ICD-10-CM | POA: Diagnosis not present

## 2020-07-16 DIAGNOSIS — D631 Anemia in chronic kidney disease: Secondary | ICD-10-CM | POA: Diagnosis not present

## 2020-07-24 DIAGNOSIS — N134 Hydroureter: Secondary | ICD-10-CM | POA: Diagnosis not present

## 2020-07-24 DIAGNOSIS — N281 Cyst of kidney, acquired: Secondary | ICD-10-CM | POA: Diagnosis not present

## 2020-07-24 DIAGNOSIS — Z86010 Personal history of colonic polyps: Secondary | ICD-10-CM | POA: Diagnosis not present

## 2020-07-24 DIAGNOSIS — Z9181 History of falling: Secondary | ICD-10-CM | POA: Diagnosis not present

## 2020-07-24 DIAGNOSIS — Z87891 Personal history of nicotine dependence: Secondary | ICD-10-CM | POA: Diagnosis not present

## 2020-07-24 DIAGNOSIS — Z466 Encounter for fitting and adjustment of urinary device: Secondary | ICD-10-CM | POA: Diagnosis not present

## 2020-07-24 DIAGNOSIS — N1832 Chronic kidney disease, stage 3b: Secondary | ICD-10-CM | POA: Diagnosis not present

## 2020-07-24 DIAGNOSIS — Z86718 Personal history of other venous thrombosis and embolism: Secondary | ICD-10-CM | POA: Diagnosis not present

## 2020-07-24 DIAGNOSIS — N1330 Unspecified hydronephrosis: Secondary | ICD-10-CM | POA: Diagnosis not present

## 2020-07-24 DIAGNOSIS — K5900 Constipation, unspecified: Secondary | ICD-10-CM | POA: Diagnosis not present

## 2020-07-24 DIAGNOSIS — E7849 Other hyperlipidemia: Secondary | ICD-10-CM | POA: Diagnosis not present

## 2020-07-24 DIAGNOSIS — Z7984 Long term (current) use of oral hypoglycemic drugs: Secondary | ICD-10-CM | POA: Diagnosis not present

## 2020-07-24 DIAGNOSIS — M10072 Idiopathic gout, left ankle and foot: Secondary | ICD-10-CM | POA: Diagnosis not present

## 2020-07-24 DIAGNOSIS — S81001D Unspecified open wound, right knee, subsequent encounter: Secondary | ICD-10-CM | POA: Diagnosis not present

## 2020-07-24 DIAGNOSIS — D631 Anemia in chronic kidney disease: Secondary | ICD-10-CM | POA: Diagnosis not present

## 2020-07-24 DIAGNOSIS — N138 Other obstructive and reflux uropathy: Secondary | ICD-10-CM | POA: Diagnosis not present

## 2020-07-24 DIAGNOSIS — K8020 Calculus of gallbladder without cholecystitis without obstruction: Secondary | ICD-10-CM | POA: Diagnosis not present

## 2020-07-24 DIAGNOSIS — I129 Hypertensive chronic kidney disease with stage 1 through stage 4 chronic kidney disease, or unspecified chronic kidney disease: Secondary | ICD-10-CM | POA: Diagnosis not present

## 2020-07-24 DIAGNOSIS — Z7901 Long term (current) use of anticoagulants: Secondary | ICD-10-CM | POA: Diagnosis not present

## 2020-07-24 DIAGNOSIS — E1122 Type 2 diabetes mellitus with diabetic chronic kidney disease: Secondary | ICD-10-CM | POA: Diagnosis not present

## 2020-07-24 DIAGNOSIS — M10071 Idiopathic gout, right ankle and foot: Secondary | ICD-10-CM | POA: Diagnosis not present

## 2020-07-24 DIAGNOSIS — Z86711 Personal history of pulmonary embolism: Secondary | ICD-10-CM | POA: Diagnosis not present

## 2020-07-24 DIAGNOSIS — N401 Enlarged prostate with lower urinary tract symptoms: Secondary | ICD-10-CM | POA: Diagnosis not present

## 2020-07-30 DIAGNOSIS — N281 Cyst of kidney, acquired: Secondary | ICD-10-CM | POA: Diagnosis not present

## 2020-07-30 DIAGNOSIS — N138 Other obstructive and reflux uropathy: Secondary | ICD-10-CM | POA: Diagnosis not present

## 2020-07-30 DIAGNOSIS — K5900 Constipation, unspecified: Secondary | ICD-10-CM | POA: Diagnosis not present

## 2020-07-30 DIAGNOSIS — E7849 Other hyperlipidemia: Secondary | ICD-10-CM | POA: Diagnosis not present

## 2020-07-30 DIAGNOSIS — M10072 Idiopathic gout, left ankle and foot: Secondary | ICD-10-CM | POA: Diagnosis not present

## 2020-07-30 DIAGNOSIS — Z87891 Personal history of nicotine dependence: Secondary | ICD-10-CM | POA: Diagnosis not present

## 2020-07-30 DIAGNOSIS — N1330 Unspecified hydronephrosis: Secondary | ICD-10-CM | POA: Diagnosis not present

## 2020-07-30 DIAGNOSIS — Z86718 Personal history of other venous thrombosis and embolism: Secondary | ICD-10-CM | POA: Diagnosis not present

## 2020-07-30 DIAGNOSIS — Z7984 Long term (current) use of oral hypoglycemic drugs: Secondary | ICD-10-CM | POA: Diagnosis not present

## 2020-07-30 DIAGNOSIS — N134 Hydroureter: Secondary | ICD-10-CM | POA: Diagnosis not present

## 2020-07-30 DIAGNOSIS — K8020 Calculus of gallbladder without cholecystitis without obstruction: Secondary | ICD-10-CM | POA: Diagnosis not present

## 2020-07-30 DIAGNOSIS — S81001D Unspecified open wound, right knee, subsequent encounter: Secondary | ICD-10-CM | POA: Diagnosis not present

## 2020-07-30 DIAGNOSIS — Z86711 Personal history of pulmonary embolism: Secondary | ICD-10-CM | POA: Diagnosis not present

## 2020-07-30 DIAGNOSIS — Z466 Encounter for fitting and adjustment of urinary device: Secondary | ICD-10-CM | POA: Diagnosis not present

## 2020-07-30 DIAGNOSIS — N401 Enlarged prostate with lower urinary tract symptoms: Secondary | ICD-10-CM | POA: Diagnosis not present

## 2020-07-30 DIAGNOSIS — I129 Hypertensive chronic kidney disease with stage 1 through stage 4 chronic kidney disease, or unspecified chronic kidney disease: Secondary | ICD-10-CM | POA: Diagnosis not present

## 2020-07-30 DIAGNOSIS — Z7901 Long term (current) use of anticoagulants: Secondary | ICD-10-CM | POA: Diagnosis not present

## 2020-07-30 DIAGNOSIS — D631 Anemia in chronic kidney disease: Secondary | ICD-10-CM | POA: Diagnosis not present

## 2020-07-30 DIAGNOSIS — N1832 Chronic kidney disease, stage 3b: Secondary | ICD-10-CM | POA: Diagnosis not present

## 2020-07-30 DIAGNOSIS — M10071 Idiopathic gout, right ankle and foot: Secondary | ICD-10-CM | POA: Diagnosis not present

## 2020-07-30 DIAGNOSIS — Z86010 Personal history of colonic polyps: Secondary | ICD-10-CM | POA: Diagnosis not present

## 2020-07-30 DIAGNOSIS — E1122 Type 2 diabetes mellitus with diabetic chronic kidney disease: Secondary | ICD-10-CM | POA: Diagnosis not present

## 2020-07-30 DIAGNOSIS — Z9181 History of falling: Secondary | ICD-10-CM | POA: Diagnosis not present

## 2020-07-31 DIAGNOSIS — K8020 Calculus of gallbladder without cholecystitis without obstruction: Secondary | ICD-10-CM | POA: Diagnosis not present

## 2020-07-31 DIAGNOSIS — Z86711 Personal history of pulmonary embolism: Secondary | ICD-10-CM | POA: Diagnosis not present

## 2020-07-31 DIAGNOSIS — Z466 Encounter for fitting and adjustment of urinary device: Secondary | ICD-10-CM | POA: Diagnosis not present

## 2020-07-31 DIAGNOSIS — Z86010 Personal history of colonic polyps: Secondary | ICD-10-CM | POA: Diagnosis not present

## 2020-07-31 DIAGNOSIS — N401 Enlarged prostate with lower urinary tract symptoms: Secondary | ICD-10-CM | POA: Diagnosis not present

## 2020-07-31 DIAGNOSIS — N138 Other obstructive and reflux uropathy: Secondary | ICD-10-CM | POA: Diagnosis not present

## 2020-07-31 DIAGNOSIS — E7849 Other hyperlipidemia: Secondary | ICD-10-CM | POA: Diagnosis not present

## 2020-07-31 DIAGNOSIS — Z9181 History of falling: Secondary | ICD-10-CM | POA: Diagnosis not present

## 2020-07-31 DIAGNOSIS — N134 Hydroureter: Secondary | ICD-10-CM | POA: Diagnosis not present

## 2020-07-31 DIAGNOSIS — N1832 Chronic kidney disease, stage 3b: Secondary | ICD-10-CM | POA: Diagnosis not present

## 2020-07-31 DIAGNOSIS — S81001D Unspecified open wound, right knee, subsequent encounter: Secondary | ICD-10-CM | POA: Diagnosis not present

## 2020-07-31 DIAGNOSIS — K5900 Constipation, unspecified: Secondary | ICD-10-CM | POA: Diagnosis not present

## 2020-07-31 DIAGNOSIS — D631 Anemia in chronic kidney disease: Secondary | ICD-10-CM | POA: Diagnosis not present

## 2020-07-31 DIAGNOSIS — E1122 Type 2 diabetes mellitus with diabetic chronic kidney disease: Secondary | ICD-10-CM | POA: Diagnosis not present

## 2020-07-31 DIAGNOSIS — M10072 Idiopathic gout, left ankle and foot: Secondary | ICD-10-CM | POA: Diagnosis not present

## 2020-07-31 DIAGNOSIS — N1330 Unspecified hydronephrosis: Secondary | ICD-10-CM | POA: Diagnosis not present

## 2020-07-31 DIAGNOSIS — Z7901 Long term (current) use of anticoagulants: Secondary | ICD-10-CM | POA: Diagnosis not present

## 2020-07-31 DIAGNOSIS — N281 Cyst of kidney, acquired: Secondary | ICD-10-CM | POA: Diagnosis not present

## 2020-07-31 DIAGNOSIS — Z86718 Personal history of other venous thrombosis and embolism: Secondary | ICD-10-CM | POA: Diagnosis not present

## 2020-07-31 DIAGNOSIS — M10071 Idiopathic gout, right ankle and foot: Secondary | ICD-10-CM | POA: Diagnosis not present

## 2020-07-31 DIAGNOSIS — Z7984 Long term (current) use of oral hypoglycemic drugs: Secondary | ICD-10-CM | POA: Diagnosis not present

## 2020-07-31 DIAGNOSIS — Z87891 Personal history of nicotine dependence: Secondary | ICD-10-CM | POA: Diagnosis not present

## 2020-07-31 DIAGNOSIS — I129 Hypertensive chronic kidney disease with stage 1 through stage 4 chronic kidney disease, or unspecified chronic kidney disease: Secondary | ICD-10-CM | POA: Diagnosis not present

## 2020-08-01 DIAGNOSIS — R338 Other retention of urine: Secondary | ICD-10-CM | POA: Diagnosis not present

## 2020-08-07 ENCOUNTER — Ambulatory Visit: Payer: PPO | Admitting: Podiatry

## 2020-08-07 DIAGNOSIS — M25572 Pain in left ankle and joints of left foot: Secondary | ICD-10-CM | POA: Diagnosis not present

## 2020-08-07 DIAGNOSIS — M7989 Other specified soft tissue disorders: Secondary | ICD-10-CM | POA: Diagnosis not present

## 2020-08-07 DIAGNOSIS — M79672 Pain in left foot: Secondary | ICD-10-CM | POA: Diagnosis not present

## 2020-08-08 DIAGNOSIS — N281 Cyst of kidney, acquired: Secondary | ICD-10-CM | POA: Diagnosis not present

## 2020-08-08 DIAGNOSIS — Z7901 Long term (current) use of anticoagulants: Secondary | ICD-10-CM | POA: Diagnosis not present

## 2020-08-08 DIAGNOSIS — M10072 Idiopathic gout, left ankle and foot: Secondary | ICD-10-CM | POA: Diagnosis not present

## 2020-08-08 DIAGNOSIS — Z9181 History of falling: Secondary | ICD-10-CM | POA: Diagnosis not present

## 2020-08-08 DIAGNOSIS — Z86718 Personal history of other venous thrombosis and embolism: Secondary | ICD-10-CM | POA: Diagnosis not present

## 2020-08-08 DIAGNOSIS — M10071 Idiopathic gout, right ankle and foot: Secondary | ICD-10-CM | POA: Diagnosis not present

## 2020-08-08 DIAGNOSIS — Z86711 Personal history of pulmonary embolism: Secondary | ICD-10-CM | POA: Diagnosis not present

## 2020-08-08 DIAGNOSIS — N401 Enlarged prostate with lower urinary tract symptoms: Secondary | ICD-10-CM | POA: Diagnosis not present

## 2020-08-08 DIAGNOSIS — D631 Anemia in chronic kidney disease: Secondary | ICD-10-CM | POA: Diagnosis not present

## 2020-08-08 DIAGNOSIS — N1832 Chronic kidney disease, stage 3b: Secondary | ICD-10-CM | POA: Diagnosis not present

## 2020-08-08 DIAGNOSIS — N139 Obstructive and reflux uropathy, unspecified: Secondary | ICD-10-CM | POA: Diagnosis not present

## 2020-08-08 DIAGNOSIS — N1330 Unspecified hydronephrosis: Secondary | ICD-10-CM | POA: Diagnosis not present

## 2020-08-08 DIAGNOSIS — E7849 Other hyperlipidemia: Secondary | ICD-10-CM | POA: Diagnosis not present

## 2020-08-08 DIAGNOSIS — I129 Hypertensive chronic kidney disease with stage 1 through stage 4 chronic kidney disease, or unspecified chronic kidney disease: Secondary | ICD-10-CM | POA: Diagnosis not present

## 2020-08-08 DIAGNOSIS — N138 Other obstructive and reflux uropathy: Secondary | ICD-10-CM | POA: Diagnosis not present

## 2020-08-08 DIAGNOSIS — Z87891 Personal history of nicotine dependence: Secondary | ICD-10-CM | POA: Diagnosis not present

## 2020-08-08 DIAGNOSIS — E1122 Type 2 diabetes mellitus with diabetic chronic kidney disease: Secondary | ICD-10-CM | POA: Diagnosis not present

## 2020-08-08 DIAGNOSIS — K8020 Calculus of gallbladder without cholecystitis without obstruction: Secondary | ICD-10-CM | POA: Diagnosis not present

## 2020-08-08 DIAGNOSIS — Z86010 Personal history of colonic polyps: Secondary | ICD-10-CM | POA: Diagnosis not present

## 2020-08-08 DIAGNOSIS — S81001D Unspecified open wound, right knee, subsequent encounter: Secondary | ICD-10-CM | POA: Diagnosis not present

## 2020-08-08 DIAGNOSIS — N189 Chronic kidney disease, unspecified: Secondary | ICD-10-CM | POA: Diagnosis not present

## 2020-08-08 DIAGNOSIS — K5900 Constipation, unspecified: Secondary | ICD-10-CM | POA: Diagnosis not present

## 2020-08-08 DIAGNOSIS — M109 Gout, unspecified: Secondary | ICD-10-CM | POA: Diagnosis not present

## 2020-08-08 DIAGNOSIS — N134 Hydroureter: Secondary | ICD-10-CM | POA: Diagnosis not present

## 2020-08-08 DIAGNOSIS — Z466 Encounter for fitting and adjustment of urinary device: Secondary | ICD-10-CM | POA: Diagnosis not present

## 2020-08-08 DIAGNOSIS — Z7984 Long term (current) use of oral hypoglycemic drugs: Secondary | ICD-10-CM | POA: Diagnosis not present

## 2020-08-16 ENCOUNTER — Other Ambulatory Visit: Payer: Self-pay

## 2020-08-16 ENCOUNTER — Inpatient Hospital Stay (HOSPITAL_COMMUNITY)
Admission: EM | Admit: 2020-08-16 | Discharge: 2020-08-19 | DRG: 698 | Disposition: A | Discharge status: Home / Self Care | Payer: PPO | Attending: Internal Medicine | Admitting: Internal Medicine

## 2020-08-16 ENCOUNTER — Emergency Department (HOSPITAL_COMMUNITY): Payer: PPO

## 2020-08-16 DIAGNOSIS — Z23 Encounter for immunization: Secondary | ICD-10-CM

## 2020-08-16 DIAGNOSIS — T83511A Infection and inflammatory reaction due to indwelling urethral catheter, initial encounter: Principal | ICD-10-CM | POA: Diagnosis present

## 2020-08-16 DIAGNOSIS — R6520 Severe sepsis without septic shock: Secondary | ICD-10-CM | POA: Diagnosis present

## 2020-08-16 DIAGNOSIS — N400 Enlarged prostate without lower urinary tract symptoms: Secondary | ICD-10-CM | POA: Diagnosis present

## 2020-08-16 DIAGNOSIS — N179 Acute kidney failure, unspecified: Secondary | ICD-10-CM | POA: Diagnosis not present

## 2020-08-16 DIAGNOSIS — Z20822 Contact with and (suspected) exposure to covid-19: Secondary | ICD-10-CM | POA: Diagnosis present

## 2020-08-16 DIAGNOSIS — N136 Pyonephrosis: Secondary | ICD-10-CM | POA: Diagnosis not present

## 2020-08-16 DIAGNOSIS — R4182 Altered mental status, unspecified: Secondary | ICD-10-CM | POA: Diagnosis not present

## 2020-08-16 DIAGNOSIS — E785 Hyperlipidemia, unspecified: Secondary | ICD-10-CM | POA: Diagnosis present

## 2020-08-16 DIAGNOSIS — N182 Chronic kidney disease, stage 2 (mild): Secondary | ICD-10-CM | POA: Diagnosis not present

## 2020-08-16 DIAGNOSIS — I129 Hypertensive chronic kidney disease with stage 1 through stage 4 chronic kidney disease, or unspecified chronic kidney disease: Secondary | ICD-10-CM | POA: Diagnosis present

## 2020-08-16 DIAGNOSIS — I10 Essential (primary) hypertension: Secondary | ICD-10-CM | POA: Diagnosis present

## 2020-08-16 DIAGNOSIS — Z7901 Long term (current) use of anticoagulants: Secondary | ICD-10-CM | POA: Diagnosis not present

## 2020-08-16 DIAGNOSIS — Z79899 Other long term (current) drug therapy: Secondary | ICD-10-CM | POA: Diagnosis not present

## 2020-08-16 DIAGNOSIS — R0689 Other abnormalities of breathing: Secondary | ICD-10-CM | POA: Diagnosis not present

## 2020-08-16 DIAGNOSIS — A419 Sepsis, unspecified organism: Secondary | ICD-10-CM | POA: Diagnosis not present

## 2020-08-16 DIAGNOSIS — E119 Type 2 diabetes mellitus without complications: Secondary | ICD-10-CM

## 2020-08-16 DIAGNOSIS — N390 Urinary tract infection, site not specified: Secondary | ICD-10-CM | POA: Diagnosis not present

## 2020-08-16 DIAGNOSIS — Z7984 Long term (current) use of oral hypoglycemic drugs: Secondary | ICD-10-CM

## 2020-08-16 DIAGNOSIS — E876 Hypokalemia: Secondary | ICD-10-CM | POA: Diagnosis not present

## 2020-08-16 DIAGNOSIS — E1122 Type 2 diabetes mellitus with diabetic chronic kidney disease: Secondary | ICD-10-CM | POA: Diagnosis present

## 2020-08-16 DIAGNOSIS — Z86711 Personal history of pulmonary embolism: Secondary | ICD-10-CM | POA: Diagnosis not present

## 2020-08-16 DIAGNOSIS — Z87891 Personal history of nicotine dependence: Secondary | ICD-10-CM

## 2020-08-16 DIAGNOSIS — R0902 Hypoxemia: Secondary | ICD-10-CM | POA: Diagnosis not present

## 2020-08-16 DIAGNOSIS — R000 Tachycardia, unspecified: Secondary | ICD-10-CM | POA: Diagnosis not present

## 2020-08-16 DIAGNOSIS — R509 Fever, unspecified: Secondary | ICD-10-CM | POA: Diagnosis not present

## 2020-08-16 DIAGNOSIS — Z978 Presence of other specified devices: Secondary | ICD-10-CM

## 2020-08-16 LAB — CBC WITH DIFFERENTIAL/PLATELET
Abs Immature Granulocytes: 0 10*3/uL (ref 0.00–0.07)
Basophils Absolute: 0 10*3/uL (ref 0.0–0.1)
Basophils Relative: 0 %
Eosinophils Absolute: 0 10*3/uL (ref 0.0–0.5)
Eosinophils Relative: 0 %
HCT: 35.8 % — ABNORMAL LOW (ref 39.0–52.0)
Hemoglobin: 11.3 g/dL — ABNORMAL LOW (ref 13.0–17.0)
Lymphocytes Relative: 3 %
Lymphs Abs: 0.6 10*3/uL — ABNORMAL LOW (ref 0.7–4.0)
MCH: 27.2 pg (ref 26.0–34.0)
MCHC: 31.6 g/dL (ref 30.0–36.0)
MCV: 86.1 fL (ref 80.0–100.0)
Monocytes Absolute: 0.2 10*3/uL (ref 0.1–1.0)
Monocytes Relative: 1 %
Neutro Abs: 20.2 10*3/uL — ABNORMAL HIGH (ref 1.7–7.7)
Neutrophils Relative %: 96 %
Platelets: 183 10*3/uL (ref 150–400)
RBC: 4.16 MIL/uL — ABNORMAL LOW (ref 4.22–5.81)
RDW: 15.1 % (ref 11.5–15.5)
WBC: 21 10*3/uL — ABNORMAL HIGH (ref 4.0–10.5)
nRBC: 0 % (ref 0.0–0.2)
nRBC: 0 /100 WBC

## 2020-08-16 LAB — RESPIRATORY PANEL BY RT PCR (FLU A&B, COVID)
Influenza A by PCR: NEGATIVE
Influenza B by PCR: NEGATIVE
SARS Coronavirus 2 by RT PCR: NEGATIVE

## 2020-08-16 LAB — COMPREHENSIVE METABOLIC PANEL
ALT: 22 U/L (ref 0–44)
AST: 44 U/L — ABNORMAL HIGH (ref 15–41)
Albumin: 2.3 g/dL — ABNORMAL LOW (ref 3.5–5.0)
Alkaline Phosphatase: 87 U/L (ref 38–126)
Anion gap: 14 (ref 5–15)
BUN: 50 mg/dL — ABNORMAL HIGH (ref 8–23)
CO2: 22 mmol/L (ref 22–32)
Calcium: 8.7 mg/dL — ABNORMAL LOW (ref 8.9–10.3)
Chloride: 102 mmol/L (ref 98–111)
Creatinine, Ser: 3.14 mg/dL — ABNORMAL HIGH (ref 0.61–1.24)
GFR, Estimated: 19 mL/min — ABNORMAL LOW (ref >60)
Glucose, Bld: 179 mg/dL — ABNORMAL HIGH (ref 70–99)
Potassium: 3.9 mmol/L (ref 3.5–5.1)
Sodium: 138 mmol/L (ref 135–145)
Total Bilirubin: 1.4 mg/dL — ABNORMAL HIGH (ref 0.3–1.2)
Total Protein: 7.1 g/dL (ref 6.5–8.1)

## 2020-08-16 LAB — I-STAT VENOUS BLOOD GAS, ED
Acid-Base Excess: 0 mmol/L (ref 0.0–2.0)
Bicarbonate: 24.8 mmol/L (ref 20.0–28.0)
Calcium, Ion: 1.1 mmol/L — ABNORMAL LOW (ref 1.15–1.40)
HCT: 30 % — ABNORMAL LOW (ref 39.0–52.0)
Hemoglobin: 10.2 g/dL — ABNORMAL LOW (ref 13.0–17.0)
O2 Saturation: 53 %
Potassium: 3.7 mmol/L (ref 3.5–5.1)
Sodium: 140 mmol/L (ref 135–145)
TCO2: 26 mmol/L (ref 22–32)
pCO2, Ven: 38.6 mmHg — ABNORMAL LOW (ref 44.0–60.0)
pH, Ven: 7.416 (ref 7.250–7.430)
pO2, Ven: 27 mmHg — CL (ref 32.0–45.0)

## 2020-08-16 LAB — APTT: aPTT: 44 seconds — ABNORMAL HIGH (ref 24–36)

## 2020-08-16 LAB — URINALYSIS, ROUTINE W REFLEX MICROSCOPIC
Bilirubin Urine: NEGATIVE
Glucose, UA: NEGATIVE mg/dL
Ketones, ur: NEGATIVE mg/dL
Nitrite: NEGATIVE
Protein, ur: 100 mg/dL — AB
Specific Gravity, Urine: 1.008 (ref 1.005–1.030)
WBC, UA: >50 WBC/hpf — ABNORMAL HIGH (ref 0–5)
pH: 5 (ref 5.0–8.0)

## 2020-08-16 LAB — AMMONIA: Ammonia: 18 umol/L (ref 9–35)

## 2020-08-16 LAB — LACTIC ACID, PLASMA
Lactic Acid, Venous: 3.1 mmol/L (ref 0.5–1.9)
Lactic Acid, Venous: 2.9 mmol/L (ref 0.5–1.9)

## 2020-08-16 LAB — PROTIME-INR
INR: 2.3 — ABNORMAL HIGH (ref 0.8–1.2)
Prothrombin Time: 24.2 seconds — ABNORMAL HIGH (ref 11.4–15.2)

## 2020-08-16 LAB — TSH: TSH: 3.185 u[IU]/mL (ref 0.350–4.500)

## 2020-08-16 MED ORDER — SODIUM CHLORIDE 0.9% FLUSH
3.0000 mL | Freq: Two times a day (BID) | INTRAVENOUS | Status: DC
  Administered 2020-08-17 – 2020-08-19 (×3): 3 mL via INTRAVENOUS

## 2020-08-16 MED ORDER — LACTATED RINGERS IV SOLN: INTRAVENOUS | Status: AC

## 2020-08-16 MED ORDER — LACTATED RINGERS IV BOLUS (SEPSIS)
1000.0000 mL | Freq: Once | INTRAVENOUS | Status: AC
  Administered 2020-08-16: 1000 mL via INTRAVENOUS

## 2020-08-16 MED ORDER — ACETAMINOPHEN 650 MG RE SUPP
650.0000 mg | RECTAL | Status: DC | PRN
  Administered 2020-08-16: 650 mg via RECTAL
  Filled 2020-08-16: qty 1

## 2020-08-16 MED ORDER — VANCOMYCIN VARIABLE DOSE PER UNSTABLE RENAL FUNCTION (PHARMACIST DOSING): Status: DC

## 2020-08-16 MED ORDER — APIXABAN 5 MG PO TABS
5.0000 mg | ORAL_TABLET | Freq: Two times a day (BID) | ORAL | Status: DC
  Administered 2020-08-17 – 2020-08-19 (×5): 5 mg via ORAL
  Filled 2020-08-16 (×5): qty 1

## 2020-08-16 MED ORDER — VANCOMYCIN HCL IN DEXTROSE 1-5 GM/200ML-% IV SOLN: 1000.0000 mg | Freq: Once | INTRAVENOUS | Status: DC

## 2020-08-16 MED ORDER — TAMSULOSIN HCL 0.4 MG PO CAPS
0.4000 mg | ORAL_CAPSULE | Freq: Every day | ORAL | Status: DC
  Administered 2020-08-17 – 2020-08-19 (×3): 0.4 mg via ORAL
  Filled 2020-08-16 (×3): qty 1

## 2020-08-16 MED ORDER — VANCOMYCIN HCL 1250 MG/250ML IV SOLN
1250.0000 mg | Freq: Once | INTRAVENOUS | Status: AC
  Administered 2020-08-16: 1250 mg via INTRAVENOUS
  Filled 2020-08-16: qty 250

## 2020-08-16 MED ORDER — METRONIDAZOLE IN NACL 5-0.79 MG/ML-% IV SOLN: 500.0000 mg | Freq: Once | INTRAVENOUS | Status: DC

## 2020-08-16 MED ORDER — SODIUM CHLORIDE 0.9 % IV SOLN
2.0000 g | INTRAVENOUS | Status: DC
  Administered 2020-08-17 – 2020-08-18 (×2): 2 g via INTRAVENOUS
  Filled 2020-08-16 (×2): qty 2

## 2020-08-16 MED ORDER — SODIUM CHLORIDE 0.9 % IV SOLN
2.0000 g | Freq: Once | INTRAVENOUS | Status: AC
  Administered 2020-08-16: 2 g via INTRAVENOUS
  Filled 2020-08-16: qty 2

## 2020-08-16 MED ORDER — FINASTERIDE 5 MG PO TABS
5.0000 mg | ORAL_TABLET | Freq: Every day | ORAL | Status: DC
  Administered 2020-08-17 – 2020-08-19 (×3): 5 mg via ORAL
  Filled 2020-08-16 (×3): qty 1

## 2020-08-16 NOTE — Progress Notes (Signed)
Pharmacy Antibiotic Note  Sergio [name] is a 86 y.o. male admitted on 08/16/2020 with sepsis.  Pharmacy has been consulted for Cefepime and Vancomycin dosing.   Height: 5' (152.4 cm) Weight: 64.7 kg (142 lb 10.2 oz) IBW/kg (Calculated) : 50  Temp (24hrs), Avg:101.8 F (38.8 C), Min:100.5 F (38.1 C), Max:103 F (39.4 C)  Recent Labs  Lab 08/16/20 1945  WBC 21.0*  CREATININE 3.14*  LATICACIDVEN 3.1*    Estimated Creatinine Clearance: 13.6 mL/min (A) (by C-G formula based on SCr of 3.14 mg/dL (H)).    No Known Allergies  Antimicrobials this admission: 11/19 Cefepime >>  11/19 Vancomycin >>   Dose adjustments this admission: N/a  Microbiology results: Pending   Plan:  - Cefepime 2g IV q24h - Vancomycin 1250mg  IV x 1 dose  - Will dose vancomycin by random levels due to AKI scr 3.14 baseline ~1.4 - Monitor patients renal function and urine output  - De-escalate ABX when appropriate   Thank you for allowing pharmacy to be a part of this patient's care.  Duanne Limerick PharmD. BCPS 08/16/2020 9:23 PM

## 2020-08-16 NOTE — ED Triage Notes (Addendum)
Pt to ED from home r/t to [facility]. Family called EMS for confusion. Pt can usually converse and answer questions appropriately.  Pt oriented to self only. Only answering yes/no questions at this time.  Pt has indwelling catheter. Pt febrile on arrival.

## 2020-08-16 NOTE — ED Provider Notes (Signed)
[facility] EMERGENCY DEPARTMENT Provider Note   CSN: [id number] Arrival date & time: 08/16/20  1829     History No chief complaint on file.   Sergio [name] [name] a 86 y.o. male with history of PE, hypertension, diabetes who presents with altered mental status.  History [name] limited as patient [name] only able to answer yes/no questions and [name] unable to follow all commands or questions.  On reassessment, Sergio [name] at bedside provided further with limited history.  Per Sergio [name], he has been altered over the past few days and his Sergio [name] found him lying on the ground as if he slept on the ground overnight.  The history [name] provided by a relative. The history [name] limited by the condition of the patient.  Altered Mental Status Presenting symptoms: confusion   Severity:  Moderate Episode history:  Continuous Timing:  Constant Progression:  Worsening Chronicity:  New Context: not homeless and not recent illness        Past Medical History:  Diagnosis Date  . Colon polyps 06/22/2014   4 colon polyps by colonoscopy.  Repeat 3 years.  Hung.  . Diabetes mellitus   . Hypertension   . Other and unspecified hyperlipidemia   . PE (pulmonary embolism)     Patient Active Problem List   Diagnosis Date Noted  . AKI (acute kidney injury) ([place]) 06/30/2020  . Hydronephrosis 06/30/2020  . Pain due to onychomycosis of toenails of both feet 10/13/2019  . Urinary frequency 10/01/2017  . Type 2 diabetes mellitus without complication, without long-term current use of insulin ([place]) 10/01/2017  . Loss of weight 03/24/2017  . Hypoglycemia 03/24/2017  . Iatrogenic hypotension 03/24/2017  . Nonspecific abnormal electrocardiogram (ECG) (EKG) 02/02/2017  . Colon polyps 07/04/2014  . Controlled diabetes mellitus without long-term current use of insulin ([place]) 12/18/2011  . Uncontrolled hypertension   . Other and unspecified hyperlipidemia     Past Surgical History:  Procedure Laterality Date  .  COLONOSCOPY W/ POLYPECTOMY  06/22/2014   4 colon polyps; repeat 3 years; [place].       Family History  Problem Relation Age of Onset  . Diabetes Other     Social History   Tobacco Use  . Smoking status: Former Smoker    Years: 17.00    Types: Cigars    Quit date: 09/29/1975    Years since quitting: 44.9  . Smokeless tobacco: Never Used  Substance Use Topics  . Alcohol use: No  . Drug use: No    Home Medications Prior to Admission medications   Medication Sig Start Date End Date Taking? Authorizing Provider  amLODipine (NORVASC) 10 MG tablet Take 1 tablet (10 mg total) by mouth daily. 07/06/20 09/04/20  Antonieta Pert, MD  apixaban (ELIQUIS) 5 MG TABS tablet 10 mg bid till 07/10/20 then 5 mg bid 07/06/20   Antonieta Pert, MD  finasteride (PROSCAR) 5 MG tablet Take 1 tablet (5 mg total) by mouth daily. 07/02/20 09/30/20  Alexis Frock, MD  hydrALAZINE (APRESOLINE) 25 MG tablet Take 1 tablet (25 mg total) by mouth 3 (three) times daily. 07/06/20 08/05/20  Antonieta Pert, MD  metFORMIN (GLUCOPHAGE) 500 MG tablet Take 1 tablet (500 mg total) by mouth daily with breakfast. 10/31/19   Forrest Moron, MD  ondansetron (ZOFRAN) 4 MG tablet Take 1 tablet (4 mg total) by mouth every 8 (eight) hours as needed for nausea or vomiting. 01/21/20   Hayden Rasmussen, MD  tamsulosin (FLOMAX) 0.4 MG CAPS capsule  Take 1 capsule (0.4 mg total) by mouth daily. 07/02/20   Alexis Frock, MD    Allergies    Patient has no known allergies.  Review of Systems   Review of Systems  Unable to perform ROS: Mental status change  Constitutional:       Denies chest pain, abdominal pain, headache. Otherwise unable to answer ROS questions  Psychiatric/Behavioral: Positive for confusion.    Physical Exam Updated Vital Signs There were no vitals taken for this visit.  Physical Exam Constitutional:      General: [name] in acute distress (confused, drowsy).     Appearance: [name] ill-appearing. [name] not toxic-appearing.    HENT:     Head: Normocephalic and atraumatic.     Right Ear: External ear normal.     Left Ear: External ear normal.  Eyes:     Conjunctiva/sclera: Conjunctivae normal.     Pupils: Pupils are equal, round, and reactive to light.  Cardiovascular:     Rate and Rhythm: Regular rhythm. Tachycardia present.     Heart sounds: No murmur heard.  No friction rub. No gallop.   Pulmonary:     Effort: Pulmonary effort [name] normal. No respiratory distress.     Breath sounds: Normal breath sounds. No wheezing, rhonchi or rales.  Abdominal:     General: There [name] no distension.     Palpations: Abdomen [name] soft.     Tenderness: There [name] no abdominal tenderness.  Musculoskeletal:        General: No deformity or signs of injury.  Skin:    General: Skin [name] warm and dry.  Neurological:     Mental Status: [name] alert. [name] confused.     Comments: Moving BUE, not able to follow commands to move BLE.  Neuro exam limited as patient [name] unable to follow all commands.     ED Results / Procedures / Treatments   Labs (all labs ordered are listed, but only abnormal results are displayed) Labs Reviewed - No data to display  EKG None  Radiology No results found.  Procedures Procedures (including critical care time)  Medications Ordered in ED Medications - No data to display  ED Course  I have reviewed the triage vital signs and the nursing notes.  Pertinent labs & imaging results that were available during my care of the patient were reviewed by me and considered in my medical decision making (see chart for details).    MDM Rules/Calculators/A&P                          MDM: Sergio [name] a 86 y.o. male who presents with altered mental status as per above. I have reviewed the nursing documentation for past medical history, family history, and social history. Pertinent previous records reviewed. [name] awake, alert.  Tachycardic but normotensive. Febrile to 103 F. Physical exam [name] most  notable for confused, drowsy 86 year old male who [name] able to answer yes/no questions but [name] not able to follow most commands.  Labs: VBG 7.416/38.6, UA with infection, lactic 3.1.  CMP with BUN 50, creatinine elevated to 3.14, AG 14.  INR 2.3.  WBC 21, hemoglobin 11.3.  Covid negative. EKG: Sinus tachycardia, nonspecific T wave changes.. QTc, PR, and QRS within appropriate limits. No signs of acute ischemia, infarct, or significant electrical abnormalities. No STEMI. No evidence of a High-Grade Conduction Block, WPW, Brugada Sign, ARVC, DeWinters T Waves, or Wellens Waves.  Imaging: CXR with no pneumonia.  CT head with no acute intracranial abnormality. Consults: none Tx: Given 30 cc/kg LR, 60 mg PR Tylenol, cefepime, vancomycin  Differential Dx: I am most concerned for sepsis secondary to UTI. Given history, physical exam, and work-up, I do not think he has septic shock, pneumonia, meningitis/encephalitis, bacteremia, intra-abdominal infection, Covid, thyrotoxicosis, hypercarbia, stroke, or trauma.  MDM: Sergio [name] [name] a 86 y.o. male presents with altered mentation found to have a fever of 79 F.  Patient has a chronic indwelling Foley which was exchanged for a new Foley and UA drawn from new Foley.  Frank pus from new Foley concerning for UTI as cause of sepsis.  Patient initially altered although able to answer yes/no questions.  After improvement of fever and fluids, patient now awake, talking.  Sergio [name] arrived at bedside and was updated on the plan for admission.  At time of vision, repeat lactic pending.  Hospitalist consulted for admission, handoff given, admitted.  Admitted in stable condition, on room air, awake/alert.  The plan for this patient was discussed with Dr. Vanita Panda, who voiced agreement and who oversaw evaluation and treatment of this patient.   Final Clinical Impression(s) / ED Diagnoses Final diagnoses:  None    Rx / DC Orders ED Discharge Orders    None       Talajah Slimp,  Jaylene Schrom, MD 08/16/20 2315    Carmin Muskrat, MD 08/20/20 [id number]

## 2020-08-16 NOTE — H&P (Addendum)
History and Physical   Sergio [name] KNL:[id number] DOB: 1933-03-05 DOA: 08/16/2020  PCP: Forrest Moron, MD  Patient coming from: Home  Chief Complaint: Lethargy and confusion  HPI: Sergio [name] is a 86 y.o. male with medical history significant of diabetes, BPH status post chronic Foley secondary to hydronephrosis, hyperlipidemia, PE on Eliquis who presents after 2 days of lethargy and new altered mental status.  Patient continues to have some altered mental status and is unable to fully participate in HPI history is obtained with the assistance of the patient's son and chart.  He has been somewhat altered and confused the last few days and has been more lethargic.  His son found him on the ground this morning as if he had been sleeping on the ground.  He had not complained of anything in the last several days.  When I saw patient he would say yes and no to some questions and was alert to self and would say some complete sentences.  ED Course: Patient initially tachycardic to 122, tachypneic to 24, febrile to 103; vitals have improved with fever control and IV fluids.  Lab work showed creatinine of 3.1 from a baseline of 1.5, glucose 179, calcium 8.7 which corrects considering albumin of 2.3, T bili 1.4.  CBC with leukocytosis to 21 and Hgb stable at 11.  PT PTT T and INR all elevated in the setting of Eliquis use.  TSH and ammonia within normal limits.  ABG showed normal pH.  Urinalysis was consistent with UTI and frank pus was noted in catheter bag.  Urine culture and blood cultures were obtained respiratory panel negative for flu and Covid.  Patient received 2 L IV fluids, vancomycin and cefepime in the ED.  Confusion appear to be starting to clear at the time I examined the patient.  Review of Systems: Unable to fully assess review of systems due to patient's confusion  Past Medical History:  Diagnosis Date  . Colon polyps 06/22/2014   4 colon polyps by colonoscopy.  Repeat 3 years.   Hung.  . Diabetes mellitus   . Hypertension   . Other and unspecified hyperlipidemia   . PE (pulmonary embolism)     Past Surgical History:  Procedure Laterality Date  . COLONOSCOPY W/ POLYPECTOMY  06/22/2014   4 colon polyps; repeat 3 years; [place].    Social History  reports that he quit smoking about 44 years ago. His smoking use included cigars. He quit after 17.00 years of use. He has never used smokeless tobacco. He reports that he does not drink alcohol and does not use drugs.  No Known Allergies  Family History  Problem Relation Age of Onset  . Diabetes Other   Reviewed on admission  Prior to Admission medications   Medication Sig Start Date End Date Taking? Authorizing Provider  acetaminophen (TYLENOL) 500 MG tablet Take 500 mg by mouth every 6 (six) hours as needed (pain).   Yes [provider]  amLODipine (NORVASC) 10 MG tablet Take 1 tablet (10 mg total) by mouth daily. 07/06/20 09/04/20 Yes Antonieta Pert, MD  apixaban (ELIQUIS) 5 MG TABS tablet 10 mg bid till 07/10/20 then 5 mg bid Patient taking differently: Take 5 mg by mouth 2 (two) times daily.  07/06/20  Yes Antonieta Pert, MD  finasteride (PROSCAR) 5 MG tablet Take 1 tablet (5 mg total) by mouth daily. 07/02/20 09/30/20 Yes Alexis Frock, MD  hydrALAZINE (APRESOLINE) 25 MG tablet Take 1 tablet (25 mg total)  by mouth 3 (three) times daily. 07/06/20 08/27/20 Yes Antonieta Pert, MD  ondansetron (ZOFRAN) 4 MG tablet Take 1 tablet (4 mg total) by mouth every 8 (eight) hours as needed for nausea or vomiting. 01/21/20  Yes Hayden Rasmussen, MD  tamsulosin (FLOMAX) 0.4 MG CAPS capsule Take 1 capsule (0.4 mg total) by mouth daily. 07/02/20  Yes Alexis Frock, MD  traMADol (ULTRAM) 50 MG tablet Take 50 mg by mouth every 8 (eight) hours as needed (pain).  08/05/20  Yes [provider]  metFORMIN (GLUCOPHAGE) 500 MG tablet Take 1 tablet (500 mg total) by mouth daily with breakfast. Patient not taking: Reported on 08/16/2020  10/31/19   Forrest Moron, MD    Physical Exam: Vitals:   08/16/20 2207 08/16/20 2316 08/16/20 2349 08/17/20 0011  BP: 120/68 125/69 102/72 110/64  Pulse: 97 (!) 103 (!) 107 (!) 102  Resp: 15 19 18 16   Temp:   99.3 F (37.4 C)   TempSrc:   Oral   SpO2: 100% 100% 100% 99%  Weight:      Height:       Physical Exam Constitutional:      General: He is not in acute distress.    Appearance: Normal appearance.     Comments: Elderly male  HENT:     Head: Normocephalic and atraumatic.     Mouth/Throat:     Mouth: Mucous membranes are moist.     Pharynx: Oropharynx is clear.  Eyes:     Extraocular Movements: Extraocular movements intact.     Pupils: Pupils are equal, round, and reactive to light.  Cardiovascular:     Rate and Rhythm: Normal rate and regular rhythm.     Pulses: Normal pulses.     Heart sounds: Normal heart sounds.  Pulmonary:     Effort: Pulmonary effort is normal. No respiratory distress.     Breath sounds: Normal breath sounds.  Abdominal:     General: Bowel sounds are normal. There is no distension.     Palpations: Abdomen is soft.     Tenderness: There is no abdominal tenderness.  Musculoskeletal:        General: No swelling or deformity.  Skin:    General: Skin is warm and dry.  Neurological:     General: No focal deficit present.     Comments: Remains confused.  Largely will say yes no to questions.  But did answer some questions appropriately.  Alert and oriented to self.    Labs on Admission: I have personally reviewed following labs and imaging studies  CBC: Recent Labs  Lab 08/16/20 1945 08/16/20 2002  WBC 21.0*  --   NEUTROABS 20.2*  --   HGB 11.3* 10.2*  HCT 35.8* 30.0*  MCV 86.1  --   PLT 183  --     Basic Metabolic Panel: Recent Labs  Lab 08/16/20 1945 08/16/20 2002  NA 138 140  K 3.9 3.7  CL 102  --   CO2 22  --   GLUCOSE 179*  --   BUN 50*  --   CREATININE 3.14*  --   CALCIUM 8.7*  --     GFR: Estimated Creatinine  Clearance: 13.6 mL/min (A) (by C-G formula based on SCr of 3.14 mg/dL (H)).  Liver Function Tests: Recent Labs  Lab 08/16/20 1945  AST 44*  ALT 22  ALKPHOS 87  BILITOT 1.4*  PROT 7.1  ALBUMIN 2.3*    Urine analysis:    Component  Value Date/Time   COLORURINE YELLOW 08/16/2020 1945   APPEARANCEUR TURBID (A) 08/16/2020 1945   LABSPEC 1.008 08/16/2020 1945   PHURINE 5.0 08/16/2020 1945   GLUCOSEU NEGATIVE 08/16/2020 1945   HGBUR MODERATE (A) 08/16/2020 1945   BILIRUBINUR NEGATIVE 08/16/2020 1945   BILIRUBINUR negative 10/01/2017 0901   BILIRUBINUR neg 04/02/2014 [place] 08/16/2020 1945   PROTEINUR 100 (A) 08/16/2020 1945   UROBILINOGEN 0.2 10/01/2017 0901   UROBILINOGEN 0.2 02/12/2015 2205   NITRITE NEGATIVE 08/16/2020 1945   LEUKOCYTESUR MODERATE (A) 08/16/2020 1945    Radiological Exams on Admission: CT Head Wo Contrast  Result Date: 08/16/2020 CLINICAL DATA:  Altered mental status. EXAM: CT HEAD WITHOUT CONTRAST TECHNIQUE: Contiguous axial images were obtained from the base of the skull through the vertex without intravenous contrast. COMPARISON:  July 05, 2020 FINDINGS: Brain: There is mild cerebral atrophy with widening of the extra-axial spaces and ventricular dilatation. There are areas of decreased attenuation within the white matter tracts of the supratentorial brain, consistent with microvascular disease changes. Vascular: No hyperdense vessel or unexpected calcification. Skull: Normal. Negative for fracture or focal lesion. Sinuses/Orbits: No acute finding. Other: None. IMPRESSION: 1. Generalized cerebral atrophy. 2. No acute intracranial abnormality. Electronically Signed   By: Virgina Norfolk M.D.   On: 08/16/2020 21:04   DG Chest Port 1 View  Result Date: 08/16/2020 CLINICAL DATA:  Sepsis EXAM: PORTABLE CHEST 1 VIEW COMPARISON:  None. FINDINGS: The heart size and mediastinal contours are within normal limits. Aortic knob calcifications are  seen. Both lungs are clear. The visualized skeletal structures are unremarkable. IMPRESSION: No active disease. Electronically Signed   By: Prudencio Pair M.D.   On: 08/16/2020 19:45    EKG: Unable to review EKG due to technical difficulties with EMR.  Assessment/Plan Principal Problem:   Sepsis due to urinary tract infection (HCC) Active Problems:   Uncontrolled hypertension   Controlled diabetes mellitus without long-term current use of insulin (HCC)   Type 2 diabetes mellitus without complication, without long-term current use of insulin (HCC)   AKI (acute kidney injury) ([place])   Severe sepsis (HCC)  Severe sepsis due to urinary tract infection > Patient meets criteria for severe sepsis with tachycardia, tachypnea, fever to 103, WBC 21.  Meets severe sepsis due to acute renal failure. > Urinary source is most likely etiology given frank pus and positive UA.  Obtaining urine cultures and blood cultures > Foley was reportedly exchanged in the ED > Lactic acid trend 3.1-2.9 - Continue broad-spectrum antibiotics for now pending cultures - Follow-up urine and blood cultures - Follow fever curve and white count. -Continue to trend lactic acid  Acute renal failure  > Creatinine of 3.14 from a baseline of about 1.5; in the setting of sepsis > Received 2 L of IV fluids in ED will continue maintenance fluids - Maintenance fluids - Avoid nephrotoxic agents - AM BMP  BPH status post chronic Foley secondary to hydronephrosis - Foley in place - Continue tamsulosin and finasteride  Hypertension - Hold home amlodipine and hydralazine in the setting of sepsis  Diabetes - Hold home oral medications - SSI  Hx of PE on Eliquis  - Continue Eliquis  DVT prophylaxis: Eliquis  Code Status:   Partial, intubation okay, no ACLS  Family Communication:  Updated son at bedside   Disposition Plan:   Patient is from:  Home  Anticipated DC to:  Home  Anticipated DC date:  Pending clinical  course  Anticipated  DC barriers: None  Consults called:  None Admission status:  Inpatient, telemetry   Severity of Illness: The appropriate patient status for this patient is INPATIENT. Inpatient status is judged to be reasonable and necessary in order to provide the required intensity of service to ensure the patient's safety. The patient's presenting symptoms, physical exam findings, and initial radiographic and laboratory data in the context of their chronic comorbidities is felt to place them at high risk for further clinical deterioration. Furthermore, it is not anticipated that the patient will be medically stable for discharge from the hospital within 2 midnights of admission. The following factors support the patient status of inpatient.   " The patient's presenting symptoms include lethargy, altered mental status. " The worrisome physical exam findings include initial tachypnea, tachycardia, altered mental status, fever. " The initial radiographic and laboratory data are worrisome because of elevated white count to 21, creatinine 3.1.. " The chronic co-morbidities include diabetes, BPH, history of PE.   * I certify that at the point of admission it is my clinical judgment that the patient will require inpatient hospital care spanning beyond 2 midnights from the point of admission due to high intensity of service, high risk for further deterioration and high frequency of surveillance required.Marcelyn Bruins MD [facility]  How to contact the [facility] Attending or Consulting provider [place] or covering provider during after hours [place], for this patient?   1. Check the care team in [facility] and look for a) attending/consulting [facility] provider listed and b) the [facility] team listed 2. Log into [email or web address] and use [place]'s universal password to access. If you do not have the password, please contact the hospital operator. 3. Locate the [facility] provider you are looking for under [facility] and page to a number that you can be directly reached. 4. If you still have difficulty reaching the provider, please page the [facility] (Director on Call) for the Hospitalists listed on amion for assistance.  08/17/2020, 12:21 AM

## 2020-08-16 NOTE — ED Notes (Signed)
This RN attempted x2 to get blood cultures-- unsuccessful. Antibiotics were initiated not to delay care. Phlebotomy to assist in blood culture collection.

## 2020-08-16 NOTE — ED Notes (Signed)
CRITICAL VALUE ALERT  Critical Value:  Lactic 3.1  Date & Time Notied:  08/16/20 2038  Provider Notified: Dr. Lurena Nida

## 2020-08-16 NOTE — ED Notes (Signed)
Replaced pt's indwelling urinary catheter. Upon removal of old catheter, pt starting to urinate-- noted foul smell. Replaced foley per hospital policy. Pt initally had blood tinged urine, but quickly changed to cloudy.

## 2020-08-17 ENCOUNTER — Encounter (HOSPITAL_COMMUNITY): Payer: Self-pay | Admitting: Internal Medicine

## 2020-08-17 DIAGNOSIS — A419 Sepsis, unspecified organism: Secondary | ICD-10-CM | POA: Diagnosis not present

## 2020-08-17 DIAGNOSIS — N390 Urinary tract infection, site not specified: Secondary | ICD-10-CM | POA: Diagnosis not present

## 2020-08-17 DIAGNOSIS — R6520 Severe sepsis without septic shock: Secondary | ICD-10-CM | POA: Insufficient documentation

## 2020-08-17 LAB — CBC
HCT: 30.6 % — ABNORMAL LOW (ref 39.0–52.0)
Hemoglobin: 9.9 g/dL — ABNORMAL LOW (ref 13.0–17.0)
MCH: 27.2 pg (ref 26.0–34.0)
MCHC: 32.4 g/dL (ref 30.0–36.0)
MCV: 84.1 fL (ref 80.0–100.0)
Platelets: 142 10*3/uL — ABNORMAL LOW (ref 150–400)
RBC: 3.64 MIL/uL — ABNORMAL LOW (ref 4.22–5.81)
RDW: 15 % (ref 11.5–15.5)
WBC: 26.6 10*3/uL — ABNORMAL HIGH (ref 4.0–10.5)
nRBC: 0 % (ref 0.0–0.2)

## 2020-08-17 LAB — COMPREHENSIVE METABOLIC PANEL
ALT: 22 U/L (ref 0–44)
AST: 39 U/L (ref 15–41)
Albumin: 2 g/dL — ABNORMAL LOW (ref 3.5–5.0)
Alkaline Phosphatase: 88 U/L (ref 38–126)
Anion gap: 11 (ref 5–15)
BUN: 42 mg/dL — ABNORMAL HIGH (ref 8–23)
CO2: 24 mmol/L (ref 22–32)
Calcium: 8.5 mg/dL — ABNORMAL LOW (ref 8.9–10.3)
Chloride: 106 mmol/L (ref 98–111)
Creatinine, Ser: 2.49 mg/dL — ABNORMAL HIGH (ref 0.61–1.24)
GFR, Estimated: 25 mL/min — ABNORMAL LOW (ref >60)
Glucose, Bld: 129 mg/dL — ABNORMAL HIGH (ref 70–99)
Potassium: 3.7 mmol/L (ref 3.5–5.1)
Sodium: 141 mmol/L (ref 135–145)
Total Bilirubin: 1.1 mg/dL (ref 0.3–1.2)
Total Protein: 6.5 g/dL (ref 6.5–8.1)

## 2020-08-17 LAB — GLUCOSE, CAPILLARY
Glucose-Capillary: 105 mg/dL — ABNORMAL HIGH (ref 70–99)
Glucose-Capillary: 121 mg/dL — ABNORMAL HIGH (ref 70–99)
Glucose-Capillary: 104 mg/dL — ABNORMAL HIGH (ref 70–99)
Glucose-Capillary: 163 mg/dL — ABNORMAL HIGH (ref 70–99)

## 2020-08-17 LAB — LACTIC ACID, PLASMA
Lactic Acid, Venous: 2.2 mmol/L (ref 0.5–1.9)
Lactic Acid, Venous: 1.5 mmol/L (ref 0.5–1.9)

## 2020-08-17 LAB — PROTIME-INR
INR: 2.3 — ABNORMAL HIGH (ref 0.8–1.2)
Prothrombin Time: 24.6 seconds — ABNORMAL HIGH (ref 11.4–15.2)

## 2020-08-17 LAB — VANCOMYCIN, TROUGH: Vancomycin Tr: 15 ug/mL (ref 15–20)

## 2020-08-17 MED ORDER — PNEUMOCOCCAL VAC POLYVALENT 25 MCG/0.5ML IJ INJ
0.5000 mL | INJECTION | INTRAMUSCULAR | Status: AC
  Administered 2020-08-18: 0.5 mL via INTRAMUSCULAR
  Filled 2020-08-17: qty 0.5

## 2020-08-17 MED ORDER — CHLORHEXIDINE GLUCONATE CLOTH 2 % EX PADS
6.0000 | MEDICATED_PAD | Freq: Every day | CUTANEOUS | Status: DC
  Administered 2020-08-17 – 2020-08-19 (×3): 6 via TOPICAL

## 2020-08-17 MED ORDER — LABETALOL HCL 5 MG/ML IV SOLN: 10.0000 mg | INTRAVENOUS | Status: DC | PRN

## 2020-08-17 MED ORDER — VANCOMYCIN HCL 1250 MG/250ML IV SOLN
1250.0000 mg | Freq: Once | INTRAVENOUS | Status: AC
  Administered 2020-08-17: 1250 mg via INTRAVENOUS
  Filled 2020-08-17: qty 250

## 2020-08-17 MED ORDER — ACETAMINOPHEN 325 MG PO TABS
650.0000 mg | ORAL_TABLET | Freq: Four times a day (QID) | ORAL | Status: DC | PRN
  Administered 2020-08-17 (×2): 650 mg via ORAL
  Filled 2020-08-17 (×2): qty 2

## 2020-08-17 MED ORDER — INSULIN ASPART 100 UNIT/ML ~~LOC~~ SOLN
0.0000 [IU] | Freq: Three times a day (TID) | SUBCUTANEOUS | Status: DC
  Administered 2020-08-17: 2 [IU] via SUBCUTANEOUS
  Administered 2020-08-18: 8 [IU] via SUBCUTANEOUS
  Administered 2020-08-19: 3 [IU] via SUBCUTANEOUS

## 2020-08-17 NOTE — Progress Notes (Signed)
   08/17/20 1951  Assess: MEWS Score  Temp (!) 100.9 F (38.3 C)  BP (!) 154/74  Pulse Rate (!) 115  Resp 18  Level of Consciousness Alert  SpO2 100 %  O2 Device Room Air  Assess: MEWS Score  MEWS Temp 1  MEWS Systolic 0  MEWS Pulse 2  MEWS RR 0  MEWS LOC 0  MEWS Score 3  MEWS Score Color Yellow  Assess: if the MEWS score is Yellow or Red  Were vital signs taken at a resting state? Yes  Focused Assessment No change from prior assessment  Early Detection of Sepsis Score *See Row Information* High  MEWS guidelines implemented *See Row Information* Yes  Treat  MEWS Interventions Administered prn meds/treatments (will give tylenol)  Pain Scale 0-10  Pain Score 0  Take Vital Signs  Increase Vital Sign Frequency  Yellow: Q 2hr X 2 then Q 4hr X 2, if remains yellow, continue Q 4hrs  Escalate  MEWS: Escalate Yellow: discuss with charge nurse/RN and consider discussing with provider and RRT  Notify: Charge Nurse/RN  Name of Charge Nurse/RN Notified Fred RN  Date Charge Nurse/RN Notified 08/17/20  Time Charge Nurse/RN Notified 1954  Notify: Provider  Provider Name/Title Opyd,MD  Date Provider Notified 08/17/20  Time Provider Notified 1956  Notification Type Page

## 2020-08-17 NOTE — Progress Notes (Signed)
PROGRESS NOTE    Sergio [name]  HUD:[id number] DOB: 07-19-1933 DOA: 08/16/2020 PCP: Forrest Moron, MD   Brief Narrative:  Sergio [name] is a 86 y.o. male with medical history significant of diabetes, BPH status post chronic Foley secondary to hydronephrosis, hyperlipidemia, PE on Eliquis who presents after 2 days of lethargy and new altered mental status.  Patient continues to have some altered mental status and is unable to fully participate in HPI history is obtained with the assistance of the patient's son and chart.  He has been somewhat altered and confused the last few days and has been more lethargic.  His son found him on the ground this morning as if he had been sleeping on the ground.  He had not complained of anything in the last several days.  When I saw patient he would say yes and no to some questions and was alert to self and would say some complete sentences. Patient initially tachycardic to 122, tachypneic to 24, febrile to 103; vitals have improved with fever control and IV fluids.  Lab work showed creatinine of 3.1 from a baseline of 1.5, glucose 179, calcium 8.7 which corrects considering albumin of 2.3, T bili 1.4.  CBC with leukocytosis to 21 and Hgb stable at 11.  PT PTT T and INR all elevated in the setting of Eliquis use.  TSH and ammonia within normal limits.  ABG showed normal pH.  Urinalysis was consistent with UTI and frank pus was noted in catheter bag.  Urine culture and blood cultures were obtained respiratory panel negative for flu and Covid.  Patient received 2 L IV fluids, vancomycin and cefepime in the ED.  Confusion appear to be starting to clear at the time I examined the patient.    Assessment & Plan:   Principal Problem:   Sepsis due to urinary tract infection ([place]) Active Problems:   Uncontrolled hypertension   Controlled diabetes mellitus without long-term current use of insulin (HCC)   Type 2 diabetes mellitus without complication, without  long-term current use of insulin (HCC)   AKI (acute kidney injury) ([place])   Severe sepsis (HCC)   Severe sepsis due to urinary tract infection - Patient meets criteria for severe sepsis with tachycardia, tachypnea, fever to 103, WBC 21.  Meets severe sepsis due to acute renal failure and lactic acidosis. - Urinary source is most likely etiology given frank pus and abnormal UA - Foley was exchanged in the ED per sign out -Lactic acid WNL this am on recheck after IVF    Component Value Date/Time   LATICACIDVEN 1.5 08/17/2020 0508  - Continue cefepime - DC vancomycin in the setting of AKI - Follow cultures - de-escalate as possible  Acute renal failure  - Creatinine of 3.14 from a baseline of about 1.5; in the setting of sepsis - STOP vancomycin as above - Continue IVF - encouraged increased PO intake  BPH status post chronic Foley secondary to hydronephrosis - Foley in place - Continue tamsulosin and finasteride - home medications  Hypertension, essential - Hold home amlodipine and hydralazine in the setting of sepsis and borderline hypotension  Diabetes, non insulin dependent - Hold home oral medications - SSI  Hx of PE on Eliquis  - Continue Eliquis  DVT prophylaxis:      Eliquis  Code Status:  Partial, intubation okay, no ACLS  Family Communication:  Updated son at admission  Status is: Inpatient  Dispo: The patient is from: Home  Anticipated d/c is to: Same              Anticipated d/c date is: 20 to 72 hours              Patient currently is not medically stable for discharge due to ongoing need for IV antibiotics in the setting of severe sepsis lactic acidosis  Consultants:   None  Procedures:   None planned  Antimicrobials:  Cefepime -08/16/2020 to present Vancomycin -08/16/2020 -discontinued 08/17/2020  Subjective: No acute issues or events overnight, patient feels markedly improved since admission with IV fluids now tolerating p.o.  quite well.  Otherwise denies chest pain, shortness of breath, nausea, vomiting, headache, fevers, chills.  Objective: Vitals:   08/16/20 2349 08/17/20 0011 08/17/20 0119 08/17/20 0351  BP: 102/72 110/64 (!) 158/76 (!) 170/83  Pulse: (!) 107 (!) 102 (!) 101 95  Resp: 18 16 20 19   Temp: 99.3 F (37.4 C)  (!) 97.5 F (36.4 C) 99.2 F (37.3 C)  TempSrc: Oral  Oral Oral  SpO2: 100% 99% 91% 100%  Weight:   62.8 kg   Height:   5' (1.524 m)     Intake/Output Summary (Last 24 hours) at 08/17/2020 0732 Last data filed at 08/17/2020 0356 Gross per 24 hour  Intake 1345.32 ml  Output 1550 ml  Net -204.68 ml   Filed Weights   08/16/20 1853 08/17/20 0119  Weight: 64.7 kg 62.8 kg    Examination: General:  Pleasantly resting in bed, No acute distress. HEENT:  Normocephalic atraumatic.  Sclerae nonicteric, noninjected.  Extraocular movements intact bilaterally. Neck:  Without mass or deformity.  Trachea is midline. Lungs:  Clear to auscultate bilaterally without rhonchi, wheeze, or rales. Heart:  Regular rate and rhythm.  Without murmurs, rubs, or gallops. Abdomen:  Soft, nontender, nondistended.  Without guarding or rebound.  Foley intact. Extremities: Without cyanosis, clubbing, edema, or obvious deformity. Vascular:  Dorsalis pedis and posterior tibial pulses palpable bilaterally. Skin:  Warm and dry, no erythema, no ulcerations.   Data Reviewed: I have personally reviewed following labs and imaging studies  CBC: Recent Labs  Lab 08/16/20 1945 08/16/20 2002 08/17/20 0251  WBC 21.0*  --  26.6*  NEUTROABS 20.2*  --   --   HGB 11.3* 10.2* 9.9*  HCT 35.8* 30.0* 30.6*  MCV 86.1  --  84.1  PLT 183  --  379*   Basic Metabolic Panel: Recent Labs  Lab 08/16/20 1945 08/16/20 2002 08/17/20 0251  NA 138 140 141  K 3.9 3.7 3.7  CL 102  --  106  CO2 22  --  24  GLUCOSE 179*  --  129*  BUN 50*  --  42*  CREATININE 3.14*  --  2.49*  CALCIUM 8.7*  --  8.5*   GFR: Estimated  Creatinine Clearance: 16.9 mL/min (A) (by C-G formula based on SCr of 2.49 mg/dL (H)). Liver Function Tests: Recent Labs  Lab 08/16/20 1945 08/17/20 0251  AST 44* 39  ALT 22 22  ALKPHOS 87 88  BILITOT 1.4* 1.1  PROT 7.1 6.5  ALBUMIN 2.3* 2.0*   No results for input(s): LIPASE, AMYLASE in the last 168 hours. Recent Labs  Lab 08/16/20 1945  AMMONIA 18   Coagulation Profile: Recent Labs  Lab 08/16/20 1945 08/17/20 0251  INR 2.3* 2.3*   Cardiac Enzymes: No results for input(s): CKTOTAL, CKMB, CKMBINDEX, TROPONINI in the last 168 hours. BNP (last 3 results) No results for input(s): PROBNP in the  last 8760 hours. HbA1C: No results for input(s): HGBA1C in the last 72 hours. CBG: Recent Labs  Lab 08/17/20 0613  GLUCAP 105*   Lipid Profile: No results for input(s): CHOL, HDL, LDLCALC, TRIG, CHOLHDL, LDLDIRECT in the last 72 hours. Thyroid Function Tests: Recent Labs    08/16/20 1945  TSH 3.185   Anemia Panel: No results for input(s): VITAMINB12, FOLATE, FERRITIN, TIBC, IRON, RETICCTPCT in the last 72 hours. Sepsis Labs: Recent Labs  Lab 08/16/20 1945 08/16/20 2300 08/17/20 0251 08/17/20 0508  LATICACIDVEN 3.1* 2.9* 2.2* 1.5    Recent Results (from the past 240 hour(s))  Respiratory Panel by RT PCR (Flu A&B, Covid) - Nasopharyngeal Swab     Status: None   Collection Time: 08/16/20  7:46 PM   Specimen: Nasopharyngeal Swab; Nasopharyngeal(NP) swabs in vial transport medium  Result Value Ref Range Status   SARS Coronavirus 2 by RT PCR NEGATIVE NEGATIVE Final    Comment: (NOTE) SARS-CoV-2 target nucleic acids are NOT DETECTED.  The SARS-CoV-2 RNA is generally detectable in upper respiratoy specimens during the acute phase of infection. The lowest concentration of SARS-CoV-2 viral copies this assay can detect is 131 copies/mL. A negative result does not preclude SARS-Cov-2 infection and should not be used as the sole basis for treatment or other patient  management decisions. A negative result may occur with  improper specimen collection/handling, submission of specimen other than nasopharyngeal swab, presence of viral mutation(s) within the areas targeted by this assay, and inadequate number of viral copies (<131 copies/mL). A negative result must be combined with clinical observations, patient history, and epidemiological information. The expected result is Negative.  Fact Sheet for Patients:  PinkCheek.be  Fact Sheet for Healthcare Providers:  GravelBags.it  This test is no t yet approved or cleared by the [place] FDA and  has been authorized for detection and/or diagnosis of SARS-CoV-2 by FDA under an Emergency Use Authorization (EUA). This EUA will remain  in effect (meaning this test can be used) for the duration of the COVID-19 declaration under Section 564(b)(1) of the Act, 21 U.S.C. section 360bbb-3(b)(1), unless the authorization is terminated or revoked sooner.     Influenza A by PCR NEGATIVE NEGATIVE Final   Influenza B by PCR NEGATIVE NEGATIVE Final    Comment: (NOTE) The Xpert Xpress SARS-CoV-2/FLU/RSV assay is intended as an aid in  the diagnosis of influenza from Nasopharyngeal swab specimens and  should not be used as a sole basis for treatment. Nasal washings and  aspirates are unacceptable for Xpert Xpress SARS-CoV-2/FLU/RSV  testing.  Fact Sheet for Patients: PinkCheek.be  Fact Sheet for Healthcare Providers: GravelBags.it  This test is not yet approved or cleared by the [place] FDA and  has been authorized for detection and/or diagnosis of SARS-CoV-2 by  FDA under an Emergency Use Authorization (EUA). This EUA will remain  in effect (meaning this test can be used) for the duration of the  Covid-19 declaration under Section 564(b)(1) of the Act, 21  U.S.C. section 360bbb-3(b)(1),  unless the authorization is  terminated or revoked. Performed at [facility], [place] [address], [place], [place] [address]          Radiology Studies: CT Head Wo Contrast  Result Date: 08/16/2020 CLINICAL DATA:  Altered mental status. EXAM: CT HEAD WITHOUT CONTRAST TECHNIQUE: Contiguous axial images were obtained from the base of the skull through the vertex without intravenous contrast. COMPARISON:  July 05, 2020 FINDINGS: Brain: There is mild cerebral atrophy  with widening of the extra-axial spaces and ventricular dilatation. There are areas of decreased attenuation within the white matter tracts of the supratentorial brain, consistent with microvascular disease changes. Vascular: No hyperdense vessel or unexpected calcification. Skull: Normal. Negative for fracture or focal lesion. Sinuses/Orbits: No acute finding. Other: None. IMPRESSION: 1. Generalized cerebral atrophy. 2. No acute intracranial abnormality. Electronically Signed   By: Virgina Norfolk M.D.   On: 08/16/2020 21:04   DG Chest Port 1 View  Result Date: 08/16/2020 CLINICAL DATA:  Sepsis EXAM: PORTABLE CHEST 1 VIEW COMPARISON:  None. FINDINGS: The heart size and mediastinal contours are within normal limits. Aortic knob calcifications are seen. Both lungs are clear. The visualized skeletal structures are unremarkable. IMPRESSION: No active disease. Electronically Signed   By: Prudencio Pair M.D.   On: 08/16/2020 19:45        Scheduled Meds: . apixaban  5 mg Oral BID  . finasteride  5 mg Oral Daily  . insulin aspart  0-15 Units Subcutaneous TID WC  . sodium chloride flush  3 mL Intravenous Q12H  . tamsulosin  0.4 mg Oral Daily  . vancomycin variable dose per unstable renal function (pharmacist dosing)   Does not apply See admin instructions   Continuous Infusions: . ceFEPime (MAXIPIME) IV    . lactated ringers 150 mL/hr at 08/16/20 2352     LOS: 1 day   Time spent: 49min   Gal Smolinski C Destenie Ingber,  DO [facility]  If 7PM-7AM, please contact night-coverage [email or web address]  08/17/2020, 7:32 AM

## 2020-08-17 NOTE — Progress Notes (Signed)
Pharmacy Antibiotic Note  Sergio [name] is a 86 y.o. male admitted on 08/16/2020 with sepsis.  Pharmacy has been consulted for Cefepime and Vancomycin dosing.  SBP stable, HR elevated 110s. Tmax 103F. WBC 26.6. SCr improving, down from 3.14 to 2.49. Vancomycin random = 15.    Height: 5' (152.4 cm) Weight: 62.8 kg (138 lb 7.2 oz) IBW/kg (Calculated) : 50  Temp (24hrs), Avg:99.9 F (37.7 C), Min:97.5 F (36.4 C), Max:103 F (39.4 C)  Recent Labs  Lab 08/16/20 1945 08/16/20 2300 08/17/20 0251 08/17/20 0508  WBC 21.0*  --  26.6*  --   CREATININE 3.14*  --  2.49*  --   LATICACIDVEN 3.1* 2.9* 2.2* 1.5    Estimated Creatinine Clearance: 16.9 mL/min (A) (by C-G formula based on SCr of 2.49 mg/dL (H)).    No Known Allergies  Antimicrobials this admission: 11/19 Cefepime >>  11/19 Vancomycin >>   Dose adjustments this admission: N/a  Microbiology results: Pending   Plan:  - Continue cefepime 2g IV q24h - Vancomycin 1250mg  IV x 1 dose  - Will dose vancomycin by random levels due to AKI (baseline ~1.4) - Monitor patients renal function and urine output  - De-escalate ABX when appropriate   Thank you for allowing pharmacy to be a part of this patient's care.   Mercy Riding, PharmD PGY1 Acute Care Pharmacy Resident Please refer to [facility] for unit-specific pharmacist

## 2020-08-18 ENCOUNTER — Other Ambulatory Visit: Payer: Self-pay

## 2020-08-18 ENCOUNTER — Encounter (HOSPITAL_COMMUNITY): Payer: Self-pay | Admitting: Internal Medicine

## 2020-08-18 DIAGNOSIS — N390 Urinary tract infection, site not specified: Secondary | ICD-10-CM | POA: Diagnosis not present

## 2020-08-18 DIAGNOSIS — A419 Sepsis, unspecified organism: Secondary | ICD-10-CM | POA: Diagnosis not present

## 2020-08-18 LAB — BASIC METABOLIC PANEL
Anion gap: 9 (ref 5–15)
Anion gap: 8 (ref 5–15)
BUN: 28 mg/dL — ABNORMAL HIGH (ref 8–23)
BUN: 25 mg/dL — ABNORMAL HIGH (ref 8–23)
CO2: 26 mmol/L (ref 22–32)
CO2: 26 mmol/L (ref 22–32)
Calcium: 8.7 mg/dL — ABNORMAL LOW (ref 8.9–10.3)
Calcium: 8.7 mg/dL — ABNORMAL LOW (ref 8.9–10.3)
Chloride: 111 mmol/L (ref 98–111)
Chloride: 109 mmol/L (ref 98–111)
Creatinine, Ser: 1.88 mg/dL — ABNORMAL HIGH (ref 0.61–1.24)
Creatinine, Ser: 1.74 mg/dL — ABNORMAL HIGH (ref 0.61–1.24)
GFR, Estimated: 35 mL/min — ABNORMAL LOW (ref >60)
GFR, Estimated: 38 mL/min — ABNORMAL LOW (ref >60)
Glucose, Bld: 118 mg/dL — ABNORMAL HIGH (ref 70–99)
Glucose, Bld: 247 mg/dL — ABNORMAL HIGH (ref 70–99)
Potassium: 2.7 mmol/L — CL (ref 3.5–5.1)
Potassium: 2.8 mmol/L — ABNORMAL LOW (ref 3.5–5.1)
Sodium: 146 mmol/L — ABNORMAL HIGH (ref 135–145)
Sodium: 143 mmol/L (ref 135–145)

## 2020-08-18 LAB — GLUCOSE, CAPILLARY
Glucose-Capillary: 98 mg/dL (ref 70–99)
Glucose-Capillary: 252 mg/dL — ABNORMAL HIGH (ref 70–99)
Glucose-Capillary: 83 mg/dL (ref 70–99)
Glucose-Capillary: 166 mg/dL — ABNORMAL HIGH (ref 70–99)

## 2020-08-18 LAB — CBC
HCT: 29.7 % — ABNORMAL LOW (ref 39.0–52.0)
Hemoglobin: 9.8 g/dL — ABNORMAL LOW (ref 13.0–17.0)
MCH: 27.4 pg (ref 26.0–34.0)
MCHC: 33 g/dL (ref 30.0–36.0)
MCV: 83 fL (ref 80.0–100.0)
Platelets: 126 10*3/uL — ABNORMAL LOW (ref 150–400)
RBC: 3.58 MIL/uL — ABNORMAL LOW (ref 4.22–5.81)
RDW: 15 % (ref 11.5–15.5)
WBC: 18.6 10*3/uL — ABNORMAL HIGH (ref 4.0–10.5)
nRBC: 0 % (ref 0.0–0.2)

## 2020-08-18 MED ORDER — MAGNESIUM SULFATE IN D5W 1-5 GM/100ML-% IV SOLN
1.0000 g | Freq: Once | INTRAVENOUS | Status: AC
  Administered 2020-08-18: 1 g via INTRAVENOUS
  Filled 2020-08-18: qty 100

## 2020-08-18 MED ORDER — POTASSIUM CHLORIDE 10 MEQ/100ML IV SOLN
10.0000 meq | INTRAVENOUS | Status: AC
  Administered 2020-08-18 (×2): 10 meq via INTRAVENOUS
  Filled 2020-08-18 (×2): qty 100

## 2020-08-18 MED ORDER — POTASSIUM CHLORIDE CRYS ER 20 MEQ PO TBCR
40.0000 meq | EXTENDED_RELEASE_TABLET | Freq: Once | ORAL | Status: AC
  Administered 2020-08-18: 40 meq via ORAL
  Filled 2020-08-18: qty 2

## 2020-08-18 NOTE — Progress Notes (Signed)
Lab notified RN about critical K+ of 2.7. MD notified. See new orders

## 2020-08-18 NOTE — Progress Notes (Signed)
PROGRESS NOTE    Sergio [name]  RXV:[id number] DOB: 03-07-1933 DOA: 08/16/2020 PCP: Forrest Moron, MD   Brief Narrative:  Sergio [name] is a 86 y.o. male with medical history significant of diabetes, BPH status post chronic Foley secondary to hydronephrosis, hyperlipidemia, PE on Eliquis who presents after 2 days of lethargy and new altered mental status.  Patient continues to have some altered mental status and is unable to fully participate in HPI history is obtained with the assistance of the patient's son and chart.  He has been somewhat altered and confused the last few days and has been more lethargic.  His son found him on the ground this morning as if he had been sleeping on the ground.  He had not complained of anything in the last several days.  When I saw patient he would say yes and no to some questions and was alert to self and would say some complete sentences. Patient initially tachycardic to 122, tachypneic to 24, febrile to 103; vitals have improved with fever control and IV fluids.  Lab work showed creatinine of 3.1 from a baseline of 1.5, glucose 179, calcium 8.7 which corrects considering albumin of 2.3, T bili 1.4.  CBC with leukocytosis to 21 and Hgb stable at 11.  PT PTT T and INR all elevated in the setting of Eliquis use.  TSH and ammonia within normal limits.  ABG showed normal pH.  Urinalysis was consistent with UTI and frank pus was noted in catheter bag.  Urine culture and blood cultures were obtained respiratory panel negative for flu and Covid.  Patient received 2 L IV fluids, vancomycin and cefepime in the ED.   Patient admitted as above with transient episode of confusion in the setting of sepsis and UTI with notable AKI on CKD 2.  Patient rapidly improving, mental status back to baseline, currently continues on IV antibiotics and IV fluids in the setting of AKI and UTI.  Hopeful disposition in the next 24 to 48 hours   Assessment & Plan:   Principal Problem:    Sepsis due to urinary tract infection ([place]) Active Problems:   Uncontrolled hypertension   Controlled diabetes mellitus without long-term current use of insulin (HCC)   Type 2 diabetes mellitus without complication, without long-term current use of insulin (HCC)   AKI (acute kidney injury) ([place])   Severe sepsis (HCC)   Severe sepsis due to urinary tract infection - Patient meets criteria for severe sepsis with tachycardia, tachypnea, fever to 103, WBC 21.  Meets severe sepsis due to acute renal failure and lactic acidosis. - Urinary source is most likely etiology given frank pus and abnormal UA - Foley was exchanged in the ED per sign out - Afebrile - leukocytosis imroving - Continue cefepime - DC vancomycin in the setting of AKI - Follow cultures - de-escalate antibiotics as possible  AKI with questionable baseline CKD2 (undiagnosed), POA - Creatinine of 3.14 from a baseline of about 1.5; in the setting of sepsis - STOP vancomycin as above - Continue IVF - encouraged increased PO intake  Hypokalemia - Repleted 100MEQ given today  BPH status post chronic Foley secondary to hydronephrosis - Foley in place - Continue tamsulosin and finasteride - home medications  Hypertension, essential - Hold home amlodipine and hydralazine in the setting of sepsis and borderline hypotension  Diabetes, non insulin dependent - Hold home oral medications - SSI  Hx of PE on Eliquis  - Continue Eliquis  DVT prophylaxis:  Eliquis  Code Status:  Partial, intubation okay, no ACLS Family Communication:  Updated son at admission  Status is: Inpatient  Dispo: The patient is from: Home             Anticipated d/c is to: Same             Anticipated d/c date is: 24-48 hours             Patient currently is not medically stable for discharge due to ongoing need for IV antibiotics in the setting of severe sepsis lactic acidosis  Consultants:   None  Procedures:   None  planned  Antimicrobials:  Cefepime -08/16/2020 to present Vancomycin -08/16/2020 -discontinued 08/17/2020  Subjective: No acute issues or events overnight, patient feels markedly improved since admission with IV fluids now tolerating p.o. quite well.  Otherwise denies chest pain, shortness of breath, nausea, vomiting, headache, fevers, chills.  Objective: Vitals:   08/18/20 0014 08/18/20 0300 08/18/20 0400 08/18/20 0727  BP: (!) 141/98  (!) 142/81 (!) 186/99  Pulse: 98  96 (!) 101  Resp: 18  18 20   Temp: 98.7 F (37.1 C)  98.3 F (36.8 C) 98.1 F (36.7 C)  TempSrc: Oral  Oral Oral  SpO2: 99%  98% 97%  Weight:  62 kg    Height:        Intake/Output Summary (Last 24 hours) at 08/18/2020 0732 Last data filed at 08/18/2020 0500 Gross per 24 hour  Intake 1867.52 ml  Output 2400 ml  Net -532.48 ml   Filed Weights   08/16/20 1853 08/17/20 0119 08/18/20 0300  Weight: 64.7 kg 62.8 kg 62 kg    Examination: General:  Pleasantly resting in bed, No acute distress. HEENT:  Normocephalic atraumatic.  Sclerae nonicteric, noninjected.  Extraocular movements intact bilaterally. Neck:  Without mass or deformity.  Trachea is midline. Lungs:  Clear to auscultate bilaterally without rhonchi, wheeze, or rales. Heart:  Regular rate and rhythm.  Without murmurs, rubs, or gallops. Abdomen:  Soft, nontender, nondistended.  Without guarding or rebound.  Foley intact. Extremities: Without cyanosis, clubbing, edema, or obvious deformity. Vascular:  Dorsalis pedis and posterior tibial pulses palpable bilaterally. Skin:  Warm and dry, no erythema, no ulcerations.   Data Reviewed: I have personally reviewed following labs and imaging studies  CBC: Recent Labs  Lab 08/16/20 1945 08/16/20 2002 08/17/20 0251 08/18/20 0451  WBC 21.0*  --  26.6* 18.6*  NEUTROABS 20.2*  --   --   --   HGB 11.3* 10.2* 9.9* 9.8*  HCT 35.8* 30.0* 30.6* 29.7*  MCV 86.1  --  84.1 83.0  PLT 183  --  142* 126*    Basic Metabolic Panel: Recent Labs  Lab 08/16/20 1945 08/16/20 2002 08/17/20 0251 08/18/20 0451  NA 138 140 141 146*  K 3.9 3.7 3.7 2.7*  CL 102  --  106 111  CO2 22  --  24 26  GLUCOSE 179*  --  129* 118*  BUN 50*  --  42* 28*  CREATININE 3.14*  --  2.49* 1.88*  CALCIUM 8.7*  --  8.5* 8.7*   GFR: Estimated Creatinine Clearance: 22.3 mL/min (A) (by C-G formula based on SCr of 1.88 mg/dL (H)). Liver Function Tests: Recent Labs  Lab 08/16/20 1945 08/17/20 0251  AST 44* 39  ALT 22 22  ALKPHOS 87 88  BILITOT 1.4* 1.1  PROT 7.1 6.5  ALBUMIN 2.3* 2.0*   No results for input(s): LIPASE, AMYLASE in the  last 168 hours. Recent Labs  Lab 08/16/20 1945  AMMONIA 18   Coagulation Profile: Recent Labs  Lab 08/16/20 1945 08/17/20 0251  INR 2.3* 2.3*   Cardiac Enzymes: No results for input(s): CKTOTAL, CKMB, CKMBINDEX, TROPONINI in the last 168 hours. BNP (last 3 results) No results for input(s): PROBNP in the last 8760 hours. HbA1C: No results for input(s): HGBA1C in the last 72 hours. CBG: Recent Labs  Lab 08/17/20 0613 08/17/20 1109 08/17/20 1615 08/17/20 2205 08/18/20 0558  GLUCAP 105* 121* 104* 163* 98   Lipid Profile: No results for input(s): CHOL, HDL, LDLCALC, TRIG, CHOLHDL, LDLDIRECT in the last 72 hours. Thyroid Function Tests: Recent Labs    08/16/20 1945  TSH 3.185   Anemia Panel: No results for input(s): VITAMINB12, FOLATE, FERRITIN, TIBC, IRON, RETICCTPCT in the last 72 hours. Sepsis Labs: Recent Labs  Lab 08/16/20 1945 08/16/20 2300 08/17/20 0251 08/17/20 0508  LATICACIDVEN 3.1* 2.9* 2.2* 1.5    Recent Results (from the past 240 hour(s))  Respiratory Panel by RT PCR (Flu A&B, Covid) - Nasopharyngeal Swab     Status: None   Collection Time: 08/16/20  7:46 PM   Specimen: Nasopharyngeal Swab; Nasopharyngeal(NP) swabs in vial transport medium  Result Value Ref Range Status   SARS Coronavirus 2 by RT PCR NEGATIVE NEGATIVE Final     Comment: (NOTE) SARS-CoV-2 target nucleic acids are NOT DETECTED.  The SARS-CoV-2 RNA is generally detectable in upper respiratoy specimens during the acute phase of infection. The lowest concentration of SARS-CoV-2 viral copies this assay can detect is 131 copies/mL. A negative result does not preclude SARS-Cov-2 infection and should not be used as the sole basis for treatment or other patient management decisions. A negative result may occur with  improper specimen collection/handling, submission of specimen other than nasopharyngeal swab, presence of viral mutation(s) within the areas targeted by this assay, and inadequate number of viral copies (<131 copies/mL). A negative result must be combined with clinical observations, patient history, and epidemiological information. The expected result is Negative.  Fact Sheet for Patients:  PinkCheek.be  Fact Sheet for Healthcare Providers:  GravelBags.it  This test is no t yet approved or cleared by the [place] FDA and  has been authorized for detection and/or diagnosis of SARS-CoV-2 by FDA under an Emergency Use Authorization (EUA). This EUA will remain  in effect (meaning this test can be used) for the duration of the COVID-19 declaration under Section 564(b)(1) of the Act, 21 U.S.C. section 360bbb-3(b)(1), unless the authorization is terminated or revoked sooner.     Influenza A by PCR NEGATIVE NEGATIVE Final   Influenza B by PCR NEGATIVE NEGATIVE Final    Comment: (NOTE) The Xpert Xpress SARS-CoV-2/FLU/RSV assay is intended as an aid in  the diagnosis of influenza from Nasopharyngeal swab specimens and  should not be used as a sole basis for treatment. Nasal washings and  aspirates are unacceptable for Xpert Xpress SARS-CoV-2/FLU/RSV  testing.  Fact Sheet for Patients: PinkCheek.be  Fact Sheet for Healthcare  Providers: GravelBags.it  This test is not yet approved or cleared by the [place] FDA and  has been authorized for detection and/or diagnosis of SARS-CoV-2 by  FDA under an Emergency Use Authorization (EUA). This EUA will remain  in effect (meaning this test can be used) for the duration of the  Covid-19 declaration under Section 564(b)(1) of the Act, 21  U.S.C. section 360bbb-3(b)(1), unless the authorization is  terminated or revoked. Performed at [facility]  Lab, [address], [place], [place] [address]   Blood Culture (routine x 2)     Status: None (Preliminary result)   Collection Time: 08/16/20 11:00 PM   Specimen: BLOOD LEFT HAND  Result Value Ref Range Status   Specimen Description BLOOD LEFT HAND  Final   Special Requests   Final    BOTTLES DRAWN AEROBIC AND ANAEROBIC Blood Culture results may not be optimal due to an inadequate volume of blood received in culture bottles   Culture   Final    NO GROWTH < 12 HOURS Performed at [facility], [place] [address] [place], [place] [address]    Report Status PENDING  Incomplete  Blood Culture (routine x 2)     Status: None (Preliminary result)   Collection Time: 08/17/20  2:52 AM   Specimen: BLOOD  Result Value Ref Range Status   Specimen Description BLOOD LEFT ANTECUBITAL  Final   Special Requests   Final    BOTTLES DRAWN AEROBIC AND ANAEROBIC Blood Culture adequate volume   Culture   Final    NO GROWTH < 12 HOURS Performed at [facility], [place] [address], [place], [place] [address]    Report Status PENDING  Incomplete         Radiology Studies: CT Head Wo Contrast  Result Date: 08/16/2020 CLINICAL DATA:  Altered mental status. EXAM: CT HEAD WITHOUT CONTRAST TECHNIQUE: Contiguous axial images were obtained from the base of the skull through the vertex without intravenous contrast. COMPARISON:  July 05, 2020 FINDINGS: Brain: There is mild cerebral atrophy with widening  of the extra-axial spaces and ventricular dilatation. There are areas of decreased attenuation within the white matter tracts of the supratentorial brain, consistent with microvascular disease changes. Vascular: No hyperdense vessel or unexpected calcification. Skull: Normal. Negative for fracture or focal lesion. Sinuses/Orbits: No acute finding. Other: None. IMPRESSION: 1. Generalized cerebral atrophy. 2. No acute intracranial abnormality. Electronically Signed   By: Virgina Norfolk M.D.   On: 08/16/2020 21:04   DG Chest Port 1 View  Result Date: 08/16/2020 CLINICAL DATA:  Sepsis EXAM: PORTABLE CHEST 1 VIEW COMPARISON:  None. FINDINGS: The heart size and mediastinal contours are within normal limits. Aortic knob calcifications are seen. Both lungs are clear. The visualized skeletal structures are unremarkable. IMPRESSION: No active disease. Electronically Signed   By: Prudencio Pair M.D.   On: 08/16/2020 19:45        Scheduled Meds:  apixaban  5 mg Oral BID   Chlorhexidine Gluconate Cloth  6 each Topical Daily   finasteride  5 mg Oral Daily   insulin aspart  0-15 Units Subcutaneous TID WC   pneumococcal 23 valent vaccine  0.5 mL Intramuscular Tomorrow-1000   sodium chloride flush  3 mL Intravenous Q12H   tamsulosin  0.4 mg Oral Daily   Continuous Infusions:  ceFEPime (MAXIPIME) IV 2 g (08/17/20 2011)   magnesium sulfate bolus IVPB     potassium chloride 10 mEq (08/18/20 0630)     LOS: 2 days   Time spent: 52min   Syndey Jaskolski C Milliani Herrada, DO [facility]  If 7PM-7AM, please contact night-coverage [email or web address]  08/18/2020, 7:32 AM

## 2020-08-19 DIAGNOSIS — A419 Sepsis, unspecified organism: Secondary | ICD-10-CM | POA: Diagnosis not present

## 2020-08-19 DIAGNOSIS — Z978 Presence of other specified devices: Secondary | ICD-10-CM

## 2020-08-19 DIAGNOSIS — N390 Urinary tract infection, site not specified: Secondary | ICD-10-CM | POA: Diagnosis not present

## 2020-08-19 DIAGNOSIS — Z86711 Personal history of pulmonary embolism: Secondary | ICD-10-CM

## 2020-08-19 LAB — BASIC METABOLIC PANEL
Anion gap: 9 (ref 5–15)
BUN: 22 mg/dL (ref 8–23)
CO2: 25 mmol/L (ref 22–32)
Calcium: 8.7 mg/dL — ABNORMAL LOW (ref 8.9–10.3)
Chloride: 111 mmol/L (ref 98–111)
Creatinine, Ser: 1.68 mg/dL — ABNORMAL HIGH (ref 0.61–1.24)
GFR, Estimated: 40 mL/min — ABNORMAL LOW (ref >60)
Glucose, Bld: 136 mg/dL — ABNORMAL HIGH (ref 70–99)
Potassium: 3.1 mmol/L — ABNORMAL LOW (ref 3.5–5.1)
Sodium: 145 mmol/L (ref 135–145)

## 2020-08-19 LAB — CBC
HCT: 28.6 % — ABNORMAL LOW (ref 39.0–52.0)
Hemoglobin: 9.4 g/dL — ABNORMAL LOW (ref 13.0–17.0)
MCH: 27.1 pg (ref 26.0–34.0)
MCHC: 32.9 g/dL (ref 30.0–36.0)
MCV: 82.4 fL (ref 80.0–100.0)
Platelets: 140 10*3/uL — ABNORMAL LOW (ref 150–400)
RBC: 3.47 MIL/uL — ABNORMAL LOW (ref 4.22–5.81)
RDW: 15.3 % (ref 11.5–15.5)
WBC: 11.4 10*3/uL — ABNORMAL HIGH (ref 4.0–10.5)
nRBC: 0 % (ref 0.0–0.2)

## 2020-08-19 LAB — GLUCOSE, CAPILLARY
Glucose-Capillary: 116 mg/dL — ABNORMAL HIGH (ref 70–99)
Glucose-Capillary: 181 mg/dL — ABNORMAL HIGH (ref 70–99)

## 2020-08-19 LAB — MAGNESIUM: Magnesium: 2.3 mg/dL (ref 1.7–2.4)

## 2020-08-19 MED ORDER — CEFDINIR 300 MG PO CAPS: 300.0000 mg | ORAL_CAPSULE | Freq: Every day | ORAL | 0 refills | Status: AC

## 2020-08-19 MED ORDER — POTASSIUM CHLORIDE CRYS ER 20 MEQ PO TBCR
40.0000 meq | EXTENDED_RELEASE_TABLET | Freq: Once | ORAL | Status: AC
  Administered 2020-08-19: 40 meq via ORAL
  Filled 2020-08-19: qty 2

## 2020-08-19 MED ORDER — CEFDINIR 300 MG PO CAPS: 300.0000 mg | ORAL_CAPSULE | Freq: Every day | ORAL | Status: DC

## 2020-08-19 MED ORDER — APIXABAN 5 MG PO TABS
5.0000 mg | ORAL_TABLET | Freq: Two times a day (BID) | ORAL | 2 refills | Status: DC
Start: 2020-08-19 — End: 2022-04-10

## 2020-08-19 MED ORDER — CEFDINIR 300 MG PO CAPS
300.0000 mg | ORAL_CAPSULE | Freq: Every day | ORAL | Status: DC
  Administered 2020-08-19: 300 mg via ORAL
  Filled 2020-08-19: qty 1

## 2020-08-19 NOTE — Discharge Summary (Signed)
Physician Discharge Summary   Sergio [name] FIE:[id number] DOB: 26-Aug-1933 DOA: 08/16/2020  PCP: Forrest Moron, MD  Admit date: 08/16/2020 Discharge date: 08/19/2020  Admitted From: home Disposition:  home Discharging physician: Dwyane Dee, MD  Recommendations for Outpatient Follow-up:  1. Repeat BMP to reassess renal function   Patient discharged to home in Discharge Condition: stable CODE STATUS: Partial Diet recommendation:  Diet Orders (From admission, onward)    Start     Ordered   08/19/20 0000  Diet - low sodium heart healthy        08/19/20 1037   08/19/20 0000  Diet Carb Modified        08/19/20 1037   08/17/20 0332  Diet heart healthy/carb modified Room service appropriate? Yes; Fluid consistency: Thin  Diet effective now       Question Answer Comment  Diet-HS Snack? Nothing   Room service appropriate? Yes   Fluid consistency: Thin      08/17/20 0331          Hospital Course: Sergio [name] is a 86 y.o. male with medical history significant of diabetes, BPH status post chronic Foley secondary to hydronephrosis, hyperlipidemia, PE on Eliquis who presents after 2 days of lethargy and new altered mental status.  Patient continues to have some altered mental status and is unable to fully participate in HPI history is obtained with the assistance of the patient's son and chart.  He has been somewhat altered and confused the last few days and has been more lethargic.  His son found him on the ground this morning as if he had been sleeping on the ground.  He had not complained of anything in the last several days.  When I saw patient he would say yes and no to some questions and was alert to self and would say some complete sentences. Patient initially tachycardic to 122, tachypneic to 24, febrile to 103; vitals have improved with fever control and IV fluids.  Lab work showed creatinine of 3.1 from a baseline of 1.5, glucose 179, calcium 8.7 which corrects  considering albumin of 2.3, T bili 1.4.  CBC with leukocytosis to 21 and Hgb stable at 11.  PT PTT T and INR all elevated in the setting of Eliquis use.  TSH and ammonia within normal limits.  ABG showed normal pH.  Urinalysis was consistent with UTI and frank pus was noted in catheter bag.  Urine culture and blood cultures were obtained respiratory panel negative for flu and Covid.  Patient received 2 L IV fluids, vancomycin and cefepime in the ED.    Patient admitted as above with transient episode of confusion in the setting of sepsis and UTI with notable AKI on CKD 2.  Patient rapidly improved, mental status back to baseline.  His urine culture did not speciate out any organism. He was continued on empiric treatment during hospitalization and discharged with Omnicef to complete a course at discharge.  His AKI also showed improvement with IVF. Creatinine improved down to 1.68 at time of discharge.    * Sepsis due to urinary tract infection ([place]) - Patient meets criteria for severe sepsis with tachycardia, tachypnea, fever to 103, WBC 21. Meets severe sepsis due to acute renal failure and lactic acidosis. - Urinary source is most likely etiology given frank pus and abnormal UA - Foley was exchanged in the ED per sign out - Afebrile - leukocytosis imroving -Continue cefepime - DC vancomycin in the setting of AKI -Follow  cultures - de-escalate antibiotics as possible  History of pulmonary embolus (PE) Continue Eliquis  Chronic indwelling Foley catheter Foley in place -Continue tamsulosin and finasteride - home medications  AKI (acute kidney injury) ([place]) - Creatinine of 3.14 from a baseline of about 1.5;in the setting of sepsis - STOP vancomycin as above - Continue IVF - encouraged increased PO intake  Controlled diabetes mellitus without long-term current use of insulin ([place]) Hold home oral medications - SSI  Uncontrolled hypertension -Hold home amlodipine and hydralazine  in the setting of sepsis and borderline hypotension    The patient's chronic medical conditions were treated accordingly per the patient's home medication regimen except as noted.  On day of discharge, patient was felt deemed stable for discharge. Patient/family member advised to call PCP or come back to ER if needed.  Principal Diagnosis: Sepsis due to urinary tract infection [facility])  Discharge Diagnoses: Active Hospital Problems   Diagnosis Date Noted  . Sepsis due to urinary tract infection ([place]) 08/16/2020    Priority: High  . Chronic indwelling Foley catheter 08/19/2020  . History of pulmonary embolus (PE) 08/19/2020  . AKI (acute kidney injury) ([place]) 06/30/2020  . Type 2 diabetes mellitus without complication, without long-term current use of insulin ([place]) 10/01/2017  . Controlled diabetes mellitus without long-term current use of insulin ([place]) 12/18/2011  . Uncontrolled hypertension     Resolved Hospital Problems  No resolved problems to display.    Discharge Instructions    Diet - low sodium heart healthy   Complete by: As directed    Diet Carb Modified   Complete by: As directed    Increase activity slowly   Complete by: As directed      Allergies as of 08/19/2020   No Known Allergies     Medication List    STOP taking these medications   metFORMIN 500 MG tablet Commonly known as: GLUCOPHAGE     TAKE these medications   acetaminophen 500 MG tablet Commonly known as: TYLENOL Take 500 mg by mouth every 6 (six) hours as needed (pain).   amLODipine 10 MG tablet Commonly known as: NORVASC Take 1 tablet (10 mg total) by mouth daily.   apixaban 5 MG Tabs tablet Commonly known as: ELIQUIS Take 1 tablet (5 mg total) by mouth 2 (two) times daily.   cefdinir 300 MG capsule Commonly known as: OMNICEF Take 1 capsule (300 mg total) by mouth daily for 6 days. Start taking on: August 20, 2020   finasteride 5 MG tablet Commonly known as: Proscar Take 1 tablet  (5 mg total) by mouth daily.   hydrALAZINE 25 MG tablet Commonly known as: APRESOLINE Take 1 tablet (25 mg total) by mouth 3 (three) times daily.   ondansetron 4 MG tablet Commonly known as: ZOFRAN Take 1 tablet (4 mg total) by mouth every 8 (eight) hours as needed for nausea or vomiting.   tamsulosin 0.4 MG Caps capsule Commonly known as: FLOMAX Take 1 capsule (0.4 mg total) by mouth daily.   traMADol 50 MG tablet Commonly known as: ULTRAM Take 50 mg by mouth every 8 (eight) hours as needed (pain).       Follow-up Information    Forrest Moron, MD. Go on 08/28/2020.   Specialty: Internal Medicine Why: @2 :00pm will come to the house Contact information: [address] [place] [address] [phone]              No Known Allergies  Consultations: none  Discharge Exam: BP (!) 145/77 (BP Location: Right Arm)   Pulse 78   Temp (!) 97.5 F (36.4 C) (Oral)   Resp 17   Ht 5' (1.524 m)   Wt 59.5 kg   SpO2 98%   BMI 25.62 kg/m  General appearance: alert, cooperative and no distress Head: Normocephalic, without obvious abnormality, atraumatic Eyes: EOMI Lungs: clear to auscultation bilaterally Heart: regular rate and rhythm and S1, S2 normal Abdomen: normal findings: bowel sounds normal and soft, non-tender Extremities: no edema Skin: mobility and turgor normal Neurologic: Grossly normal  The results of significant diagnostics from this hospitalization (including imaging, microbiology, ancillary and laboratory) are listed below for reference.   Microbiology: Recent Results (from the past 240 hour(s))  Urine culture     Status: Abnormal (Preliminary result)   Collection Time: 08/16/20  7:45 PM   Specimen: In/Out Cath Urine  Result Value Ref Range Status   Specimen Description IN/OUT CATH URINE  Final   Special Requests   Final    NONE Performed at [facility], [address], [place], [place] [address]    Culture  MULTIPLE SPECIES PRESENT, SUGGEST RECOLLECTION (A)  Final   Report Status PENDING  Incomplete  Respiratory Panel by RT PCR (Flu A&B, Covid) - Nasopharyngeal Swab     Status: None   Collection Time: 08/16/20  7:46 PM   Specimen: Nasopharyngeal Swab; Nasopharyngeal(NP) swabs in vial transport medium  Result Value Ref Range Status   SARS Coronavirus 2 by RT PCR NEGATIVE NEGATIVE Final    Comment: (NOTE) SARS-CoV-2 target nucleic acids are NOT DETECTED.  The SARS-CoV-2 RNA is generally detectable in upper respiratoy specimens during the acute phase of infection. The lowest concentration of SARS-CoV-2 viral copies this assay can detect is 131 copies/mL. A negative result does not preclude SARS-Cov-2 infection and should not be used as the sole basis for treatment or other patient management decisions. A negative result may occur with  improper specimen collection/handling, submission of specimen other than nasopharyngeal swab, presence of viral mutation(s) within the areas targeted by this assay, and inadequate number of viral copies (<131 copies/mL). A negative result must be combined with clinical observations, patient history, and epidemiological information. The expected result is Negative.  Fact Sheet for Patients:  PinkCheek.be  Fact Sheet for Healthcare Providers:  GravelBags.it  This test is no t yet approved or cleared by the [place] FDA and  has been authorized for detection and/or diagnosis of SARS-CoV-2 by FDA under an Emergency Use Authorization (EUA). This EUA will remain  in effect (meaning this test can be used) for the duration of the COVID-19 declaration under Section 564(b)(1) of the Act, 21 U.S.C. section 360bbb-3(b)(1), unless the authorization is terminated or revoked sooner.     Influenza A by PCR NEGATIVE NEGATIVE Final   Influenza B by PCR NEGATIVE NEGATIVE Final    Comment: (NOTE) The Xpert  Xpress SARS-CoV-2/FLU/RSV assay is intended as an aid in  the diagnosis of influenza from Nasopharyngeal swab specimens and  should not be used as a sole basis for treatment. Nasal washings and  aspirates are unacceptable for Xpert Xpress SARS-CoV-2/FLU/RSV  testing.  Fact Sheet for Patients: PinkCheek.be  Fact Sheet for Healthcare Providers: GravelBags.it  This test is not yet approved or cleared by the [place] FDA and  has been authorized for detection and/or diagnosis of SARS-CoV-2 by  FDA under an Emergency Use Authorization (EUA). This EUA will remain  in  effect (meaning this test can be used) for the duration of the  Covid-19 declaration under Section 564(b)(1) of the Act, 21  U.S.C. section 360bbb-3(b)(1), unless the authorization is  terminated or revoked. Performed at [facility], [place] [address] [place], [place] [address]   Blood Culture (routine x 2)     Status: None (Preliminary result)   Collection Time: 08/16/20 11:00 PM   Specimen: BLOOD LEFT HAND  Result Value Ref Range Status   Specimen Description BLOOD LEFT HAND  Final   Special Requests   Final    BOTTLES DRAWN AEROBIC AND ANAEROBIC Blood Culture results may not be optimal due to an inadequate volume of blood received in culture bottles   Culture   Final    NO GROWTH 3 DAYS Performed at [facility], [place] [address] [place], [place] [address]    Report Status PENDING  Incomplete  Blood Culture (routine x 2)     Status: None (Preliminary result)   Collection Time: 08/17/20  2:52 AM   Specimen: BLOOD  Result Value Ref Range Status   Specimen Description BLOOD LEFT ANTECUBITAL  Final   Special Requests   Final    BOTTLES DRAWN AEROBIC AND ANAEROBIC Blood Culture adequate volume   Culture   Final    NO GROWTH 2 DAYS Performed at [facility], [place] [address], [place], [place] [address]    Report Status PENDING  Incomplete       Labs: BNP (last 3 results) No results for input(s): BNP in the last 8760 hours. Basic Metabolic Panel: Recent Labs  Lab 08/16/20 1945 08/16/20 1945 08/16/20 2002 08/17/20 0251 08/18/20 0451 08/18/20 1147 08/19/20 0446  NA 138   < > 140 141 146* 143 145  K 3.9   < > 3.7 3.7 2.7* 2.8* 3.1*  CL 102  --   --  106 111 109 111  CO2 22  --   --  24 26 26 25   GLUCOSE 179*  --   --  129* 118* 247* 136*  BUN 50*  --   --  42* 28* 25* 22  CREATININE 3.14*  --   --  2.49* 1.88* 1.74* 1.68*  CALCIUM 8.7*  --   --  8.5* 8.7* 8.7* 8.7*  MG  --   --   --   --   --   --  2.3   < > = values in this interval not displayed.   Liver Function Tests: Recent Labs  Lab 08/16/20 1945 08/17/20 0251  AST 44* 39  ALT 22 22  ALKPHOS 87 88  BILITOT 1.4* 1.1  PROT 7.1 6.5  ALBUMIN 2.3* 2.0*   No results for input(s): LIPASE, AMYLASE in the last 168 hours. Recent Labs  Lab 08/16/20 1945  AMMONIA 18   CBC: Recent Labs  Lab 08/16/20 1945 08/16/20 2002 08/17/20 0251 08/18/20 0451 08/19/20 0446  WBC 21.0*  --  26.6* 18.6* 11.4*  NEUTROABS 20.2*  --   --   --   --   HGB 11.3* 10.2* 9.9* 9.8* 9.4*  HCT 35.8* 30.0* 30.6* 29.7* 28.6*  MCV 86.1  --  84.1 83.0 82.4  PLT 183  --  142* 126* 140*   Cardiac Enzymes: No results for input(s): CKTOTAL, CKMB, CKMBINDEX, TROPONINI in the last 168 hours. BNP: Invalid input(s): POCBNP CBG: Recent Labs  Lab 08/18/20 1059 08/18/20 1556 08/18/20 2142 08/19/20 0611 08/19/20 1137  GLUCAP 252* 83 166*  116* 181*   D-Dimer No results for input(s): DDIMER in the last 72 hours. Hgb A1c No results for input(s): HGBA1C in the last 72 hours. Lipid Profile No results for input(s): CHOL, HDL, LDLCALC, TRIG, CHOLHDL, LDLDIRECT in the last 72 hours. Thyroid function studies Recent Labs    08/16/20 1945  TSH 3.185   Anemia work up No results for input(s): VITAMINB12, FOLATE, FERRITIN, TIBC, IRON, RETICCTPCT in the last 72 hours. Urinalysis     Component Value Date/Time   COLORURINE YELLOW 08/16/2020 1945   APPEARANCEUR TURBID (A) 08/16/2020 1945   LABSPEC 1.008 08/16/2020 1945   PHURINE 5.0 08/16/2020 1945   GLUCOSEU NEGATIVE 08/16/2020 1945   HGBUR MODERATE (A) 08/16/2020 1945   BILIRUBINUR NEGATIVE 08/16/2020 1945   BILIRUBINUR negative 10/01/2017 0901   BILIRUBINUR neg 04/02/2014 [place] 08/16/2020 1945   PROTEINUR 100 (A) 08/16/2020 1945   UROBILINOGEN 0.2 10/01/2017 0901   UROBILINOGEN 0.2 02/12/2015 2205   NITRITE NEGATIVE 08/16/2020 1945   LEUKOCYTESUR MODERATE (A) 08/16/2020 1945   Sepsis Labs Invalid input(s): PROCALCITONIN,  WBC,  LACTICIDVEN Microbiology Recent Results (from the past 240 hour(s))  Urine culture     Status: Abnormal (Preliminary result)   Collection Time: 08/16/20  7:45 PM   Specimen: In/Out Cath Urine  Result Value Ref Range Status   Specimen Description IN/OUT CATH URINE  Final   Special Requests   Final    NONE Performed at [facility], [place] [address], [place], [place] [address]    Culture MULTIPLE SPECIES PRESENT, SUGGEST RECOLLECTION (A)  Final   Report Status PENDING  Incomplete  Respiratory Panel by RT PCR (Flu A&B, Covid) - Nasopharyngeal Swab     Status: None   Collection Time: 08/16/20  7:46 PM   Specimen: Nasopharyngeal Swab; Nasopharyngeal(NP) swabs in vial transport medium  Result Value Ref Range Status   SARS Coronavirus 2 by RT PCR NEGATIVE NEGATIVE Final    Comment: (NOTE) SARS-CoV-2 target nucleic acids are NOT DETECTED.  The SARS-CoV-2 RNA is generally detectable in upper respiratoy specimens during the acute phase of infection. The lowest concentration of SARS-CoV-2 viral copies this assay can detect is 131 copies/mL. A negative result does not preclude SARS-Cov-2 infection and should not be used as the sole basis for treatment or other patient management decisions. A negative result may occur with  improper specimen collection/handling,  submission of specimen other than nasopharyngeal swab, presence of viral mutation(s) within the areas targeted by this assay, and inadequate number of viral copies (<131 copies/mL). A negative result must be combined with clinical observations, patient history, and epidemiological information. The expected result is Negative.  Fact Sheet for Patients:  PinkCheek.be  Fact Sheet for Healthcare Providers:  GravelBags.it  This test is no t yet approved or cleared by the [place] FDA and  has been authorized for detection and/or diagnosis of SARS-CoV-2 by FDA under an Emergency Use Authorization (EUA). This EUA will remain  in effect (meaning this test can be used) for the duration of the COVID-19 declaration under Section 564(b)(1) of the Act, 21 U.S.C. section 360bbb-3(b)(1), unless the authorization is terminated or revoked sooner.     Influenza A by PCR NEGATIVE NEGATIVE Final   Influenza B by PCR NEGATIVE NEGATIVE Final    Comment: (NOTE) The Xpert Xpress SARS-CoV-2/FLU/RSV assay is intended as an aid in  the diagnosis of influenza from Nasopharyngeal swab specimens and  should not be used as a sole basis  for treatment. Nasal washings and  aspirates are unacceptable for Xpert Xpress SARS-CoV-2/FLU/RSV  testing.  Fact Sheet for Patients: PinkCheek.be  Fact Sheet for Healthcare Providers: GravelBags.it  This test is not yet approved or cleared by the [place] FDA and  has been authorized for detection and/or diagnosis of SARS-CoV-2 by  FDA under an Emergency Use Authorization (EUA). This EUA will remain  in effect (meaning this test can be used) for the duration of the  Covid-19 declaration under Section 564(b)(1) of the Act, 21  U.S.C. section 360bbb-3(b)(1), unless the authorization is  terminated or revoked. Performed at [facility], [place] [address] [place], [place] [address]   Blood Culture (routine x 2)     Status: None (Preliminary result)   Collection Time: 08/16/20 11:00 PM   Specimen: BLOOD LEFT HAND  Result Value Ref Range Status   Specimen Description BLOOD LEFT HAND  Final   Special Requests   Final    BOTTLES DRAWN AEROBIC AND ANAEROBIC Blood Culture results may not be optimal due to an inadequate volume of blood received in culture bottles   Culture   Final    NO GROWTH 3 DAYS Performed at [facility], [place] [address] [place], [place] [address]    Report Status PENDING  Incomplete  Blood Culture (routine x 2)     Status: None (Preliminary result)   Collection Time: 08/17/20  2:52 AM   Specimen: BLOOD  Result Value Ref Range Status   Specimen Description BLOOD LEFT ANTECUBITAL  Final   Special Requests   Final    BOTTLES DRAWN AEROBIC AND ANAEROBIC Blood Culture adequate volume   Culture   Final    NO GROWTH 2 DAYS Performed at [facility], [place] [address], [place],  [address]    Report Status PENDING  Incomplete    Procedures/Studies: CT Head Wo Contrast  Result Date: 08/16/2020 CLINICAL DATA:  Altered mental status. EXAM: CT HEAD WITHOUT CONTRAST TECHNIQUE: Contiguous axial images were obtained from the base of the skull through the vertex without intravenous contrast. COMPARISON:  July 05, 2020 FINDINGS: Brain: There is mild cerebral atrophy with widening of the extra-axial spaces and ventricular dilatation. There are areas of decreased attenuation within the white matter tracts of the supratentorial brain, consistent with microvascular disease changes. Vascular: No hyperdense vessel or unexpected calcification. Skull: Normal. Negative for fracture or focal lesion. Sinuses/Orbits: No acute finding. Other: None. IMPRESSION: 1. Generalized cerebral atrophy. 2. No acute intracranial abnormality. Electronically Signed   By: Virgina Norfolk M.D.   On: 08/16/2020 21:04   DG Chest Port 1  View  Result Date: 08/16/2020 CLINICAL DATA:  Sepsis EXAM: PORTABLE CHEST 1 VIEW COMPARISON:  None. FINDINGS: The heart size and mediastinal contours are within normal limits. Aortic knob calcifications are seen. Both lungs are clear. The visualized skeletal structures are unremarkable. IMPRESSION: No active disease. Electronically Signed   By: Prudencio Pair M.D.   On: 08/16/2020 19:45     Time coordinating discharge: Over 30 minutes    Dwyane Dee, MD  [facility] 08/19/2020, 2:38 PM

## 2020-08-19 NOTE — Assessment & Plan Note (Signed)
-   Creatinine of 3.14 from a baseline of about 1.5;in the setting of sepsis - STOP vancomycin as above - Continue IVF - encouraged increased PO intake

## 2020-08-19 NOTE — Assessment & Plan Note (Signed)
-   Patient meets criteria for severe sepsis with tachycardia, tachypnea, fever to 103, WBC 21. Meets severe sepsis due to acute renal failure and lactic acidosis. - Urinary source is most likely etiology given frank pus and abnormal UA - Foley was exchanged in the ED per sign out - Afebrile - leukocytosis imroving -Continue cefepime - DC vancomycin in the setting of AKI -Follow cultures - de-escalate antibiotics as possible

## 2020-08-19 NOTE — Assessment & Plan Note (Signed)
-  Hold home amlodipine and hydralazine in the setting of sepsis and borderline hypotension

## 2020-08-19 NOTE — Hospital Course (Addendum)
Sergio [name] is a 86 y.o. male with medical history significant of diabetes, BPH status post chronic Foley secondary to hydronephrosis, hyperlipidemia, PE on Eliquis who presents after 2 days of lethargy and new altered mental status.  Patient continues to have some altered mental status and is unable to fully participate in HPI history is obtained with the assistance of the patient's son and chart.  He has been somewhat altered and confused the last few days and has been more lethargic.  His son found him on the ground this morning as if he had been sleeping on the ground.  He had not complained of anything in the last several days.  When I saw patient he would say yes and no to some questions and was alert to self and would say some complete sentences. Patient initially tachycardic to 122, tachypneic to 24, febrile to 103; vitals have improved with fever control and IV fluids.  Lab work showed creatinine of 3.1 from a baseline of 1.5, glucose 179, calcium 8.7 which corrects considering albumin of 2.3, T bili 1.4.  CBC with leukocytosis to 21 and Hgb stable at 11.  PT PTT T and INR all elevated in the setting of Eliquis use.  TSH and ammonia within normal limits.  ABG showed normal pH.  Urinalysis was consistent with UTI and frank pus was noted in catheter bag.  Urine culture and blood cultures were obtained respiratory panel negative for flu and Covid.  Patient received 2 L IV fluids, vancomycin and cefepime in the ED.    Patient admitted as above with transient episode of confusion in the setting of sepsis and UTI with notable AKI on CKD 2.  Patient rapidly improved, mental status back to baseline.  His urine culture did not speciate out any organism. He was continued on empiric treatment during hospitalization and discharged with Omnicef to complete a course at discharge.  His AKI also showed improvement with IVF. Creatinine improved down to 1.68 at time of discharge.

## 2020-08-19 NOTE — Assessment & Plan Note (Signed)
-   Continue Eliquis 

## 2020-08-19 NOTE — Progress Notes (Signed)
D/C instructions reviewed with son at bedside. No questions asked but encouraged to call with any concerns.

## 2020-08-19 NOTE — Assessment & Plan Note (Signed)
Hold home oral medications - SSI

## 2020-08-19 NOTE — Progress Notes (Signed)
Inpatient Diabetes Program Recommendations  AACE/ADA: New Consensus Statement on Inpatient Glycemic Control (2015)  Target Ranges:  Prepandial:   less than 140 mg/dL      Peak postprandial:   less than 180 mg/dL (1-2 hours)      Critically ill patients:  140 - 180 mg/dL   Lab Results  Component Value Date   GLUCAP 116 (H) 08/19/2020   HGBA1C 6.7 (H) 06/30/2020    Review of Glycemic Control Results for CHRISTPHOR, GROFT (MRN [id number]) as of 08/19/2020 11:00  Ref. Range 08/18/2020 05:58 08/18/2020 10:59 08/18/2020 15:56 08/18/2020 21:42 08/19/2020 06:11  Glucose-Capillary Latest Ref Range: 70 - 99 mg/dL 98 252 (H) 83 166 (H) 116 (H)   Diabetes history: DM 2 Outpatient Diabetes medications: Metformin 500 mg daily Current orders for Inpatient glycemic control:  Novolog moderate tid with meals  Inpatient Diabetes Program Recommendations:   Please consider reducing Novolog correction to sensitive tid with meals.   Thanks Adah Perl, RN, BC-ADM Inpatient Diabetes Coordinator Pager [phone] (8a-5p)

## 2020-08-19 NOTE — Progress Notes (Signed)
Attempted to call son to inform of D/C and discuss D/C instructions but did not answer. Voicemail left to return call.

## 2020-08-19 NOTE — Assessment & Plan Note (Signed)
Foley in place -Continue tamsulosin and finasteride - home medications

## 2020-08-20 LAB — URINE CULTURE

## 2020-08-21 LAB — CULTURE, BLOOD (ROUTINE X 2): Culture: NO GROWTH

## 2020-08-22 LAB — CULTURE, BLOOD (ROUTINE X 2)
Culture: NO GROWTH
Special Requests: ADEQUATE

## 2020-08-23 DIAGNOSIS — E7849 Other hyperlipidemia: Secondary | ICD-10-CM | POA: Diagnosis not present

## 2020-08-23 DIAGNOSIS — Z87891 Personal history of nicotine dependence: Secondary | ICD-10-CM | POA: Diagnosis not present

## 2020-08-23 DIAGNOSIS — S81001D Unspecified open wound, right knee, subsequent encounter: Secondary | ICD-10-CM | POA: Diagnosis not present

## 2020-08-23 DIAGNOSIS — N138 Other obstructive and reflux uropathy: Secondary | ICD-10-CM | POA: Diagnosis not present

## 2020-08-23 DIAGNOSIS — N134 Hydroureter: Secondary | ICD-10-CM | POA: Diagnosis not present

## 2020-08-23 DIAGNOSIS — Z86010 Personal history of colonic polyps: Secondary | ICD-10-CM | POA: Diagnosis not present

## 2020-08-23 DIAGNOSIS — Z7984 Long term (current) use of oral hypoglycemic drugs: Secondary | ICD-10-CM | POA: Diagnosis not present

## 2020-08-23 DIAGNOSIS — D631 Anemia in chronic kidney disease: Secondary | ICD-10-CM | POA: Diagnosis not present

## 2020-08-23 DIAGNOSIS — Z7901 Long term (current) use of anticoagulants: Secondary | ICD-10-CM | POA: Diagnosis not present

## 2020-08-23 DIAGNOSIS — K8020 Calculus of gallbladder without cholecystitis without obstruction: Secondary | ICD-10-CM | POA: Diagnosis not present

## 2020-08-23 DIAGNOSIS — K5900 Constipation, unspecified: Secondary | ICD-10-CM | POA: Diagnosis not present

## 2020-08-23 DIAGNOSIS — N281 Cyst of kidney, acquired: Secondary | ICD-10-CM | POA: Diagnosis not present

## 2020-08-23 DIAGNOSIS — N401 Enlarged prostate with lower urinary tract symptoms: Secondary | ICD-10-CM | POA: Diagnosis not present

## 2020-08-23 DIAGNOSIS — Z466 Encounter for fitting and adjustment of urinary device: Secondary | ICD-10-CM | POA: Diagnosis not present

## 2020-08-23 DIAGNOSIS — Z9181 History of falling: Secondary | ICD-10-CM | POA: Diagnosis not present

## 2020-08-23 DIAGNOSIS — Z86718 Personal history of other venous thrombosis and embolism: Secondary | ICD-10-CM | POA: Diagnosis not present

## 2020-08-23 DIAGNOSIS — N1330 Unspecified hydronephrosis: Secondary | ICD-10-CM | POA: Diagnosis not present

## 2020-08-23 DIAGNOSIS — M10072 Idiopathic gout, left ankle and foot: Secondary | ICD-10-CM | POA: Diagnosis not present

## 2020-08-23 DIAGNOSIS — Z86711 Personal history of pulmonary embolism: Secondary | ICD-10-CM | POA: Diagnosis not present

## 2020-08-23 DIAGNOSIS — N1832 Chronic kidney disease, stage 3b: Secondary | ICD-10-CM | POA: Diagnosis not present

## 2020-08-23 DIAGNOSIS — E1122 Type 2 diabetes mellitus with diabetic chronic kidney disease: Secondary | ICD-10-CM | POA: Diagnosis not present

## 2020-08-23 DIAGNOSIS — M10071 Idiopathic gout, right ankle and foot: Secondary | ICD-10-CM | POA: Diagnosis not present

## 2020-08-23 DIAGNOSIS — I129 Hypertensive chronic kidney disease with stage 1 through stage 4 chronic kidney disease, or unspecified chronic kidney disease: Secondary | ICD-10-CM | POA: Diagnosis not present

## 2020-08-28 ENCOUNTER — Inpatient Hospital Stay (HOSPITAL_COMMUNITY)
Admission: EM | Admit: 2020-08-28 | Discharge: 2020-08-30 | DRG: 682 | Disposition: A | Discharge status: Home / Self Care | Payer: PPO | Attending: Internal Medicine | Admitting: Internal Medicine

## 2020-08-28 ENCOUNTER — Other Ambulatory Visit: Payer: Self-pay

## 2020-08-28 DIAGNOSIS — Z87440 Personal history of urinary (tract) infections: Secondary | ICD-10-CM | POA: Diagnosis not present

## 2020-08-28 DIAGNOSIS — N139 Obstructive and reflux uropathy, unspecified: Secondary | ICD-10-CM | POA: Diagnosis not present

## 2020-08-28 DIAGNOSIS — T839XXA Unspecified complication of genitourinary prosthetic device, implant and graft, initial encounter: Secondary | ICD-10-CM

## 2020-08-28 DIAGNOSIS — N1832 Chronic kidney disease, stage 3b: Secondary | ICD-10-CM | POA: Diagnosis not present

## 2020-08-28 DIAGNOSIS — D6859 Other primary thrombophilia: Secondary | ICD-10-CM | POA: Diagnosis not present

## 2020-08-28 DIAGNOSIS — N134 Hydroureter: Secondary | ICD-10-CM | POA: Diagnosis not present

## 2020-08-28 DIAGNOSIS — E1122 Type 2 diabetes mellitus with diabetic chronic kidney disease: Secondary | ICD-10-CM | POA: Diagnosis not present

## 2020-08-28 DIAGNOSIS — Y731 Therapeutic (nonsurgical) and rehabilitative gastroenterology and urology devices associated with adverse incidents: Secondary | ICD-10-CM | POA: Diagnosis present

## 2020-08-28 DIAGNOSIS — I10 Essential (primary) hypertension: Secondary | ICD-10-CM | POA: Diagnosis not present

## 2020-08-28 DIAGNOSIS — R34 Anuria and oliguria: Secondary | ICD-10-CM | POA: Diagnosis not present

## 2020-08-28 DIAGNOSIS — N400 Enlarged prostate without lower urinary tract symptoms: Secondary | ICD-10-CM | POA: Diagnosis present

## 2020-08-28 DIAGNOSIS — N179 Acute kidney failure, unspecified: Secondary | ICD-10-CM | POA: Diagnosis not present

## 2020-08-28 DIAGNOSIS — Z9181 History of falling: Secondary | ICD-10-CM | POA: Diagnosis not present

## 2020-08-28 DIAGNOSIS — Z20822 Contact with and (suspected) exposure to covid-19: Secondary | ICD-10-CM | POA: Diagnosis present

## 2020-08-28 DIAGNOSIS — Z466 Encounter for fitting and adjustment of urinary device: Secondary | ICD-10-CM | POA: Diagnosis not present

## 2020-08-28 DIAGNOSIS — N1330 Unspecified hydronephrosis: Secondary | ICD-10-CM | POA: Diagnosis not present

## 2020-08-28 DIAGNOSIS — Y846 Urinary catheterization as the cause of abnormal reaction of the patient, or of later complication, without mention of misadventure at the time of the procedure: Secondary | ICD-10-CM | POA: Diagnosis present

## 2020-08-28 DIAGNOSIS — T83028A Displacement of other indwelling urethral catheter, initial encounter: Secondary | ICD-10-CM | POA: Diagnosis present

## 2020-08-28 DIAGNOSIS — M10071 Idiopathic gout, right ankle and foot: Secondary | ICD-10-CM | POA: Diagnosis not present

## 2020-08-28 DIAGNOSIS — Z978 Presence of other specified devices: Secondary | ICD-10-CM

## 2020-08-28 DIAGNOSIS — Z79899 Other long term (current) drug therapy: Secondary | ICD-10-CM

## 2020-08-28 DIAGNOSIS — D631 Anemia in chronic kidney disease: Secondary | ICD-10-CM | POA: Diagnosis not present

## 2020-08-28 DIAGNOSIS — Z713 Dietary counseling and surveillance: Secondary | ICD-10-CM | POA: Diagnosis not present

## 2020-08-28 DIAGNOSIS — Z7901 Long term (current) use of anticoagulants: Secondary | ICD-10-CM

## 2020-08-28 DIAGNOSIS — Z87891 Personal history of nicotine dependence: Secondary | ICD-10-CM | POA: Diagnosis not present

## 2020-08-28 DIAGNOSIS — E7849 Other hyperlipidemia: Secondary | ICD-10-CM | POA: Diagnosis not present

## 2020-08-28 DIAGNOSIS — R319 Hematuria, unspecified: Secondary | ICD-10-CM | POA: Diagnosis present

## 2020-08-28 DIAGNOSIS — M10072 Idiopathic gout, left ankle and foot: Secondary | ICD-10-CM | POA: Diagnosis not present

## 2020-08-28 DIAGNOSIS — Z7984 Long term (current) use of oral hypoglycemic drugs: Secondary | ICD-10-CM | POA: Diagnosis not present

## 2020-08-28 DIAGNOSIS — Z86718 Personal history of other venous thrombosis and embolism: Secondary | ICD-10-CM | POA: Diagnosis not present

## 2020-08-28 DIAGNOSIS — G9341 Metabolic encephalopathy: Secondary | ICD-10-CM | POA: Diagnosis not present

## 2020-08-28 DIAGNOSIS — I129 Hypertensive chronic kidney disease with stage 1 through stage 4 chronic kidney disease, or unspecified chronic kidney disease: Secondary | ICD-10-CM | POA: Diagnosis present

## 2020-08-28 DIAGNOSIS — R5381 Other malaise: Secondary | ICD-10-CM | POA: Diagnosis not present

## 2020-08-28 DIAGNOSIS — E785 Hyperlipidemia, unspecified: Secondary | ICD-10-CM | POA: Diagnosis present

## 2020-08-28 DIAGNOSIS — Z86010 Personal history of colonic polyps: Secondary | ICD-10-CM

## 2020-08-28 DIAGNOSIS — Z833 Family history of diabetes mellitus: Secondary | ICD-10-CM | POA: Diagnosis not present

## 2020-08-28 DIAGNOSIS — S81001D Unspecified open wound, right knee, subsequent encounter: Secondary | ICD-10-CM | POA: Diagnosis not present

## 2020-08-28 DIAGNOSIS — E119 Type 2 diabetes mellitus without complications: Secondary | ICD-10-CM

## 2020-08-28 DIAGNOSIS — Z86711 Personal history of pulmonary embolism: Secondary | ICD-10-CM

## 2020-08-28 DIAGNOSIS — N138 Other obstructive and reflux uropathy: Secondary | ICD-10-CM | POA: Diagnosis not present

## 2020-08-28 DIAGNOSIS — T83198A Other mechanical complication of other urinary devices and implants, initial encounter: Secondary | ICD-10-CM | POA: Diagnosis not present

## 2020-08-28 DIAGNOSIS — N401 Enlarged prostate with lower urinary tract symptoms: Secondary | ICD-10-CM | POA: Diagnosis not present

## 2020-08-28 DIAGNOSIS — R6251 Failure to thrive (child): Secondary | ICD-10-CM | POA: Diagnosis not present

## 2020-08-28 DIAGNOSIS — N281 Cyst of kidney, acquired: Secondary | ICD-10-CM | POA: Diagnosis not present

## 2020-08-28 DIAGNOSIS — K8020 Calculus of gallbladder without cholecystitis without obstruction: Secondary | ICD-10-CM | POA: Diagnosis not present

## 2020-08-28 DIAGNOSIS — K5900 Constipation, unspecified: Secondary | ICD-10-CM | POA: Diagnosis not present

## 2020-08-28 NOTE — ED Triage Notes (Signed)
Pt here for eval of blood in his foley catheter. Denies complaints.

## 2020-08-29 DIAGNOSIS — N179 Acute kidney failure, unspecified: Principal | ICD-10-CM

## 2020-08-29 DIAGNOSIS — E1122 Type 2 diabetes mellitus with diabetic chronic kidney disease: Secondary | ICD-10-CM | POA: Diagnosis present

## 2020-08-29 DIAGNOSIS — R34 Anuria and oliguria: Secondary | ICD-10-CM | POA: Diagnosis present

## 2020-08-29 DIAGNOSIS — Z978 Presence of other specified devices: Secondary | ICD-10-CM | POA: Diagnosis not present

## 2020-08-29 DIAGNOSIS — I129 Hypertensive chronic kidney disease with stage 1 through stage 4 chronic kidney disease, or unspecified chronic kidney disease: Secondary | ICD-10-CM | POA: Diagnosis present

## 2020-08-29 DIAGNOSIS — Z86010 Personal history of colonic polyps: Secondary | ICD-10-CM | POA: Diagnosis not present

## 2020-08-29 DIAGNOSIS — E785 Hyperlipidemia, unspecified: Secondary | ICD-10-CM | POA: Diagnosis present

## 2020-08-29 DIAGNOSIS — T83028A Displacement of other indwelling urethral catheter, initial encounter: Secondary | ICD-10-CM | POA: Diagnosis present

## 2020-08-29 DIAGNOSIS — T839XXA Unspecified complication of genitourinary prosthetic device, implant and graft, initial encounter: Secondary | ICD-10-CM | POA: Diagnosis not present

## 2020-08-29 DIAGNOSIS — Z7901 Long term (current) use of anticoagulants: Secondary | ICD-10-CM | POA: Diagnosis not present

## 2020-08-29 DIAGNOSIS — Y846 Urinary catheterization as the cause of abnormal reaction of the patient, or of later complication, without mention of misadventure at the time of the procedure: Secondary | ICD-10-CM | POA: Diagnosis present

## 2020-08-29 DIAGNOSIS — N400 Enlarged prostate without lower urinary tract symptoms: Secondary | ICD-10-CM | POA: Diagnosis present

## 2020-08-29 DIAGNOSIS — Y731 Therapeutic (nonsurgical) and rehabilitative gastroenterology and urology devices associated with adverse incidents: Secondary | ICD-10-CM | POA: Diagnosis present

## 2020-08-29 DIAGNOSIS — Z87440 Personal history of urinary (tract) infections: Secondary | ICD-10-CM | POA: Diagnosis not present

## 2020-08-29 DIAGNOSIS — Z86711 Personal history of pulmonary embolism: Secondary | ICD-10-CM | POA: Diagnosis not present

## 2020-08-29 DIAGNOSIS — G9341 Metabolic encephalopathy: Secondary | ICD-10-CM | POA: Diagnosis present

## 2020-08-29 DIAGNOSIS — Z833 Family history of diabetes mellitus: Secondary | ICD-10-CM | POA: Diagnosis not present

## 2020-08-29 DIAGNOSIS — N1330 Unspecified hydronephrosis: Secondary | ICD-10-CM | POA: Diagnosis present

## 2020-08-29 DIAGNOSIS — R319 Hematuria, unspecified: Secondary | ICD-10-CM | POA: Diagnosis present

## 2020-08-29 DIAGNOSIS — I10 Essential (primary) hypertension: Secondary | ICD-10-CM

## 2020-08-29 DIAGNOSIS — Z87891 Personal history of nicotine dependence: Secondary | ICD-10-CM | POA: Diagnosis not present

## 2020-08-29 DIAGNOSIS — Z20822 Contact with and (suspected) exposure to covid-19: Secondary | ICD-10-CM | POA: Diagnosis present

## 2020-08-29 DIAGNOSIS — Z79899 Other long term (current) drug therapy: Secondary | ICD-10-CM | POA: Diagnosis not present

## 2020-08-29 DIAGNOSIS — N1832 Chronic kidney disease, stage 3b: Secondary | ICD-10-CM | POA: Diagnosis present

## 2020-08-29 LAB — URINALYSIS, ROUTINE W REFLEX MICROSCOPIC
Bilirubin Urine: NEGATIVE
Glucose, UA: NEGATIVE mg/dL
Ketones, ur: NEGATIVE mg/dL
Leukocytes,Ua: NEGATIVE
Nitrite: NEGATIVE
Protein, ur: 100 mg/dL — AB
RBC / HPF: >50 RBC/hpf — ABNORMAL HIGH (ref 0–5)
Specific Gravity, Urine: 1.011 (ref 1.005–1.030)
pH: 6 (ref 5.0–8.0)

## 2020-08-29 LAB — CBC
HCT: 37.6 % — ABNORMAL LOW (ref 39.0–52.0)
Hemoglobin: 10.3 g/dL — ABNORMAL LOW (ref 13.0–17.0)
MCH: 26.8 pg (ref 26.0–34.0)
MCHC: 27.4 g/dL — ABNORMAL LOW (ref 30.0–36.0)
MCV: 97.9 fL (ref 80.0–100.0)
Platelets: 301 10*3/uL (ref 150–400)
RBC: 3.84 MIL/uL — ABNORMAL LOW (ref 4.22–5.81)
RDW: 16.8 % — ABNORMAL HIGH (ref 11.5–15.5)
WBC: 10.6 10*3/uL — ABNORMAL HIGH (ref 4.0–10.5)
nRBC: 0 % (ref 0.0–0.2)

## 2020-08-29 LAB — CREATININE, SERUM
Creatinine, Ser: 2.32 mg/dL — ABNORMAL HIGH (ref 0.61–1.24)
GFR, Estimated: 27 mL/min — ABNORMAL LOW (ref >60)

## 2020-08-29 LAB — BASIC METABOLIC PANEL
Anion gap: 17 — ABNORMAL HIGH (ref 5–15)
BUN: 28 mg/dL — ABNORMAL HIGH (ref 8–23)
CO2: 18 mmol/L — ABNORMAL LOW (ref 22–32)
Calcium: 9.1 mg/dL (ref 8.9–10.3)
Chloride: 112 mmol/L — ABNORMAL HIGH (ref 98–111)
Creatinine, Ser: 2.54 mg/dL — ABNORMAL HIGH (ref 0.61–1.24)
GFR, Estimated: 24 mL/min — ABNORMAL LOW (ref >60)
Glucose, Bld: 155 mg/dL — ABNORMAL HIGH (ref 70–99)
Potassium: 4.1 mmol/L (ref 3.5–5.1)
Sodium: 147 mmol/L — ABNORMAL HIGH (ref 135–145)

## 2020-08-29 LAB — RESP PANEL BY RT-PCR (FLU A&B, COVID) ARPGX2
Influenza A by PCR: NEGATIVE
Influenza B by PCR: NEGATIVE
SARS Coronavirus 2 by RT PCR: NEGATIVE

## 2020-08-29 LAB — GLUCOSE, CAPILLARY
Glucose-Capillary: 83 mg/dL (ref 70–99)
Glucose-Capillary: 130 mg/dL — ABNORMAL HIGH (ref 70–99)

## 2020-08-29 MED ORDER — CEFTRIAXONE SODIUM 1 G IJ SOLR
1.0000 g | INTRAMUSCULAR | Status: DC
Start: 2020-08-29 — End: 2020-08-29

## 2020-08-29 MED ORDER — AMLODIPINE BESYLATE 10 MG PO TABS
10.0000 mg | ORAL_TABLET | Freq: Every day | ORAL | Status: DC
  Administered 2020-08-29 – 2020-08-30 (×2): 10 mg via ORAL
  Filled 2020-08-29 (×2): qty 1

## 2020-08-29 MED ORDER — LACTATED RINGERS IV SOLN: INTRAVENOUS | Status: DC

## 2020-08-29 MED ORDER — HYDRALAZINE HCL 20 MG/ML IJ SOLN: 10.0000 mg | Freq: Four times a day (QID) | INTRAMUSCULAR | Status: DC | PRN

## 2020-08-29 MED ORDER — TRAMADOL HCL 50 MG PO TABS: 50.0000 mg | ORAL_TABLET | Freq: Three times a day (TID) | ORAL | Status: DC | PRN

## 2020-08-29 MED ORDER — POLYETHYLENE GLYCOL 3350 17 G PO PACK: 17.0000 g | PACK | Freq: Every day | ORAL | Status: DC | PRN

## 2020-08-29 MED ORDER — FINASTERIDE 5 MG PO TABS
5.0000 mg | ORAL_TABLET | Freq: Every day | ORAL | Status: DC
  Administered 2020-08-29 – 2020-08-30 (×2): 5 mg via ORAL
  Filled 2020-08-29 (×2): qty 1

## 2020-08-29 MED ORDER — LACTATED RINGERS IV BOLUS
2000.0000 mL | Freq: Once | INTRAVENOUS | Status: AC
  Administered 2020-08-29: 2000 mL via INTRAVENOUS

## 2020-08-29 MED ORDER — CHLORHEXIDINE GLUCONATE CLOTH 2 % EX PADS
6.0000 | MEDICATED_PAD | Freq: Every day | CUTANEOUS | Status: DC
  Administered 2020-08-29 – 2020-08-30 (×2): 6 via TOPICAL

## 2020-08-29 MED ORDER — HYDRALAZINE HCL 25 MG PO TABS
25.0000 mg | ORAL_TABLET | Freq: Three times a day (TID) | ORAL | Status: DC
  Administered 2020-08-29 – 2020-08-30 (×3): 25 mg via ORAL
  Filled 2020-08-29 (×3): qty 1

## 2020-08-29 MED ORDER — TAMSULOSIN HCL 0.4 MG PO CAPS
0.4000 mg | ORAL_CAPSULE | Freq: Every day | ORAL | Status: DC
  Administered 2020-08-29 – 2020-08-30 (×2): 0.4 mg via ORAL
  Filled 2020-08-29 (×2): qty 1

## 2020-08-29 NOTE — ED Provider Notes (Signed)
Patient seen/examined in the Emergency Department in conjunction with Advanced Practice Provider McDonald Patient reports decreased urine output in foley catheter bag Exam : awake/alert, no distress, reports feeling improved after foley change Plan: awaiting labs but patient otherwise well appearing     Ripley Fraise, MD 08/29/20 [phone]

## 2020-08-29 NOTE — H&P (Signed)
History and Physical        Hospital Admission Note Date: 08/29/2020  Patient name: Sergio [name] Medical record number: [id number] Date of birth: 11-26-1932 Age: 86 y.o. Gender: male  PCP: Forrest Moron, MD    Patient coming from:  Home  I have reviewed all records in the [place].    Chief Complaint:  Hematuria, Foley bag empty for almost 2 days  HPI: Patient is a 86 year old male with history of diabetes mellitus, hypertension, PE on Eliquis status post chronic Foley catheter secondary to hydronephrosis presented to ED with hematuria and anuria for almost 2 days.  He was recently discharged (11/19-11/22), treated for sepsis secondary to UTI and acute kidney injury.  Patient is poor historian. History was obtained from the patient's son on the phone. Patient was brought to ED by his son who reported that patient was having blood in the Foley catheter and he noticed that the Foley bag had been empty for almost 2 days.  He had emptied just a few 100 cc of urine on 11/30 morning. Otherwise patient had no fevers, did not complain of any abdominal pain or flank pain.  He had no nausea vomiting or diarrhea.  Foley catheter had been placed by [facility] for chronic BPH and bilateral hydronephrosis.  He has an appointment on 12/10 with urology.  Per son, patient has generalized weakness and mostly bedbound at baseline, lives with him. Bladder scan showed 645cc urine.  Foley catheter was almost completely dislodged and was replaced in ED.  ED work-up/course:  Temp 97.8, respiratory rate 15, BP 147/62, heart rate 76, O2 sats 99% on room air Sodium 147, potassium 4.1, CO2 18, BUN 28, creatinine 2.5, improving to 2.3 UA negative for UTI  Review of Systems: Positives marked in 'bold' Difficult obtain review of system from the patient, poor historian, unclear if he has  dementia  Past Medical History: Past Medical History:  Diagnosis Date  . Colon polyps 06/22/2014   4 colon polyps by colonoscopy.  Repeat 3 years.  Hung.  . Diabetes mellitus   . Hypertension   . Other and unspecified hyperlipidemia   . PE (pulmonary embolism)     Past Surgical History:  Procedure Laterality Date  . COLONOSCOPY W/ POLYPECTOMY  06/22/2014   4 colon polyps; repeat 3 years; [place].    Medications: Prior to Admission medications   Medication Sig Start Date End Date Taking? Authorizing Provider  acetaminophen (TYLENOL) 500 MG tablet Take 500 mg by mouth every 6 (six) hours as needed for mild pain.    Yes [provider]  amLODipine (NORVASC) 10 MG tablet Take 1 tablet (10 mg total) by mouth daily. 07/06/20 09/04/20 Yes Antonieta Pert, MD  apixaban (ELIQUIS) 5 MG TABS tablet Take 1 tablet (5 mg total) by mouth 2 (two) times daily. 08/19/20  Yes Dwyane Dee, MD  finasteride (PROSCAR) 5 MG tablet Take 1 tablet (5 mg total) by mouth daily. 07/02/20 09/30/20 Yes Alexis Frock, MD  hydrALAZINE (APRESOLINE) 25 MG tablet Take 1 tablet (25 mg total) by mouth 3 (three) times daily. 07/06/20 08/29/20 Yes Antonieta Pert, MD  ondansetron (ZOFRAN) 4 MG tablet  Take 1 tablet (4 mg total) by mouth every 8 (eight) hours as needed for nausea or vomiting. 01/21/20  Yes Hayden Rasmussen, MD  tamsulosin (FLOMAX) 0.4 MG CAPS capsule Take 1 capsule (0.4 mg total) by mouth daily. 07/02/20  Yes Alexis Frock, MD  traMADol (ULTRAM) 50 MG tablet Take 50 mg by mouth every 8 (eight) hours as needed for moderate pain.  08/05/20  Yes [provider]    Allergies:  No Known Allergies  Social History:  reports that he quit smoking about 44 years ago. His smoking use included cigars. He quit after 17.00 years of use. He has never used smokeless tobacco. He reports that he does not drink alcohol and does not use drugs.  Family History: Family History  Problem Relation Age of Onset  . Diabetes  Other     Physical Exam: Blood pressure (!) 147/62, pulse 75, temperature 97.8 F (36.6 C), temperature source Oral, resp. rate 15, SpO2 99 %. General: Alert, awake, oriented x2 (self and place), NAD Eyes: pink conjunctiva,anicteric sclera, pupils equal and reactive to light and accomodation, HEENT: normocephalic, atraumatic, oropharynx clear Neck: supple, no masses or lymphadenopathy, no goiter, no bruits, no JVD CVS: Regular rate and rhythm, without murmurs, rubs or gallops. No lower extremity edema Resp : Clear to auscultation bilaterally, no wheezing, rales or rhonchi. GI : Soft, nontender, nondistended, positive bowel sounds, no masses. No hepatomegaly. No hernia.  Musculoskeletal: No clubbing or cyanosis, positive pedal pulses. No contracture. ROM intact  Neuro: Grossly intact, no focal neurological deficits, strength 5/5 upper and lower extremities bilaterally Psych: oriented to self and place, able to follow commands and answer simple questions Skin: no rashes or lesions, warm and dry GU: No frank hematuria, Foley bag full with dark brownish-red urine   LABS on Admission: I have personally reviewed all the labs and imagings below    Basic Metabolic Panel: Recent Labs  Lab 08/29/20 0213 08/29/20 0555  NA 147*  --   K 4.1  --   CL 112*  --   CO2 18*  --   GLUCOSE 155*  --   BUN 28*  --   CREATININE 2.54* 2.32*  CALCIUM 9.1  --    Liver Function Tests: No results for input(s): AST, ALT, ALKPHOS, BILITOT, PROT, ALBUMIN in the last 168 hours. No results for input(s): LIPASE, AMYLASE in the last 168 hours. No results for input(s): AMMONIA in the last 168 hours. CBC: Recent Labs  Lab 08/29/20 0213  WBC 10.6*  HGB 10.3*  HCT 37.6*  MCV 97.9  PLT 301   Cardiac Enzymes: No results for input(s): CKTOTAL, CKMB, CKMBINDEX, TROPONINI in the last 168 hours. BNP: Invalid input(s): POCBNP CBG: No results for input(s): GLUCAP in the last 168 hours.  Radiological Exams  on Admission:  No results found.    EKG: Independently reviewed.  None available   Assessment/Plan Principal Problem:   AKI (acute kidney injury) ([place]) superimposed on CKD stage IIIb -Patient presented with creatinine of 2.5, baseline creatinine of 1.6-1.7, likely due to obstructive uropathy, dislodged chronic Foley catheter, known bilateral hydronephrosis, BPH -Foley catheter replaced, continue IV fluid hydration -Patient has appointment with urology on 09/06/2020, no acute urological needs -Monitor closely for any worsening of the hematuria, currently improving.  No UTI  Active Problems: Hematuria -Likely due to dislodged Foley catheter, ?  Traumatic, unclear if the patient had tried to pull the Foley catheter (son is not sure) -Currently improving, hemoglobin stable, was 9.4  on 11/22, currently 10.3 - will hold off on eliquis today, resume in a.m. if H&H stable and urine has cleared  Chronic indwelling Foley catheter in the setting of known bilateral hydronephrosis and BPH -Foley catheter replaced -Continue finasteride, tamsulosin  Essential hypertension -BP somewhat elevated, resume amlodipine, hydralazine  History of PE -Holding Eliquis today, until hematuria completely cleared, resume in a.m. if H&H stable  Acute metabolic encephalopathy -Possibly due to #1 however patient may have cognitive deficits are underlying dementia.  CT head on 11/19 had shown generalized cerebral atrophy.  Currently following commands and no focal neurological deficits.    DVT prophylaxis: SCDs, holding Eliquis today  CODE STATUS: Discussed in detail with the patient's son, POA, requested DNR DNI status  Consults called: None  Family Communication: Admission, patients condition and plan of care including tests being ordered have been discussed with the patient's son who indicates understanding and agree with the plan and Code Status  Admission status:   The medical decision making on  this patient was of high complexity and the patient is at high risk for clinical deterioration, therefore this is a level 3 admission.  Severity of Illness:      The appropriate patient status for this patient is INPATIENT. Inpatient status is judged to be reasonable and necessary in order to provide the required intensity of service to ensure the patient's safety. The patient's presenting symptoms, physical exam findings, and initial radiographic and laboratory data in the context of their chronic comorbidities is felt to place them at high risk for further clinical deterioration. Furthermore, it is not anticipated that the patient will be medically stable for discharge from the hospital within 2 midnights of admission. The following factors support the patient status of inpatient.   " The patient's presenting symptoms include hematuria, anuria for 2 days, Foley catheter dislodged " The worrisome physical exam findings include confusion " The initial radiographic and laboratory data are worrisome because of acute kidney injury, " The chronic co-morbidities include chronic kidney disease stage III, indwelling Foley catheter, BPH, hydronephrosis   * I certify that at the point of admission it is my clinical judgment that the patient will require inpatient hospital care spanning beyond 2 midnights from the point of admission due to high intensity of service, high risk for further deterioration and high frequency of surveillance required.*    Time Spent on Admission: 70 mins      Marvelle Caudill M.D. [facility] 08/29/2020, 10:52 AM

## 2020-08-29 NOTE — Plan of Care (Signed)
  Problem: Pain Managment: Goal: General experience of comfort will improve Outcome: Progressing   Problem: Safety: Goal: Ability to remain free from injury will improve Outcome: Progressing   

## 2020-08-29 NOTE — ED Provider Notes (Signed)
[facility] EMERGENCY DEPARTMENT Provider Note   CSN: [id number] Arrival date & time: 08/28/20  1625     History Chief Complaint  Patient presents with  . Hematuria    Sergio [name] is a 86 y.o. male with a history of diabetes mellitus type 2, PE on Eliquis, HTN who is accompanied to the emergency department by his son with a chief complaint of hematuria.  The patient's son reports that they were advised to come to the ER by his doctor for blood in the patient's Foley catheter.  The patient is unsure of how long he has been having hematuria.  The patient's son also reports that his Foley bag has been empty for almost 48 hours. He last emptied a few hundred cc's of urine on 11/30 at 8:00 AM.   The patient denies fever, chills, abdominal pain, flank pain, back pain, nausea, vomiting, diarrhea, constipation, dizziness, lightheadedness.  He has no complaints at this time.  He is unsure of why he has a Foley catheter placed.  Per chart review, patient was discharged on 11/22 after admission for severe sepsis due to UTI with acute renal failure and lactic acidosis.  He had a Foley catheter placed due to severe hydronephrosis.  Baseline creatinine is 1.5.  The patient is on Eliquis.   The history is provided by the patient and medical records. No language interpreter was used.       Past Medical History:  Diagnosis Date  . Colon polyps 06/22/2014   4 colon polyps by colonoscopy.  Repeat 3 years.  [place].  . Diabetes mellitus   . Hypertension   . Other and unspecified hyperlipidemia   . PE (pulmonary embolism)     Patient Active Problem List   Diagnosis Date Noted  . Chronic indwelling Foley catheter 08/19/2020  . History of pulmonary embolus (PE) 08/19/2020  . Severe sepsis ([place]) 08/17/2020  . Sepsis due to urinary tract infection ([place]) 08/16/2020  . AKI (acute kidney injury) ([place]) 06/30/2020  . Hydronephrosis 06/30/2020  . Pain due to onychomycosis of  toenails of both feet 10/13/2019  . Urinary frequency 10/01/2017  . Type 2 diabetes mellitus without complication, without long-term current use of insulin ([place]) 10/01/2017  . Loss of weight 03/24/2017  . Hypoglycemia 03/24/2017  . Iatrogenic hypotension 03/24/2017  . Nonspecific abnormal electrocardiogram (ECG) (EKG) 02/02/2017  . Colon polyps 07/04/2014  . Controlled diabetes mellitus without long-term current use of insulin ([place]) 12/18/2011  . Uncontrolled hypertension   . Other and unspecified hyperlipidemia     Past Surgical History:  Procedure Laterality Date  . COLONOSCOPY W/ POLYPECTOMY  06/22/2014   4 colon polyps; repeat 3 years; [place].       Family History  Problem Relation Age of Onset  . Diabetes Other     Social History   Tobacco Use  . Smoking status: Former Smoker    Years: 17.00    Types: Cigars    Quit date: 09/29/1975    Years since quitting: 44.9  . Smokeless tobacco: Never Used  Substance Use Topics  . Alcohol use: No  . Drug use: No    Home Medications Prior to Admission medications   Medication Sig Start Date End Date Taking? Authorizing Provider  acetaminophen (TYLENOL) 500 MG tablet Take 500 mg by mouth every 6 (six) hours as needed (pain).    [provider]  amLODipine (NORVASC) 10 MG tablet Take 1 tablet (10 mg total) by mouth daily. 07/06/20 09/04/20  Antonieta Pert, MD  apixaban (ELIQUIS) 5 MG TABS tablet Take 1 tablet (5 mg total) by mouth 2 (two) times daily. 08/19/20   Dwyane Dee, MD  finasteride (PROSCAR) 5 MG tablet Take 1 tablet (5 mg total) by mouth daily. 07/02/20 09/30/20  Alexis Frock, MD  hydrALAZINE (APRESOLINE) 25 MG tablet Take 1 tablet (25 mg total) by mouth 3 (three) times daily. 07/06/20 08/27/20  Antonieta Pert, MD  ondansetron (ZOFRAN) 4 MG tablet Take 1 tablet (4 mg total) by mouth every 8 (eight) hours as needed for nausea or vomiting. 01/21/20   Hayden Rasmussen, MD  tamsulosin (FLOMAX) 0.4 MG CAPS capsule Take 1  capsule (0.4 mg total) by mouth daily. 07/02/20   Alexis Frock, MD  traMADol (ULTRAM) 50 MG tablet Take 50 mg by mouth every 8 (eight) hours as needed (pain).  08/05/20   [provider]    Allergies    Patient has no known allergies.  Review of Systems   Review of Systems  Constitutional: Negative for appetite change, diaphoresis and fever.  HENT: Negative for congestion and sore throat.   Eyes: Negative for visual disturbance.  Respiratory: Negative for cough, shortness of breath and wheezing.   Cardiovascular: Negative for chest pain and palpitations.  Gastrointestinal: Positive for abdominal pain. Negative for blood in stool, diarrhea, nausea and vomiting.  Genitourinary: Positive for decreased urine volume and hematuria. Negative for discharge, dysuria, flank pain, frequency and penile pain.  Musculoskeletal: Negative for back pain, myalgias and neck pain.  Skin: Negative for rash.  Allergic/Immunologic: Negative for immunocompromised state.  Neurological: Negative for seizures, syncope, weakness, numbness and headaches.  Psychiatric/Behavioral: Negative for confusion.    Physical Exam Updated Vital Signs BP (!) 176/78   Pulse 85   Temp 97.8 F (36.6 C) (Oral)   Resp 20   SpO2 100%   Physical Exam Vitals and nursing note reviewed.  Constitutional:      General: He is not in acute distress.    Appearance: He is well-developed. He is not toxic-appearing or diaphoretic.     Comments: Pleasant, elderly male.  No acute distress.  HENT:     Head: Normocephalic.     Mouth/Throat:     Mouth: Mucous membranes are moist.     Pharynx: No oropharyngeal exudate or posterior oropharyngeal erythema.  Eyes:     Conjunctiva/sclera: Conjunctivae normal.  Cardiovascular:     Rate and Rhythm: Normal rate and regular rhythm.     Pulses: Normal pulses.     Heart sounds: Normal heart sounds. No murmur heard.  No friction rub. No gallop.   Pulmonary:     Effort: Pulmonary  effort is normal. No respiratory distress.     Breath sounds: No stridor. No wheezing, rhonchi or rales.  Chest:     Chest wall: No tenderness.  Abdominal:     General: There is no distension.     Palpations: Abdomen is soft. There is no mass.     Tenderness: There is abdominal tenderness. There is no right CVA tenderness, left CVA tenderness, guarding or rebound.     Hernia: No hernia is present.     Comments: Bladder is palpable and distended.  He is significantly tender to palpation in the suprapubic region.  Normoactive bowel sounds.  No peritoneal signs.  Genitourinary:    Comments: Foley catheter out of place with catheter extending almost to the patient's knee.  Back at bedside is completely empty.  There is a moderate amount  of clotted blood noted in the tubing. Musculoskeletal:     Cervical back: Neck supple.     Right lower leg: No edema.     Left lower leg: No edema.  Skin:    General: Skin is warm and dry.  Neurological:     Mental Status: He is alert.  Psychiatric:        Behavior: Behavior normal.     ED Results / Procedures / Treatments   Labs (all labs ordered are listed, but only abnormal results are displayed) Labs Reviewed  URINALYSIS, ROUTINE W REFLEX MICROSCOPIC - Abnormal; Notable for the following components:      Result Value   APPearance HAZY (*)    Hgb urine dipstick LARGE (*)    Protein, ur 100 (*)    RBC / HPF >50 (*)    Bacteria, UA RARE (*)    All other components within normal limits  CBC - Abnormal; Notable for the following components:   WBC 10.6 (*)    RBC 3.84 (*)    Hemoglobin 10.3 (*)    HCT 37.6 (*)    MCHC 27.4 (*)    RDW 16.8 (*)    All other components within normal limits  BASIC METABOLIC PANEL - Abnormal; Notable for the following components:   Sodium 147 (*)    Chloride 112 (*)    CO2 18 (*)    Glucose, Bld 155 (*)    BUN 28 (*)    Creatinine, Ser 2.54 (*)    GFR, Estimated 24 (*)    Anion gap 17 (*)    All other  components within normal limits  CREATININE, SERUM - Abnormal; Notable for the following components:   Creatinine, Ser 2.32 (*)    GFR, Estimated 27 (*)    All other components within normal limits  URINE CULTURE  RESP PANEL BY RT-PCR (FLU A&B, COVID) ARPGX2    EKG None  Radiology No results found.  Procedures Procedures (including critical care time)  Medications Ordered in ED Medications  lactated ringers infusion (has no administration in time range)  lactated ringers bolus 2,000 mL (2,000 mLs Intravenous New Bag/Given 08/29/20 3734)    ED Course  I have reviewed the triage vital signs and the nursing notes.  Pertinent labs & imaging results that were available during my care of the patient were reviewed by me and considered in my medical decision making (see chart for details).    MDM Rules/Calculators/A&P                          86 year old male with a history of diabetes mellitus type 2, PE on Eliquis, HTN who was recently discharged after severe sepsis thought to be from a UTI with associated lactic acidosis and acute renal failure who was discharged home with a Foley catheter due to severe hydronephrosis.  He was advised to return to the ER due to hematuria as the patient is on Eliquis from a previous PE.  His son reports that he has had no urine output for almost 48 hours.  The patient was seen and independently evaluated by Dr. Christy Gentles, attending physician.  Given that catheter is almost completely dislodged, will put replace Foley catheter in the ER.  Bladder scan at bedside with greater than 645 mL of urine.  I suspect this is falsely low given that patient has had almost no urine output in almost 48 hours.  After Foley catheter was  replaced, dark brownish-red urine was noted in the Foley bag.   Labs have been reviewed and independently interpreted by me.  Given concern for urinary retention from last 48 hours, metabolic panel was obtained.  Creatinine is 2.54,  up from 1.68 at discharge on 11/22.  We will give the patient a 2 L LR bolus and recheck creatinine level.  If improving, he can likely be discharged home now that Foley catheter has been replaced and is draining.  Since the patient is on Eliquis and he has had hematuria there is concern for continued bleeding.  Hemoglobin was 10.3, stable from previous.  Repeat CR is minimally changed at 2.3.  Given minimal change, he will need to be readmitted to the hospital for AKI.  We will start the patient on maintenance fluids at 150/hr.  Consult to the hospitalist service and Dr. [place] Crane has accepted the patient for admission.  The patient appears reasonably stabilized for admission considering the current resources, flow, and capabilities available in the ED at this time, and I doubt any other [facility] requiring further screening and/or treatment in the ED prior to admission.  Final Clinical Impression(s) / ED Diagnoses Final diagnoses:  Foley catheter problem, initial encounter ([place])  AKI (acute kidney injury) [facility])    Rx / DC Orders ED Discharge Orders    None       Joanne Gavel, PA-C 08/29/20 3582    Ripley Fraise, MD 08/29/20 [phone]

## 2020-08-29 NOTE — ED Notes (Signed)
Pt bladder scanned with more than 622ml.

## 2020-08-29 NOTE — Plan of Care (Signed)
  Problem: Education: Goal: Knowledge of General Education information will improve Description: Including pain rating scale, medication(s)/side effects and non-pharmacologic comfort measures Outcome: Progressing   Problem: Health Behavior/Discharge Planning: Goal: Ability to manage health-related needs will improve Outcome: Progressing   Problem: Clinical Measurements: Goal: Will remain free from infection Outcome: Progressing   Problem: Activity: Goal: Risk for activity intolerance will decrease Outcome: Progressing   Problem: Nutrition: Goal: Adequate nutrition will be maintained Outcome: Progressing   Problem: Skin Integrity: Goal: Risk for impaired skin integrity will decrease Outcome: Progressing   

## 2020-08-30 LAB — BASIC METABOLIC PANEL
Anion gap: 9 (ref 5–15)
BUN: 16 mg/dL (ref 8–23)
CO2: 26 mmol/L (ref 22–32)
Calcium: 8.4 mg/dL — ABNORMAL LOW (ref 8.9–10.3)
Chloride: 110 mmol/L (ref 98–111)
Creatinine, Ser: 1.4 mg/dL — ABNORMAL HIGH (ref 0.61–1.24)
GFR, Estimated: 49 mL/min — ABNORMAL LOW (ref >60)
Glucose, Bld: 96 mg/dL (ref 70–99)
Potassium: 3.4 mmol/L — ABNORMAL LOW (ref 3.5–5.1)
Sodium: 145 mmol/L (ref 135–145)

## 2020-08-30 LAB — CBC
HCT: 25.1 % — ABNORMAL LOW (ref 39.0–52.0)
Hemoglobin: 8.1 g/dL — ABNORMAL LOW (ref 13.0–17.0)
MCH: 27.6 pg (ref 26.0–34.0)
MCHC: 32.3 g/dL (ref 30.0–36.0)
MCV: 85.7 fL (ref 80.0–100.0)
Platelets: 384 10*3/uL (ref 150–400)
RBC: 2.93 MIL/uL — ABNORMAL LOW (ref 4.22–5.81)
RDW: 16.1 % — ABNORMAL HIGH (ref 11.5–15.5)
WBC: 8.6 10*3/uL (ref 4.0–10.5)
nRBC: 0 % (ref 0.0–0.2)

## 2020-08-30 LAB — URINE CULTURE: Culture: NO GROWTH

## 2020-08-30 LAB — GLUCOSE, CAPILLARY: Glucose-Capillary: 75 mg/dL (ref 70–99)

## 2020-08-30 NOTE — Progress Notes (Signed)
Discharge instructions reviewed with patient and patients son. Son states MD called him this morning and reviewed discharge instructions with him as well. All belongings collected, and patient discharged with son in private vehicle

## 2020-08-30 NOTE — Discharge Summary (Signed)
Physician Discharge Summary   Sergio [name] FVC:[id number] DOB: 1933/07/04 DOA: 08/28/2020  PCP: Sergio Moron, MD  Admit date: 08/28/2020 Discharge date: 08/30/2020  Admitted From: home Disposition:  home Admitting physician: Dr. Tana [name] Discharging physician: Sergio Dee, MD  Recommendations for Outpatient Follow-up:  1. Continue outpt care with urology 2. Patient's son and patient instructed to be cautious with Foley care at discharge especially with pulling or getting tubing caught   Patient discharged to home in Discharge Condition: stable CODE STATUS: Full Diet recommendation:  Diet Orders (From admission, onward)    Start     Ordered   08/30/20 0000  Diet - low sodium heart healthy        08/30/20 1034   08/29/20 1611  Diet Heart Room service appropriate? Yes; Fluid consistency: Thin  Diet effective now       Question Answer Comment  Room service appropriate? Yes   Fluid consistency: Thin      08/29/20 1610          Hospital Course:  Sergio [name] is an 86 year old male with PMH type 2 diabetes, hypertension, PE on Eliquis, BPH and hydronephrosis with chronic indwelling Foley who presented to the ER with anuria and some hematuria for approximately 2 days.  He had recently been discharged on 08/19/2020 after being treated for sepsis due to UTI and AKI. Catheter was placed due to his underlying chronic BPH and bilateral hydronephrosis.  He has an upcoming appointment with urology on 09/06/2020 as well.  Patient underwent bladder scanning on admission which showed approximately 645 cc and the Foley catheter was almost dislodged.  The catheter was removed and replaced in the ER and began draining urine immediately. His renal function was worsened on initial work-up which showed improvement at time of discharge after obstruction was relieved. Patient and his son were counseled on attempting to be more cautious with tubing at time of discharge.  Son did state that patient  occasionally does get confused and possibly may be pulling at the tube, but he will continue to try and monitor his dad closer.  He understands if tubing stops draining again in the future, that placement will need to be checked once again.   The patient's chronic medical conditions were treated accordingly per the patient's home medication regimen except as noted.  On day of discharge, patient was felt deemed stable for discharge. Patient/family member advised to call PCP or come back to ER if needed.   Principal Diagnosis: AKI (acute kidney injury) [facility])  Discharge Diagnoses: Active Hospital Problems   Diagnosis Date Noted  . AKI (acute kidney injury) ([place]) 06/30/2020  . Essential hypertension 08/29/2020  . Chronic indwelling Foley catheter 08/19/2020  . Hydronephrosis 06/30/2020  . Controlled diabetes mellitus without long-term current use of insulin ([place]) 12/18/2011    Resolved Hospital Problems  No resolved problems to display.    Discharge Instructions    Diet - low sodium heart healthy   Complete by: As directed    Increase activity slowly   Complete by: As directed      Allergies as of 08/30/2020   No Known Allergies     Medication List    TAKE these medications   acetaminophen 500 MG tablet Commonly known as: TYLENOL Take 500 mg by mouth every 6 (six) hours as needed for mild pain.   amLODipine 10 MG tablet Commonly known as: NORVASC Take 1 tablet (10 mg total) by mouth daily.   apixaban 5 MG Tabs  tablet Commonly known as: ELIQUIS Take 1 tablet (5 mg total) by mouth 2 (two) times daily.   finasteride 5 MG tablet Commonly known as: Proscar Take 1 tablet (5 mg total) by mouth daily.   hydrALAZINE 25 MG tablet Commonly known as: APRESOLINE Take 1 tablet (25 mg total) by mouth 3 (three) times daily.   ondansetron 4 MG tablet Commonly known as: ZOFRAN Take 1 tablet (4 mg total) by mouth every 8 (eight) hours as needed for nausea or vomiting.   tamsulosin  0.4 MG Caps capsule Commonly known as: FLOMAX Take 1 capsule (0.4 mg total) by mouth daily.   traMADol 50 MG tablet Commonly known as: ULTRAM Take 50 mg by mouth every 8 (eight) hours as needed for moderate pain.       No Known Allergies  Consultations: None  Discharge Exam: BP (!) 121/56 (BP Location: Right Arm)   Pulse 72   Temp 98.2 F (36.8 C)   Resp 17   SpO2 99%  General appearance: alert, cooperative and no distress Head: Normocephalic, without obvious abnormality, atraumatic Eyes: EOMI Lungs: clear to auscultation bilaterally Heart: regular rate and rhythm and S1, S2 normal Abdomen: normal findings: bowel sounds normal and soft, non-tender Male genitalia: Foley in place Extremities: No edema Skin: mobility and turgor normal Neurologic: Grossly normal  The results of significant diagnostics from this hospitalization (including imaging, microbiology, ancillary and laboratory) are listed below for reference.   Microbiology: Recent Results (from the past 240 hour(s))  Urine culture     Status: None   Collection Time: 08/29/20  2:16 AM   Specimen: Urine, Random  Result Value Ref Range Status   Specimen Description URINE, RANDOM  Final   Special Requests NONE  Final   Culture   Final    NO GROWTH Performed at [facility], [address], [place], [place] [address]    Report Status 08/30/2020 FINAL  Final  Resp Panel by RT-PCR (Flu A&B, Covid) Nasopharyngeal Swab     Status: None   Collection Time: 08/29/20  6:30 AM   Specimen: Nasopharyngeal Swab; Nasopharyngeal(NP) swabs in vial transport medium  Result Value Ref Range Status   SARS Coronavirus 2 by RT PCR NEGATIVE NEGATIVE Final    Comment: (NOTE) SARS-CoV-2 target nucleic acids are NOT DETECTED.  The SARS-CoV-2 RNA is generally detectable in upper respiratory specimens during the acute phase of infection. The lowest concentration of SARS-CoV-2 viral copies this assay can detect is 138 copies/mL.  A negative result does not preclude SARS-Cov-2 infection and should not be used as the sole basis for treatment or other patient management decisions. A negative result may occur with  improper specimen collection/handling, submission of specimen other than nasopharyngeal swab, presence of viral mutation(s) within the areas targeted by this assay, and inadequate number of viral copies(<138 copies/mL). A negative result must be combined with clinical observations, patient history, and epidemiological information. The expected result is Negative.  Fact Sheet for Patients:  EntrepreneurPulse.com.au  Fact Sheet for Healthcare Providers:  IncredibleEmployment.be  This test is no t yet approved or cleared by the [place] FDA and  has been authorized for detection and/or diagnosis of SARS-CoV-2 by FDA under an Emergency Use Authorization (EUA). This EUA will remain  in effect (meaning this test can be used) for the duration of the COVID-19 declaration under Section 564(b)(1) of the Act, 21 U.S.C.section 360bbb-3(b)(1), unless the authorization is terminated  or revoked sooner.  Influenza A by PCR NEGATIVE NEGATIVE Final   Influenza B by PCR NEGATIVE NEGATIVE Final    Comment: (NOTE) The Xpert Xpress SARS-CoV-2/FLU/RSV plus assay is intended as an aid in the diagnosis of influenza from Nasopharyngeal swab specimens and should not be used as a sole basis for treatment. Nasal washings and aspirates are unacceptable for Xpert Xpress SARS-CoV-2/FLU/RSV testing.  Fact Sheet for Patients: EntrepreneurPulse.com.au  Fact Sheet for Healthcare Providers: IncredibleEmployment.be  This test is not yet approved or cleared by the [place] FDA and has been authorized for detection and/or diagnosis of SARS-CoV-2 by FDA under an Emergency Use Authorization (EUA). This EUA will remain in effect (meaning this test  can be used) for the duration of the COVID-19 declaration under Section 564(b)(1) of the Act, 21 U.S.C. section 360bbb-3(b)(1), unless the authorization is terminated or revoked.  Performed at [facility], [place] [address] [place], [place] [address]      Labs: BNP (last 3 results) No results for input(s): BNP in the last 8760 hours. Basic Metabolic Panel: Recent Labs  Lab 08/29/20 0213 08/29/20 0555 08/30/20 0320  NA 147*  --  145  K 4.1  --  3.4*  CL 112*  --  110  CO2 18*  --  26  GLUCOSE 155*  --  96  BUN 28*  --  16  CREATININE 2.54* 2.32* 1.40*  CALCIUM 9.1  --  8.4*   Liver Function Tests: No results for input(s): AST, ALT, ALKPHOS, BILITOT, PROT, ALBUMIN in the last 168 hours. No results for input(s): LIPASE, AMYLASE in the last 168 hours. No results for input(s): AMMONIA in the last 168 hours. CBC: Recent Labs  Lab 08/29/20 0213 08/30/20 0853  WBC 10.6* 8.6  HGB 10.3* 8.1*  HCT 37.6* 25.1*  MCV 97.9 85.7  PLT 301 384   Cardiac Enzymes: No results for input(s): CKTOTAL, CKMB, CKMBINDEX, TROPONINI in the last 168 hours. BNP: Invalid input(s): POCBNP CBG: Recent Labs  Lab 08/29/20 1655 08/29/20 2135 08/30/20 0645  GLUCAP 83 130* 75   D-Dimer No results for input(s): DDIMER in the last 72 hours. Hgb A1c No results for input(s): HGBA1C in the last 72 hours. Lipid Profile No results for input(s): CHOL, HDL, LDLCALC, TRIG, CHOLHDL, LDLDIRECT in the last 72 hours. Thyroid function studies No results for input(s): TSH, T4TOTAL, T3FREE, THYROIDAB in the last 72 hours.  Invalid input(s): FREET3 Anemia work up No results for input(s): VITAMINB12, FOLATE, FERRITIN, TIBC, IRON, RETICCTPCT in the last 72 hours. Urinalysis    Component Value Date/Time   COLORURINE YELLOW 08/29/2020 0217   APPEARANCEUR HAZY (A) 08/29/2020 0217   LABSPEC 1.011 08/29/2020 0217   PHURINE 6.0 08/29/2020 0217   GLUCOSEU NEGATIVE 08/29/2020 0217   HGBUR LARGE (A)  08/29/2020 0217   BILIRUBINUR NEGATIVE 08/29/2020 0217   BILIRUBINUR negative 10/01/2017 0901   BILIRUBINUR neg 04/02/2014 1605   KETONESUR NEGATIVE 08/29/2020 0217   PROTEINUR 100 (A) 08/29/2020 0217   UROBILINOGEN 0.2 10/01/2017 0901   UROBILINOGEN 0.2 02/12/2015 2205   NITRITE NEGATIVE 08/29/2020 0217   LEUKOCYTESUR NEGATIVE 08/29/2020 0217   Sepsis Labs Invalid input(s): PROCALCITONIN,  WBC,  LACTICIDVEN Microbiology Recent Results (from the past 240 hour(s))  Urine culture     Status: None   Collection Time: 08/29/20  2:16 AM   Specimen: Urine, Random  Result Value Ref Range Status   Specimen Description URINE, RANDOM  Final   Special Requests NONE  Final   Culture  Final    NO GROWTH Performed at [facility], [place] [address], [place], [place] [address]    Report Status 08/30/2020 FINAL  Final  Resp Panel by RT-PCR (Flu A&B, Covid) Nasopharyngeal Swab     Status: None   Collection Time: 08/29/20  6:30 AM   Specimen: Nasopharyngeal Swab; Nasopharyngeal(NP) swabs in vial transport medium  Result Value Ref Range Status   SARS Coronavirus 2 by RT PCR NEGATIVE NEGATIVE Final    Comment: (NOTE) SARS-CoV-2 target nucleic acids are NOT DETECTED.  The SARS-CoV-2 RNA is generally detectable in upper respiratory specimens during the acute phase of infection. The lowest concentration of SARS-CoV-2 viral copies this assay can detect is 138 copies/mL. A negative result does not preclude SARS-Cov-2 infection and should not be used as the sole basis for treatment or other patient management decisions. A negative result may occur with  improper specimen collection/handling, submission of specimen other than nasopharyngeal swab, presence of viral mutation(s) within the areas targeted by this assay, and inadequate number of viral copies(<138 copies/mL). A negative result must be combined with clinical observations, patient history, and epidemiological information. The expected  result is Negative.  Fact Sheet for Patients:  EntrepreneurPulse.com.au  Fact Sheet for Healthcare Providers:  IncredibleEmployment.be  This test is no t yet approved or cleared by the [place] FDA and  has been authorized for detection and/or diagnosis of SARS-CoV-2 by FDA under an Emergency Use Authorization (EUA). This EUA will remain  in effect (meaning this test can be used) for the duration of the COVID-19 declaration under Section 564(b)(1) of the Act, 21 U.S.C.section 360bbb-3(b)(1), unless the authorization is terminated  or revoked sooner.       Influenza A by PCR NEGATIVE NEGATIVE Final   Influenza B by PCR NEGATIVE NEGATIVE Final    Comment: (NOTE) The Xpert Xpress SARS-CoV-2/FLU/RSV plus assay is intended as an aid in the diagnosis of influenza from Nasopharyngeal swab specimens and should not be used as a sole basis for treatment. Nasal washings and aspirates are unacceptable for Xpert Xpress SARS-CoV-2/FLU/RSV testing.  Fact Sheet for Patients: EntrepreneurPulse.com.au  Fact Sheet for Healthcare Providers: IncredibleEmployment.be  This test is not yet approved or cleared by the [place] FDA and has been authorized for detection and/or diagnosis of SARS-CoV-2 by FDA under an Emergency Use Authorization (EUA). This EUA will remain in effect (meaning this test can be used) for the duration of the COVID-19 declaration under Section 564(b)(1) of the Act, 21 U.S.C. section 360bbb-3(b)(1), unless the authorization is terminated or revoked.  Performed at [facility], [place] [address] [place], [place] [address]     Procedures/Studies: CT Head Wo Contrast  Result Date: 08/16/2020 CLINICAL DATA:  Altered mental status. EXAM: CT HEAD WITHOUT CONTRAST TECHNIQUE: Contiguous axial images were obtained from the base of the skull through the vertex without intravenous contrast.  COMPARISON:  July 05, 2020 FINDINGS: Brain: There is mild cerebral atrophy with widening of the extra-axial spaces and ventricular dilatation. There are areas of decreased attenuation within the white matter tracts of the supratentorial brain, consistent with microvascular disease changes. Vascular: No hyperdense vessel or unexpected calcification. Skull: Normal. Negative for fracture or focal lesion. Sinuses/Orbits: No acute finding. Other: None. IMPRESSION: 1. Generalized cerebral atrophy. 2. No acute intracranial abnormality. Electronically Signed   By: Virgina Norfolk M.D.   On: 08/16/2020 21:04   DG Chest Port 1 View  Result Date: 08/16/2020 CLINICAL DATA:  Sepsis EXAM: PORTABLE CHEST  1 VIEW COMPARISON:  None. FINDINGS: The heart size and mediastinal contours are within normal limits. Aortic knob calcifications are seen. Both lungs are clear. The visualized skeletal structures are unremarkable. IMPRESSION: No active disease. Electronically Signed   By: Prudencio Pair M.D.   On: 08/16/2020 19:45     Time coordinating discharge: Over 30 minutes    Sergio Dee, MD  [facility] 08/30/2020, 4:02 PM

## 2020-09-05 ENCOUNTER — Telehealth: Payer: Self-pay | Admitting: Family Medicine

## 2020-09-05 DIAGNOSIS — R338 Other retention of urine: Secondary | ICD-10-CM | POA: Diagnosis not present

## 2020-09-05 DIAGNOSIS — Z7689 Persons encountering health services in other specified circumstances: Secondary | ICD-10-CM | POA: Diagnosis not present

## 2020-09-05 DIAGNOSIS — Z960 Presence of urogenital implants: Secondary | ICD-10-CM | POA: Diagnosis not present

## 2020-09-05 DIAGNOSIS — N138 Other obstructive and reflux uropathy: Secondary | ICD-10-CM | POA: Diagnosis not present

## 2020-09-05 DIAGNOSIS — Z9189 Other specified personal risk factors, not elsewhere classified: Secondary | ICD-10-CM | POA: Diagnosis not present

## 2020-09-05 DIAGNOSIS — N170 Acute kidney failure with tubular necrosis: Secondary | ICD-10-CM | POA: Diagnosis not present

## 2020-09-05 DIAGNOSIS — N401 Enlarged prostate with lower urinary tract symptoms: Secondary | ICD-10-CM | POA: Diagnosis not present

## 2020-09-05 NOTE — Telephone Encounter (Signed)
Received call to check status of orders faxed to [facility]: unsure of exact fax date. Informed caller Sergio [name] no longer at this office. Orders faxed to [phone]. Informed caller not our fax number Gave him number to [place] new office. He will call back if needed. Please advise at [phone].

## 2020-09-13 DIAGNOSIS — Z86711 Personal history of pulmonary embolism: Secondary | ICD-10-CM | POA: Diagnosis not present

## 2020-09-13 DIAGNOSIS — E1122 Type 2 diabetes mellitus with diabetic chronic kidney disease: Secondary | ICD-10-CM | POA: Diagnosis not present

## 2020-09-13 DIAGNOSIS — N281 Cyst of kidney, acquired: Secondary | ICD-10-CM | POA: Diagnosis not present

## 2020-09-13 DIAGNOSIS — Z87891 Personal history of nicotine dependence: Secondary | ICD-10-CM | POA: Diagnosis not present

## 2020-09-13 DIAGNOSIS — D631 Anemia in chronic kidney disease: Secondary | ICD-10-CM | POA: Diagnosis not present

## 2020-09-13 DIAGNOSIS — Z86718 Personal history of other venous thrombosis and embolism: Secondary | ICD-10-CM | POA: Diagnosis not present

## 2020-09-13 DIAGNOSIS — M10072 Idiopathic gout, left ankle and foot: Secondary | ICD-10-CM | POA: Diagnosis not present

## 2020-09-13 DIAGNOSIS — Z86010 Personal history of colonic polyps: Secondary | ICD-10-CM | POA: Diagnosis not present

## 2020-09-13 DIAGNOSIS — N1330 Unspecified hydronephrosis: Secondary | ICD-10-CM | POA: Diagnosis not present

## 2020-09-13 DIAGNOSIS — E7849 Other hyperlipidemia: Secondary | ICD-10-CM | POA: Diagnosis not present

## 2020-09-13 DIAGNOSIS — I129 Hypertensive chronic kidney disease with stage 1 through stage 4 chronic kidney disease, or unspecified chronic kidney disease: Secondary | ICD-10-CM | POA: Diagnosis not present

## 2020-09-13 DIAGNOSIS — K5900 Constipation, unspecified: Secondary | ICD-10-CM | POA: Diagnosis not present

## 2020-09-13 DIAGNOSIS — Z7901 Long term (current) use of anticoagulants: Secondary | ICD-10-CM | POA: Diagnosis not present

## 2020-09-13 DIAGNOSIS — K8020 Calculus of gallbladder without cholecystitis without obstruction: Secondary | ICD-10-CM | POA: Diagnosis not present

## 2020-09-13 DIAGNOSIS — N1832 Chronic kidney disease, stage 3b: Secondary | ICD-10-CM | POA: Diagnosis not present

## 2020-09-13 DIAGNOSIS — N138 Other obstructive and reflux uropathy: Secondary | ICD-10-CM | POA: Diagnosis not present

## 2020-09-13 DIAGNOSIS — Z466 Encounter for fitting and adjustment of urinary device: Secondary | ICD-10-CM | POA: Diagnosis not present

## 2020-09-13 DIAGNOSIS — M10071 Idiopathic gout, right ankle and foot: Secondary | ICD-10-CM | POA: Diagnosis not present

## 2020-09-13 DIAGNOSIS — Z9181 History of falling: Secondary | ICD-10-CM | POA: Diagnosis not present

## 2020-09-13 DIAGNOSIS — N401 Enlarged prostate with lower urinary tract symptoms: Secondary | ICD-10-CM | POA: Diagnosis not present

## 2020-09-13 DIAGNOSIS — Z7984 Long term (current) use of oral hypoglycemic drugs: Secondary | ICD-10-CM | POA: Diagnosis not present

## 2020-09-13 DIAGNOSIS — Z993 Dependence on wheelchair: Secondary | ICD-10-CM | POA: Diagnosis not present

## 2020-09-13 DIAGNOSIS — S81011D Laceration without foreign body, right knee, subsequent encounter: Secondary | ICD-10-CM | POA: Diagnosis not present

## 2020-09-18 DIAGNOSIS — D631 Anemia in chronic kidney disease: Secondary | ICD-10-CM | POA: Diagnosis not present

## 2020-09-18 DIAGNOSIS — S81011D Laceration without foreign body, right knee, subsequent encounter: Secondary | ICD-10-CM | POA: Diagnosis not present

## 2020-09-18 DIAGNOSIS — Z86010 Personal history of colonic polyps: Secondary | ICD-10-CM | POA: Diagnosis not present

## 2020-09-18 DIAGNOSIS — I129 Hypertensive chronic kidney disease with stage 1 through stage 4 chronic kidney disease, or unspecified chronic kidney disease: Secondary | ICD-10-CM | POA: Diagnosis not present

## 2020-09-18 DIAGNOSIS — K5900 Constipation, unspecified: Secondary | ICD-10-CM | POA: Diagnosis not present

## 2020-09-18 DIAGNOSIS — Z7984 Long term (current) use of oral hypoglycemic drugs: Secondary | ICD-10-CM | POA: Diagnosis not present

## 2020-09-18 DIAGNOSIS — N281 Cyst of kidney, acquired: Secondary | ICD-10-CM | POA: Diagnosis not present

## 2020-09-18 DIAGNOSIS — M10072 Idiopathic gout, left ankle and foot: Secondary | ICD-10-CM | POA: Diagnosis not present

## 2020-09-18 DIAGNOSIS — Z7901 Long term (current) use of anticoagulants: Secondary | ICD-10-CM | POA: Diagnosis not present

## 2020-09-18 DIAGNOSIS — Z466 Encounter for fitting and adjustment of urinary device: Secondary | ICD-10-CM | POA: Diagnosis not present

## 2020-09-18 DIAGNOSIS — N401 Enlarged prostate with lower urinary tract symptoms: Secondary | ICD-10-CM | POA: Diagnosis not present

## 2020-09-18 DIAGNOSIS — Z993 Dependence on wheelchair: Secondary | ICD-10-CM | POA: Diagnosis not present

## 2020-09-18 DIAGNOSIS — Z86718 Personal history of other venous thrombosis and embolism: Secondary | ICD-10-CM | POA: Diagnosis not present

## 2020-09-18 DIAGNOSIS — K8020 Calculus of gallbladder without cholecystitis without obstruction: Secondary | ICD-10-CM | POA: Diagnosis not present

## 2020-09-18 DIAGNOSIS — Z86711 Personal history of pulmonary embolism: Secondary | ICD-10-CM | POA: Diagnosis not present

## 2020-09-18 DIAGNOSIS — N1832 Chronic kidney disease, stage 3b: Secondary | ICD-10-CM | POA: Diagnosis not present

## 2020-09-18 DIAGNOSIS — E7849 Other hyperlipidemia: Secondary | ICD-10-CM | POA: Diagnosis not present

## 2020-09-18 DIAGNOSIS — M10071 Idiopathic gout, right ankle and foot: Secondary | ICD-10-CM | POA: Diagnosis not present

## 2020-09-18 DIAGNOSIS — Z87891 Personal history of nicotine dependence: Secondary | ICD-10-CM | POA: Diagnosis not present

## 2020-09-18 DIAGNOSIS — N138 Other obstructive and reflux uropathy: Secondary | ICD-10-CM | POA: Diagnosis not present

## 2020-09-18 DIAGNOSIS — E1122 Type 2 diabetes mellitus with diabetic chronic kidney disease: Secondary | ICD-10-CM | POA: Diagnosis not present

## 2020-09-18 DIAGNOSIS — Z9181 History of falling: Secondary | ICD-10-CM | POA: Diagnosis not present

## 2020-09-18 DIAGNOSIS — N1330 Unspecified hydronephrosis: Secondary | ICD-10-CM | POA: Diagnosis not present

## 2020-09-25 DIAGNOSIS — K8020 Calculus of gallbladder without cholecystitis without obstruction: Secondary | ICD-10-CM | POA: Diagnosis not present

## 2020-09-25 DIAGNOSIS — N401 Enlarged prostate with lower urinary tract symptoms: Secondary | ICD-10-CM | POA: Diagnosis not present

## 2020-09-25 DIAGNOSIS — Z86718 Personal history of other venous thrombosis and embolism: Secondary | ICD-10-CM | POA: Diagnosis not present

## 2020-09-25 DIAGNOSIS — E7849 Other hyperlipidemia: Secondary | ICD-10-CM | POA: Diagnosis not present

## 2020-09-25 DIAGNOSIS — S81011D Laceration without foreign body, right knee, subsequent encounter: Secondary | ICD-10-CM | POA: Diagnosis not present

## 2020-09-25 DIAGNOSIS — N281 Cyst of kidney, acquired: Secondary | ICD-10-CM | POA: Diagnosis not present

## 2020-09-25 DIAGNOSIS — I129 Hypertensive chronic kidney disease with stage 1 through stage 4 chronic kidney disease, or unspecified chronic kidney disease: Secondary | ICD-10-CM | POA: Diagnosis not present

## 2020-09-25 DIAGNOSIS — D631 Anemia in chronic kidney disease: Secondary | ICD-10-CM | POA: Diagnosis not present

## 2020-09-25 DIAGNOSIS — N1832 Chronic kidney disease, stage 3b: Secondary | ICD-10-CM | POA: Diagnosis not present

## 2020-09-25 DIAGNOSIS — M10071 Idiopathic gout, right ankle and foot: Secondary | ICD-10-CM | POA: Diagnosis not present

## 2020-09-25 DIAGNOSIS — Z9181 History of falling: Secondary | ICD-10-CM | POA: Diagnosis not present

## 2020-09-25 DIAGNOSIS — Z7901 Long term (current) use of anticoagulants: Secondary | ICD-10-CM | POA: Diagnosis not present

## 2020-09-25 DIAGNOSIS — Z466 Encounter for fitting and adjustment of urinary device: Secondary | ICD-10-CM | POA: Diagnosis not present

## 2020-09-25 DIAGNOSIS — Z86010 Personal history of colonic polyps: Secondary | ICD-10-CM | POA: Diagnosis not present

## 2020-09-25 DIAGNOSIS — N138 Other obstructive and reflux uropathy: Secondary | ICD-10-CM | POA: Diagnosis not present

## 2020-09-25 DIAGNOSIS — M10072 Idiopathic gout, left ankle and foot: Secondary | ICD-10-CM | POA: Diagnosis not present

## 2020-09-25 DIAGNOSIS — Z993 Dependence on wheelchair: Secondary | ICD-10-CM | POA: Diagnosis not present

## 2020-09-25 DIAGNOSIS — K5900 Constipation, unspecified: Secondary | ICD-10-CM | POA: Diagnosis not present

## 2020-09-25 DIAGNOSIS — Z86711 Personal history of pulmonary embolism: Secondary | ICD-10-CM | POA: Diagnosis not present

## 2020-09-25 DIAGNOSIS — E1122 Type 2 diabetes mellitus with diabetic chronic kidney disease: Secondary | ICD-10-CM | POA: Diagnosis not present

## 2020-09-25 DIAGNOSIS — Z7984 Long term (current) use of oral hypoglycemic drugs: Secondary | ICD-10-CM | POA: Diagnosis not present

## 2020-09-25 DIAGNOSIS — Z87891 Personal history of nicotine dependence: Secondary | ICD-10-CM | POA: Diagnosis not present

## 2020-09-25 DIAGNOSIS — N1330 Unspecified hydronephrosis: Secondary | ICD-10-CM | POA: Diagnosis not present

## 2020-10-01 DIAGNOSIS — K5900 Constipation, unspecified: Secondary | ICD-10-CM | POA: Diagnosis not present

## 2020-10-01 DIAGNOSIS — E1122 Type 2 diabetes mellitus with diabetic chronic kidney disease: Secondary | ICD-10-CM | POA: Diagnosis not present

## 2020-10-01 DIAGNOSIS — S81011D Laceration without foreign body, right knee, subsequent encounter: Secondary | ICD-10-CM | POA: Diagnosis not present

## 2020-10-01 DIAGNOSIS — Z87891 Personal history of nicotine dependence: Secondary | ICD-10-CM | POA: Diagnosis not present

## 2020-10-01 DIAGNOSIS — N138 Other obstructive and reflux uropathy: Secondary | ICD-10-CM | POA: Diagnosis not present

## 2020-10-01 DIAGNOSIS — N281 Cyst of kidney, acquired: Secondary | ICD-10-CM | POA: Diagnosis not present

## 2020-10-01 DIAGNOSIS — Z86711 Personal history of pulmonary embolism: Secondary | ICD-10-CM | POA: Diagnosis not present

## 2020-10-01 DIAGNOSIS — M10071 Idiopathic gout, right ankle and foot: Secondary | ICD-10-CM | POA: Diagnosis not present

## 2020-10-01 DIAGNOSIS — D631 Anemia in chronic kidney disease: Secondary | ICD-10-CM | POA: Diagnosis not present

## 2020-10-01 DIAGNOSIS — Z7901 Long term (current) use of anticoagulants: Secondary | ICD-10-CM | POA: Diagnosis not present

## 2020-10-01 DIAGNOSIS — N1330 Unspecified hydronephrosis: Secondary | ICD-10-CM | POA: Diagnosis not present

## 2020-10-01 DIAGNOSIS — Z86010 Personal history of colonic polyps: Secondary | ICD-10-CM | POA: Diagnosis not present

## 2020-10-01 DIAGNOSIS — Z7984 Long term (current) use of oral hypoglycemic drugs: Secondary | ICD-10-CM | POA: Diagnosis not present

## 2020-10-01 DIAGNOSIS — M10072 Idiopathic gout, left ankle and foot: Secondary | ICD-10-CM | POA: Diagnosis not present

## 2020-10-01 DIAGNOSIS — Z993 Dependence on wheelchair: Secondary | ICD-10-CM | POA: Diagnosis not present

## 2020-10-01 DIAGNOSIS — N401 Enlarged prostate with lower urinary tract symptoms: Secondary | ICD-10-CM | POA: Diagnosis not present

## 2020-10-01 DIAGNOSIS — I129 Hypertensive chronic kidney disease with stage 1 through stage 4 chronic kidney disease, or unspecified chronic kidney disease: Secondary | ICD-10-CM | POA: Diagnosis not present

## 2020-10-01 DIAGNOSIS — Z86718 Personal history of other venous thrombosis and embolism: Secondary | ICD-10-CM | POA: Diagnosis not present

## 2020-10-01 DIAGNOSIS — N1832 Chronic kidney disease, stage 3b: Secondary | ICD-10-CM | POA: Diagnosis not present

## 2020-10-01 DIAGNOSIS — Z466 Encounter for fitting and adjustment of urinary device: Secondary | ICD-10-CM | POA: Diagnosis not present

## 2020-10-01 DIAGNOSIS — K8020 Calculus of gallbladder without cholecystitis without obstruction: Secondary | ICD-10-CM | POA: Diagnosis not present

## 2020-10-01 DIAGNOSIS — Z9181 History of falling: Secondary | ICD-10-CM | POA: Diagnosis not present

## 2020-10-01 DIAGNOSIS — E7849 Other hyperlipidemia: Secondary | ICD-10-CM | POA: Diagnosis not present

## 2020-10-25 DIAGNOSIS — I129 Hypertensive chronic kidney disease with stage 1 through stage 4 chronic kidney disease, or unspecified chronic kidney disease: Secondary | ICD-10-CM | POA: Diagnosis not present

## 2020-10-25 DIAGNOSIS — E7849 Other hyperlipidemia: Secondary | ICD-10-CM | POA: Diagnosis not present

## 2020-10-25 DIAGNOSIS — Z87891 Personal history of nicotine dependence: Secondary | ICD-10-CM | POA: Diagnosis not present

## 2020-10-25 DIAGNOSIS — Z9181 History of falling: Secondary | ICD-10-CM | POA: Diagnosis not present

## 2020-10-25 DIAGNOSIS — E1122 Type 2 diabetes mellitus with diabetic chronic kidney disease: Secondary | ICD-10-CM | POA: Diagnosis not present

## 2020-10-25 DIAGNOSIS — Z993 Dependence on wheelchair: Secondary | ICD-10-CM | POA: Diagnosis not present

## 2020-10-25 DIAGNOSIS — K5900 Constipation, unspecified: Secondary | ICD-10-CM | POA: Diagnosis not present

## 2020-10-25 DIAGNOSIS — Z86711 Personal history of pulmonary embolism: Secondary | ICD-10-CM | POA: Diagnosis not present

## 2020-10-25 DIAGNOSIS — N1832 Chronic kidney disease, stage 3b: Secondary | ICD-10-CM | POA: Diagnosis not present

## 2020-10-25 DIAGNOSIS — N281 Cyst of kidney, acquired: Secondary | ICD-10-CM | POA: Diagnosis not present

## 2020-10-25 DIAGNOSIS — Z7984 Long term (current) use of oral hypoglycemic drugs: Secondary | ICD-10-CM | POA: Diagnosis not present

## 2020-10-25 DIAGNOSIS — S81011D Laceration without foreign body, right knee, subsequent encounter: Secondary | ICD-10-CM | POA: Diagnosis not present

## 2020-10-25 DIAGNOSIS — Z7901 Long term (current) use of anticoagulants: Secondary | ICD-10-CM | POA: Diagnosis not present

## 2020-10-25 DIAGNOSIS — Z86718 Personal history of other venous thrombosis and embolism: Secondary | ICD-10-CM | POA: Diagnosis not present

## 2020-10-25 DIAGNOSIS — N1330 Unspecified hydronephrosis: Secondary | ICD-10-CM | POA: Diagnosis not present

## 2020-10-25 DIAGNOSIS — Z466 Encounter for fitting and adjustment of urinary device: Secondary | ICD-10-CM | POA: Diagnosis not present

## 2020-10-25 DIAGNOSIS — N401 Enlarged prostate with lower urinary tract symptoms: Secondary | ICD-10-CM | POA: Diagnosis not present

## 2020-10-25 DIAGNOSIS — D631 Anemia in chronic kidney disease: Secondary | ICD-10-CM | POA: Diagnosis not present

## 2020-10-25 DIAGNOSIS — M10071 Idiopathic gout, right ankle and foot: Secondary | ICD-10-CM | POA: Diagnosis not present

## 2020-10-25 DIAGNOSIS — K8020 Calculus of gallbladder without cholecystitis without obstruction: Secondary | ICD-10-CM | POA: Diagnosis not present

## 2020-10-25 DIAGNOSIS — M10072 Idiopathic gout, left ankle and foot: Secondary | ICD-10-CM | POA: Diagnosis not present

## 2020-10-25 DIAGNOSIS — Z86010 Personal history of colonic polyps: Secondary | ICD-10-CM | POA: Diagnosis not present

## 2020-10-25 DIAGNOSIS — N138 Other obstructive and reflux uropathy: Secondary | ICD-10-CM | POA: Diagnosis not present

## 2020-11-06 DIAGNOSIS — M10071 Idiopathic gout, right ankle and foot: Secondary | ICD-10-CM | POA: Diagnosis not present

## 2020-11-06 DIAGNOSIS — I129 Hypertensive chronic kidney disease with stage 1 through stage 4 chronic kidney disease, or unspecified chronic kidney disease: Secondary | ICD-10-CM | POA: Diagnosis not present

## 2020-11-06 DIAGNOSIS — K8020 Calculus of gallbladder without cholecystitis without obstruction: Secondary | ICD-10-CM | POA: Diagnosis not present

## 2020-11-06 DIAGNOSIS — Z466 Encounter for fitting and adjustment of urinary device: Secondary | ICD-10-CM | POA: Diagnosis not present

## 2020-11-06 DIAGNOSIS — Z86718 Personal history of other venous thrombosis and embolism: Secondary | ICD-10-CM | POA: Diagnosis not present

## 2020-11-06 DIAGNOSIS — S81011D Laceration without foreign body, right knee, subsequent encounter: Secondary | ICD-10-CM | POA: Diagnosis not present

## 2020-11-06 DIAGNOSIS — N1330 Unspecified hydronephrosis: Secondary | ICD-10-CM | POA: Diagnosis not present

## 2020-11-06 DIAGNOSIS — M10072 Idiopathic gout, left ankle and foot: Secondary | ICD-10-CM | POA: Diagnosis not present

## 2020-11-06 DIAGNOSIS — N1832 Chronic kidney disease, stage 3b: Secondary | ICD-10-CM | POA: Diagnosis not present

## 2020-11-06 DIAGNOSIS — N138 Other obstructive and reflux uropathy: Secondary | ICD-10-CM | POA: Diagnosis not present

## 2020-11-06 DIAGNOSIS — K5900 Constipation, unspecified: Secondary | ICD-10-CM | POA: Diagnosis not present

## 2020-11-06 DIAGNOSIS — N281 Cyst of kidney, acquired: Secondary | ICD-10-CM | POA: Diagnosis not present

## 2020-11-06 DIAGNOSIS — Z86010 Personal history of colonic polyps: Secondary | ICD-10-CM | POA: Diagnosis not present

## 2020-11-06 DIAGNOSIS — E7849 Other hyperlipidemia: Secondary | ICD-10-CM | POA: Diagnosis not present

## 2020-11-06 DIAGNOSIS — Z993 Dependence on wheelchair: Secondary | ICD-10-CM | POA: Diagnosis not present

## 2020-11-06 DIAGNOSIS — Z7984 Long term (current) use of oral hypoglycemic drugs: Secondary | ICD-10-CM | POA: Diagnosis not present

## 2020-11-06 DIAGNOSIS — Z7901 Long term (current) use of anticoagulants: Secondary | ICD-10-CM | POA: Diagnosis not present

## 2020-11-06 DIAGNOSIS — D631 Anemia in chronic kidney disease: Secondary | ICD-10-CM | POA: Diagnosis not present

## 2020-11-06 DIAGNOSIS — Z9181 History of falling: Secondary | ICD-10-CM | POA: Diagnosis not present

## 2020-11-06 DIAGNOSIS — Z86711 Personal history of pulmonary embolism: Secondary | ICD-10-CM | POA: Diagnosis not present

## 2020-11-06 DIAGNOSIS — E1122 Type 2 diabetes mellitus with diabetic chronic kidney disease: Secondary | ICD-10-CM | POA: Diagnosis not present

## 2020-11-06 DIAGNOSIS — N401 Enlarged prostate with lower urinary tract symptoms: Secondary | ICD-10-CM | POA: Diagnosis not present

## 2020-11-06 DIAGNOSIS — Z87891 Personal history of nicotine dependence: Secondary | ICD-10-CM | POA: Diagnosis not present

## 2020-11-10 DIAGNOSIS — Z86718 Personal history of other venous thrombosis and embolism: Secondary | ICD-10-CM | POA: Diagnosis not present

## 2020-11-10 DIAGNOSIS — Z466 Encounter for fitting and adjustment of urinary device: Secondary | ICD-10-CM | POA: Diagnosis not present

## 2020-11-10 DIAGNOSIS — Z993 Dependence on wheelchair: Secondary | ICD-10-CM | POA: Diagnosis not present

## 2020-11-10 DIAGNOSIS — Z7901 Long term (current) use of anticoagulants: Secondary | ICD-10-CM | POA: Diagnosis not present

## 2020-11-10 DIAGNOSIS — Z86711 Personal history of pulmonary embolism: Secondary | ICD-10-CM | POA: Diagnosis not present

## 2020-11-10 DIAGNOSIS — D631 Anemia in chronic kidney disease: Secondary | ICD-10-CM | POA: Diagnosis not present

## 2020-11-10 DIAGNOSIS — I129 Hypertensive chronic kidney disease with stage 1 through stage 4 chronic kidney disease, or unspecified chronic kidney disease: Secondary | ICD-10-CM | POA: Diagnosis not present

## 2020-11-10 DIAGNOSIS — Z9181 History of falling: Secondary | ICD-10-CM | POA: Diagnosis not present

## 2020-11-10 DIAGNOSIS — N1832 Chronic kidney disease, stage 3b: Secondary | ICD-10-CM | POA: Diagnosis not present

## 2020-11-10 DIAGNOSIS — Z87891 Personal history of nicotine dependence: Secondary | ICD-10-CM | POA: Diagnosis not present

## 2020-11-10 DIAGNOSIS — M10072 Idiopathic gout, left ankle and foot: Secondary | ICD-10-CM | POA: Diagnosis not present

## 2020-11-10 DIAGNOSIS — E7849 Other hyperlipidemia: Secondary | ICD-10-CM | POA: Diagnosis not present

## 2020-11-10 DIAGNOSIS — M10071 Idiopathic gout, right ankle and foot: Secondary | ICD-10-CM | POA: Diagnosis not present

## 2020-11-10 DIAGNOSIS — N401 Enlarged prostate with lower urinary tract symptoms: Secondary | ICD-10-CM | POA: Diagnosis not present

## 2020-11-10 DIAGNOSIS — Z86010 Personal history of colonic polyps: Secondary | ICD-10-CM | POA: Diagnosis not present

## 2020-11-10 DIAGNOSIS — K5900 Constipation, unspecified: Secondary | ICD-10-CM | POA: Diagnosis not present

## 2020-11-10 DIAGNOSIS — N281 Cyst of kidney, acquired: Secondary | ICD-10-CM | POA: Diagnosis not present

## 2020-11-10 DIAGNOSIS — K8020 Calculus of gallbladder without cholecystitis without obstruction: Secondary | ICD-10-CM | POA: Diagnosis not present

## 2020-11-10 DIAGNOSIS — N138 Other obstructive and reflux uropathy: Secondary | ICD-10-CM | POA: Diagnosis not present

## 2020-11-10 DIAGNOSIS — E1122 Type 2 diabetes mellitus with diabetic chronic kidney disease: Secondary | ICD-10-CM | POA: Diagnosis not present

## 2020-11-20 DIAGNOSIS — Z993 Dependence on wheelchair: Secondary | ICD-10-CM | POA: Diagnosis not present

## 2020-11-20 DIAGNOSIS — M10071 Idiopathic gout, right ankle and foot: Secondary | ICD-10-CM | POA: Diagnosis not present

## 2020-11-20 DIAGNOSIS — Z86718 Personal history of other venous thrombosis and embolism: Secondary | ICD-10-CM | POA: Diagnosis not present

## 2020-11-20 DIAGNOSIS — I129 Hypertensive chronic kidney disease with stage 1 through stage 4 chronic kidney disease, or unspecified chronic kidney disease: Secondary | ICD-10-CM | POA: Diagnosis not present

## 2020-11-20 DIAGNOSIS — N281 Cyst of kidney, acquired: Secondary | ICD-10-CM | POA: Diagnosis not present

## 2020-11-20 DIAGNOSIS — E7849 Other hyperlipidemia: Secondary | ICD-10-CM | POA: Diagnosis not present

## 2020-11-20 DIAGNOSIS — K5900 Constipation, unspecified: Secondary | ICD-10-CM | POA: Diagnosis not present

## 2020-11-20 DIAGNOSIS — Z7901 Long term (current) use of anticoagulants: Secondary | ICD-10-CM | POA: Diagnosis not present

## 2020-11-20 DIAGNOSIS — D631 Anemia in chronic kidney disease: Secondary | ICD-10-CM | POA: Diagnosis not present

## 2020-11-20 DIAGNOSIS — Z86010 Personal history of colonic polyps: Secondary | ICD-10-CM | POA: Diagnosis not present

## 2020-11-20 DIAGNOSIS — N1832 Chronic kidney disease, stage 3b: Secondary | ICD-10-CM | POA: Diagnosis not present

## 2020-11-20 DIAGNOSIS — E1122 Type 2 diabetes mellitus with diabetic chronic kidney disease: Secondary | ICD-10-CM | POA: Diagnosis not present

## 2020-11-20 DIAGNOSIS — Z9181 History of falling: Secondary | ICD-10-CM | POA: Diagnosis not present

## 2020-11-20 DIAGNOSIS — N138 Other obstructive and reflux uropathy: Secondary | ICD-10-CM | POA: Diagnosis not present

## 2020-11-20 DIAGNOSIS — Z466 Encounter for fitting and adjustment of urinary device: Secondary | ICD-10-CM | POA: Diagnosis not present

## 2020-11-20 DIAGNOSIS — Z86711 Personal history of pulmonary embolism: Secondary | ICD-10-CM | POA: Diagnosis not present

## 2020-11-20 DIAGNOSIS — Z87891 Personal history of nicotine dependence: Secondary | ICD-10-CM | POA: Diagnosis not present

## 2020-11-20 DIAGNOSIS — M10072 Idiopathic gout, left ankle and foot: Secondary | ICD-10-CM | POA: Diagnosis not present

## 2020-11-20 DIAGNOSIS — K8020 Calculus of gallbladder without cholecystitis without obstruction: Secondary | ICD-10-CM | POA: Diagnosis not present

## 2020-11-20 DIAGNOSIS — N401 Enlarged prostate with lower urinary tract symptoms: Secondary | ICD-10-CM | POA: Diagnosis not present

## 2020-11-26 DIAGNOSIS — E1122 Type 2 diabetes mellitus with diabetic chronic kidney disease: Secondary | ICD-10-CM | POA: Diagnosis not present

## 2020-11-26 DIAGNOSIS — Z86718 Personal history of other venous thrombosis and embolism: Secondary | ICD-10-CM | POA: Diagnosis not present

## 2020-11-26 DIAGNOSIS — Z9181 History of falling: Secondary | ICD-10-CM | POA: Diagnosis not present

## 2020-11-26 DIAGNOSIS — N138 Other obstructive and reflux uropathy: Secondary | ICD-10-CM | POA: Diagnosis not present

## 2020-11-26 DIAGNOSIS — D631 Anemia in chronic kidney disease: Secondary | ICD-10-CM | POA: Diagnosis not present

## 2020-11-26 DIAGNOSIS — N401 Enlarged prostate with lower urinary tract symptoms: Secondary | ICD-10-CM | POA: Diagnosis not present

## 2020-11-26 DIAGNOSIS — N1832 Chronic kidney disease, stage 3b: Secondary | ICD-10-CM | POA: Diagnosis not present

## 2020-11-26 DIAGNOSIS — N281 Cyst of kidney, acquired: Secondary | ICD-10-CM | POA: Diagnosis not present

## 2020-11-26 DIAGNOSIS — I129 Hypertensive chronic kidney disease with stage 1 through stage 4 chronic kidney disease, or unspecified chronic kidney disease: Secondary | ICD-10-CM | POA: Diagnosis not present

## 2020-11-26 DIAGNOSIS — E7849 Other hyperlipidemia: Secondary | ICD-10-CM | POA: Diagnosis not present

## 2020-11-26 DIAGNOSIS — Z993 Dependence on wheelchair: Secondary | ICD-10-CM | POA: Diagnosis not present

## 2020-11-26 DIAGNOSIS — R338 Other retention of urine: Secondary | ICD-10-CM | POA: Diagnosis not present

## 2020-11-26 DIAGNOSIS — K5900 Constipation, unspecified: Secondary | ICD-10-CM | POA: Diagnosis not present

## 2020-11-26 DIAGNOSIS — Z86010 Personal history of colonic polyps: Secondary | ICD-10-CM | POA: Diagnosis not present

## 2020-11-26 DIAGNOSIS — Z86711 Personal history of pulmonary embolism: Secondary | ICD-10-CM | POA: Diagnosis not present

## 2020-11-26 DIAGNOSIS — K8020 Calculus of gallbladder without cholecystitis without obstruction: Secondary | ICD-10-CM | POA: Diagnosis not present

## 2020-11-26 DIAGNOSIS — Z466 Encounter for fitting and adjustment of urinary device: Secondary | ICD-10-CM | POA: Diagnosis not present

## 2020-11-26 DIAGNOSIS — M10072 Idiopathic gout, left ankle and foot: Secondary | ICD-10-CM | POA: Diagnosis not present

## 2020-11-26 DIAGNOSIS — Z87891 Personal history of nicotine dependence: Secondary | ICD-10-CM | POA: Diagnosis not present

## 2020-11-26 DIAGNOSIS — Z7901 Long term (current) use of anticoagulants: Secondary | ICD-10-CM | POA: Diagnosis not present

## 2020-11-26 DIAGNOSIS — M10071 Idiopathic gout, right ankle and foot: Secondary | ICD-10-CM | POA: Diagnosis not present

## 2020-12-04 DIAGNOSIS — N281 Cyst of kidney, acquired: Secondary | ICD-10-CM | POA: Diagnosis not present

## 2020-12-04 DIAGNOSIS — I129 Hypertensive chronic kidney disease with stage 1 through stage 4 chronic kidney disease, or unspecified chronic kidney disease: Secondary | ICD-10-CM | POA: Diagnosis not present

## 2020-12-04 DIAGNOSIS — D631 Anemia in chronic kidney disease: Secondary | ICD-10-CM | POA: Diagnosis not present

## 2020-12-04 DIAGNOSIS — K5900 Constipation, unspecified: Secondary | ICD-10-CM | POA: Diagnosis not present

## 2020-12-04 DIAGNOSIS — N1832 Chronic kidney disease, stage 3b: Secondary | ICD-10-CM | POA: Diagnosis not present

## 2020-12-04 DIAGNOSIS — E1122 Type 2 diabetes mellitus with diabetic chronic kidney disease: Secondary | ICD-10-CM | POA: Diagnosis not present

## 2020-12-04 DIAGNOSIS — Z86711 Personal history of pulmonary embolism: Secondary | ICD-10-CM | POA: Diagnosis not present

## 2020-12-04 DIAGNOSIS — M10072 Idiopathic gout, left ankle and foot: Secondary | ICD-10-CM | POA: Diagnosis not present

## 2020-12-04 DIAGNOSIS — Z466 Encounter for fitting and adjustment of urinary device: Secondary | ICD-10-CM | POA: Diagnosis not present

## 2020-12-04 DIAGNOSIS — Z86010 Personal history of colonic polyps: Secondary | ICD-10-CM | POA: Diagnosis not present

## 2020-12-04 DIAGNOSIS — Z7901 Long term (current) use of anticoagulants: Secondary | ICD-10-CM | POA: Diagnosis not present

## 2020-12-04 DIAGNOSIS — Z9181 History of falling: Secondary | ICD-10-CM | POA: Diagnosis not present

## 2020-12-04 DIAGNOSIS — Z993 Dependence on wheelchair: Secondary | ICD-10-CM | POA: Diagnosis not present

## 2020-12-04 DIAGNOSIS — K8020 Calculus of gallbladder without cholecystitis without obstruction: Secondary | ICD-10-CM | POA: Diagnosis not present

## 2020-12-04 DIAGNOSIS — Z86718 Personal history of other venous thrombosis and embolism: Secondary | ICD-10-CM | POA: Diagnosis not present

## 2020-12-04 DIAGNOSIS — Z87891 Personal history of nicotine dependence: Secondary | ICD-10-CM | POA: Diagnosis not present

## 2020-12-04 DIAGNOSIS — E7849 Other hyperlipidemia: Secondary | ICD-10-CM | POA: Diagnosis not present

## 2020-12-04 DIAGNOSIS — M10071 Idiopathic gout, right ankle and foot: Secondary | ICD-10-CM | POA: Diagnosis not present

## 2020-12-04 DIAGNOSIS — N138 Other obstructive and reflux uropathy: Secondary | ICD-10-CM | POA: Diagnosis not present

## 2020-12-04 DIAGNOSIS — N401 Enlarged prostate with lower urinary tract symptoms: Secondary | ICD-10-CM | POA: Diagnosis not present

## 2021-02-20 ENCOUNTER — Emergency Department (HOSPITAL_COMMUNITY): Payer: PPO

## 2021-02-20 ENCOUNTER — Emergency Department (HOSPITAL_COMMUNITY)
Admission: EM | Admit: 2021-02-20 | Discharge: 2021-02-20 | Disposition: A | Discharge status: Home / Self Care | Payer: PPO | Attending: Emergency Medicine | Admitting: Emergency Medicine

## 2021-02-20 ENCOUNTER — Other Ambulatory Visit: Payer: Self-pay

## 2021-02-20 ENCOUNTER — Telehealth: Payer: Self-pay | Admitting: Cardiology

## 2021-02-20 ENCOUNTER — Other Ambulatory Visit: Payer: Self-pay | Admitting: Cardiology

## 2021-02-20 DIAGNOSIS — Z7901 Long term (current) use of anticoagulants: Secondary | ICD-10-CM | POA: Diagnosis not present

## 2021-02-20 DIAGNOSIS — R55 Syncope and collapse: Secondary | ICD-10-CM | POA: Diagnosis not present

## 2021-02-20 DIAGNOSIS — I10 Essential (primary) hypertension: Secondary | ICD-10-CM | POA: Diagnosis not present

## 2021-02-20 DIAGNOSIS — Z79899 Other long term (current) drug therapy: Secondary | ICD-10-CM | POA: Insufficient documentation

## 2021-02-20 DIAGNOSIS — Z87891 Personal history of nicotine dependence: Secondary | ICD-10-CM | POA: Diagnosis not present

## 2021-02-20 DIAGNOSIS — I959 Hypotension, unspecified: Secondary | ICD-10-CM | POA: Diagnosis not present

## 2021-02-20 DIAGNOSIS — R410 Disorientation, unspecified: Secondary | ICD-10-CM | POA: Diagnosis not present

## 2021-02-20 DIAGNOSIS — R404 Transient alteration of awareness: Secondary | ICD-10-CM | POA: Diagnosis not present

## 2021-02-20 DIAGNOSIS — E119 Type 2 diabetes mellitus without complications: Secondary | ICD-10-CM | POA: Insufficient documentation

## 2021-02-20 DIAGNOSIS — I499 Cardiac arrhythmia, unspecified: Secondary | ICD-10-CM | POA: Diagnosis not present

## 2021-02-20 DIAGNOSIS — R112 Nausea with vomiting, unspecified: Secondary | ICD-10-CM | POA: Diagnosis not present

## 2021-02-20 DIAGNOSIS — R9431 Abnormal electrocardiogram [ECG] [EKG]: Secondary | ICD-10-CM | POA: Diagnosis not present

## 2021-02-20 LAB — CBG MONITORING, ED: Glucose-Capillary: 111 mg/dL — ABNORMAL HIGH (ref 70–99)

## 2021-02-20 LAB — CBC WITH DIFFERENTIAL/PLATELET
Abs Immature Granulocytes: 0.03 10*3/uL (ref 0.00–0.07)
Basophils Absolute: 0 10*3/uL (ref 0.0–0.1)
Basophils Relative: 0 %
Eosinophils Absolute: 0.1 10*3/uL (ref 0.0–0.5)
Eosinophils Relative: 2 %
HCT: 33.7 % — ABNORMAL LOW (ref 39.0–52.0)
Hemoglobin: 10.7 g/dL — ABNORMAL LOW (ref 13.0–17.0)
Immature Granulocytes: 0 %
Lymphocytes Relative: 22 %
Lymphs Abs: 1.5 10*3/uL (ref 0.7–4.0)
MCH: 27.7 pg (ref 26.0–34.0)
MCHC: 31.8 g/dL (ref 30.0–36.0)
MCV: 87.3 fL (ref 80.0–100.0)
Monocytes Absolute: 0.7 10*3/uL (ref 0.1–1.0)
Monocytes Relative: 9 %
Neutro Abs: 4.6 10*3/uL (ref 1.7–7.7)
Neutrophils Relative %: 67 %
Platelets: 226 10*3/uL (ref 150–400)
RBC: 3.86 MIL/uL — ABNORMAL LOW (ref 4.22–5.81)
RDW: 15.2 % (ref 11.5–15.5)
WBC: 6.9 10*3/uL (ref 4.0–10.5)
nRBC: 0 % (ref 0.0–0.2)

## 2021-02-20 LAB — COMPREHENSIVE METABOLIC PANEL
ALT: 9 U/L (ref 0–44)
AST: 29 U/L (ref 15–41)
Albumin: 2.9 g/dL — ABNORMAL LOW (ref 3.5–5.0)
Alkaline Phosphatase: 42 U/L (ref 38–126)
Anion gap: 5 (ref 5–15)
BUN: 28 mg/dL — ABNORMAL HIGH (ref 8–23)
CO2: 24 mmol/L (ref 22–32)
Calcium: 8.5 mg/dL — ABNORMAL LOW (ref 8.9–10.3)
Chloride: 110 mmol/L (ref 98–111)
Creatinine, Ser: 2.06 mg/dL — ABNORMAL HIGH (ref 0.61–1.24)
GFR, Estimated: 31 mL/min — ABNORMAL LOW (ref >60)
Glucose, Bld: 100 mg/dL — ABNORMAL HIGH (ref 70–99)
Potassium: 5.6 mmol/L — ABNORMAL HIGH (ref 3.5–5.1)
Sodium: 139 mmol/L (ref 135–145)
Total Bilirubin: 0.9 mg/dL (ref 0.3–1.2)
Total Protein: 7.2 g/dL (ref 6.5–8.1)

## 2021-02-20 LAB — TROPONIN I (HIGH SENSITIVITY): Troponin I (High Sensitivity): 11 ng/L (ref <18)

## 2021-02-20 LAB — POTASSIUM: Potassium: 5.1 mmol/L (ref 3.5–5.1)

## 2021-02-20 NOTE — Telephone Encounter (Signed)
   [facility] asked to placed cardiac monitor in the setting of pre-syncope/dizziness. Orders placed. Monitor to be read by Dr. Gardiner Rhyme (DOD) as the patient has not been evaluated by cardiology in the past.

## 2021-02-20 NOTE — ED Triage Notes (Signed)
Pt BIB [facility] from home c/o a near syncopal event. Pt was walking into the house fet bad so he sat down on the stairs, had an episode of vomitting and would not respond to the son. Upon EMS arrival pt was alert and talking but pt does seem confused but that could possibly be normal.

## 2021-02-20 NOTE — ED Provider Notes (Signed)
[facility] EMERGENCY DEPARTMENT Provider Note   CSN: [id number] Arrival date & time: 02/20/21  1446     History Chief Complaint  Patient presents with  . Near Syncope    Sergio [name] is a 87 y.o. male.  HPI Patient is an 87 year old male with past medical history of DM2, HTN, PE, AKI, hypoglycemia  Patient is presented to the ER with an episode of nausea and vomiting. Patient is a relatively poor historian and does not recall the event.  Per EMS they were called at approximately 2:15 PM to the house of the patient where he lives with his son.  When they arrived patient was ambulatory, conversational and able answer questions and oriented apart from getting the year wrong which patient's son confirms this is baseline.  Apparently patient was out on the porch and had 1 episode of vomiting.  It was nonbloody nonbilious per EMS.  He denies any symptoms presently.  Specifically denies any chest pain, abdominal pain, nausea, vomiting, headache, lightheadedness or dizziness.  Patient does not recall the episode of vomiting.  No fevers or chills.  Son confirms the patient was well-appearing this morning has had no symptoms that he knows of apart from the episode of vomiting.    Past Medical History:  Diagnosis Date  . Colon polyps 06/22/2014   4 colon polyps by colonoscopy.  Repeat 3 years.  Hung.  . Diabetes mellitus   . Hypertension   . Other and unspecified hyperlipidemia   . PE (pulmonary embolism)     Patient Active Problem List   Diagnosis Date Noted  . Essential hypertension 08/29/2020  . Chronic indwelling Foley catheter 08/19/2020  . History of pulmonary embolus (PE) 08/19/2020  . Severe sepsis ([place]) 08/17/2020  . Sepsis due to urinary tract infection ([place]) 08/16/2020  . AKI (acute kidney injury) ([place]) 06/30/2020  . Hydronephrosis 06/30/2020  . Pain due to onychomycosis of toenails of both feet 10/13/2019  . Urinary frequency 10/01/2017  . Type 2  diabetes mellitus without complication, without long-term current use of insulin ([place]) 10/01/2017  . Loss of weight 03/24/2017  . Hypoglycemia 03/24/2017  . Iatrogenic hypotension 03/24/2017  . Nonspecific abnormal electrocardiogram (ECG) (EKG) 02/02/2017  . Colon polyps 07/04/2014  . Controlled diabetes mellitus without long-term current use of insulin ([place]) 12/18/2011  . Uncontrolled hypertension   . Other and unspecified hyperlipidemia     Past Surgical History:  Procedure Laterality Date  . COLONOSCOPY W/ POLYPECTOMY  06/22/2014   4 colon polyps; repeat 3 years; [place].       Family History  Problem Relation Age of Onset  . Diabetes Other     Social History   Tobacco Use  . Smoking status: Former Smoker    Years: 17.00    Types: Cigars    Quit date: 09/29/1975    Years since quitting: 45.4  . Smokeless tobacco: Never Used  Substance Use Topics  . Alcohol use: No  . Drug use: No    Home Medications Prior to Admission medications   Medication Sig Start Date End Date Taking? Authorizing Provider  acetaminophen (TYLENOL) 500 MG tablet Take 500 mg by mouth every 6 (six) hours as needed for mild pain.     [provider]  amLODipine (NORVASC) 10 MG tablet Take 1 tablet (10 mg total) by mouth daily. 07/06/20 09/04/20  Antonieta Pert, MD  apixaban (ELIQUIS) 5 MG TABS tablet Take 1 tablet (5 mg total) by mouth 2 (two) times  daily. 08/19/20   Dwyane Dee, MD  hydrALAZINE (APRESOLINE) 25 MG tablet Take 1 tablet (25 mg total) by mouth 3 (three) times daily. 07/06/20 08/29/20  Antonieta Pert, MD  ondansetron (ZOFRAN) 4 MG tablet Take 1 tablet (4 mg total) by mouth every 8 (eight) hours as needed for nausea or vomiting. 01/21/20   Hayden Rasmussen, MD  tamsulosin (FLOMAX) 0.4 MG CAPS capsule Take 1 capsule (0.4 mg total) by mouth daily. 07/02/20   Alexis Frock, MD  traMADol (ULTRAM) 50 MG tablet Take 50 mg by mouth every 8 (eight) hours as needed for moderate pain.  08/05/20    [provider]    Allergies    Patient has no known allergies.  Review of Systems   Review of Systems  Constitutional: Negative for chills and fever.  HENT: Negative for congestion.   Eyes: Negative for pain.  Respiratory: Negative for cough and shortness of breath.   Cardiovascular: Negative for chest pain and leg swelling.  Gastrointestinal: Positive for nausea. Negative for abdominal pain, diarrhea and vomiting.  Genitourinary: Negative for dysuria.  Musculoskeletal: Negative for myalgias.  Skin: Negative for rash.  Neurological: Negative for dizziness and headaches.    Physical Exam Updated Vital Signs BP 139/69 (BP Location: Right Arm)   Pulse 60   Temp 98.1 F (36.7 C) (Oral)   Resp 14   SpO2 100%   Physical Exam Vitals and nursing note reviewed.  Constitutional:      General: He is not in acute distress.    Appearance: He is not ill-appearing, toxic-appearing or diaphoretic.     Comments: Pleasant well-appearing 87 year old.  In no acute distress.  Sitting comfortably in bed.  Able answer questions appropriately follow commands. No increased work of breathing. Speaking in full sentences.  HENT:     Head: Normocephalic and atraumatic.     Nose: Nose normal.  Eyes:     General: No scleral icterus. Cardiovascular:     Rate and Rhythm: Normal rate and regular rhythm.     Pulses: Normal pulses.     Heart sounds: Normal heart sounds.  Pulmonary:     Effort: Pulmonary effort is normal. No respiratory distress.     Breath sounds: No wheezing.  Abdominal:     Palpations: Abdomen is soft.     Tenderness: There is no abdominal tenderness. There is no right CVA tenderness, left CVA tenderness, guarding or rebound.     Comments: Abdomen soft nontender  Musculoskeletal:     Cervical back: Normal range of motion.     Right lower leg: No edema.     Left lower leg: No edema.  Skin:    General: Skin is warm and dry.     Capillary Refill: Capillary refill takes  less than 2 seconds.  Neurological:     Mental Status: He is alert. Mental status is at baseline.     Comments: Alert and oriented to self, place, time and event.   Speech is fluent, clear without dysarthria or dysphasia.   Strength 5/5 in upper/lower extremities  Sensation intact in upper/lower extremities   Normal finger-to-nose and feet tapping.  CN I not tested  CN II grossly intact visual fields bilaterally. Did not visualize posterior eye.   CN III, IV, VI PERRLA and EOMs intact bilaterally  CN V Intact sensation to sharp and light touch to the face  CN VII facial movements symmetric  CN VIII not tested  CN IX, X no uvula deviation, symmetric  rise of soft palate  CN XI 5/5 SCM and trapezius strength bilaterally  CN XII Midline tongue protrusion, symmetric L/R movements   Psychiatric:        Mood and Affect: Mood normal.        Behavior: Behavior normal.     ED Results / Procedures / Treatments   Labs (all labs ordered are listed, but only abnormal results are displayed) Labs Reviewed  COMPREHENSIVE METABOLIC PANEL - Abnormal; Notable for the following components:      Result Value   Potassium 5.6 (*)    Glucose, Bld 100 (*)    BUN 28 (*)    Creatinine, Ser 2.06 (*)    Calcium 8.5 (*)    Albumin 2.9 (*)    GFR, Estimated 31 (*)    All other components within normal limits  CBC WITH DIFFERENTIAL/PLATELET - Abnormal; Notable for the following components:   RBC 3.86 (*)    Hemoglobin 10.7 (*)    HCT 33.7 (*)    All other components within normal limits  CBG MONITORING, ED - Abnormal; Notable for the following components:   Glucose-Capillary 111 (*)    All other components within normal limits  POTASSIUM  TROPONIN I (HIGH SENSITIVITY)    EKG EKG Interpretation  Date/Time:  Thursday Feb 20 2021 15:12:44 EDT Ventricular Rate:  64 PR Interval:  120 QRS Duration: 112 QT Interval:  427 QTC Calculation: 441 R Axis:   3 Text Interpretation: Sinus rhythm  Abnormal R-wave progression, early transition LVH with IVCD and secondary repol abnrm Borderline ST elevation, inferior leads TWI in the antero-lateral leads more pronounced, but don't appear to be new Confirmed by Varney Biles [phone]) on 02/20/2021 3:30:02 PM   Radiology DG Chest 2 View  Result Date: 02/20/2021 CLINICAL DATA:  Recent syncopal episode EXAM: CHEST - 2 VIEW COMPARISON:  08/16/2020 FINDINGS: Cardiac shadow is enlarged but stable. Aortic calcifications are noted. No sizable effusion is seen. No acute bony abnormality is noted. IMPRESSION: No acute abnormality noted. Electronically Signed   By: Inez Catalina M.D.   On: 02/20/2021 15:39    Procedures Procedures   Medications Ordered in ED Medications - No data to display  ED Course  I have reviewed the triage vital signs and the nursing notes.  Pertinent labs & imaging results that were available during my care of the patient were reviewed by me and considered in my medical decision making (see chart for details).  Patient is a 87 year old male with past medical history detailed in HPI presented today for episode of emesis.  Per son he seemed to be well-appearing beforehand and seems to have no symptoms after.  Patient is elderly and does seem to have some memory issues.  Will obtain basic lab work EKG, chest x-ray and reevaluate.  Anticipate discharge home.  Clinical Course as of 02/20/21 1916  Thu Feb 20, 2021  1700 Discussed with Wannetta Sender of cardiology who will arrange for OP zio patch  [WF]  1724 Potassium(!): 5.6 K+  elevated at 5.6.  Will have this rechecked [WF]    Clinical Course User Index [WF] Tedd Sias, PA   On recheck potassium was within normal limits at 5.1.  Patient's troponin is within normal limits.  CBC with no leukocytosis, chronic anemia unchanged.  CMP with kidney function within normal limits for patient.  EKG with some more pronounced T wave inversions than prior however these are in similar  locations.  No chest pain or shortness  of breath.  Reviewed by Dr. Kathrynn Humble and myself.  Chest x-ray 2 view obtained and without abnormalities.  MDM Rules/Calculators/A&P                          Patient was evaluated for near syncope and 1 episode of nonbloody nonbilious emesis.  He has had reassuring work-up here in the ER.  Will discharge home with return precautions.  He will follow-up with PCP early next week.  Sergio [name] was evaluated in Emergency Department on 02/20/2021 for the symptoms described in the history of present illness. He was evaluated in the context of the global COVID-19 pandemic, which necessitated consideration that the patient might be at risk for infection with the SARS-CoV-2 virus that causes COVID-19. Institutional protocols and algorithms that pertain to the evaluation of patients at risk for COVID-19 are in a state of rapid change based on information released by regulatory bodies including the CDC and federal and state organizations. These policies and algorithms were followed during the patient's care in the ED.   Final Clinical Impression(s) / ED Diagnoses Final diagnoses:  Near syncope  Non-intractable vomiting with nausea, unspecified vomiting type    Rx / DC Orders ED Discharge Orders    None       Tedd Sias, [place] 02/20/21 1918    Varney Biles, MD 02/21/21 1335

## 2021-02-20 NOTE — Discharge Instructions (Addendum)
Your work-up here was reassuring.  Your initial potassium level was very mildly high and I rechecked this level and found it to be normal.  I would like you to follow-up with your primary care doctor in the next 6 days to have your potassium levels checked.  Please return to the ER for any new or concerning symptoms.

## 2021-02-20 NOTE — ED Notes (Signed)
Received report from [place] at this time

## 2021-02-26 ENCOUNTER — Encounter: Payer: Self-pay | Admitting: *Deleted

## 2021-02-26 NOTE — Progress Notes (Signed)
Patient ID: Sergio [name], male   DOB: [birth year], 87 y.o.   MRN: [id number] Patient enrolled for [employer] to ship a 30 day cardiac event monitor to his home. Letter with instructions mailed to patient.

## 2021-03-18 DIAGNOSIS — R55 Syncope and collapse: Secondary | ICD-10-CM | POA: Insufficient documentation

## 2021-03-18 NOTE — Progress Notes (Deleted)
Cardiology Office Note   Date:  03/18/2021   ID:  Sergio [name], DOB [birth year], MRN [id number]  PCP:  Forrest Moron, MD  Cardiologist:   None Referring:  ***  No chief complaint on file.     History of Present Illness: Sergio [name] is a 87 y.o. male who presents for follow up of an ED visit for syncope and vomiting. I reviewed these records for this visit.    He was sent out of the ED.   A Zio patch was ordered.    In the ED I do see that he had acute on chronic renal insufficiency.  ***    Past Medical History:  Diagnosis Date   Colon polyps 06/22/2014   4 colon polyps by colonoscopy.  Repeat 3 years.  Hung.   Diabetes mellitus    Hypertension    Other and unspecified hyperlipidemia    PE (pulmonary embolism)     Past Surgical History:  Procedure Laterality Date   COLONOSCOPY W/ POLYPECTOMY  06/22/2014   4 colon polyps; repeat 3 years; [place].     Current Outpatient Medications  Medication Sig Dispense Refill   acetaminophen (TYLENOL) 500 MG tablet Take 500 mg by mouth every 6 (six) hours as needed for mild pain.      amLODipine (NORVASC) 10 MG tablet Take 1 tablet (10 mg total) by mouth daily. 30 tablet 1   apixaban (ELIQUIS) 5 MG TABS tablet Take 1 tablet (5 mg total) by mouth 2 (two) times daily. 60 tablet 2   hydrALAZINE (APRESOLINE) 25 MG tablet Take 1 tablet (25 mg total) by mouth 3 (three) times daily. 90 tablet 0   ondansetron (ZOFRAN) 4 MG tablet Take 1 tablet (4 mg total) by mouth every 8 (eight) hours as needed for nausea or vomiting. 15 tablet 0   tamsulosin (FLOMAX) 0.4 MG CAPS capsule Take 1 capsule (0.4 mg total) by mouth daily. 90 capsule 3   traMADol (ULTRAM) 50 MG tablet Take 50 mg by mouth every 8 (eight) hours as needed for moderate pain.      No current facility-administered medications for this visit.    Allergies:   Patient has no known allergies.    Social History:  The patient  reports that he quit smoking about 45 years ago. His  smoking use included cigars. He has never used smokeless tobacco. He reports that he does not drink alcohol and does not use drugs.   Family History:  The patient's ***family history includes Diabetes in an other family member.    ROS:  Please see the history of present illness.   Otherwise, review of systems are positive for {NONE DEFAULTED:18576}.   All other systems are reviewed and negative.    PHYSICAL EXAM: VS:  There were no vitals taken for this visit. , BMI There is no height or weight on file to calculate BMI. GENERAL:  Well appearing HEENT:  Pupils equal round and reactive, fundi not visualized, oral mucosa unremarkable NECK:  No jugular venous distention, waveform within normal limits, carotid upstroke brisk and symmetric, no bruits, no thyromegaly LYMPHATICS:  No cervical, inguinal adenopathy LUNGS:  Clear to auscultation bilaterally BACK:  No CVA tenderness CHEST:  Unremarkable HEART:  PMI not displaced or sustained,S1 and S2 within normal limits, no S3, no S4, no clicks, no rubs, *** murmurs ABD:  Flat, positive bowel sounds normal in frequency in pitch, no bruits, no rebound, no guarding, no midline pulsatile mass, no  hepatomegaly, no splenomegaly EXT:  2 plus pulses throughout, no edema, no cyanosis no clubbing SKIN:  No rashes no nodules NEURO:  Cranial nerves II through XII grossly intact, motor grossly intact throughout PSYCH:  Cognitively intact, oriented to person place and time    EKG:  EKG {ACTION; IS/IS ENI:[id number]} ordered today. The ekg ordered today demonstrates ***   Recent Labs: 08/16/2020: TSH 3.185 08/19/2020: Magnesium 2.3 02/20/2021: ALT 9; BUN 28; Creatinine, Ser 2.06; Hemoglobin 10.7; Platelets 226; Potassium 5.1; Sodium 139    Lipid Panel    Component Value Date/Time   CHOL 104 10/01/2017 0854   TRIG 109 10/01/2017 0854   HDL 41 10/01/2017 0854   CHOLHDL 2.5 10/01/2017 0854   CHOLHDL 3.2 07/04/2014 0948   VLDL 31 07/04/2014 0948    LDLCALC 41 10/01/2017 0854      Wt Readings from Last 3 Encounters:  08/19/20 131 lb 2.8 oz (59.5 kg)  07/03/20 142 lb 10.2 oz (64.7 kg)  05/28/20 135 lb (61.2 kg)      Other studies Reviewed: Additional studies/ records that were reviewed today include: ***. Review of the above records demonstrates:  Please see elsewhere in the note.  ***   ASSESSMENT AND PLAN:  Syncope:  ***  Renal insufficiency:  ***  HTN:  ***  DM:  ***     Current medicines are reviewed at length with the patient today.  The patient {ACTIONS; HAS/DOES NOT HAVE:19233} concerns regarding medicines.  The following changes have been made:  {PLAN; NO CHANGE:13088:s}  Labs/ tests ordered today include: *** No orders of the defined types were placed in this encounter.    Disposition:   FU with ***    Signed, Minus Breeding, MD  03/18/2021 6:12 PM    [facility]

## 2021-03-19 ENCOUNTER — Ambulatory Visit: Payer: PPO | Admitting: Cardiology

## 2021-03-19 DIAGNOSIS — R338 Other retention of urine: Secondary | ICD-10-CM | POA: Diagnosis not present

## 2021-03-19 DIAGNOSIS — R55 Syncope and collapse: Secondary | ICD-10-CM

## 2021-03-19 DIAGNOSIS — E118 Type 2 diabetes mellitus with unspecified complications: Secondary | ICD-10-CM

## 2021-03-19 DIAGNOSIS — I10 Essential (primary) hypertension: Secondary | ICD-10-CM

## 2021-04-11 ENCOUNTER — Ambulatory Visit (HOSPITAL_BASED_OUTPATIENT_CLINIC_OR_DEPARTMENT_OTHER): Payer: PPO | Admitting: Anesthesiology

## 2021-04-11 ENCOUNTER — Other Ambulatory Visit: Payer: Self-pay | Admitting: Urology

## 2021-04-11 ENCOUNTER — Ambulatory Visit (HOSPITAL_BASED_OUTPATIENT_CLINIC_OR_DEPARTMENT_OTHER)
Admission: RE | Admit: 2021-04-11 | Discharge: 2021-04-11 | Disposition: A | Discharge status: Home / Self Care | Payer: PPO | Source: Ambulatory Visit | Attending: Urology | Admitting: Urology

## 2021-04-11 ENCOUNTER — Encounter (HOSPITAL_BASED_OUTPATIENT_CLINIC_OR_DEPARTMENT_OTHER): Payer: Self-pay | Admitting: Urology

## 2021-04-11 ENCOUNTER — Encounter (HOSPITAL_BASED_OUTPATIENT_CLINIC_OR_DEPARTMENT_OTHER)
Admission: RE | Disposition: A | Discharge status: Home / Self Care | Payer: Self-pay | Source: Ambulatory Visit | Attending: Urology

## 2021-04-11 DIAGNOSIS — N1330 Unspecified hydronephrosis: Secondary | ICD-10-CM | POA: Diagnosis not present

## 2021-04-11 DIAGNOSIS — N3946 Mixed incontinence: Secondary | ICD-10-CM | POA: Insufficient documentation

## 2021-04-11 DIAGNOSIS — Z79899 Other long term (current) drug therapy: Secondary | ICD-10-CM | POA: Diagnosis not present

## 2021-04-11 DIAGNOSIS — K8020 Calculus of gallbladder without cholecystitis without obstruction: Secondary | ICD-10-CM | POA: Insufficient documentation

## 2021-04-11 DIAGNOSIS — N1339 Other hydronephrosis: Secondary | ICD-10-CM | POA: Diagnosis not present

## 2021-04-11 DIAGNOSIS — Z7984 Long term (current) use of oral hypoglycemic drugs: Secondary | ICD-10-CM | POA: Diagnosis not present

## 2021-04-11 DIAGNOSIS — M109 Gout, unspecified: Secondary | ICD-10-CM | POA: Diagnosis not present

## 2021-04-11 DIAGNOSIS — Z7901 Long term (current) use of anticoagulants: Secondary | ICD-10-CM | POA: Diagnosis not present

## 2021-04-11 DIAGNOSIS — E119 Type 2 diabetes mellitus without complications: Secondary | ICD-10-CM | POA: Insufficient documentation

## 2021-04-11 DIAGNOSIS — N35812 Other urethral bulbous stricture, male: Secondary | ICD-10-CM | POA: Diagnosis not present

## 2021-04-11 DIAGNOSIS — R338 Other retention of urine: Secondary | ICD-10-CM | POA: Diagnosis not present

## 2021-04-11 DIAGNOSIS — Z87891 Personal history of nicotine dependence: Secondary | ICD-10-CM | POA: Insufficient documentation

## 2021-04-11 DIAGNOSIS — N281 Cyst of kidney, acquired: Secondary | ICD-10-CM | POA: Diagnosis not present

## 2021-04-11 DIAGNOSIS — N35912 Unspecified bulbous urethral stricture, male: Secondary | ICD-10-CM | POA: Diagnosis not present

## 2021-04-11 DIAGNOSIS — N179 Acute kidney failure, unspecified: Secondary | ICD-10-CM | POA: Diagnosis not present

## 2021-04-11 DIAGNOSIS — I10 Essential (primary) hypertension: Secondary | ICD-10-CM | POA: Diagnosis not present

## 2021-04-11 DIAGNOSIS — N35013 Post-traumatic anterior urethral stricture: Secondary | ICD-10-CM | POA: Diagnosis not present

## 2021-04-11 DIAGNOSIS — N130 Hydronephrosis with ureteropelvic junction obstruction: Secondary | ICD-10-CM | POA: Diagnosis not present

## 2021-04-11 HISTORY — PX: CYSTOSCOPY WITH URETHRAL DILATATION: SHX5125

## 2021-04-11 LAB — BASIC METABOLIC PANEL
Anion gap: 5 (ref 5–15)
BUN: 30 mg/dL — ABNORMAL HIGH (ref 8–23)
CO2: 26 mmol/L (ref 22–32)
Calcium: 9 mg/dL (ref 8.9–10.3)
Chloride: 113 mmol/L — ABNORMAL HIGH (ref 98–111)
Creatinine, Ser: 1.62 mg/dL — ABNORMAL HIGH (ref 0.61–1.24)
GFR, Estimated: 41 mL/min — ABNORMAL LOW (ref >60)
Glucose, Bld: 90 mg/dL (ref 70–99)
Potassium: 4.7 mmol/L (ref 3.5–5.1)
Sodium: 144 mmol/L (ref 135–145)

## 2021-04-11 LAB — CBC
HCT: 34.6 % — ABNORMAL LOW (ref 39.0–52.0)
Hemoglobin: 10.9 g/dL — ABNORMAL LOW (ref 13.0–17.0)
MCH: 27.7 pg (ref 26.0–34.0)
MCHC: 31.5 g/dL (ref 30.0–36.0)
MCV: 87.8 fL (ref 80.0–100.0)
Platelets: 240 10*3/uL (ref 150–400)
RBC: 3.94 MIL/uL — ABNORMAL LOW (ref 4.22–5.81)
RDW: 15 % (ref 11.5–15.5)
WBC: 8.2 10*3/uL (ref 4.0–10.5)
nRBC: 0 % (ref 0.0–0.2)

## 2021-04-11 SURGERY — CYSTOSCOPY, WITH URETHRAL DILATION
Anesthesia: General | Site: Urethra

## 2021-04-11 MED ORDER — SODIUM CHLORIDE 0.9 % IV SOLN
INTRAVENOUS | Status: AC
  Filled 2021-04-11: qty 2

## 2021-04-11 MED ORDER — DEXAMETHASONE SODIUM PHOSPHATE 10 MG/ML IJ SOLN
INTRAMUSCULAR | Status: DC | PRN
  Administered 2021-04-11: 10 mg via INTRAVENOUS

## 2021-04-11 MED ORDER — ONDANSETRON HCL 4 MG/2ML IJ SOLN
INTRAMUSCULAR | Status: DC | PRN
  Administered 2021-04-11: 4 mg via INTRAVENOUS

## 2021-04-11 MED ORDER — FENTANYL CITRATE (PF) 100 MCG/2ML IJ SOLN: 25.0000 ug | INTRAMUSCULAR | Status: DC | PRN

## 2021-04-11 MED ORDER — SODIUM CHLORIDE 0.9 % IV SOLN
2.0000 g | INTRAVENOUS | Status: AC
  Administered 2021-04-11: 2 g via INTRAVENOUS

## 2021-04-11 MED ORDER — OXYCODONE HCL 5 MG/5ML PO SOLN: 5.0000 mg | Freq: Once | ORAL | Status: DC | PRN

## 2021-04-11 MED ORDER — PROPOFOL 10 MG/ML IV BOLUS
INTRAVENOUS | Status: DC | PRN
  Administered 2021-04-11: 100 mg via INTRAVENOUS

## 2021-04-11 MED ORDER — PROMETHAZINE HCL 25 MG/ML IJ SOLN: 6.2500 mg | INTRAMUSCULAR | Status: DC | PRN

## 2021-04-11 MED ORDER — LIDOCAINE HCL (PF) 2 % IJ SOLN
INTRAMUSCULAR | Status: AC
  Filled 2021-04-11: qty 5

## 2021-04-11 MED ORDER — CEPHALEXIN 250 MG PO CAPS: 250.0000 mg | ORAL_CAPSULE | Freq: Every day | ORAL | 0 refills | Status: AC

## 2021-04-11 MED ORDER — LACTATED RINGERS IV SOLN: INTRAVENOUS | Status: DC

## 2021-04-11 MED ORDER — OXYCODONE HCL 5 MG PO TABS: 5.0000 mg | ORAL_TABLET | Freq: Once | ORAL | Status: DC | PRN

## 2021-04-11 MED ORDER — EPHEDRINE SULFATE-NACL 50-0.9 MG/10ML-% IV SOSY
PREFILLED_SYRINGE | INTRAVENOUS | Status: DC | PRN
  Administered 2021-04-11 (×2): 10 mg via INTRAVENOUS

## 2021-04-11 MED ORDER — ACETAMINOPHEN 500 MG PO TABS
ORAL_TABLET | ORAL | Status: AC
  Filled 2021-04-11: qty 2

## 2021-04-11 MED ORDER — PROPOFOL 10 MG/ML IV BOLUS
INTRAVENOUS | Status: AC
  Filled 2021-04-11: qty 20

## 2021-04-11 MED ORDER — FENTANYL CITRATE (PF) 100 MCG/2ML IJ SOLN
INTRAMUSCULAR | Status: DC | PRN
  Administered 2021-04-11: 25 ug via INTRAVENOUS

## 2021-04-11 MED ORDER — LIDOCAINE 2% (20 MG/ML) 5 ML SYRINGE
INTRAMUSCULAR | Status: DC | PRN
  Administered 2021-04-11: 60 mg via INTRAVENOUS

## 2021-04-11 MED ORDER — ACETAMINOPHEN 500 MG PO TABS
1000.0000 mg | ORAL_TABLET | Freq: Once | ORAL | Status: AC
  Administered 2021-04-11: 1000 mg via ORAL

## 2021-04-11 MED ORDER — EPHEDRINE 5 MG/ML INJ
INTRAVENOUS | Status: AC
  Filled 2021-04-11: qty 10

## 2021-04-11 MED ORDER — FENTANYL CITRATE (PF) 100 MCG/2ML IJ SOLN
INTRAMUSCULAR | Status: AC
  Filled 2021-04-11: qty 2

## 2021-04-11 MED ORDER — SODIUM CHLORIDE 0.9 % IR SOLN
Status: DC | PRN
  Administered 2021-04-11: 800 mL

## 2021-04-11 MED ORDER — ARTIFICIAL TEARS OPHTHALMIC OINT
TOPICAL_OINTMENT | OPHTHALMIC | Status: AC
  Filled 2021-04-11: qty 3.5

## 2021-04-11 SURGICAL SUPPLY — 21 items
BAG DRAIN URO-CYSTO SKYTR STRL (DRAIN) ×2 IMPLANT
BAG DRN RND TRDRP ANRFLXCHMBR (UROLOGICAL SUPPLIES) ×1
BAG DRN UROCATH (DRAIN) ×1
BAG URINE DRAIN 2000ML AR STRL (UROLOGICAL SUPPLIES) ×1 IMPLANT
BALLN NEPHROSTOMY (BALLOONS) ×2
BALLOON NEPHROSTOMY (BALLOONS) ×1 IMPLANT
CATH FOLEY 2W COUNCIL 5CC 18FR (CATHETERS) ×1 IMPLANT
CLOTH BEACON ORANGE TIMEOUT ST (SAFETY) ×2 IMPLANT
ELECT REM PT RETURN 9FT ADLT (ELECTROSURGICAL)
ELECTRODE REM PT RTRN 9FT ADLT (ELECTROSURGICAL) ×1 IMPLANT
GLOVE SURG ENC MOIS LTX SZ6.5 (GLOVE) ×2 IMPLANT
GOWN STRL REUS W/TWL LRG LVL3 (GOWN DISPOSABLE) ×2 IMPLANT
HOLDER FOLEY CATH W/STRAP (MISCELLANEOUS) ×1 IMPLANT
IV NS IRRIG 3000ML ARTHROMATIC (IV SOLUTION) ×1 IMPLANT
KIT TURNOVER CYSTO (KITS) ×2 IMPLANT
MANIFOLD NEPTUNE II (INSTRUMENTS) ×2 IMPLANT
PACK CYSTO (CUSTOM PROCEDURE TRAY) ×2 IMPLANT
TUBE CONNECTING 12X1/4 (SUCTIONS) ×2 IMPLANT
TUBING UROLOGY SET (TUBING) ×2 IMPLANT
WATER STERILE IRR 3000ML UROMA (IV SOLUTION) ×1 IMPLANT
WATER STERILE IRR 500ML POUR (IV SOLUTION) ×1 IMPLANT

## 2021-04-11 NOTE — Interval H&P Note (Signed)
History and Physical Interval Note:  04/11/2021 2:04 PM  Sergio [name]  has presented today for surgery, with the diagnosis of URETHRAL STRICTURE, URINARY RETENTION.  The various methods of treatment have been discussed with the patient and family. After consideration of risks, benefits and other options for treatment, the patient has consented to  Procedure(s) with comments: [place] (N/A) - 45 MINS as a surgical intervention.  The patient's history has been reviewed, patient examined, no change in status, stable for surgery.  I have reviewed the patient's chart and labs.  Questions were answered to the patient's satisfaction.     Carder Yin D Rosalene Wardrop

## 2021-04-11 NOTE — Transfer of Care (Signed)
Immediate Anesthesia Transfer of Care Note  Patient: Sergio [name]  Procedure(s) Performed: CYSTOSCOPY WITH URETHRAL  BALLOON DILATATION (Urethra)  Patient Location: PACU  Anesthesia Type:General  Level of Consciousness: drowsy, patient cooperative and responds to stimulation  Airway & Oxygen Therapy: Patient Spontanous Breathing  Post-op Assessment: Report given to RN and Post -op Vital signs reviewed and stable  Post vital signs: Reviewed and stable  Last Vitals:  Vitals Value Taken Time  BP    Temp    Pulse 85 04/11/21 1444  Resp 13 04/11/21 1445  SpO2 99 % 04/11/21 1444  Vitals shown include unvalidated device data.  Last Pain:  Vitals:   04/11/21 1320  TempSrc: Oral  PainSc: 0-No pain      Patients Stated Pain Goal: 4 ([birth year] 1224)  Complications: No notable events documented.

## 2021-04-11 NOTE — Anesthesia Postprocedure Evaluation (Signed)
Anesthesia Post Note  Patient: Sergio [name]  Procedure(s) Performed: CYSTOSCOPY WITH URETHRAL  BALLOON DILATATION (Urethra)     Patient location during evaluation: PACU Anesthesia Type: General Level of consciousness: awake and alert Pain management: pain level controlled Vital Signs Assessment: post-procedure vital signs reviewed and stable Respiratory status: spontaneous breathing, nonlabored ventilation and respiratory function stable Cardiovascular status: stable and blood pressure returned to baseline Anesthetic complications: no   No notable events documented.  Last Vitals:  Vitals:   04/11/21 1445 04/11/21 1500  BP: (!) 166/73 (!) 177/80  Pulse: 85 74  Resp: 16 12  Temp:    SpO2: 99% 100%    Last Pain:  Vitals:   04/11/21 1445  TempSrc:   PainSc: 0-No pain                 Audry Pili

## 2021-04-11 NOTE — Anesthesia Procedure Notes (Signed)
Procedure Name: LMA Insertion Date/Time: 04/11/2021 2:22 PM Performed by: Rogers Blocker, CRNA Pre-anesthesia Checklist: Patient identified, Emergency Drugs available, Suction available and Patient being monitored Patient Re-evaluated:Patient Re-evaluated prior to induction Oxygen Delivery Method: Circle System Utilized Preoxygenation: Pre-oxygenation with 100% oxygen Induction Type: IV induction Ventilation: Mask ventilation without difficulty LMA: LMA inserted LMA Size: 4.0 Number of attempts: 1 Placement Confirmation: positive ETCO2 Tube secured with: Tape Dental Injury: Teeth and Oropharynx as per pre-operative assessment

## 2021-04-11 NOTE — Anesthesia Preprocedure Evaluation (Addendum)
Anesthesia Evaluation  Patient identified by MRN, date of birth, ID band Patient awake    Reviewed: Allergy & Precautions, NPO status , Patient's Chart, lab work & pertinent test results  History of Anesthesia Complications Negative for: history of anesthetic complications  Airway Mallampati: II  TM Distance: >3 FB Neck ROM: Full    Dental  (+) Missing, Poor Dentition, Edentulous Upper,    Pulmonary former smoker, PE ("years ago", on Eliquis (last dose yesterday))   Pulmonary exam normal        Cardiovascular hypertension, Pt. on medications Normal cardiovascular exam     Neuro/Psych negative neurological ROS  negative psych ROS   GI/Hepatic negative GI ROS, Neg liver ROS,   Endo/Other  diabetes, Type 2  Renal/GU negative Renal ROSURETHRAL STRICTURE, URINARY RETENTION  negative genitourinary   Musculoskeletal negative musculoskeletal ROS (+)   Abdominal   Peds  Hematology negative hematology ROS (+)   Anesthesia Other Findings Day of surgery medications reviewed with patient.  Reproductive/Obstetrics negative OB ROS                            Anesthesia Physical Anesthesia Plan  ASA: 3  Anesthesia Plan: General   Post-op Pain Management:    Induction: Intravenous  PONV Risk Score and Plan: 2 and Treatment may vary due to age or medical condition, Dexamethasone and Ondansetron  Airway Management Planned: LMA  Additional Equipment: None  Intra-op Plan:   Post-operative Plan: Extubation in OR  Informed Consent: I have reviewed the patients History and Physical, chart, labs and discussed the procedure including the risks, benefits and alternatives for the proposed anesthesia with the patient or authorized representative who has indicated his/her understanding and acceptance.     Dental advisory given  Plan Discussed with: CRNA  Anesthesia Plan Comments: (Ate cereal at  0600, will proceed at or after 1400. Daiva Huge, MD)       Anesthesia Quick Evaluation

## 2021-04-11 NOTE — Op Note (Signed)
Operative Note  Preoperative diagnosis:  1.  Bulbar urethral stricture  Postoperative diagnosis: 1.  Bulbar urethral stricture  Procedure(s): 1.  Cystourethroscopy 2.  Urethral balloon dilation 3.  18Fr council tip foley over wire  Surgeon: Jacalyn Lefevre, MD  Assistants:  None  Anesthesia:  General  Complications:  None  EBL:  minimal  Specimens: 1. none  Drains/Catheters: 1.  18Fr council tip foley  Intraoperative findings:   Dense, pin point bulbar urethral stricutre - barely able to accomodate 0.38 sensor wire with gentle force  Indication:  Sergio [name] is a 87 y.o. male with history of urinary retention who presented to the office today found to have >400cc urine in bladder with hydronephrosis.  Cystoscopy showed dense bulbar urethral stricture.   Description of procedure:  After risks and benefits of above procedure were discussed with the patient, informed consent was obtained.  The patient was taken to the OR and anesthesia was induced.  Antibiotics were administered.  The patient was prepped and draped in usual sterile fashion and a time out was performed.  A 23Fr rigid cystoscope was placed in urethral meatus.  As the scope was advanced into the urethra a dense bulbar urethral stricture was seen.  There was a pink point opening.  A 0.38 sensor wire was then advanced through the opening.  Resistance was met but with gentle pressure the wire was advanced into the bladder.  Next the ultraxx balloon dilator was placed over the wire and fluoroscopy was used to ensure the proximal marker was into bladder.  It was inflated with saline to67mg Hg. Following dilation the saline was removed from ballon and dilator removed from urethra taking care to keep wire in place.  An 18Fr council tip foley was then placed over the wire and advanced into the bladder.  The wire was removed and the foley inflated with 10 cc sterile water.    The patient was awakened from anesthesia and  transferred to PACU in stable condition.  Plan:   Patient was given antibiotic prophylaxis and will continue foley catheter for 1 week.

## 2021-04-11 NOTE — H&P (Signed)
1 - Bilateral Hydronephrosis / Obstructive Uropathy - bilateral moderate hydro to distended bladder by [place] and then CT 06/2020 on eval acute renal failure / urinary retention. Foley placed and started on finasteride + tamsulosin.     2 - Urinary Retention / Enlarged Prostate - Prostate volume 3mL without median lobe by CT ellipsoid calculation 06/2020. Started on tamsulosin + finasteride 06/2020   08/01/20 - failed voiding trial  09/05/20 - failed voiding trial  10/2020 - passed voiding rial.      3 - Acute on Chronic Renal Failure - Baseline Cr 1.5's with rise to 10 by ER labs 06/2020 in setting of urinary retention. Back to baseline <1.5 after catheter / hydration     4 - Prostate Screening - PSA 7.4 2018 at age 87 (age adjusted normal).     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 Does not get regular primary care.      03/19/2021: Per his urologist, the patient demonstrated appropriate voiding trial at time of last office visit. Now back today for repeat exam continuing tamsulosin and finasteride, he is here with his son. The patient denies specific voiding complaints today. States force of stream is adequate without bothersome daytime frequency/urgency, nocturia 1-2 times nightly. No interval dysuria or gross hematuria. No interval treatment for UTI. His son pulled me aside and told me that his father has actually not been voiding on his own. He has been wearing a depends and voiding into that. He tells me he is not making any effort to attempt voiding on his own. He has required frequent daily adult diaper changes due to the ongoing incontinence. It is difficult to say if this is actually overflow urge incontinence or if this is a behavioral issue verses sensory unaware incontinence in the setting of a neurogenic bladder. PVR today is somewhat concerning for recurrence of urinary retention with around 450 mL noted on bladder scan.    04/11/2021: Lengthy discussion with the patient with family member present occurred at time of last office visit. Patient had been voiding into a diaper and not making any effort at voiding on his own. Bladder residual suggestive of retention recurrence but I wanted patient to try attempted voiding on a schedule while continuing twice daily tamsulosin before considering catheter replacement. Now back today for follow-up renal ultrasound to assess true bladder residual as well as for any upstream obstructive signs.   Again today the patient denies any significant bothersome voiding complaints but per his son he continues to void mostly into a diaper requiring multiple changes throughout the day. No changes in baseline mental status, complaints of burning or painful urination, gross hematuria. He remains afebrile without nausea or vomiting.     ALLERGIES: None   MEDICATIONS: Amlodipine Besylate 10 mg tablet TAKE 1 TABLET BY MOUTH EVERY DAY  Amlodipine Besylate 10 mg tablet TAKE 1 TABLET BY MOUTH EVERY DAY  Finasteride 5 mg tablet 1 tablet PO Daily  Tamsulosin Hcl 0.4 mg capsule 1 capsule PO BID  Amlodipine Besylate 10 mg tablet  Eliquis 5 mg tablet  Hydralazine Hcl 25 mg tablet  Metformin Er Gastric 500 mg tablet, er gastric retention 24 hr  Ondansetron Hcl 4 mg tablet     GU PSH: None   NON-GU PSH: None   GU PMH: Mixed incontinence - 03/19/2021 Urinary Retention - 03/19/2021, - 11/26/2020, - 10/21/2020, - 09/05/2020, - 08/01/2020 Acute kidney failure -  11/26/2020, - 10/21/2020, - 09/05/2020, - 08/01/2020      PMH Notes: His last PSA was performed 03/24/2017. The last PSA value was 7.4. He has not had a prostate biopsy. The patient states he does not take 5 alpha reductase inhibitor medication.      NON-GU PMH: Diabetes Type 2 Gout    FAMILY HISTORY: None   SOCIAL HISTORY: Marital Status: Widowed Preferred Language: English; Ethnicity: Not Hispanic Or Latino; Race: Black or African  American Current Smoking Status: Patient has never smoked.   Tobacco Use Assessment Completed: Used Tobacco in last 30 days? Does not drink anymore.  Does not drink caffeine.    REVIEW OF SYSTEMS:    GU Review Male:   Patient reports frequent urination, get up at night to urinate, and leakage of urine. Patient denies hard to postpone urination, burning/ pain with urination, stream starts and stops, trouble starting your stream, have to strain to urinate , erection problems, and penile pain.  Gastrointestinal (Upper):   Patient denies nausea, vomiting, and indigestion/ heartburn.  Gastrointestinal (Lower):   Patient denies diarrhea and constipation.  Constitutional:   Patient denies fever, night sweats, weight loss, and fatigue.  Skin:   Patient denies skin rash/ lesion and itching.  Eyes:   Patient denies blurred vision and double vision.  Ears/ Nose/ Throat:   Patient denies sore throat and sinus problems.  Hematologic/Lymphatic:   Patient denies swollen glands and easy bruising.  Cardiovascular:   Patient denies leg swelling and chest pains.  Respiratory:   Patient denies cough and shortness of breath.  Endocrine:   Patient denies excessive thirst.  Musculoskeletal:   Patient reports joint pain. Patient denies back pain.  Neurological:   Patient denies headaches and dizziness.  Psychologic:   Patient denies depression and anxiety.   Notes: Updated from previous visit 08/01/2020 with review from patient as noted above.   VITAL SIGNS:      04/11/2021 10:59 AM  BP 154/68 mmHg  Pulse 87 /min   GU PHYSICAL EXAMINATION:      Notes: His depends is moderately wet with urine, no odor.   MULTI-SYSTEM PHYSICAL EXAMINATION:    Constitutional: Well-nourished. No physical deformities. Normally developed. Good grooming.  Neck: Neck symmetrical, not swollen. Normal tracheal position.  Respiratory: No labored breathing, no use of accessory muscles.   Cardiovascular: Normal temperature, normal  extremity pulses, no swelling, no varicosities.  Skin: No paleness, no jaundice, no cyanosis. No lesion, no ulcer, no rash.  Neurologic / Psychiatric: Oriented to time, oriented to place, oriented to person. No depression, no anxiety, no agitation.  Gastrointestinal: No mass, no tenderness, no rigidity, non obese abdomen.  Musculoskeletal: Normal gait and station of head and neck.     Complexity of Data:  Source Of History:  Patient, Family/Caregiver, Medical Record Summary  Records Review:   Previous Doctor Records, Previous Hospital Records, Previous Patient Records  Urine Test Review:   Urinalysis  X-Ray Review: Renal Ultrasound: Reviewed Films. Discussed With Patient.     04/11/21  Urinalysis  Urine Appearance Cloudy   Urine Color Yellow   Urine Glucose Neg mg/dL  Urine Bilirubin Neg mg/dL  Urine Ketones Neg mg/dL  Urine Specific Gravity 1.025   Urine Blood Neg ery/uL  Urine pH 6.0   Urine Protein 1+ mg/dL  Urine Urobilinogen 0.2 mg/dL  Urine Nitrites Neg   Urine Leukocyte Esterase 3+ leu/uL  Urine WBC/hpf >60/hpf   Urine RBC/hpf NS (Not Seen)   Urine Epithelial Cells  0 - 5/hpf   Urine Bacteria Few (10-25/hpf)   Urine Mucous Not Present   Urine Yeast NS (Not Seen)   Urine Trichomonas Not Present   Urine Cystals NS (Not Seen)   Urine Casts NS (Not Seen)   Urine Sperm Not Present    PROCEDURES:         Flexible Cystoscopy - 52000  Risks, benefits, and some of the potential complications of the procedure were discussed at length with the patient including infection, bleeding, voiding discomfort, urinary retention, fever, chills, sepsis, and others. All questions were answered. Informed consent was obtained. Antibiotic prophylaxis was given. Sterile technique and intraurethral analgesia were used.  Meatus:  Normal size. Normal location. Normal condition.  Urethra:  Severe penile stricture along the anterior urethra. The prostate could not be visualized due to this.       The lower urinary tract was carefully examined. The procedure was well-tolerated and without complications. Antibiotic instructions were given. Instructions were given to call the office immediately for bloody urine, difficulty urinating, urinary retention, painful or frequent urination, fever, chills, nausea, vomiting or other illness. The patient stated that he understood these instructions and would comply with them.          Renal Ultrasound - [id number]  Right Kidney: Length:9.4 cm Depth:5.0 cm Cortical Width:1.6 cm Width:4.7 cm  Left Kidney: Length:10.0 cm Depth:4.7 cm Cortical Width: 1.6 cm Width:4.9 cm  Left Kidney/Ureter:  Hydro noted, Multiple cystic areas, largest= 0.77cm MP.  Right Kidney/Ureter:  Cystic area MP= 0.73cm  Bladder:  PVR= 357.80ML. ? Bladder diverticuli noted.      Estimated prostate size= 44.52ML. ? Multiple stones seen in the GB vs one large one, ? Sludge noted within the GB.  Increased bowel gas.  Patient confirmed No Neulasta OnPro Device.           Urinalysis w/Scope Dipstick Dipstick Cont'd Micro  Color: Yellow Bilirubin: Neg mg/dL WBC/hpf: >60/hpf  Appearance: Cloudy Ketones: Neg mg/dL RBC/hpf: NS (Not Seen)  Specific Gravity: 1.025 Blood: Neg ery/uL Bacteria: Few (10-25/hpf)  pH: 6.0 Protein: 1+ mg/dL Cystals: NS (Not Seen)  Glucose: Neg mg/dL Urobilinogen: 0.2 mg/dL Casts: NS (Not Seen)    Nitrites: Neg Trichomonas: Not Present    Leukocyte Esterase: 3+ leu/uL Mucous: Not Present      Epithelial Cells: 0 - 5/hpf      Yeast: NS (Not Seen)      Sperm: Not Present    Notes: tubular renal epi's present    ASSESSMENT:      ICD-10 Details  1 GU:   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 He has hydronephrosis on the left side. Incidental findings also include bilateral renal cyst but they are grossly simple  appearing; gallstone he is not having any abdominal pain or discomfort to correlate with possible cholecystitis. No problems with nausea/vomiting or indigestion.  3   Mixed incontinence - N39.46 Chronic, Stable  4   Hydronephrosis - N13.0 Left, Undiagnosed New Problem  5   Obstructive and reflux uropathy, Unspec - N13.9 Undiagnosed New Problem   PLAN:           Orders Labs Urine Culture          Schedule Return Visit/Planned Activity: ASAP - Schedule Surgery  Return Visit/Planned Activity: 1-2 Weeks - Follow up MD, Office Visit- No Urine  Note: Going to the OR this evening, needs f/u with Dr Tresa Moore in the next 1-2 weeks.          Document Letter(s):  Created for Patient: Clinical Summary         Notes:   Left-sided hydro noted with continued elevated residual indicative of urinary retention. Unsuccessful catheter placement with myself and nursing staff. On-call urologist brought to bedside were cystoscopy was attempted which revealed a fairly dense anterior urethral stricture. Dr Claudia Desanctis recommended proceeding to OR tonight to undergo cystoscopy under anesthesia with attempted dilation of the newly diagnosed stricture with Foley catheter placement. Risks versus benefits as well as expected postoperative course explained in detail to the patient and his present family member today. All questions answered to the best of my ability about the procedure and expected postoperative course with understanding expressed by the patient and his family member. He will f/u post-operatively with Dr Tresa Moore.

## 2021-04-11 NOTE — Discharge Instructions (Addendum)
Post Bladder Surgery Instructions   General instructions:    Your recent bladder surgery requires very little post hospital care but some definite precautions.  You may see some blood in your urine over the first 6 weeks. Do not be alarmed, even if the urine was clear for a while. Get off your feet and drink lots of fluids until clearing occurs. If you start to pass clots or don't improve call Sergio [name].  Catheter: (If you are discharged with a catheter.)  1. Keep your catheter secured to your leg at all times with tape or the supplied strap. 2. You may experience leakage of urine around your catheter- as long as the  catheter continues to drain, this is normal.  If your catheter stops draining  go to the ER. 3. You may also have blood in your urine, even after it has been clear for  several days; you may even pass some small blood clots or other material.  This  is normal as well.  If this happens, sit down and drink plenty of water to help  make urine to flush out your bladder.  If the blood in your urine becomes worse  after doing this, contact our office or return to the ER. 4. You may use the leg bag (small bag) during the day, but use the large bag at  night.  Diet:  You may return to your normal diet immediately. Because of the raw surface of your bladder, alcohol, spicy foods, foods high in acid and drinks with caffeine may cause irritation or frequency and should be used in moderation. To keep your urine flowing freely and avoid constipation, drink plenty of fluids during the day (8-10 glasses). Tip: Avoid cranberry juice because it is very acidic.  Activity:  Your physical activity doesn't need to be restricted. However, if you are very active, you may see some blood in the urine. We suggest that you reduce your activity under the circumstances until the bleeding has stopped.  Bowels:  It is important to keep your bowels regular during the postoperative period. Straining with  bowel movements can cause bleeding. A bowel movement every other day is reasonable. Use a mild laxative if needed, such as milk of magnesia 2-3 tablespoons, or 2 Dulcolax tablets. Call if you continue to have problems. If you had been taking narcotics for pain, before, during or after your surgery, you may be constipated. Take a laxative if necessary.   Medication:  You should resume your pre-surgery medications unless told not to. In addition you may be given an antibiotic to prevent or treat infection. Antibiotics are not always necessary. All medication should be taken as prescribed until the bottles are finished unless you are having an unusual reaction to one of the drugs.     Post Anesthesia Home Care Instructions  Activity: Get plenty of rest for the remainder of the day. A responsible individual must stay with you for 24 hours following the procedure.  For the next 24 hours, DO NOT: -Drive a car -Paediatric nurse -Drink alcoholic beverages -Take any medication unless instructed by your physician -Make any legal decisions or sign important papers.  Meals: Start with liquid foods such as gelatin or soup. Progress to regular foods as tolerated. Avoid greasy, spicy, heavy foods. If nausea and/or vomiting occur, drink only clear liquids until the nausea and/or vomiting subsides. Call your physician if vomiting continues.  Special Instructions/Symptoms: Your throat may feel dry or sore from the anesthesia or the  breathing tube placed in your throat during surgery. If this causes discomfort, gargle with warm salt water. The discomfort should disappear within 24 hours.

## 2021-04-14 ENCOUNTER — Encounter (HOSPITAL_BASED_OUTPATIENT_CLINIC_OR_DEPARTMENT_OTHER): Payer: Self-pay | Admitting: Urology

## 2021-05-06 DIAGNOSIS — N35013 Post-traumatic anterior urethral stricture: Secondary | ICD-10-CM | POA: Diagnosis not present

## 2021-05-06 DIAGNOSIS — R338 Other retention of urine: Secondary | ICD-10-CM | POA: Diagnosis not present

## 2021-05-06 DIAGNOSIS — N130 Hydronephrosis with ureteropelvic junction obstruction: Secondary | ICD-10-CM | POA: Diagnosis not present

## 2021-08-11 DIAGNOSIS — R338 Other retention of urine: Secondary | ICD-10-CM | POA: Diagnosis not present

## 2021-08-11 DIAGNOSIS — N35013 Post-traumatic anterior urethral stricture: Secondary | ICD-10-CM | POA: Diagnosis not present

## 2021-08-11 DIAGNOSIS — N130 Hydronephrosis with ureteropelvic junction obstruction: Secondary | ICD-10-CM | POA: Diagnosis not present

## 2021-12-12 IMAGING — CT CT HEAD W/O CM
4 series · 16 of 47 positions shown, 18 images · non-contrast
Comparison: July 05, 2020

CLINICAL DATA: Altered mental status.

EXAM:
CT HEAD WITHOUT CONTRAST
TECHNIQUE: Contiguous axial images were obtained from the base of the skull
through the vertex without intravenous contrast.

[Series 3: head without · axial · non-contrast · 0.47mm/px · z∈[+1325,+1445]mm · 7 of 33 slices shown, 9 images]
[im 5/33  brain]
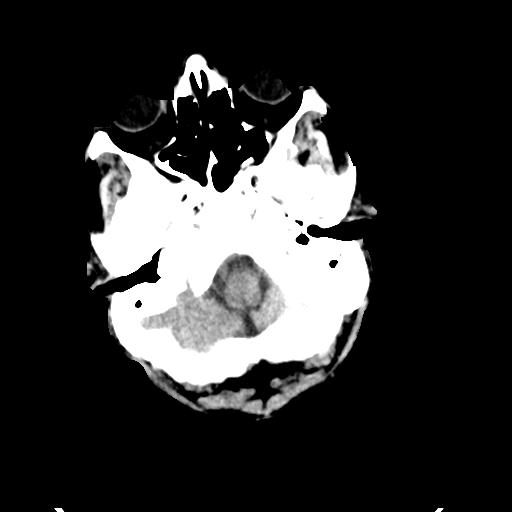
[im 5/33  bone]
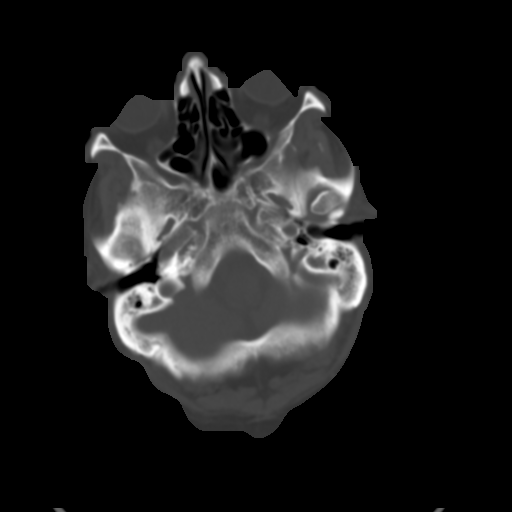
[im 9/33  brain]
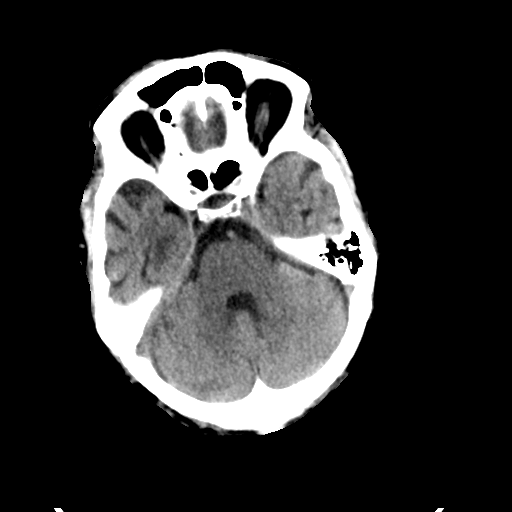
[im 13/33  brain]
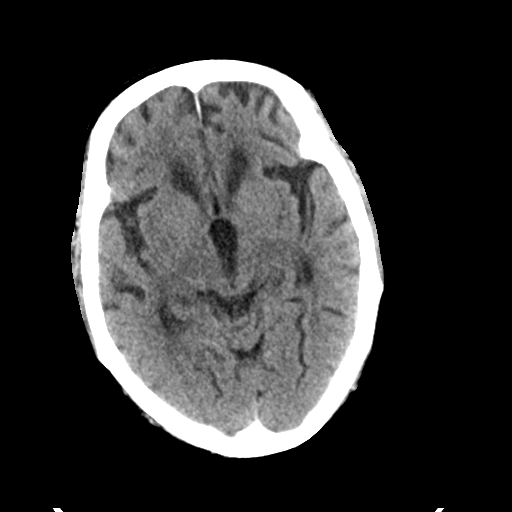
[im 17/33  brain]
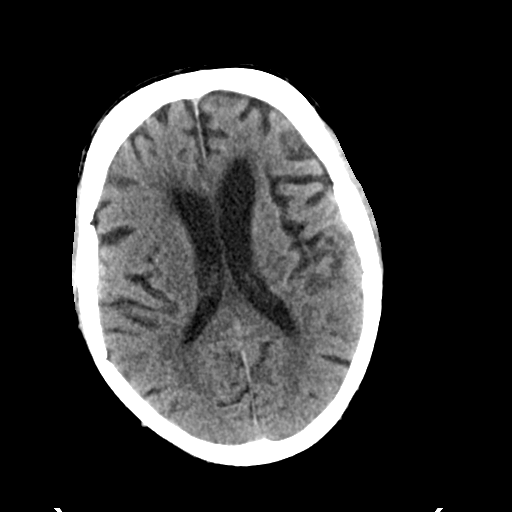
[im 21/33  brain]
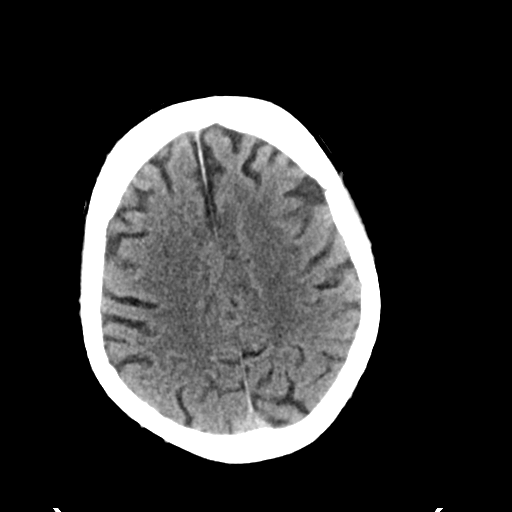
[im 21/33  bone]
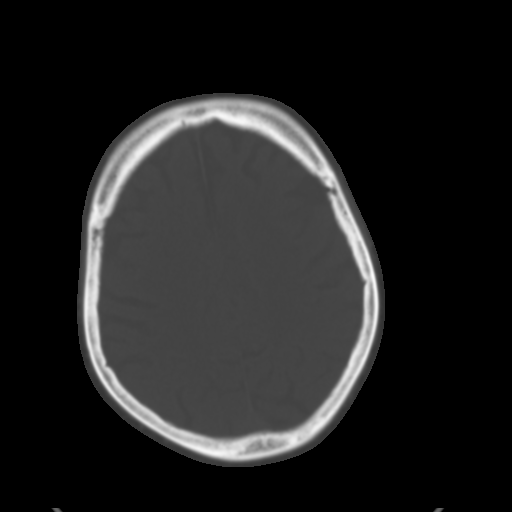
[im 25/33  brain]
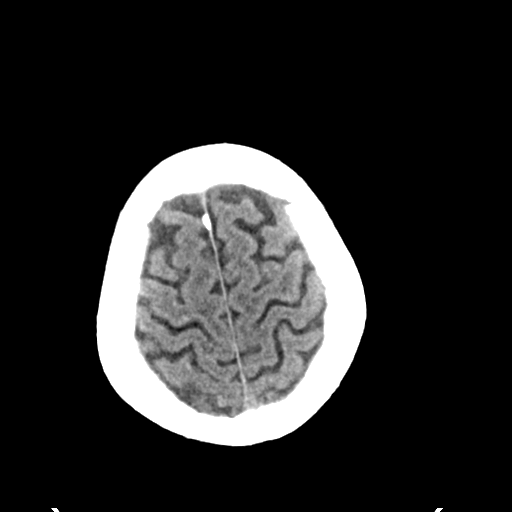
[im 29/33  brain]
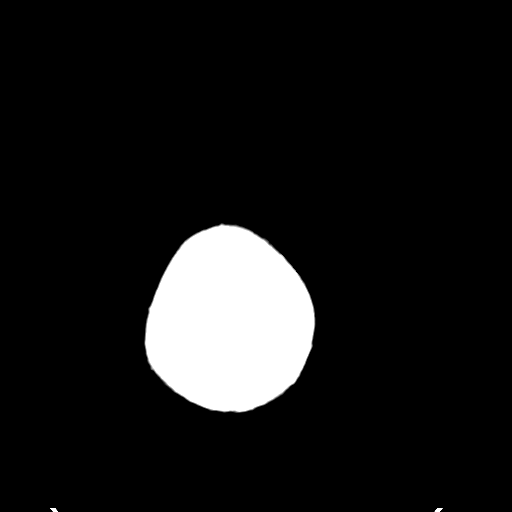

[Series 4: head bone · axial · 0.47mm/px · z∈[+1323,+1355]mm · 3 of 80 slices shown]
[im 8/80  bone]
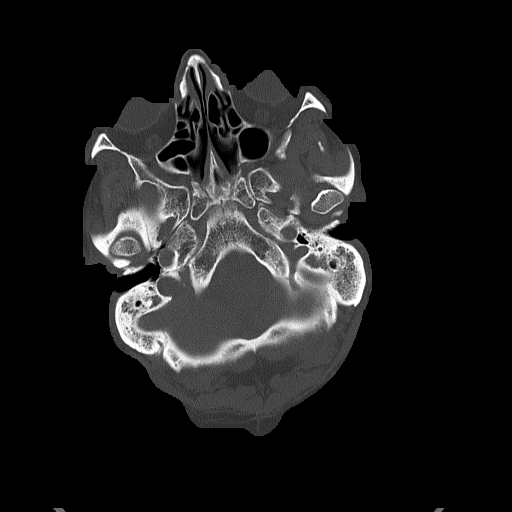
[im 16/80  bone]
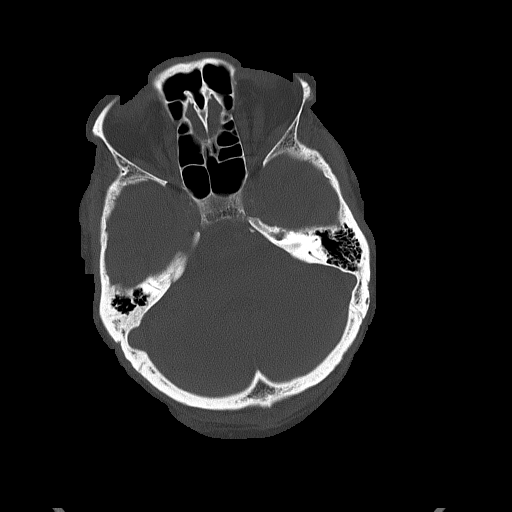
[im 24/80  bone]
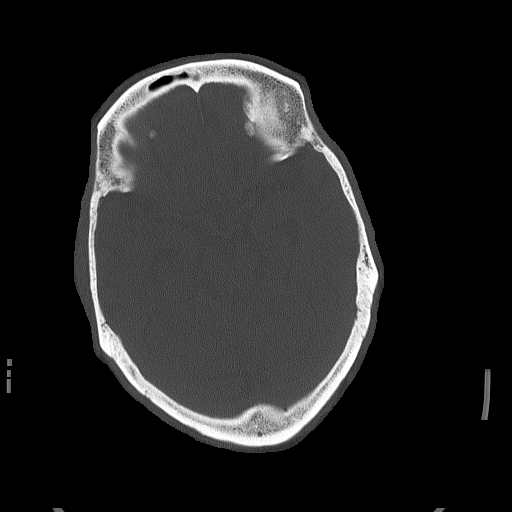

[Series 5: head without cor · coronal · non-contrast · 0.33mm/px · 3 of 70 slices shown]
[im 24/70  brain]
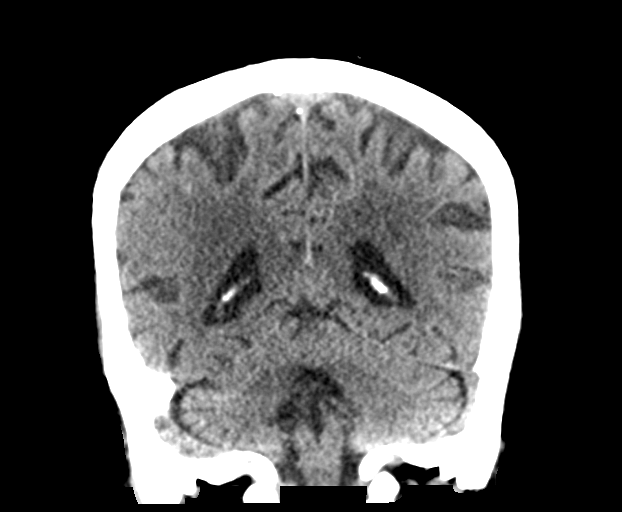
[im 31/70  brain]
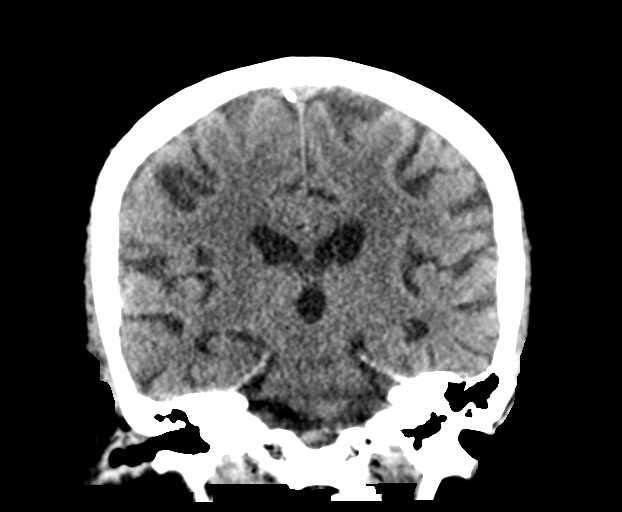
[im 39/70  brain]
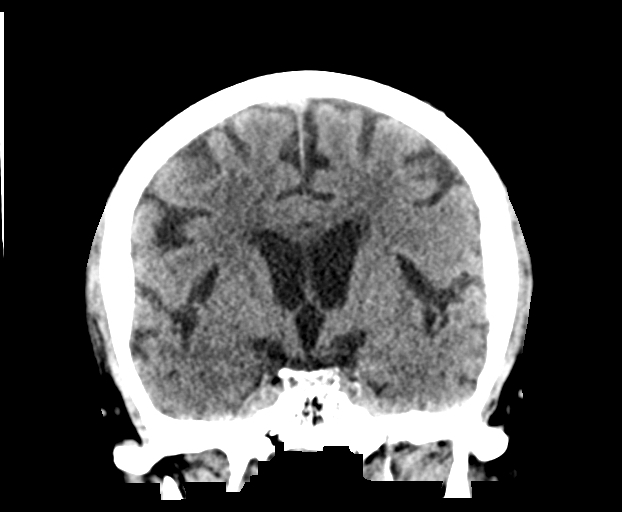

[Series 6: head without sag · sagittal · non-contrast · 0.33mm/px · 3 of 61 slices shown]
[im 21/61  brain]
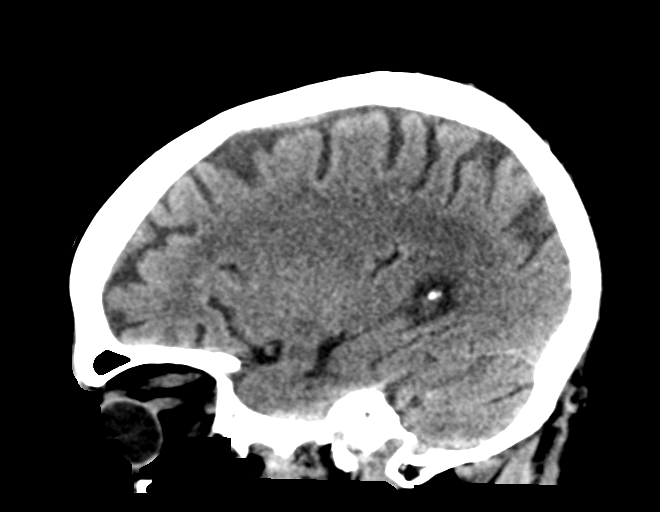
[im 31/61  brain]
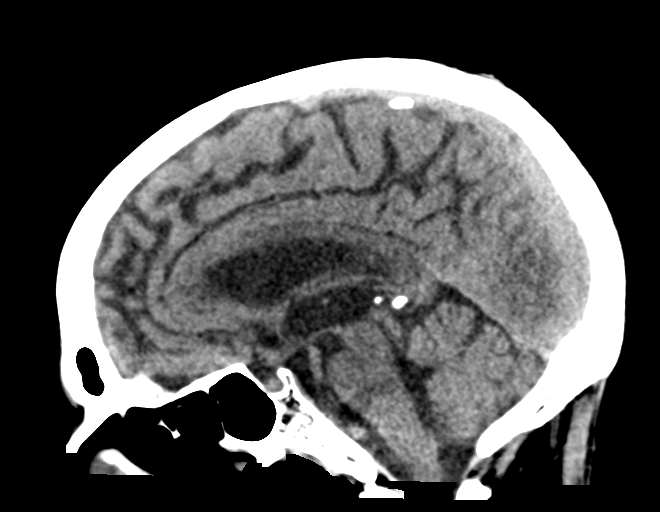
[im 41/61  brain]
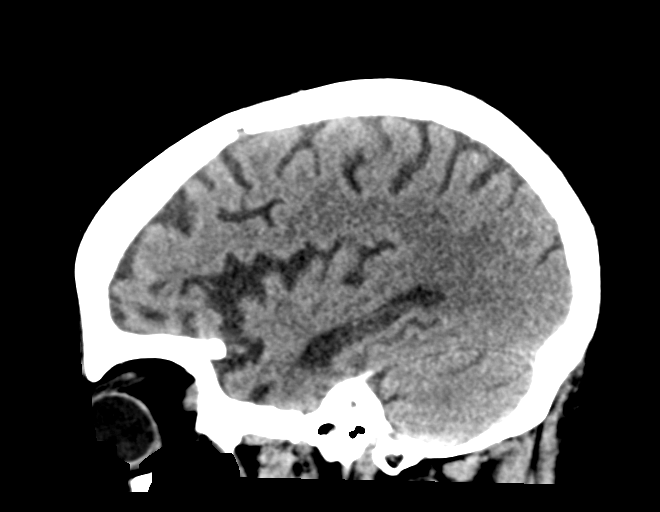

[16 of 47 positions shown; findings below may reference images not displayed]

FINDINGS: Brain: There is mild cerebral atrophy with widening of the
extra-axial spaces and ventricular dilatation.
There are areas of decreased attenuation within the white matter
tracts of the supratentorial brain, consistent with microvascular
disease changes.

Vascular: No hyperdense vessel or unexpected calcification.

Skull: Normal. Negative for fracture or focal lesion.

Sinuses/Orbits: No acute finding.

Other: None.
IMPRESSION: 1. Generalized cerebral atrophy.
2. No acute intracranial abnormality.

## 2022-01-02 ENCOUNTER — Emergency Department (HOSPITAL_COMMUNITY)
Admission: EM | Admit: 2022-01-02 | Discharge: 2022-01-02 | Disposition: A | Discharge status: Home / Self Care | Payer: PPO | Attending: Emergency Medicine | Admitting: Emergency Medicine

## 2022-01-02 ENCOUNTER — Encounter (HOSPITAL_COMMUNITY): Payer: Self-pay | Admitting: Emergency Medicine

## 2022-01-02 ENCOUNTER — Emergency Department (HOSPITAL_COMMUNITY): Payer: PPO

## 2022-01-02 DIAGNOSIS — Z79899 Other long term (current) drug therapy: Secondary | ICD-10-CM | POA: Insufficient documentation

## 2022-01-02 DIAGNOSIS — M79671 Pain in right foot: Secondary | ICD-10-CM | POA: Insufficient documentation

## 2022-01-02 DIAGNOSIS — M79674 Pain in right toe(s): Secondary | ICD-10-CM

## 2022-01-02 DIAGNOSIS — M109 Gout, unspecified: Secondary | ICD-10-CM | POA: Diagnosis not present

## 2022-01-02 DIAGNOSIS — Z7901 Long term (current) use of anticoagulants: Secondary | ICD-10-CM | POA: Insufficient documentation

## 2022-01-02 LAB — CBC
HCT: 36.6 % — ABNORMAL LOW (ref 39.0–52.0)
Hemoglobin: 12 g/dL — ABNORMAL LOW (ref 13.0–17.0)
MCH: 28.4 pg (ref 26.0–34.0)
MCHC: 32.8 g/dL (ref 30.0–36.0)
MCV: 86.7 fL (ref 80.0–100.0)
Platelets: 221 10*3/uL (ref 150–400)
RBC: 4.22 MIL/uL (ref 4.22–5.81)
RDW: 13.7 % (ref 11.5–15.5)
WBC: 6.4 10*3/uL (ref 4.0–10.5)
nRBC: 0 % (ref 0.0–0.2)

## 2022-01-02 LAB — BASIC METABOLIC PANEL
Anion gap: 6 (ref 5–15)
BUN: 17 mg/dL (ref 8–23)
CO2: 25 mmol/L (ref 22–32)
Calcium: 9 mg/dL (ref 8.9–10.3)
Chloride: 111 mmol/L (ref 98–111)
Creatinine, Ser: 1.56 mg/dL — ABNORMAL HIGH (ref 0.61–1.24)
GFR, Estimated: 43 mL/min — ABNORMAL LOW (ref >60)
Glucose, Bld: 86 mg/dL (ref 70–99)
Potassium: 3.5 mmol/L (ref 3.5–5.1)
Sodium: 142 mmol/L (ref 135–145)

## 2022-01-02 MED ORDER — TRAMADOL HCL 50 MG PO TABS: 50.0000 mg | ORAL_TABLET | Freq: Two times a day (BID) | ORAL | 0 refills | Status: DC | PRN

## 2022-01-02 NOTE — ED Provider Notes (Incomplete)
?[facility] ?Provider Note ? ? ?CSN: [id number] ?Arrival date & time: 01/02/22  1304 ? ?  ? ?History ?{Add pertinent medical, surgical, social history, OB history to HPI:1} ?Chief Complaint  ?Patient presents with  ? Foot Pain  ? ? ?Sergio [name] is a 88 y.o. male. ? ?HPI ? ?  ? ?Home Medications ?Prior to Admission medications   ?Medication Sig Start Date End Date Taking? Authorizing Provider  ?acetaminophen (TYLENOL) 500 MG tablet Take 500 mg by mouth every 6 (six) hours as needed for mild pain.     [provider]  ?amLODipine (NORVASC) 10 MG tablet Take 1 tablet (10 mg total) by mouth daily. 07/06/20 04/11/21  Antonieta Pert, MD  ?apixaban (ELIQUIS) 5 MG TABS tablet Take 1 tablet (5 mg total) by mouth 2 (two) times daily. 08/19/20   Dwyane Dee, MD  ?hydrALAZINE (APRESOLINE) 25 MG tablet Take 1 tablet (25 mg total) by mouth 3 (three) times daily. 07/06/20 08/29/20  Antonieta Pert, MD  ?ondansetron (ZOFRAN) 4 MG tablet Take 1 tablet (4 mg total) by mouth every 8 (eight) hours as needed for nausea or vomiting. 01/21/20   Hayden Rasmussen, MD  ?tamsulosin (FLOMAX) 0.4 MG CAPS capsule Take 1 capsule (0.4 mg total) by mouth daily. 07/02/20   Alexis Frock, MD  ?traMADol (ULTRAM) 50 MG tablet Take 50 mg by mouth every 8 (eight) hours as needed for moderate pain.  08/05/20   [provider]  ?   ? ?Allergies    ?Patient has no known allergies.   ? ?Review of Systems   ?Review of Systems ? ?Physical Exam ?Updated Vital Signs ?BP (!) 177/66   Pulse (!) 51   Temp 98.5 ?F (36.9 ?C) (Oral)   Resp (!) 21   SpO2 97%  ?Physical Exam ? ?ED Results / Procedures / Treatments   ?Labs ?(all labs ordered are listed, but only abnormal results are displayed) ?Labs Reviewed  ?CBC  ?BASIC METABOLIC PANEL  ? ? ?EKG ?None ? ?Radiology ?DG Foot Complete Right ? ?Result Date: 01/02/2022 ?CLINICAL DATA:  Pain in all toes last night common diabetic EXAM: RIGHT FOOT COMPLETE - 3+ VIEW  COMPARISON:  Bilateral foot radiographs 01/14/2012 FINDINGS: There is no acute fracture or dislocation. Bony alignment is normal. The joint spaces are preserved. The Lisfranc and Chopart joints are intact. There is mild degenerative change about the midfoot. Is no erosive or destructive change. There is inferior calcaneal spurring. Vascular calcifications are noted. The soft tissues are otherwise unremarkable. IMPRESSION: 1. No acute findings. 2. Mild degenerative changes about the midfoot and inferior calcaneal spurring. Electronically Signed   By: Valetta Mole M.D.   On: 01/02/2022 13:51   ? ?Procedures ?Procedures  ?{Document cardiac monitor, telemetry assessment procedure when appropriate:1} ? ?Medications Ordered in ED ?Medications - No data to display ? ?ED Course/ Medical Decision Making/ A&P ?Clinical Course as of 01/06/22 1524  ?Fri Jan 02, 2022  ?1824 CBC(!) ?CBC reviewed interpreted within normal limits [DR]  ?8588 Basic metabolic panel(!) ?[place] reviewed and interpreted and elevated creatinine at 1.56 which is stable from patient's baseline ? [DR]  ?  ?Clinical Course User Index ?[DR] Pattricia Boss, MD  ? ?                        ?Medical Decision Making ?Amount and/or Complexity of Data Reviewed ?Labs: ordered. Decision-making details documented in ED Course. ?Radiology: ordered. ? ? ?*** ? ?{  Document critical care time when appropriate:1} ?{Document review of labs and clinical decision tools ie heart score, Chads2Vasc2 etc:1}  ?{Document your independent review of radiology images, and any outside records:1} ?{Document your discussion with family members, caretakers, and with consultants:1} ?{Document social determinants of health affecting pt's care:1} ?{Document your decision making why or why not admission, treatments were needed:1} ?Final Clinical Impression(s) / ED Diagnoses ?Final diagnoses:  ?None  ? ? ?Rx / DC Orders ?ED Discharge Orders   ? ? None  ? ?  ? ? ?

## 2022-01-02 NOTE — Discharge Instructions (Addendum)
Your evaluation here is most consistent with gout. ?Please keep foot elevated ?Use acetaminophen and tramadol as needed for pain ?Return if you are having any increased redness, swelling, or fever. ?Please recheck with your doctor soon as possible ?

## 2022-01-02 NOTE — ED Provider Notes (Signed)
?[facility] ?Provider Note ? ? ?CSN: [id number] ?Arrival date & time: 01/02/22  1304 ? ?  ? ?History ? ?Chief Complaint  ?Patient presents with  ? Foot Pain  ? ? ?Sergio [name] is a 88 y.o. male. ? ?HPI ?88 year old male history of gout presents today complaining of pain in his right great toe at the PIP joint.  He states he has had some redness and swelling for the past couple days.  It is somewhat painful.  Is unable to tell me if this is similar to prior gout episodes.  His son who is the bedside states that he feels that it is.  He has not had any fever, chills, injury, redness extending up beyond his great toe area. ? ?  ? ?Home Medications ?Prior to Admission medications   ?Medication Sig Start Date End Date Taking? Authorizing Provider  ?acetaminophen (TYLENOL) 500 MG tablet Take 500 mg by mouth every 6 (six) hours as needed for mild pain.     [provider]  ?amLODipine (NORVASC) 10 MG tablet Take 1 tablet (10 mg total) by mouth daily. 07/06/20 04/11/21  Antonieta Pert, MD  ?apixaban (ELIQUIS) 5 MG TABS tablet Take 1 tablet (5 mg total) by mouth 2 (two) times daily. 08/19/20   Dwyane Dee, MD  ?hydrALAZINE (APRESOLINE) 25 MG tablet Take 1 tablet (25 mg total) by mouth 3 (three) times daily. 07/06/20 08/29/20  Antonieta Pert, MD  ?ondansetron (ZOFRAN) 4 MG tablet Take 1 tablet (4 mg total) by mouth every 8 (eight) hours as needed for nausea or vomiting. 01/21/20   Hayden Rasmussen, MD  ?tamsulosin (FLOMAX) 0.4 MG CAPS capsule Take 1 capsule (0.4 mg total) by mouth daily. 07/02/20   Alexis Frock, MD  ?traMADol (ULTRAM) 50 MG tablet Take 1 tablet (50 mg total) by mouth every 12 (twelve) hours as needed for moderate pain. 01/02/22   Pattricia Boss, MD  ?   ? ?Allergies    ?Patient has no known allergies.   ? ?Review of Systems   ?Review of Systems  ?All other systems reviewed and are negative. ? ?Physical Exam ?Updated Vital Signs ?BP (!) 177/66   Pulse (!) 51   Temp 98.5  ?F (36.9 ?C) (Oral)   Resp (!) 21   SpO2 97%  ?Physical Exam ?Vitals and nursing note reviewed.  ?HENT:  ?   Head: Normocephalic and atraumatic.  ?   Right Ear: External ear normal.  ?   Left Ear: External ear normal.  ?   Mouth/Throat:  ?   Pharynx: Oropharynx is clear.  ?Eyes:  ?   Pupils: Pupils are equal, round, and reactive to light.  ?Cardiovascular:  ?   Rate and Rhythm: Normal rate and regular rhythm.  ?Pulmonary:  ?   Effort: Pulmonary effort is normal.  ?   Breath sounds: Normal breath sounds.  ?Abdominal:  ?   Palpations: Abdomen is soft.  ?Musculoskeletal:  ?   Cervical back: Normal range of motion.  ?   Comments: Bilateral foot swelling which is mild ?Right PIP joint is warm and slightly tender compared to the left ?Pulses are intact bilaterally ?No obvious cellulitis  ?Skin: ?   General: Skin is warm and dry.  ?Neurological:  ?   General: No focal deficit present.  ?   Mental Status: He is alert.  ?Psychiatric:     ?   Mood and Affect: Mood normal.  ? ? ?ED Results / Procedures /  Treatments   ?Labs ?(all labs ordered are listed, but only abnormal results are displayed) ?Labs Reviewed  ?CBC - Abnormal; Notable for the following components:  ?    Result Value  ? Hemoglobin 12.0 (*)   ? HCT 36.6 (*)   ? All other components within normal limits  ?BASIC METABOLIC PANEL - Abnormal; Notable for the following components:  ? Creatinine, Ser 1.56 (*)   ? GFR, Estimated 43 (*)   ? All other components within normal limits  ? ? ?EKG ?EKG Interpretation ? ?Date/Time:  Friday January 02 2022 17:04:39 EDT ?Ventricular Rate:  53 ?PR Interval:  128 ?QRS Duration: 96 ?QT Interval:  466 ?QTC Calculation: 437 ?R Axis:   -19 ?Text Interpretation: Sinus bradycardia lvh with ivc and secondary repol abnormalities Abnormal ECG When compared with ECG of 20-Feb-2021 15:12, PREVIOUS ECG IS PRESENT No significant change since last tracing Confirmed by Pattricia Boss [phone]) on 01/02/2022 5:20:28 PM ? ?Radiology ?DG Foot Complete  Right ? ?Result Date: 01/02/2022 ?CLINICAL DATA:  Pain in all toes last night common diabetic EXAM: RIGHT FOOT COMPLETE - 3+ VIEW COMPARISON:  Bilateral foot radiographs 01/14/2012 FINDINGS: There is no acute fracture or dislocation. Bony alignment is normal. The joint spaces are preserved. The Lisfranc and Chopart joints are intact. There is mild degenerative change about the midfoot. Is no erosive or destructive change. There is inferior calcaneal spurring. Vascular calcifications are noted. The soft tissues are otherwise unremarkable. IMPRESSION: 1. No acute findings. 2. Mild degenerative changes about the midfoot and inferior calcaneal spurring. Electronically Signed   By: Valetta Mole M.D.   On: 01/02/2022 13:51   ? ?Procedures ?Procedures  ? ? ?Medications Ordered in ED ?Medications - No data to display ? ?ED Course/ Medical Decision Making/ A&P ?Clinical Course as of 01/02/22 1829  ?Fri Jan 02, 2022  ?1824 CBC(!) ?CBC reviewed interpreted within normal limits [DR]  ?7893 Basic metabolic panel(!) ?[place] reviewed and interpreted and elevated creatinine at 1.56 which is stable from patient's baseline ? [DR]  ?  ?Clinical Course User Index ?[DR] Pattricia Boss, MD  ? ?                        ?Medical Decision Making ?88 year old man history of gout presents with pain to right great toe ?There is some warmth noted no obvious effusion.  It is moderately tender ?X-rays obtained with no obvious signs of fracture ?Patient has CKD with creatinine today at 1.56 which is stable for him. ?However, this prohibits nonsteroidal anti-inflammatory use ?Doubt infection, no white blood cell count, no abnormalities or osteo or fracture noted on x-Junior Kenedy ?Will treat with immobilization and acetaminophen ? ? ?Amount and/or Complexity of Data Reviewed ?Labs: ordered. ?Radiology: ordered. ? ? ? ? ? ? ? ? ? ?Final Clinical Impression(s) / ED Diagnoses ?Final diagnoses:  ?Pain of toe of right foot  ?Acute gout, unspecified cause,  unspecified site  ? ? ?Rx / DC Orders ?ED Discharge Orders   ? ?      Ordered  ?  traMADol (ULTRAM) 50 MG tablet  Every 12 hours PRN       ? 01/02/22 1828  ? ?  ?  ? ?  ? ? ?  ?Pattricia Boss, MD ?01/02/22 1829 ? ?

## 2022-01-02 NOTE — ED Triage Notes (Signed)
Patient BIB [facility] from home with complaint right foot pain that started one day ago, denies injury. History of gout, states pain feels different that previous gout flare-ups. Patient is alert, oriented, and in no apparent distress at this time. ?

## 2022-01-03 ENCOUNTER — Telehealth (HOSPITAL_COMMUNITY): Payer: Self-pay | Admitting: Emergency Medicine

## 2022-01-03 MED ORDER — TRAMADOL HCL 50 MG PO TABS: 50.0000 mg | ORAL_TABLET | Freq: Two times a day (BID) | ORAL | 0 refills | Status: DC | PRN

## 2022-01-03 NOTE — Telephone Encounter (Signed)
Patient needs tramadol prescription sent to family [facility] as the original pharmacy did not have it. ?

## 2022-03-27 ENCOUNTER — Emergency Department (HOSPITAL_COMMUNITY): Payer: PPO

## 2022-03-27 ENCOUNTER — Inpatient Hospital Stay (HOSPITAL_COMMUNITY)
Admission: EM | Admit: 2022-03-27 | Discharge: 2022-04-10 | DRG: 871 | Disposition: A | Discharge status: Skilled Nursing Facility | Payer: PPO | Attending: Internal Medicine | Admitting: Internal Medicine

## 2022-03-27 ENCOUNTER — Other Ambulatory Visit: Payer: Self-pay

## 2022-03-27 ENCOUNTER — Encounter (HOSPITAL_COMMUNITY): Payer: Self-pay

## 2022-03-27 DIAGNOSIS — N1832 Chronic kidney disease, stage 3b: Secondary | ICD-10-CM | POA: Diagnosis present

## 2022-03-27 DIAGNOSIS — D6489 Other specified anemias: Secondary | ICD-10-CM | POA: Diagnosis present

## 2022-03-27 DIAGNOSIS — I130 Hypertensive heart and chronic kidney disease with heart failure and stage 1 through stage 4 chronic kidney disease, or unspecified chronic kidney disease: Secondary | ICD-10-CM | POA: Diagnosis present

## 2022-03-27 DIAGNOSIS — N179 Acute kidney failure, unspecified: Secondary | ICD-10-CM | POA: Diagnosis present

## 2022-03-27 DIAGNOSIS — E860 Dehydration: Secondary | ICD-10-CM | POA: Diagnosis present

## 2022-03-27 DIAGNOSIS — E11621 Type 2 diabetes mellitus with foot ulcer: Secondary | ICD-10-CM | POA: Diagnosis present

## 2022-03-27 DIAGNOSIS — E1122 Type 2 diabetes mellitus with diabetic chronic kidney disease: Secondary | ICD-10-CM | POA: Diagnosis present

## 2022-03-27 DIAGNOSIS — F0390 Unspecified dementia without behavioral disturbance: Secondary | ICD-10-CM | POA: Diagnosis present

## 2022-03-27 DIAGNOSIS — K8020 Calculus of gallbladder without cholecystitis without obstruction: Secondary | ICD-10-CM | POA: Diagnosis present

## 2022-03-27 DIAGNOSIS — A4159 Other Gram-negative sepsis: Secondary | ICD-10-CM | POA: Diagnosis present

## 2022-03-27 DIAGNOSIS — E876 Hypokalemia: Secondary | ICD-10-CM | POA: Diagnosis present

## 2022-03-27 DIAGNOSIS — N3000 Acute cystitis without hematuria: Secondary | ICD-10-CM | POA: Diagnosis present

## 2022-03-27 DIAGNOSIS — I214 Non-ST elevation (NSTEMI) myocardial infarction: Secondary | ICD-10-CM | POA: Diagnosis present

## 2022-03-27 DIAGNOSIS — Z833 Family history of diabetes mellitus: Secondary | ICD-10-CM

## 2022-03-27 DIAGNOSIS — G9340 Encephalopathy, unspecified: Secondary | ICD-10-CM | POA: Diagnosis not present

## 2022-03-27 DIAGNOSIS — L89322 Pressure ulcer of left buttock, stage 2: Secondary | ICD-10-CM | POA: Diagnosis present

## 2022-03-27 DIAGNOSIS — R7989 Other specified abnormal findings of blood chemistry: Secondary | ICD-10-CM | POA: Diagnosis present

## 2022-03-27 DIAGNOSIS — R6520 Severe sepsis without septic shock: Secondary | ICD-10-CM | POA: Diagnosis not present

## 2022-03-27 DIAGNOSIS — E785 Hyperlipidemia, unspecified: Secondary | ICD-10-CM | POA: Diagnosis present

## 2022-03-27 DIAGNOSIS — M6282 Rhabdomyolysis: Secondary | ICD-10-CM | POA: Diagnosis present

## 2022-03-27 DIAGNOSIS — A419 Sepsis, unspecified organism: Secondary | ICD-10-CM | POA: Diagnosis not present

## 2022-03-27 DIAGNOSIS — Z20822 Contact with and (suspected) exposure to covid-19: Secondary | ICD-10-CM | POA: Diagnosis present

## 2022-03-27 DIAGNOSIS — E11622 Type 2 diabetes mellitus with other skin ulcer: Secondary | ICD-10-CM | POA: Diagnosis present

## 2022-03-27 DIAGNOSIS — A412 Sepsis due to unspecified staphylococcus: Secondary | ICD-10-CM | POA: Diagnosis present

## 2022-03-27 DIAGNOSIS — L89311 Pressure ulcer of right buttock, stage 1: Secondary | ICD-10-CM | POA: Diagnosis present

## 2022-03-27 DIAGNOSIS — E119 Type 2 diabetes mellitus without complications: Secondary | ICD-10-CM

## 2022-03-27 DIAGNOSIS — L97529 Non-pressure chronic ulcer of other part of left foot with unspecified severity: Secondary | ICD-10-CM | POA: Diagnosis present

## 2022-03-27 DIAGNOSIS — L8990 Pressure ulcer of unspecified site, unspecified stage: Secondary | ICD-10-CM | POA: Insufficient documentation

## 2022-03-27 DIAGNOSIS — G9341 Metabolic encephalopathy: Secondary | ICD-10-CM | POA: Diagnosis present

## 2022-03-27 DIAGNOSIS — R4182 Altered mental status, unspecified: Secondary | ICD-10-CM | POA: Diagnosis present

## 2022-03-27 DIAGNOSIS — I5032 Chronic diastolic (congestive) heart failure: Secondary | ICD-10-CM | POA: Diagnosis present

## 2022-03-27 DIAGNOSIS — Z86010 Personal history of colonic polyps: Secondary | ICD-10-CM

## 2022-03-27 DIAGNOSIS — R339 Retention of urine, unspecified: Secondary | ICD-10-CM | POA: Diagnosis present

## 2022-03-27 DIAGNOSIS — R7881 Bacteremia: Secondary | ICD-10-CM | POA: Diagnosis not present

## 2022-03-27 DIAGNOSIS — A528 Late syphilis, latent: Secondary | ICD-10-CM | POA: Diagnosis present

## 2022-03-27 DIAGNOSIS — N401 Enlarged prostate with lower urinary tract symptoms: Secondary | ICD-10-CM | POA: Diagnosis present

## 2022-03-27 DIAGNOSIS — E870 Hyperosmolality and hypernatremia: Secondary | ICD-10-CM | POA: Diagnosis present

## 2022-03-27 DIAGNOSIS — R338 Other retention of urine: Secondary | ICD-10-CM | POA: Diagnosis present

## 2022-03-27 DIAGNOSIS — L89222 Pressure ulcer of left hip, stage 2: Secondary | ICD-10-CM | POA: Diagnosis present

## 2022-03-27 DIAGNOSIS — B9689 Other specified bacterial agents as the cause of diseases classified elsewhere: Secondary | ICD-10-CM | POA: Diagnosis not present

## 2022-03-27 DIAGNOSIS — E1165 Type 2 diabetes mellitus with hyperglycemia: Secondary | ICD-10-CM | POA: Diagnosis present

## 2022-03-27 DIAGNOSIS — N390 Urinary tract infection, site not specified: Secondary | ICD-10-CM | POA: Diagnosis not present

## 2022-03-27 DIAGNOSIS — I21A1 Myocardial infarction type 2: Secondary | ICD-10-CM | POA: Diagnosis present

## 2022-03-27 DIAGNOSIS — Z86711 Personal history of pulmonary embolism: Secondary | ICD-10-CM

## 2022-03-27 DIAGNOSIS — Z87891 Personal history of nicotine dependence: Secondary | ICD-10-CM

## 2022-03-27 DIAGNOSIS — E861 Hypovolemia: Secondary | ICD-10-CM | POA: Diagnosis present

## 2022-03-27 LAB — URINALYSIS, ROUTINE W REFLEX MICROSCOPIC
Bilirubin Urine: NEGATIVE
Glucose, UA: NEGATIVE mg/dL
Ketones, ur: NEGATIVE mg/dL
Nitrite: NEGATIVE
Protein, ur: 100 mg/dL — AB
RBC / HPF: >50 RBC/hpf — ABNORMAL HIGH (ref 0–5)
Specific Gravity, Urine: 1.013 (ref 1.005–1.030)
WBC, UA: >50 WBC/hpf — ABNORMAL HIGH (ref 0–5)
pH: 8 (ref 5.0–8.0)

## 2022-03-27 LAB — COMPREHENSIVE METABOLIC PANEL
ALT: 40 U/L (ref 0–44)
AST: 78 U/L — ABNORMAL HIGH (ref 15–41)
Albumin: 2.3 g/dL — ABNORMAL LOW (ref 3.5–5.0)
Alkaline Phosphatase: 46 U/L (ref 38–126)
Anion gap: 14 (ref 5–15)
BUN: 78 mg/dL — ABNORMAL HIGH (ref 8–23)
CO2: 21 mmol/L — ABNORMAL LOW (ref 22–32)
Calcium: 9.5 mg/dL (ref 8.9–10.3)
Chloride: 128 mmol/L — ABNORMAL HIGH (ref 98–111)
Creatinine, Ser: 4.43 mg/dL — ABNORMAL HIGH (ref 0.61–1.24)
GFR, Estimated: 12 mL/min — ABNORMAL LOW (ref >60)
Glucose, Bld: 259 mg/dL — ABNORMAL HIGH (ref 70–99)
Potassium: 4.3 mmol/L (ref 3.5–5.1)
Sodium: 163 mmol/L (ref 135–145)
Total Bilirubin: 1.1 mg/dL (ref 0.3–1.2)
Total Protein: 7.5 g/dL (ref 6.5–8.1)

## 2022-03-27 LAB — CBG MONITORING, ED: Glucose-Capillary: 155 mg/dL — ABNORMAL HIGH (ref 70–99)

## 2022-03-27 LAB — CBC WITH DIFFERENTIAL/PLATELET
Abs Immature Granulocytes: 0.13 10*3/uL — ABNORMAL HIGH (ref 0.00–0.07)
Basophils Absolute: 0.1 10*3/uL (ref 0.0–0.1)
Basophils Relative: 0 %
Eosinophils Absolute: 0.1 10*3/uL (ref 0.0–0.5)
Eosinophils Relative: 0 %
HCT: 42.4 % (ref 39.0–52.0)
Hemoglobin: 13 g/dL (ref 13.0–17.0)
Immature Granulocytes: 1 %
Lymphocytes Relative: 4 %
Lymphs Abs: 0.9 10*3/uL (ref 0.7–4.0)
MCH: 27.5 pg (ref 26.0–34.0)
MCHC: 30.7 g/dL (ref 30.0–36.0)
MCV: 89.6 fL (ref 80.0–100.0)
Monocytes Absolute: 0.7 10*3/uL (ref 0.1–1.0)
Monocytes Relative: 3 %
Neutro Abs: 18.3 10*3/uL — ABNORMAL HIGH (ref 1.7–7.7)
Neutrophils Relative %: 92 %
Platelets: 235 10*3/uL (ref 150–400)
RBC: 4.73 MIL/uL (ref 4.22–5.81)
RDW: 15 % (ref 11.5–15.5)
WBC: 20.1 10*3/uL — ABNORMAL HIGH (ref 4.0–10.5)
nRBC: 0 % (ref 0.0–0.2)

## 2022-03-27 LAB — I-STAT VENOUS BLOOD GAS, ED
Acid-base deficit: 2 mmol/L (ref 0.0–2.0)
Bicarbonate: 23.2 mmol/L (ref 20.0–28.0)
Calcium, Ion: 1.24 mmol/L (ref 1.15–1.40)
HCT: 38 % — ABNORMAL LOW (ref 39.0–52.0)
Hemoglobin: 12.9 g/dL — ABNORMAL LOW (ref 13.0–17.0)
O2 Saturation: 96 %
Potassium: 4.1 mmol/L (ref 3.5–5.1)
Sodium: 165 mmol/L (ref 135–145)
TCO2: 24 mmol/L (ref 22–32)
pCO2, Ven: 41.7 mmHg — ABNORMAL LOW (ref 44–60)
pH, Ven: 7.354 (ref 7.25–7.43)
pO2, Ven: 85 mmHg — ABNORMAL HIGH (ref 32–45)

## 2022-03-27 LAB — RESP PANEL BY RT-PCR (FLU A&B, COVID) ARPGX2
Influenza A by PCR: NEGATIVE
Influenza B by PCR: NEGATIVE
SARS Coronavirus 2 by RT PCR: NEGATIVE

## 2022-03-27 LAB — CK: Total CK: 1858 U/L — ABNORMAL HIGH (ref 49–397)

## 2022-03-27 LAB — LACTIC ACID, PLASMA
Lactic Acid, Venous: 4.7 mmol/L (ref 0.5–1.9)
Lactic Acid, Venous: 3.5 mmol/L (ref 0.5–1.9)

## 2022-03-27 LAB — TROPONIN I (HIGH SENSITIVITY)
Troponin I (High Sensitivity): 2634 ng/L (ref <18)
Troponin I (High Sensitivity): 5593 ng/L (ref <18)

## 2022-03-27 LAB — PROTIME-INR
INR: 1.4 — ABNORMAL HIGH (ref 0.8–1.2)
Prothrombin Time: 16.7 seconds — ABNORMAL HIGH (ref 11.4–15.2)

## 2022-03-27 LAB — MAGNESIUM: Magnesium: 2.6 mg/dL — ABNORMAL HIGH (ref 1.7–2.4)

## 2022-03-27 LAB — TSH: TSH: 4.841 u[IU]/mL — ABNORMAL HIGH (ref 0.350–4.500)

## 2022-03-27 LAB — AMMONIA: Ammonia: 53 umol/L — ABNORMAL HIGH (ref 9–35)

## 2022-03-27 MED ORDER — SODIUM CHLORIDE 0.9 % IV SOLN
1.0000 g | INTRAVENOUS | Status: DC
  Administered 2022-03-27 – 2022-03-31 (×5): 1 g via INTRAVENOUS
  Filled 2022-03-27 (×5): qty 10

## 2022-03-27 MED ORDER — ACETAMINOPHEN 325 MG PO TABS
650.0000 mg | ORAL_TABLET | Freq: Four times a day (QID) | ORAL | Status: DC | PRN
  Administered 2022-04-04 (×3): 650 mg via ORAL
  Filled 2022-03-27 (×3): qty 2

## 2022-03-27 MED ORDER — LACTATED RINGERS IV BOLUS (SEPSIS)
1000.0000 mL | Freq: Once | INTRAVENOUS | Status: AC
Start: 2022-03-27 — End: 2022-03-27
  Administered 2022-03-27: 1000 mL via INTRAVENOUS

## 2022-03-27 MED ORDER — TAMSULOSIN HCL 0.4 MG PO CAPS
0.4000 mg | ORAL_CAPSULE | Freq: Every day | ORAL | Status: DC
  Administered 2022-03-28 – 2022-04-10 (×14): 0.4 mg via ORAL
  Filled 2022-03-27 (×14): qty 1

## 2022-03-27 MED ORDER — INSULIN ASPART 100 UNIT/ML IJ SOLN
0.0000 [IU] | Freq: Three times a day (TID) | INTRAMUSCULAR | Status: DC
  Administered 2022-03-28: 1 [IU] via SUBCUTANEOUS
  Administered 2022-03-28 – 2022-03-29 (×2): 3 [IU] via SUBCUTANEOUS

## 2022-03-27 MED ORDER — ACETAMINOPHEN 650 MG RE SUPP: 650.0000 mg | Freq: Four times a day (QID) | RECTAL | Status: DC | PRN

## 2022-03-27 MED ORDER — HEPARIN (PORCINE) 25000 UT/250ML-% IV SOLN
1050.0000 [IU]/h | INTRAVENOUS | Status: DC
  Administered 2022-03-27 – 2022-03-29 (×2): 800 [IU]/h via INTRAVENOUS
  Administered 2022-03-30 – 2022-03-31 (×2): 1050 [IU]/h via INTRAVENOUS
  Filled 2022-03-27 (×5): qty 250

## 2022-03-27 MED ORDER — VANCOMYCIN HCL 1250 MG/250ML IV SOLN
1250.0000 mg | Freq: Once | INTRAVENOUS | Status: AC
  Administered 2022-03-27: 1250 mg via INTRAVENOUS
  Filled 2022-03-27: qty 250

## 2022-03-27 MED ORDER — ACETAMINOPHEN 650 MG RE SUPP
650.0000 mg | Freq: Once | RECTAL | Status: AC
  Administered 2022-03-27: 650 mg via RECTAL
  Filled 2022-03-27: qty 1

## 2022-03-27 MED ORDER — DEXTROSE 5 % IV SOLN: INTRAVENOUS | Status: AC

## 2022-03-27 NOTE — Assessment & Plan Note (Signed)
 #)   Hypercalcemia: Presenting labs reflect serum calcium, corrected for mild hypoalbuminemia, to be 10.8.  Suspect element of dehydration, without overt pharmacologic contribution. Will proceed with IVF's, as above, with repeat calcium level in the morning, with consideration for further expansion of work-up if no ensuing improvement in serum calcium level following interval IVF administration.  Potential secondary contribution towards the patient's presenting acute metabolic encephalopathy.     Plan: IV fluids, as above.  Monitor strict I's&O's, daily weights.  CMP in the morning.  Check serum Mg and Phos levels.

## 2022-03-27 NOTE — Progress Notes (Signed)
ANTICOAGULATION CONSULT NOTE - Initial Consult  Pharmacy Consult for Heparin Indication: chest pain/ACS  No Known Allergies  Patient Measurements: Height: '5\' 5"'$  (165.1 cm) IBW/kg (Calculated) : 61.5 Heparin Dosing Weight: 65 kg  Vital Signs: Temp: 101 F (38.3 C) (06/30 1717) Temp Source: Rectal (06/30 1717) BP: 181/69 (06/30 2130) Pulse Rate: 84 (06/30 2130)  Labs: Recent Labs    03/27/22 1737 03/27/22 1810 03/27/22 2002  HGB 13.0 12.9*  --   HCT 42.4 38.0*  --   PLT 235  --   --   LABPROT 16.7*  --   --   INR 1.4*  --   --   CREATININE 4.43*  --   --   CKTOTAL 1,858*  --   --   TROPONINIHS 2,634*  --  5,593*    CrCl cannot be calculated (Unknown ideal weight.).   Medical History: Past Medical History:  Diagnosis Date   Colon polyps 06/22/2014   4 colon polyps by colonoscopy.  Repeat 3 years.  Hung.   Diabetes mellitus    Hypertension    Other and unspecified hyperlipidemia    PE (pulmonary embolism)     Medications:  (Not in a hospital admission)  Scheduled:   [START ON 03/28/2022] insulin aspart  0-9 Units Subcutaneous TID WC   Infusions:   cefTRIAXone (ROCEPHIN)  IV Stopped (03/27/22 1838)   dextrose     PRN: acetaminophen **OR** acetaminophen  Assessment: 20 yom with a history of HTN, T2DM, hx of PE previously eliquis but has not been taking per son secondary to access issues, prior chronic foley. Patient is presenting with AMS. Heparin per pharmacy consult placed for chest pain/ACS.  Patient is not on anticoagulation prior to arrival.  Hgb 12.9; plt 235 PT / INR 16.7 / 1.4 hsTrop 5593  Goal of Therapy:  Heparin level 0.3-0.7 units/ml Monitor platelets by anticoagulation protocol: Yes   Plan:  No initial heparin bolus Start heparin infusion at 800 units/hr Check anti-Xa level in 8 hours and daily while on heparin Continue to monitor H&H and platelets  Lorelei Pont, PharmD, BCPS 03/27/2022 9:51 PM ED Clinical Pharmacist -   [phone]

## 2022-03-27 NOTE — ED Notes (Signed)
Difficulty inserting foley, provider aware

## 2022-03-27 NOTE — Assessment & Plan Note (Signed)
  #)   Acute metabolic encephalopathy: Progressive confusion/somnolence over the course of the last week, which appears multifactorial in nature, with metabolic contributions from severe sepsis due to UTI as well as acute hyponatremia, dehydration, acute kidney injury and mild hypercalcemia.  No evidence of overt seizures at this time.  While unable to perform complete neurologic assessment at this time, patient appears to be spontaneously moving all 4 extremities.  Overall, acute CVA felt to be less likely at this time, but will continue to closely monitor few every 4 hours neurochecks.  Of note, CT head showed no evidence of acute intracranial process, as further detailed above.  Presenting TSH mildly elevated.  We will add on free T4 to assist with further clarification of this result.  Presenting VBG demonstrates no evidence to suggest hypercapnic encephalopathy.  Additionally, ammonia level found to be 53.  Plan: Further evaluation management of severe sepsis due to UTI, as above, including continuation of IV Rocephin other evaluation management of acute hyponatremia, clinically and via IV fluids, as conveyed above, as well as further evaluation management AKI on CKD 3B, as above.  Add on free T4 repeat CMP and CBC in the morning.  Check urinary drug screen.  Fall precautions ordered.

## 2022-03-27 NOTE — ED Notes (Signed)
Lactic acid reported to [place], [place] MD

## 2022-03-27 NOTE — Assessment & Plan Note (Signed)
  #)   History of urinary retention: Documented history of such, including documented history of BPH for which the patient was previously on tamsulosin as well as per chart review revealing a history of urethral balloon dilation in July 2022.  Foley catheter being placed in the ED this evening, provide discussions with the EDP given report of evidence of urinary retention this evening.  Notable in the context of acute kidney injury superimposed on CKD 3B, raising possibility of postrenal contribution to interval worsening in renal function in addition to the previously discussed prerenal contributions from dehydration, as well as severe sepsis.   Plan: Monitor strict I's and O's and daily weights.  IV fluids, as above.  Repeat CMP in the morning.  Reinitiated tamsulosin, which was reportedly recently discontinued as an outpatient.

## 2022-03-27 NOTE — Assessment & Plan Note (Signed)
  #)   Chronic diastolic heart failure: documented history of such, with most recent echocardiogram performed in May 2018 notable for LVEF 55 to 60% as well as grade 1 diastolic dysfunction, with additional details as conveyed above. No clinical or radiographic evidence to suggest acutely decompensated heart failure at this time.  Does not appear to be on any scheduled diuretic medications as an outpatient.  We will closely monitor volume status for evidence of ensuing acute volume overload given plan for additional IV fluids in the setting of suspected hypovolemic hypernatremia, as above.    Plan: monitor strict I's & O's and daily weights. Repeat BMP in AM. Check serum mag level.  Add on BNP.  Will follow for results of echocardiogram in the morning, as above.

## 2022-03-27 NOTE — ED Provider Notes (Signed)
[facility] EMERGENCY DEPARTMENT Provider Note   CSN: [id number] Arrival date & time: 03/27/22  1655     History  Chief Complaint  Patient presents with   Altered Mental Status    Sergio [name] is a 88 y.o. male.  88 year old male with a past medical history of hypertension, type 2 diabetes, prior PE on Eliquis with prior chronic Foley catheter placement secondary to hydronephrosis presents to the ED via EMS with altered mental status.  EMS states that family at home were poor historians. Reported, the patient was last known normal last Thursday which was 8 days ago.  EMS states that the patient has been minimally responsive since then, reportedly sitting all week in his wheelchair.  EMS states that patient is baseline alert and oriented x4. EMS states that patient was tachypneic in route but not hypoxic, otherwise hemodynamically stable.  Point-of-care glucose in the 300s. Patient did receive fluids in route.  EMS also expressed concern for possible stroke as they had noted left-sided facial droop with the patient preferentially leaning to the left. Family reportedly told EMS that patient always leans to the left and did not feel that face was asymmetric.  The history is provided by the EMS personnel. The history is limited by the condition of the patient.       Home Medications Prior to Admission medications   Medication Sig Start Date End Date Taking? Authorizing Provider  amLODipine (NORVASC) 10 MG tablet Take 1 tablet (10 mg total) by mouth daily. Patient not taking: Reported on 03/27/2022 07/06/20 04/11/21  Antonieta Pert, MD  apixaban (ELIQUIS) 5 MG TABS tablet Take 1 tablet (5 mg total) by mouth 2 (two) times daily. Patient not taking: Reported on 03/27/2022 08/19/20   Dwyane Dee, MD  hydrALAZINE (APRESOLINE) 25 MG tablet Take 1 tablet (25 mg total) by mouth 3 (three) times daily. Patient not taking: Reported on 03/27/2022 07/06/20 08/29/20  Antonieta Pert, MD   ondansetron (ZOFRAN) 4 MG tablet Take 1 tablet (4 mg total) by mouth every 8 (eight) hours as needed for nausea or vomiting. Patient not taking: Reported on 03/27/2022 01/21/20   Hayden Rasmussen, MD  tamsulosin (FLOMAX) 0.4 MG CAPS capsule Take 1 capsule (0.4 mg total) by mouth daily. Patient not taking: Reported on 03/27/2022 07/02/20   Alexis Frock, MD  traMADol (ULTRAM) 50 MG tablet Take 1 tablet (50 mg total) by mouth every 12 (twelve) hours as needed for moderate pain. Patient not taking: Reported on 03/27/2022 01/03/22   Sherwood Gambler, MD      Allergies    Patient has no known allergies.    Review of Systems   Review of Systems  Unable to perform ROS: Mental status change    Physical Exam Updated Vital Signs BP (!) 181/70   Pulse 97   Temp (!) 101 F (38.3 C) (Rectal)   Resp (!) 28   Ht '5\' 5"'$  (1.651 m)   Wt 65.8 kg   SpO2 99%   BMI 24.13 kg/m  Physical Exam Vitals and nursing note reviewed.  Constitutional:      General: He is in acute distress.     Appearance: He is ill-appearing and toxic-appearing.  HENT:     Mouth/Throat:     Mouth: Mucous membranes are dry.  Eyes:     General:        Left eye: Discharge present. Cardiovascular:     Rate and Rhythm: Normal rate. Rhythm irregular.  Pulses:          Radial pulses are 2+ on the right side and 2+ on the left side.     Heart sounds: Murmur heard.     Systolic murmur is present with a grade of 4/6.  Abdominal:     Tenderness: There is abdominal tenderness.  Skin:    Comments: Scattered skin tears present on the bilateral upper and lower extremities.  These do not appear to have surrounding cellulitis.  He does have a stage I decubitus ulcer present to the right buttocks and stage II decubitus ulcer present to the left buttocks.  No sacral wounds appreciated.  There is additionally an ulcerated wound present to the left great toe without surrounding cellulitis or purulent discharge.  Neurological:     GCS:  GCS eye subscore is 2. GCS verbal subscore is 2. GCS motor subscore is 4.     Comments: Left sided facial droop at rest with equal activation when wincing. He does preferentially lean to his left but is able to cross midline when turning his head. Whispers in response to questions, does not follow commands.     ED Results / Procedures / Treatments   Labs (all labs ordered are listed, but only abnormal results are displayed) Labs Reviewed  LACTIC ACID, PLASMA - Abnormal; Notable for the following components:      Result Value   Lactic Acid, Venous 4.7 (*)    All other components within normal limits  LACTIC ACID, PLASMA - Abnormal; Notable for the following components:   Lactic Acid, Venous 3.5 (*)    All other components within normal limits  COMPREHENSIVE METABOLIC PANEL - Abnormal; Notable for the following components:   Sodium 163 (*)    Chloride 128 (*)    CO2 21 (*)    Glucose, Bld 259 (*)    BUN 78 (*)    Creatinine, Ser 4.43 (*)    Albumin 2.3 (*)    AST 78 (*)    GFR, Estimated 12 (*)    All other components within normal limits  CBC WITH DIFFERENTIAL/PLATELET - Abnormal; Notable for the following components:   WBC 20.1 (*)    Neutro Abs 18.3 (*)    Abs Immature Granulocytes 0.13 (*)    All other components within normal limits  PROTIME-INR - Abnormal; Notable for the following components:   Prothrombin Time 16.7 (*)    INR 1.4 (*)    All other components within normal limits  URINALYSIS, ROUTINE W REFLEX MICROSCOPIC - Abnormal; Notable for the following components:   Color, Urine AMBER (*)    APPearance CLOUDY (*)    Hgb urine dipstick MODERATE (*)    Protein, ur 100 (*)    Leukocytes,Ua SMALL (*)    RBC / HPF >50 (*)    WBC, UA >50 (*)    Bacteria, UA MANY (*)    All other components within normal limits  CK - Abnormal; Notable for the following components:   Total CK 1,858 (*)    All other components within normal limits  TSH - Abnormal; Notable for the  following components:   TSH 4.841 (*)    All other components within normal limits  AMMONIA - Abnormal; Notable for the following components:   Ammonia 53 (*)    All other components within normal limits  MAGNESIUM - Abnormal; Notable for the following components:   Magnesium 2.6 (*)    All other components within normal limits  I-STAT VENOUS BLOOD GAS, ED - Abnormal; Notable for the following components:   pCO2, Ven 41.7 (*)    pO2, Ven 85 (*)    Sodium 165 (*)    HCT 38.0 (*)    Hemoglobin 12.9 (*)    All other components within normal limits  CBG MONITORING, ED - Abnormal; Notable for the following components:   Glucose-Capillary 155 (*)    All other components within normal limits  TROPONIN I (HIGH SENSITIVITY) - Abnormal; Notable for the following components:   Troponin I (High Sensitivity) 2,634 (*)    All other components within normal limits  TROPONIN I (HIGH SENSITIVITY) - Abnormal; Notable for the following components:   Troponin I (High Sensitivity) 5,593 (*)    All other components within normal limits  RESP PANEL BY RT-PCR (FLU A&B, COVID) ARPGX2  CULTURE, BLOOD (ROUTINE X 2)  CULTURE, BLOOD (ROUTINE X 2)  URINE CULTURE  CBC WITH DIFFERENTIAL/PLATELET  COMPREHENSIVE METABOLIC PANEL  MAGNESIUM  BASIC METABOLIC PANEL  LACTIC ACID, PLASMA  LACTIC ACID, PLASMA  BASIC METABOLIC PANEL  HEMOGLOBIN A1C  HEPARIN LEVEL (UNFRACTIONATED)  CK    EKG EKG Interpretation  Date/Time:  Friday March 27 2022 17:38:00 EDT Ventricular Rate:  93 PR Interval:  113 QRS Duration: 87 QT Interval:  353 QTC Calculation: 439 R Axis:   67 Text Interpretation: Sinus rhythm Borderline short PR interval Consider RVH or posterior infarct Nonspecific repol abnormality, diffuse leads Confirmed by Thamas Jaegers ([id number]) on 03/27/2022 5:57:18 PM  Radiology CT ABDOMEN PELVIS WO CONTRAST  Result Date: 03/27/2022 CLINICAL DATA:  Altered mental status EXAM: CT ABDOMEN AND PELVIS WITHOUT CONTRAST  TECHNIQUE: Multidetector CT imaging of the abdomen and pelvis was performed following the standard protocol without IV contrast. RADIATION DOSE REDUCTION: This exam was performed according to the departmental dose-optimization program which includes automated exposure control, adjustment of the mA and/or kV according to patient size and/or use of iterative reconstruction technique. COMPARISON:  CT 06/30/2020 FINDINGS: Lower chest: Lung bases demonstrate no acute consolidation or effusion. Hepatobiliary: Multiple gallstones. No focal hepatic abnormality or biliary dilatation Pancreas: Unremarkable. No pancreatic ductal dilatation or surrounding inflammatory changes. Spleen: Curvilinear splenic calcifications as before. Adrenals/Urinary Tract: Adrenals within normal limits. Kidneys show no hydronephrosis. Mildly prominent left ureter without obstructing stone. Slightly thick-walled appearance of the urinary bladder. Subcentimeter hypodensities within both kidneys too small to further characterize. Stomach/Bowel: The stomach is nonenlarged. No dilated small bowel. No acute bowel wall thickening. Negative appendix. Mild diverticular disease of the left colon. Vascular/Lymphatic: Moderate aortic atherosclerosis. No aneurysm. No suspicious lymph nodes. Reproductive: Prostate is enlarged. Other: Negative for pelvic effusion or free air. Musculoskeletal: Degenerative changes.  No acute osseous abnormality IMPRESSION: 1. Negative for hydronephrosis. Mildly prominent left ureter without obstructing stone. Slightly thick-walled urinary bladder. 2. Cholelithiasis without inflammatory change in the right upper quadrant 3. Enlarged prostate Electronically Signed   By: Donavan Foil M.D.   On: 03/27/2022 20:21   CT Head Wo Contrast  Result Date: 03/27/2022 CLINICAL DATA:  Mental status change, unknown cause EXAM: CT HEAD WITHOUT CONTRAST TECHNIQUE: Contiguous axial images were obtained from the base of the skull through the  vertex without intravenous contrast. RADIATION DOSE REDUCTION: This exam was performed according to the departmental dose-optimization program which includes automated exposure control, adjustment of the mA and/or kV according to patient size and/or use of iterative reconstruction technique. COMPARISON:  08/16/2020 FINDINGS: Brain: No evidence of acute infarction, hemorrhage, hydrocephalus, extra-axial collection or mass lesion/mass  effect. Patchy low-density changes within the periventricular and subcortical white matter compatible with chronic microvascular ischemic change. Moderate diffuse cerebral volume loss. Vascular: Atherosclerotic calcifications involving the large vessels of the skull base. No unexpected hyperdense vessel. Skull: Normal. Negative for fracture or focal lesion. Sinuses/Orbits: No acute finding. Other: None. IMPRESSION: 1. No acute intracranial findings. 2. Chronic microvascular ischemic change and cerebral volume loss. Electronically Signed   By: Davina Poke D.O.   On: 03/27/2022 18:35   DG Chest Port 1 View  Result Date: 03/27/2022 CLINICAL DATA:  Altered mental status. Unresponsive. Question sepsis. EXAM: PORTABLE CHEST 1 VIEW COMPARISON:  None Available. FINDINGS: The heart size is normal. Lungs are clear. Lung volumes are low. Atherosclerotic changes are present at the aortic arch. The visualized soft tissues and bony thorax are unremarkable. IMPRESSION: 1. Low lung volumes. 2. No acute cardiopulmonary disease. Electronically Signed   By: San Morelle M.D.   On: 03/27/2022 17:34    Procedures Procedures    Medications Ordered in ED Medications  cefTRIAXone (ROCEPHIN) 1 g in sodium chloride 0.9 % 100 mL IVPB (0 g Intravenous Stopped 03/27/22 1838)  acetaminophen (TYLENOL) tablet 650 mg (has no administration in time range)    Or  acetaminophen (TYLENOL) suppository 650 mg (has no administration in time range)  dextrose 5 % solution (has no administration in  time range)  insulin aspart (novoLOG) injection 0-9 Units (has no administration in time range)  heparin ADULT infusion 100 units/mL (25000 units/234m) (has no administration in time range)  lactated ringers bolus 1,000 mL (0 mLs Intravenous Stopped 03/27/22 2211)    And  lactated ringers bolus 1,000 mL (0 mLs Intravenous Stopped 03/27/22 2003)  vancomycin (VANCOREADY) IVPB 1250 mg/250 mL (0 mg Intravenous Stopped 03/27/22 2048)  acetaminophen (TYLENOL) suppository 650 mg (650 mg Rectal Given 03/27/22 1808)    ED Course/ Medical Decision Making/ A&P                           Medical Decision Making Amount and/or Complexity of Data Reviewed Independent Historian: EMS Labs: ordered. Radiology: ordered and independent interpretation performed. ECG/medicine tests: ordered and independent interpretation performed.  Risk OTC drugs. Prescription drug management. Drug therapy requiring intensive monitoring for toxicity. Decision regarding hospitalization.   88year old male with a history as above presents today with concern for altered mental status.  On arrival, patient is ill-appearing, febrile to 101 and minimally responsive.  Initial emergent differential includes sepsis, toxic metabolic encephalopathy, polypharmacy, acute renal failure with possible urinary retention, rhabdomyolysis, CVA, intracranial hemorrhage, myxedema coma.  Full sepsis work-up was initiated on arrival.  I reviewed and interpreted the patient's labs which are concerning for leukocytosis of 20k and lactic acidosis, concerning for severe sepsis. He also has evidence of acute renal failure with creatinine 4, as well as severe hypernatremia at 163.  Suspect this is likely secondary to profound hypovolemia.  CK is also elevated at 1858 indicative of mild rhabdo. Troponin also is significantly elevated at 2634 -> 5593. EKG with evidence of precordial depressions, concerning for NSTEMI thus will plan to start on heparin.   However, though favor this to be demand in the setting of his severe illness.  He was empirically started on IV antibiotics and treated with 2 L of IV fluids for total of 3 L (previously received 1 L with EMS). UA was obtained and appears infected thus suspect likely etiology.  Chest x-ray was obtained which I reviewed  and negative for evidence of focal airspace consolidations that would support pneumonia.  No pulmonary edema, pneumothorax, or widened mediastinum.  CT head was also obtained and negative for large territory infarcts or intracranial hemorrhage.  CT abdomen pelvis without contrast was also obtained given his tenderness on exam.  No evidence of hydronephrosis or nephrolithiasis.  We did attempt to place Foley, unfortunately were unable to pass secondary to prior history of prostatomegaly and urethral strictures.  However, he is not acutely retaining and was able to void spontaneously.  On reevaluations, mental status had mildly improved.  I spoke with the inpatient medicine team who is agreeable to accept the patient.  No further acute events or interventions prior to transition of care to the inpatient team.  Final Clinical Impression(s) / ED Diagnoses Final diagnoses:  Hypernatremia  Dehydration  Metabolic encephalopathy  Acute renal failure, unspecified acute renal failure type [facility])  NSTEMI (non-ST elevated myocardial infarction) [facility])    Rx / [place] Orders ED Discharge Orders     None         Kadence Mikkelson, [place], MD 03/27/22 2214    Luna Fuse, MD 03/28/22 [phone]

## 2022-03-27 NOTE — H&P (Signed)
History and Physical    PLEASE NOTE THAT [employer]AG[employer] SOFTWARE WAS USED IN THE CONSTRUCTION OF THIS NOTE.   [name] BWI:[id number] DOB: [birth year] DOA: 03/27/2022  PCP: Forrest Moron, MD  Patient coming from: home   I have personally briefly reviewed patient's old medical records in [place]  Chief Complaint: Altered mental status  HPI: Sergio [name] is a 88 y.o. male with medical history significant for type 2 diabetes mellitus, essential hypertension, BPH, stage IIIb chronic kidney disease associated baseline creatinine range 6.3-8.4, chronic diastolic heart failure, who is admitted to [facility] on 03/27/2022 with acute hypernatremia after presenting from home to [facility] ED for evaluation of altered mental status.  In the setting of the patient's altered mental status, a history provided by the patient's family, in addition to my discussions with the EDP and via chart review.  Family conveys that over the course of the last week, that the patient has been progressively more confused, somnolent, and less interactive with the other family members, noting that the patient has been spending prolonged periods of time in his wheelchair at home relative to his baseline activity level.  No specific acute complaints conveyed that the patient to his family members over the course the last week.  Associated with decline in oral intake over that timeframe.  No recent trauma or travel.  In the setting of no ensuing improvement of the patient's altered mental status, the patient was subsequent brought to [facility] emergency department today for further evaluation and management thereof.  Medical history notable for a history of urinary retention in the setting of BPH, will also noting that he underwent urethral balloon dilation in July 2022 via Dr. Claudia Desanctis. It appears that he was previously on tamsulosin as an outpatient, although it appears that this medication was recently  discontinued.  He also has a history of stage IIIb CKD associated baseline creatinine range 1.4-1.6, with most recent prior serum creatinine data point noted to be 1.56 on 01/02/2022.  He has a history of chronic diastolic heart failure, with most recent echocardiogram in May 2018 notable for LVEF 55 to 60%, left ventricular cavity size normal, no evidence of wall motion abnormalities, grade 1 diastolic dysfunction, mildly dilated left atrium, normal right ventricular systolic function, and mild tricuspid regurgitation.  Not on a diuretic medications at home.      ED Course:  Vital signs in the ED were notable for the following: Temperature max 101; initial heart rate 97, which subsequently decreased to 84 following interval initiation of IV fluids; pressure 136/73 - 164/72 mmhg; respiratory rate 17-27, oxygen saturation 97 to 100% on room air.  Labs were notable for the following: CMP notable for sodium 163, which corrects to approximately 167, taking into account concomitant hyperglycemia which is relative to most recent prior serum sodium value of 142 on 01/02/2022, potassium 4.3, chloride 128, carbon 21, anion gap 14, creatinine 4.43, glucose 259, calcium, corrected for mild hypoalbuminemia noted to be 10.8, albumin 2.3, AST 78, otherwise liver enzymes found to be within normal limits.  CPK 1858.  High-sensitivity troponin I initially noted to be 2634, with repeat value trending up to 5593, which is relative to most recent prior high-sensitivity troponin I value of 11 in May 2022.  Initial lactate 4.7, with repeat value trending down to 3.5.  CBC notable for white blood cell count 20,000 with 92% neutrophils, hemoglobin 13.  Ammonia 53, TSH 4.841.  Urinalysis was associated with  a cloudy appearing specimen is notable for greater than 50 white blood cells, many bacteria, was positive for hyaline casts demonstrated 100 protein as well as moderate hemoglobin with greater than 50 red blood  cells.  COVID-19/influenza PCR negative.  Blood cultures and urine culture collected prior to initiation of IV antibiotics, as below.  Imaging and additional notable ED work-up: EKG, in comparison to most recent prior from 01/05/2022 showed sinus rhythm with heart rate 93, normal intervals, nonspecific to inversion in aVL, V2, V3, which is unchanged relative to most recent EKG on 01/05/2022, nonspecific ST depression in V3 through V5, also unchanged from most recent prior EKG, and no evidence of ST elevation.  Chest x-ray showed no evidence of acute cardiopulmonary process, including no evidence of infiltrate, edema, effusion, or pneumothorax.  Noncontrast CT head showed no evidence of acute intracranial process, including no evidence of intracranial hemorrhage nor any evidence of acute infarct.  CT abdomen/pelvis without contrast showed cholelithiasis without evidence of acute cholecystitis, nor any evidence of choledocholithiasis or dilation of the common bile duct.  CT abdomen/pelvis also showed evidence of enlarged prostate, but otherwise showed no evidence of acute intra-abdominal or acute intrapelvic process, including no evidence of obstructing ureteral stone nor any evidence of hydronephrosis, but did show evidence of slight thickening of the wall of the urinary bladder.  While in the ED, the following were administered: Acetaminophen 650 mg PR x1, Rocephin, IV vancomycin, lactated Ringer's x2 L bolus, initiation of heparin drip.  Subsequently, the patient was admitted for further evaluation and management of acute hypernatremia associated with acute kidney injury, severe sepsis due to suspected urinary tract infection, acute metabolic encephalopathy, suspected type II NSTEMI, and mild hypercalcemia.    Review of Systems: As per HPI otherwise 10 point review of systems negative.   Past Medical History:  Diagnosis Date   Colon polyps 06/22/2014   4 colon polyps by colonoscopy.  Repeat 3  years.  [place]   Diabetes mellitus    Hypertension    Other and unspecified hyperlipidemia    PE (pulmonary embolism)     Past Surgical History:  Procedure Laterality Date   COLONOSCOPY W/ POLYPECTOMY  06/22/2014   4 colon polyps; repeat 3 years; [place].   CYSTOSCOPY WITH URETHRAL DILATATION N/A 04/11/2021   Procedure: CYSTOSCOPY WITH URETHRAL  BALLOON DILATATION;  Surgeon: Robley Fries, MD;  Location: [facility];  Service: Urology;  Laterality: N/A;  43 MINS    Social History:  reports that he quit smoking about 46 years ago. His smoking use included cigars. He has never used smokeless tobacco. He reports that he does not drink alcohol and does not use drugs.   No Known Allergies  Family History  Problem Relation Age of Onset   Diabetes Other     Family history reviewed and not pertinent    Prior to Admission medications   Medication Sig Start Date End Date Taking? Authorizing Provider  amLODipine (NORVASC) 10 MG tablet Take 1 tablet (10 mg total) by mouth daily. Patient not taking: Reported on 03/27/2022 07/06/20 04/11/21  Antonieta Pert, MD  apixaban (ELIQUIS) 5 MG TABS tablet Take 1 tablet (5 mg total) by mouth 2 (two) times daily. Patient not taking: Reported on 03/27/2022 08/19/20   Dwyane Dee, MD  hydrALAZINE (APRESOLINE) 25 MG tablet Take 1 tablet (25 mg total) by mouth 3 (three) times daily. Patient not taking: Reported on 03/27/2022 07/06/20 08/29/20  Antonieta Pert, MD  ondansetron (ZOFRAN) 4 MG  tablet Take 1 tablet (4 mg total) by mouth every 8 (eight) hours as needed for nausea or vomiting. Patient not taking: Reported on 03/27/2022 01/21/20   Hayden Rasmussen, MD  tamsulosin (FLOMAX) 0.4 MG CAPS capsule Take 1 capsule (0.4 mg total) by mouth daily. Patient not taking: Reported on 03/27/2022 07/02/20   Alexis Frock, MD  traMADol (ULTRAM) 50 MG tablet Take 1 tablet (50 mg total) by mouth every 12 (twelve) hours as needed for moderate pain. Patient not  taking: Reported on 03/27/2022 01/03/22   Sherwood Gambler, MD     Objective    Physical Exam: Vitals:   03/27/22 2151 03/27/22 2200 03/27/22 2215 03/27/22 2226  BP:  (!) 181/70 (!) 164/74   Pulse:  97 85   Resp:  (!) 28 19   Temp:    98.7 F (37.1 C)  TempSrc:    Oral  SpO2:  99% 98%   Weight: 65.8 kg     Height: 5' 5"  (1.651 m)       General: appears to be stated age; somnolent, will briefly open eyes to verbal stimuli, unable to follow instructions at this time Skin: warm, dry, no rash Head:  AT/[place] Mouth:  Oral mucosa membranes appear dry, normal dentition Neck: supple; trachea midline Heart:  RRR; did not appreciate any M/R/G Lungs: CTAB, did not appreciate any wheezes, rales, or rhonchi Abdomen: + BS; soft, ND, NT Vascular: 2+ pedal pulses b/l; 2+ radial pulses b/l Extremities: no peripheral edema, no muscle wasting Neuro: In the setting of the patient's current mental status and associated inability to follow instructions, unable to perform full neurologic exam at this time.  As such, assessment of strength, sensation, and cranial nerves is limited at this time. Patient noted to spontaneously move all 4 extremities. No tremors.      Labs on Admission: I have personally reviewed following labs and imaging studies  CBC: Recent Labs  Lab 03/27/22 1737 03/27/22 1810  WBC 20.1*  --   NEUTROABS 18.3*  --   HGB 13.0 12.9*  HCT 42.4 38.0*  MCV 89.6  --   PLT 235  --    Basic Metabolic Panel: Recent Labs  Lab 03/27/22 1737 03/27/22 1810 03/27/22 2002  NA 163* 165*  --   K 4.3 4.1  --   CL 128*  --   --   CO2 21*  --   --   GLUCOSE 259*  --   --   BUN 78*  --   --   CREATININE 4.43*  --   --   CALCIUM 9.5  --   --   MG  --   --  2.6*   GFR: Estimated Creatinine Clearance: 10.2 mL/min (A) (by C-G formula based on SCr of 4.43 mg/dL (H)). Liver Function Tests: Recent Labs  Lab 03/27/22 1737  AST 78*  ALT 40  ALKPHOS 46  BILITOT 1.1  PROT 7.5  ALBUMIN  2.3*   No results for input(s): "LIPASE", "AMYLASE" in the last 168 hours. Recent Labs  Lab 03/27/22 1804  AMMONIA 53*   Coagulation Profile: Recent Labs  Lab 03/27/22 1737  INR 1.4*   Cardiac Enzymes: Recent Labs  Lab 03/27/22 1737  CKTOTAL 1,858*   BNP (last 3 results) No results for input(s): "PROBNP" in the last 8760 hours. HbA1C: No results for input(s): "HGBA1C" in the last 72 hours. CBG: Recent Labs  Lab 03/27/22 2208  GLUCAP 155*   Lipid Profile: No results for  input(s): "CHOL", "HDL", "LDLCALC", "TRIG", "CHOLHDL", "LDLDIRECT" in the last 72 hours. Thyroid Function Tests: Recent Labs    03/27/22 1804  TSH 4.841*   Anemia Panel: No results for input(s): "VITAMINB12", "FOLATE", "FERRITIN", "TIBC", "IRON", "RETICCTPCT" in the last 72 hours. Urine analysis:    Component Value Date/Time   COLORURINE AMBER (A) 03/27/2022 2030   APPEARANCEUR CLOUDY (A) 03/27/2022 2030   LABSPEC 1.013 03/27/2022 2030   PHURINE 8.0 03/27/2022 2030   GLUCOSEU NEGATIVE 03/27/2022 2030   HGBUR MODERATE (A) 03/27/2022 2030   BILIRUBINUR NEGATIVE 03/27/2022 2030   BILIRUBINUR negative 10/01/2017 0901   BILIRUBINUR neg 04/02/2014 1605   KETONESUR NEGATIVE 03/27/2022 2030   PROTEINUR 100 (A) 03/27/2022 2030   UROBILINOGEN 0.2 10/01/2017 0901   UROBILINOGEN 0.2 02/12/2015 2205   NITRITE NEGATIVE 03/27/2022 2030   LEUKOCYTESUR SMALL (A) 03/27/2022 2030    Radiological Exams on Admission: CT ABDOMEN PELVIS WO CONTRAST  Result Date: 03/27/2022 CLINICAL DATA:  Altered mental status EXAM: CT ABDOMEN AND PELVIS WITHOUT CONTRAST TECHNIQUE: Multidetector CT imaging of the abdomen and pelvis was performed following the standard protocol without IV contrast. RADIATION DOSE REDUCTION: This exam was performed according to the departmental dose-optimization program which includes automated exposure control, adjustment of the mA and/or kV according to patient size and/or use of iterative  reconstruction technique. COMPARISON:  CT 06/30/2020 FINDINGS: Lower chest: Lung bases demonstrate no acute consolidation or effusion. Hepatobiliary: Multiple gallstones. No focal hepatic abnormality or biliary dilatation Pancreas: Unremarkable. No pancreatic ductal dilatation or surrounding inflammatory changes. Spleen: Curvilinear splenic calcifications as before. Adrenals/Urinary Tract: Adrenals within normal limits. Kidneys show no hydronephrosis. Mildly prominent left ureter without obstructing stone. Slightly thick-walled appearance of the urinary bladder. Subcentimeter hypodensities within both kidneys too small to further characterize. Stomach/Bowel: The stomach is nonenlarged. No dilated small bowel. No acute bowel wall thickening. Negative appendix. Mild diverticular disease of the left colon. Vascular/Lymphatic: Moderate aortic atherosclerosis. No aneurysm. No suspicious lymph nodes. Reproductive: Prostate is enlarged. Other: Negative for pelvic effusion or free air. Musculoskeletal: Degenerative changes.  No acute osseous abnormality IMPRESSION: 1. Negative for hydronephrosis. Mildly prominent left ureter without obstructing stone. Slightly thick-walled urinary bladder. 2. Cholelithiasis without inflammatory change in the right upper quadrant 3. Enlarged prostate Electronically Signed   By: Donavan Foil M.D.   On: 03/27/2022 20:21   CT Head Wo Contrast  Result Date: 03/27/2022 CLINICAL DATA:  Mental status change, unknown cause EXAM: CT HEAD WITHOUT CONTRAST TECHNIQUE: Contiguous axial images were obtained from the base of the skull through the vertex without intravenous contrast. RADIATION DOSE REDUCTION: This exam was performed according to the departmental dose-optimization program which includes automated exposure control, adjustment of the mA and/or kV according to patient size and/or use of iterative reconstruction technique. COMPARISON:  08/16/2020 FINDINGS: Brain: No evidence of acute  infarction, hemorrhage, hydrocephalus, extra-axial collection or mass lesion/mass effect. Patchy low-density changes within the periventricular and subcortical white matter compatible with chronic microvascular ischemic change. Moderate diffuse cerebral volume loss. Vascular: Atherosclerotic calcifications involving the large vessels of the skull base. No unexpected hyperdense vessel. Skull: Normal. Negative for fracture or focal lesion. Sinuses/Orbits: No acute finding. Other: None. IMPRESSION: 1. No acute intracranial findings. 2. Chronic microvascular ischemic change and cerebral volume loss. Electronically Signed   By: Davina Poke D.O.   On: 03/27/2022 18:35   DG Chest Port 1 View  Result Date: 03/27/2022 CLINICAL DATA:  Altered mental status. Unresponsive. Question sepsis. EXAM: PORTABLE CHEST 1 VIEW COMPARISON:  None Available. FINDINGS: The heart size is normal. Lungs are clear. Lung volumes are low. Atherosclerotic changes are present at the aortic arch. The visualized soft tissues and bony thorax are unremarkable. IMPRESSION: 1. Low lung volumes. 2. No acute cardiopulmonary disease. Electronically Signed   By: San Morelle M.D.   On: 03/27/2022 17:34     EKG: Independently reviewed, with result as described above.    Assessment/Plan   Principal Problem:   Hypernatremia Active Problems:   DM2 (diabetes mellitus, type 2) (HCC)   Acute renal failure superimposed on stage 3b chronic kidney disease (HCC)   Severe sepsis (HCC)   Acute cystitis   Acute metabolic encephalopathy   Acute on chronic urinary retention   NSTEMI (non-ST elevated myocardial infarction) (HCC)   Hypercalcemia   Chronic diastolic CHF (congestive heart failure) ([place])      #) Acute Hypovolemic hypernatremia: Presenting corrected serum sodium level of 165 compared to most recent prior value of 142 in April 2023.  Suggestive of free water deficiency in the context of dehydration stemming from family's  report of the patient exhibiting evidence of significant decline in oral intake of both food and water over the course of the last week.  Will further evaluate the urine studies, as detailed below.  No reported lithium use.  Unable to perform full neurologic evaluation at this time, in the context of the patient's presenting acute encephalopathy and associated limited ability to follow instructions at this time.  However is noted to spontaneously move all 4 extremities, and presenting CT head shows no evidence of acute intracranial process, as above.  Of note, per most recent documented body weight of 63 kg, total body water deficit calculated to be 5.6 L.  As the patient has now received 2 L LR bolus in the emergency department this evening, will plan to correct, with D5W, half of the patient's total body water deficit, which equates to 2800 cc, over the ensuing 24 hours, which would correlate with with the rate of D5W at 116.7 cc/h.  will conservatively reduce this rate to 100 cc/h, and closely monitor ensuing serum sodium trend, monitoring for evidence of rapid correction, as well as for evidence of ensuing volume overload in the context of a history of chronic diastolic heart failure, as further detailed below.    Plan: Check random urine sodium, random urine creatinine, urine osmolality, serum osmolality.  Monitor strict I's and O's and daily weights.  Start D5W at 100 cc/h x 24 hours, with every 4 hours BMPs ordered through 9 AM on 03/28/2022 for closer full trending in serum sodium level.  Every 4 hours neurochecks.  Add on free T4.         #) Acute kidney injury superimposed on stage IIIb CKD: In the context of a documented history of CKD 3B with baseline creatinine range of 1.4-1.6, presenting serum creatinine noted to be 4.43.  Likely multifactorial in nature,, with multiple prerenal contributions that include dehydration in the setting of recent decline in oral intake, as well as additional  contribution towards intravascular depletion in the setting of presenting severe sepsis due to urinary tract infection.  Urinalysis with microscopy notable for 100 protein, greater than 50 red blood cells, and was also noted to be positive for hyaline cast, with the latter consistent with a picture of dehydration.  Potential additional contribution from mildly elevated CPK level, although this degree of elevation is not on par with acute rhabdomyolysis at this time.  Of note,  per my discussions with EDP, Foley catheter currently being placed in the setting of evidence of urinary retention.  Plan: IV fluids, as above.  Monitor strict I's and O's and daily weights.  Tempt avoid nephrotoxic agents.  Add on random urine sodium as well as random urine creatinine.  Check random urine protein to creatinine ratio given the presence of 100 protein in today's urinalysis.  We will monitor ensuing renal function trend via aforementioned every 4 hours BMPs through 9 AM tomorrow, as further detailed above.  Repeat CPK in the morning.           #) Severe sepsis due to urinary tract infection: In the setting of presenting objective fever of 101, with tachypnea, leukocytosis with neutrophilic predominance, and presenting urinalysis suggestive of urinary tract infection in the setting of cloudy appearing specimen with significant pyuria,  many bacteria.  Lactic acid level: Initially 4.7, with repeat value trending down to 3.5. Of note, given the associated presence of suspected end organ damage in the form of concominant presenting elevated lactate as well as acute kidney injury on CKD 3B in addition to presenting acute metabolic encephalopathy, criteria are met for pt's sepsis to be considered severe in nature.  In the setting of initial lactate greater than 4, criteria also met for septic shock, although the patient has been hemodynamically stable without any evidence of hypotension throughout ED course.  In the  setting of elevated lactate greater than 4.0, there is an indication for receipt of a 30 mL/kg IVF bolus, which by most recent documented weight of 63.3 kg, has been met via administration of 2 L LR bolus received in the ED this evening.  Additional ED work-up/management notable for: Blood cultures x2 and urine culture collected prior to initiation of IV Rocephin.  Additionally, while urinalysis was pending, he also received a dose of IV vancomycin.   No e/o additional infectious process at this time, including chest x-ray shows no evidence of acute cardiopulmonary process, including no evidence of infiltrate.  COVID-19/influenza PCR negative.  No evidence of significant acute transaminitis.  Also, CT abdomen/pelvis, aside from showing evidence of mild bladder wall thickening, consistent with suspected acute cystitis, no additional evidence of acute intra-abdominal/intrapelvic process.    Plan: CBC w/ diff and CMP in AM.  Follow for results of blood cx's x 2 and urine culture. Abx: Continue Rocephin.  IV fluids, as above.  Repeat lactate ordered for 1 AM.  Add on procalcitonin level.          #) Elevated CPK level: Presenting value approximately 1800, without evidence of myoglobinuria on presenting urinalysis.  Presentation does not appear consistent with overt acute rhabdomyolysis at this time, in the absence of evidence of myoglobinuria as well as given only mild elevation of the patient's CPK level.  However, he is at increased risk for ensuing development of such, given Fields report significantly diminished ambulatory status over the course of the last week.  Proceed with IV fluids, as further detailed below, as well as ensuing trending of CPK level, as below  Plan: IV fluids, as above.  Repeat CPK level in the morning.  Close monitoring of ensuing renal function, including close monitoring of results of every 4 hours BMPs ordered primarily for close trending of serum serum sodium level.   Monitor strict I's and O's and weights.        #) Acute metabolic encephalopathy: Progressive confusion/somnolence over the course of the last week, which appears multifactorial in nature,  with metabolic contributions from severe sepsis due to UTI as well as acute hyponatremia, dehydration, acute kidney injury and mild hypercalcemia.  No evidence of overt seizures at this time.  While unable to perform complete neurologic assessment at this time, patient appears to be spontaneously moving all 4 extremities.  Overall, acute CVA felt to be less likely at this time, but will continue to closely monitor few every 4 hours neurochecks.  Of note, CT head showed no evidence of acute intracranial process, as further detailed above.  Presenting TSH mildly elevated.  We will add on free T4 to assist with further clarification of this result.  Presenting VBG demonstrates no evidence to suggest hypercapnic encephalopathy.  Additionally, ammonia level found to be 53.  Plan: Further evaluation management of severe sepsis due to UTI, as above, including continuation of IV Rocephin other evaluation management of acute hyponatremia, clinically and via IV fluids, as conveyed above, as well as further evaluation management AKI on CKD 3B, as above.  Add on free T4 repeat CMP and CBC in the morning.  Check urinary drug screen.  Fall precautions ordered.        #) History of urinary retention: Documented history of such, including documented history of BPH for which the patient was previously on tamsulosin as well as per chart review revealing a history of urethral balloon dilation in July 2022.  Foley catheter being placed in the ED this evening, provide discussions with the EDP given report of evidence of urinary retention this evening.  Notable in the context of acute kidney injury superimposed on CKD 3B, raising possibility of postrenal contribution to interval worsening in renal function in addition to the  previously discussed prerenal contributions from dehydration, as well as severe sepsis.   Plan: Monitor strict I's and O's and daily weights.  IV fluids, as above.  Repeat CMP in the morning.  Reinitiated tamsulosin, which was reportedly recently discontinued as an outpatient.         #) Hypercalcemia: Presenting labs reflect serum calcium, corrected for mild hypoalbuminemia, to be 10.8.  Suspect element of dehydration, without overt pharmacologic contribution. Will proceed with IVF's, as above, with repeat calcium level in the morning, with consideration for further expansion of work-up if no ensuing improvement in serum calcium level following interval IVF administration.  Potential secondary contribution towards the patient's presenting acute metabolic encephalopathy.     Plan: IV fluids, as above.  Monitor strict I's&O's, daily weights.  CMP in the morning.  Check serum Mg and Phos levels.            #) Type 2 NSTEMI: elevated initial troponin of 2600, with repeat value trending up to the 5600, relative to most recent prior value of 11 when checked in May 2022.  Presenting EKG shows no evidence of acute T wave or ST changes relative to most recent prior EKG from April 2023, including no evidence of ST elevation, as further detailed above. suspect that this elevated troponin is on the basis of supply demand mismatch in the setting of severe sepsis due to urinary tract infection, significant dehydration, as well as significant decline in renal clearance as a consequence of AKI on CKD 3B, as opposed to representing a type I process due to acute plaque rupture.   CXR showed no acute CP process, including no evidence of pneumothorax.  Overall, ACS is felt to be less likely relative to type 2 supply demand mismatch, as above, but will closely monitor on telemetry  overnight while treating suspected underlying severe sepsis, dehydration, AKI on CKD 3B, as further described above, with plan to  pursue echocardiogram in the morning.  Of note, heparin drip initiated in the ED this evening.   Plan: repeat troponin in the AM. Monitor on telemetry. PRN EKG for development of chest pain. Check serum Mg level and repeat CMP in the morning.  Repeat CBC in the AM. Additional evaluation and management of presenting severe sepsis, UTI, dehydration, AKI on CKD 3 as suspected driving force behind mildly elevated troponin, as above.  Echo ordered for the morning.  Continue heparin drip.          #) Type 2 Diabetes Mellitus: documented history of such.  Appears to be managed via lifestyle modifications, in the absence of any exogenous insulin or oral hypoglycemic agents as an outpatient.  Most recent hemoglobin A1c noted to be 6.7% when checked in October 2021.  Presenting blood sugar noted to be 259, with likely relative hypoglycemia contribution in the setting of significant dehydration in addition to physiologic stress in the setting of concomitant severe sepsis.  Particularly given and plan for IV fluids via D5 W setting of hypernatremia, will closely monitor ensuing blood sugars via routine CBG monitoring, solid blood.  Plan: accuchecks QAC and HS with low dose SSI.  Add on hemoglobin A1c level.            #) Chronic diastolic heart failure: documented history of such, with most recent echocardiogram performed in May 2018 notable for LVEF 55 to 60% as well as grade 1 diastolic dysfunction, with additional details as conveyed above. No clinical or radiographic evidence to suggest acutely decompensated heart failure at this time.  Does not appear to be on any scheduled diuretic medications as an outpatient.  We will closely monitor volume status for evidence of ensuing acute volume overload given plan for additional IV fluids in the setting of suspected hypovolemic hypernatremia, as above.    Plan: monitor strict I's & O's and daily weights. Repeat BMP in AM. Check serum mag level.  Add  on BNP.  Will follow for results of echocardiogram in the morning, as above.      DVT prophylaxis: SCD's + heparin drip Code Status: Full code Disposition Plan: Per Rounding Team Consults called: none;  Admission status: Inpatient; PCU   PLEASE NOTE THAT DRAGON DICTATION SOFTWARE WAS USED IN THE CONSTRUCTION OF THIS NOTE.   Seligman DO [facility]  From [place]   03/27/2022, 10:26 PM

## 2022-03-27 NOTE — Sepsis Progress Note (Signed)
Elink is monitoring code sepsis

## 2022-03-27 NOTE — ED Triage Notes (Signed)
Pt bib ems from home; called out by son who says pt has been altered week; lsn last Thursday; unresponsive initially on ems arrival; minimally responsive to voice at present; ems reporst pt has been sitting in a wheelchair x 1 week; typically a and o x 4 per family; hx dm, cbg 361; multiple wounds noted on legs, arms, shoulders, bilateral hips; O2 98 % RA; RR 60 initially; pt noted to lean to the left, L facial droop ; family states leaning normal; 140/82, P 105, capnography 22; 18 LFA; 1 L LR given pta

## 2022-03-27 NOTE — Assessment & Plan Note (Signed)
 #)   Acute kidney injury superimposed on stage IIIb CKD: In the context of a documented history of CKD 3B with baseline creatinine range of 1.4-1.6, presenting serum creatinine noted to be 4.43.  Likely multifactorial in nature,, with multiple prerenal contributions that include dehydration in the setting of recent decline in oral intake, as well as additional contribution towards intravascular depletion in the setting of presenting severe sepsis due to urinary tract infection.  Urinalysis with microscopy notable for 100 protein, greater than 50 red blood cells, and was also noted to be positive for hyaline cast, with the latter consistent with a picture of dehydration.  Potential additional contribution from mildly elevated CPK level, although this degree of elevation is not on par with acute rhabdomyolysis at this time.  Of note, per my discussions with EDP, Foley catheter currently being placed in the setting of evidence of urinary retention.  Plan: IV fluids, as above.  Monitor strict I's and O's and daily weights.  Tempt avoid nephrotoxic agents.  Add on random urine sodium as well as random urine creatinine.  Check random urine protein to creatinine ratio given the presence of 100 protein in today's urinalysis.  We will monitor ensuing renal function trend via aforementioned every 4 hours BMPs through 9 AM tomorrow, as further detailed above.  Repeat CPK in the morning.

## 2022-03-27 NOTE — Assessment & Plan Note (Signed)
  #)   Type 2 Diabetes Mellitus: documented history of such.  Appears to be managed via lifestyle modifications, in the absence of any exogenous insulin or oral hypoglycemic agents as an outpatient.  Most recent hemoglobin A1c noted to be 6.7% when checked in October 2021.  Presenting blood sugar noted to be 259, with likely relative hypoglycemia contribution in the setting of significant dehydration in addition to physiologic stress in the setting of concomitant severe sepsis.  Particularly given and plan for IV fluids via D5 W setting of hypernatremia, will closely monitor ensuing blood sugars via routine CBG monitoring, solid blood.  Plan: accuchecks QAC and HS with low dose SSI.  Add on hemoglobin A1c level.

## 2022-03-27 NOTE — Assessment & Plan Note (Signed)
  #)   Severe sepsis due to urinary tract infection: In the setting of presenting objective fever of 101, with tachypnea, leukocytosis with neutrophilic predominance, and presenting urinalysis suggestive of urinary tract infection in the setting of cloudy appearing specimen with significant pyuria,  many bacteria.  Lactic acid level: Initially 4.7, with repeat value trending down to 3.5. Of note, given the associated presence of suspected end organ damage in the form of concominant presenting elevated lactate as well as acute kidney injury on CKD 3B in addition to presenting acute metabolic encephalopathy, criteria are met for pt's sepsis to be considered severe in nature.  In the setting of initial lactate greater than 4, criteria also met for septic shock, although the patient has been hemodynamically stable without any evidence of hypotension throughout ED course.  In the setting of elevated lactate greater than 4.0, there is an indication for receipt of a 30 mL/kg IVF bolus, which by most recent documented weight of 63.3 kg, has been met via administration of 2 L LR bolus received in the ED this evening.  Additional ED work-up/management notable for: Blood cultures x2 and urine culture collected prior to initiation of IV Rocephin.  Additionally, while urinalysis was pending, he also received a dose of IV vancomycin.   No e/o additional infectious process at this time, including chest x-ray shows no evidence of acute cardiopulmonary process, including no evidence of infiltrate.  COVID-19/influenza PCR negative.  No evidence of significant acute transaminitis.  Also, CT abdomen/pelvis, aside from showing evidence of mild bladder wall thickening, consistent with suspected acute cystitis, no additional evidence of acute intra-abdominal/intrapelvic process.    Plan: CBC w/ diff and CMP in AM.  Follow for results of blood cx's x 2 and urine culture. Abx: Continue Rocephin.  IV fluids, as above.  Repeat  lactate ordered for 1 AM.  Add on procalcitonin level.

## 2022-03-27 NOTE — Assessment & Plan Note (Signed)
 #)   Type 2 NSTEMI: elevated initial troponin of 2600, with repeat value trending up to the 5600, relative to most recent prior value of 11 when checked in May 2022.  Presenting EKG shows no evidence of acute T wave or ST changes relative to most recent prior EKG from April 2023, including no evidence of ST elevation, as further detailed above. suspect that this elevated troponin is on the basis of supply demand mismatch in the setting of severe sepsis due to urinary tract infection, significant dehydration, as well as significant decline in renal clearance as a consequence of AKI on CKD 3B, as opposed to representing a type I process due to acute plaque rupture.   CXR showed no acute CP process, including no evidence of pneumothorax.  Overall, ACS is felt to be less likely relative to type 2 supply demand mismatch, as above, but will closely monitor on telemetry overnight while treating suspected underlying severe sepsis, dehydration, AKI on CKD 3B, as further described above, with plan to pursue echocardiogram in the morning.  Of note, heparin drip initiated in the ED this evening.   Plan: repeat troponin in the AM. Monitor on telemetry. PRN EKG for development of chest pain. Check serum Mg level and repeat CMP in the morning.  Repeat CBC in the AM. Additional evaluation and management of presenting severe sepsis, UTI, dehydration, AKI on CKD 3 as suspected driving force behind mildly elevated troponin, as above.  Echo ordered for the morning.  Continue heparin drip.

## 2022-03-27 NOTE — ED Notes (Signed)
Foley insertion attempted by 2 RNs, unsuccessful; MD notified

## 2022-03-27 NOTE — ED Notes (Signed)
Patient transported to CT 

## 2022-03-27 NOTE — Assessment & Plan Note (Signed)
#)   Acute Hypovolemic hypernatremia: Presenting corrected serum sodium level of 165 compared to most recent prior value of 142 in April 2023.  Suggestive of free water deficiency in the context of dehydration stemming from family's report of the patient exhibiting evidence of significant decline in oral intake of both food and water over the course of the last week.  Will further evaluate the urine studies, as detailed below.  No reported lithium use.  Unable to perform full neurologic evaluation at this time, in the context of the patient's presenting acute encephalopathy and associated limited ability to follow instructions at this time.  However is noted to spontaneously move all 4 extremities, and presenting CT head shows no evidence of acute intracranial process, as above.  Of note, per most recent documented body weight of 63 kg, total body water deficit calculated to be 5.6 L.  As the patient has now received 2 L LR bolus in the emergency department this evening, will plan to correct, with D5W, half of the patient's total body water deficit, which equates to 2800 cc, over the ensuing 24 hours, which would correlate with with the rate of D5W at 116.7 cc/h.  will conservatively reduce this rate to 100 cc/h, and closely monitor ensuing serum sodium trend, monitoring for evidence of rapid correction, as well as for evidence of ensuing volume overload in the context of a history of chronic diastolic heart failure, as further detailed below.    Plan: Check random urine sodium, random urine creatinine, urine osmolality, serum osmolality.  Monitor strict I's and O's and daily weights.  Start D5W at 100 cc/h x 24 hours, with every 4 hours BMPs ordered through 9 AM on 03/28/2022 for closer full trending in serum sodium level.  Every 4 hours neurochecks.  Add on free T4.

## 2022-03-27 NOTE — Assessment & Plan Note (Signed)
(  please see severe sepsis) 

## 2022-03-27 NOTE — ED Notes (Signed)
Pt transported to CT ?

## 2022-03-28 ENCOUNTER — Inpatient Hospital Stay (HOSPITAL_COMMUNITY): Payer: PPO

## 2022-03-28 DIAGNOSIS — I214 Non-ST elevation (NSTEMI) myocardial infarction: Secondary | ICD-10-CM

## 2022-03-28 DIAGNOSIS — L8990 Pressure ulcer of unspecified site, unspecified stage: Secondary | ICD-10-CM | POA: Insufficient documentation

## 2022-03-28 DIAGNOSIS — E870 Hyperosmolality and hypernatremia: Secondary | ICD-10-CM | POA: Diagnosis not present

## 2022-03-28 LAB — BASIC METABOLIC PANEL
Anion gap: 9 (ref 5–15)
Anion gap: 10 (ref 5–15)
BUN: 64 mg/dL — ABNORMAL HIGH (ref 8–23)
BUN: 58 mg/dL — ABNORMAL HIGH (ref 8–23)
CO2: 25 mmol/L (ref 22–32)
CO2: 23 mmol/L (ref 22–32)
Calcium: 8.6 mg/dL — ABNORMAL LOW (ref 8.9–10.3)
Calcium: 8.5 mg/dL — ABNORMAL LOW (ref 8.9–10.3)
Chloride: 125 mmol/L — ABNORMAL HIGH (ref 98–111)
Chloride: 122 mmol/L — ABNORMAL HIGH (ref 98–111)
Creatinine, Ser: 3.03 mg/dL — ABNORMAL HIGH (ref 0.61–1.24)
Creatinine, Ser: 2.6 mg/dL — ABNORMAL HIGH (ref 0.61–1.24)
GFR, Estimated: 19 mL/min — ABNORMAL LOW (ref >60)
GFR, Estimated: 23 mL/min — ABNORMAL LOW (ref >60)
Glucose, Bld: 177 mg/dL — ABNORMAL HIGH (ref 70–99)
Glucose, Bld: 204 mg/dL — ABNORMAL HIGH (ref 70–99)
Potassium: 3.9 mmol/L (ref 3.5–5.1)
Potassium: 3.5 mmol/L (ref 3.5–5.1)
Sodium: 159 mmol/L — ABNORMAL HIGH (ref 135–145)
Sodium: 155 mmol/L — ABNORMAL HIGH (ref 135–145)

## 2022-03-28 LAB — RAPID URINE DRUG SCREEN, HOSP PERFORMED
Amphetamines: NOT DETECTED
Barbiturates: NOT DETECTED
Benzodiazepines: NOT DETECTED
Cocaine: NOT DETECTED
Opiates: NOT DETECTED
Tetrahydrocannabinol: NOT DETECTED

## 2022-03-28 LAB — CBC WITH DIFFERENTIAL/PLATELET
Abs Immature Granulocytes: 0.09 10*3/uL — ABNORMAL HIGH (ref 0.00–0.07)
Basophils Absolute: 0.1 10*3/uL (ref 0.0–0.1)
Basophils Relative: 0 %
Eosinophils Absolute: 0.1 10*3/uL (ref 0.0–0.5)
Eosinophils Relative: 1 %
HCT: 38.2 % — ABNORMAL LOW (ref 39.0–52.0)
Hemoglobin: 12 g/dL — ABNORMAL LOW (ref 13.0–17.0)
Immature Granulocytes: 0 %
Lymphocytes Relative: 11 %
Lymphs Abs: 2.2 10*3/uL (ref 0.7–4.0)
MCH: 28 pg (ref 26.0–34.0)
MCHC: 31.4 g/dL (ref 30.0–36.0)
MCV: 89 fL (ref 80.0–100.0)
Monocytes Absolute: 0.5 10*3/uL (ref 0.1–1.0)
Monocytes Relative: 2 %
Neutro Abs: 17.5 10*3/uL — ABNORMAL HIGH (ref 1.7–7.7)
Neutrophils Relative %: 86 %
Platelets: 173 10*3/uL (ref 150–400)
RBC: 4.29 MIL/uL (ref 4.22–5.81)
RDW: 15.2 % (ref 11.5–15.5)
WBC: 20.4 10*3/uL — ABNORMAL HIGH (ref 4.0–10.5)
nRBC: 0 % (ref 0.0–0.2)

## 2022-03-28 LAB — COMPREHENSIVE METABOLIC PANEL
ALT: 77 U/L — ABNORMAL HIGH (ref 0–44)
AST: 136 U/L — ABNORMAL HIGH (ref 15–41)
Albumin: 2 g/dL — ABNORMAL LOW (ref 3.5–5.0)
Alkaline Phosphatase: 47 U/L (ref 38–126)
Anion gap: 8 (ref 5–15)
BUN: 66 mg/dL — ABNORMAL HIGH (ref 8–23)
CO2: 25 mmol/L (ref 22–32)
Calcium: 8.7 mg/dL — ABNORMAL LOW (ref 8.9–10.3)
Chloride: 125 mmol/L — ABNORMAL HIGH (ref 98–111)
Creatinine, Ser: 3.06 mg/dL — ABNORMAL HIGH (ref 0.61–1.24)
GFR, Estimated: 19 mL/min — ABNORMAL LOW (ref >60)
Glucose, Bld: 184 mg/dL — ABNORMAL HIGH (ref 70–99)
Potassium: 3.7 mmol/L (ref 3.5–5.1)
Sodium: 158 mmol/L — ABNORMAL HIGH (ref 135–145)
Total Bilirubin: 0.9 mg/dL (ref 0.3–1.2)
Total Protein: 6.6 g/dL (ref 6.5–8.1)

## 2022-03-28 LAB — PROCALCITONIN: Procalcitonin: 8.25 ng/mL

## 2022-03-28 LAB — CBG MONITORING, ED
Glucose-Capillary: 149 mg/dL — ABNORMAL HIGH (ref 70–99)
Glucose-Capillary: 104 mg/dL — ABNORMAL HIGH (ref 70–99)

## 2022-03-28 LAB — ECHOCARDIOGRAM COMPLETE
Area-P 1/2: 2.48 cm2
Height: 65 in
S' Lateral: 2.4 cm
Weight: 2320 oz

## 2022-03-28 LAB — TROPONIN I (HIGH SENSITIVITY)
Troponin I (High Sensitivity): 14045 ng/L (ref <18)
Troponin I (High Sensitivity): 5816 ng/L (ref <18)

## 2022-03-28 LAB — BRAIN NATRIURETIC PEPTIDE: B Natriuretic Peptide: 190.4 pg/mL — ABNORMAL HIGH (ref 0.0–100.0)

## 2022-03-28 LAB — GLUCOSE, CAPILLARY
Glucose-Capillary: 203 mg/dL — ABNORMAL HIGH (ref 70–99)
Glucose-Capillary: 125 mg/dL — ABNORMAL HIGH (ref 70–99)

## 2022-03-28 LAB — T4, FREE: Free T4: 1.01 ng/dL (ref 0.61–1.12)

## 2022-03-28 LAB — PROTEIN / CREATININE RATIO, URINE
Creatinine, Urine: 68.77 mg/dL
Protein Creatinine Ratio: 1.79 mg/mg{Cre} — ABNORMAL HIGH (ref 0.00–0.15)
Total Protein, Urine: 123 mg/dL

## 2022-03-28 LAB — OSMOLALITY, URINE: Osmolality, Ur: 497 mOsm/kg (ref 300–900)

## 2022-03-28 LAB — OSMOLALITY: Osmolality: 383 mOsm/kg (ref 275–295)

## 2022-03-28 LAB — PHOSPHORUS: Phosphorus: 3.3 mg/dL (ref 2.5–4.6)

## 2022-03-28 LAB — HEPARIN LEVEL (UNFRACTIONATED)
Heparin Unfractionated: 0.34 IU/mL (ref 0.30–0.70)
Heparin Unfractionated: 0.41 IU/mL (ref 0.30–0.70)

## 2022-03-28 LAB — LACTIC ACID, PLASMA
Lactic Acid, Venous: 1.5 mmol/L (ref 0.5–1.9)
Lactic Acid, Venous: 1.5 mmol/L (ref 0.5–1.9)
Lactic Acid, Venous: 1.9 mmol/L (ref 0.5–1.9)

## 2022-03-28 LAB — MAGNESIUM: Magnesium: 2.6 mg/dL — ABNORMAL HIGH (ref 1.7–2.4)

## 2022-03-28 LAB — CK: Total CK: 1844 U/L — ABNORMAL HIGH (ref 49–397)

## 2022-03-28 LAB — SODIUM, URINE, RANDOM: Sodium, Ur: 49 mmol/L

## 2022-03-28 MED ORDER — POTASSIUM CHLORIDE 10 MEQ/100ML IV SOLN
10.0000 meq | INTRAVENOUS | Status: AC
  Administered 2022-03-28 (×2): 10 meq via INTRAVENOUS
  Filled 2022-03-28 (×2): qty 100

## 2022-03-28 NOTE — ED Notes (Signed)
Dr. Velia Meyer notified about the foley attempts earlier in the day

## 2022-03-28 NOTE — Progress Notes (Signed)
PROGRESS NOTE    [name]  XBD:[id number] DOB: 1933/09/16 DOA: 03/27/2022 PCP: Forrest Moron, MD    Brief Narrative:  Sergio [name] is a 88 y.o. male with medical history significant for type 2 diabetes mellitus, essential hypertension, BPH, stage IIIb chronic kidney disease associated baseline creatinine range 8.3-4.1, chronic diastolic heart failure, who is admitted to [facility] on 03/27/2022 with acute hypernatremia after presenting from home to [facility] ED for evaluation of altered mental status.   Assessment and Plan: * Hypernatremia -Acute Hypovolemic hypernatremia: -Suggestive of free water deficiency in the context of dehydration stemming from family's report of the patient exhibiting evidence of significant decline in oral intake of both food and water over the course of the last week.   -total body water deficit calculated to be 5.6 L.   -plan to correct, with D5W, half of the patient's total body water deficit, which equates to 2800 cc, over the ensuing 24 hours -BMP 9 and 4   chronic diastolic CHF (congestive heart failure) ([place]) -most recent echocardiogram performed in May 2018 notable for LVEF 55 to 60% as well as grade 1 diastolic dysfunction, -No clinical or radiographic evidence to suggest acutely decompensated heart failure at this time. - monitor strict I's & O's and daily weights. -repeat echo    Hypercalcemia -Suspect element of dehydration, without overt pharmacologic contribution. -IVF -trend     NSTEMI (non-ST elevated myocardial infarction) ([place]) -Type 2 NSTEMI: - Presenting EKG shows no evidence of acute T wave or ST changes relative to most recent prior EKG from April 2023, including no evidence of ST elevation, as further detailed above. suspect that this elevated troponin is on the basis of supply demand mismatch in the setting of severe sepsis due to urinary tract infection, significant dehydration, as well as significant decline in  renal clearance as a consequence of AKI on CKD 3B, as opposed to representing a type I process due to acute plaque rupture.  -heparin gtt x 3 days -echo pending -will hold on cards consult as not sure he would be a candidate for intervention based on Cr/other co-morbidities    Acute on chronic urinary retention - History of urinary retention: Documented history of such, including documented history of BPH for which the patient was previously on tamsulosin as well as per chart review revealing a history of urethral balloon dilation in July 2022.   -foley placed in ER   Acute metabolic encephalopathy - Progressive confusion/somnolence over the course of the last week, which appears multifactorial in nature, with metabolic contributions from severe sepsis due to UTI as well as acute hyponatremia, dehydration, acute kidney injury and mild hypercalcemia.     Severe sepsis ([place]) - Severe sepsis due to urinary tract infection: - Blood cultures x2  -urine culture collected prior to initiation of IV Rocephin -CT abdomen/pelvis, aside from showing evidence of mild bladder wall thickening, consistent with suspected acute cystitis, no additional evidence of acute intra-abdominal/intrapelvic process.   Acute renal failure superimposed on stage 3b chronic kidney disease (HCC) -IV fluids, as above.   -Monitor strict I's and O's and daily weights.   -avoid nephrotoxic agents.       DM2 (diabetes mellitus, type 2) (HCC) - accuchecks QAC and HS with low dose SSI.   - hemoglobin A1c level.     DVT prophylaxis: SCDs Start: 03/27/22 2044    Code Status: Full Code   Disposition Plan:  Level of care: Progressive Status is: Inpatient  Remains inpatient appropriate because: needs correction of Na    Consultants:  none   Subjective: Sleepy, no vocal complaints- would open eyes to voice  Objective: Vitals:   03/28/22 0417 03/28/22 0500 03/28/22 0515 03/28/22 0800  BP: (!) 148/78   (!)  155/70  Pulse: 84 76 78 68  Resp: '18 19 16 16  '$ Temp:      TempSrc:      SpO2: 100% 100% 100% 100%  Weight:      Height:        Intake/Output Summary (Last 24 hours) at 03/28/2022 0818 Last data filed at 03/27/2022 2211 Gross per 24 hour  Intake 2000 ml  Output --  Net 2000 ml   Filed Weights   03/27/22 2151  Weight: 65.8 kg    Examination:   General: Appearance:    elderly male in no acute distress     Lungs:     respirations unlabored, laying flat  Heart:    Normal heart rate. Normal rhythm. No murmurs, rubs, or gallops.    MS:   All extremities are intact.    Neurologic:   sleeping       Data Reviewed: I have personally reviewed following labs and imaging studies  CBC: Recent Labs  Lab 03/27/22 1737 03/27/22 1810 03/28/22 0515  WBC 20.1*  --  20.4*  NEUTROABS 18.3*  --  17.5*  HGB 13.0 12.9* 12.0*  HCT 42.4 38.0* 38.2*  MCV 89.6  --  89.0  PLT 235  --  130   Basic Metabolic Panel: Recent Labs  Lab 03/27/22 1737 03/27/22 1810 03/27/22 2002 03/28/22 0515  NA 163* 165*  --  158*  K 4.3 4.1  --  3.7  CL 128*  --   --  125*  CO2 21*  --   --  25  GLUCOSE 259*  --   --  184*  BUN 78*  --   --  66*  CREATININE 4.43*  --   --  3.06*  CALCIUM 9.5  --   --  8.7*  MG  --   --  2.6* 2.6*  PHOS  --   --   --  3.3   GFR: Estimated Creatinine Clearance: 14.8 mL/min (A) (by C-G formula based on SCr of 3.06 mg/dL (H)). Liver Function Tests: Recent Labs  Lab 03/27/22 1737 03/28/22 0515  AST 78* 136*  ALT 40 77*  ALKPHOS 46 47  BILITOT 1.1 0.9  PROT 7.5 6.6  ALBUMIN 2.3* 2.0*   No results for input(s): "LIPASE", "AMYLASE" in the last 168 hours. Recent Labs  Lab 03/27/22 1804  AMMONIA 53*   Coagulation Profile: Recent Labs  Lab 03/27/22 1737  INR 1.4*   Cardiac Enzymes: Recent Labs  Lab 03/27/22 1737 03/28/22 0515  CKTOTAL 1,858* 1,844*   BNP (last 3 results) No results for input(s): "PROBNP" in the last 8760 hours. HbA1C: No  results for input(s): "HGBA1C" in the last 72 hours. CBG: Recent Labs  Lab 03/27/22 2208 03/28/22 0815  GLUCAP 155* 149*   Lipid Profile: No results for input(s): "CHOL", "HDL", "LDLCALC", "TRIG", "CHOLHDL", "LDLDIRECT" in the last 72 hours. Thyroid Function Tests: Recent Labs    03/27/22 1804  TSH 4.841*  FREET4 1.01   Anemia Panel: No results for input(s): "VITAMINB12", "FOLATE", "FERRITIN", "TIBC", "IRON", "RETICCTPCT" in the last 72 hours. Sepsis Labs: Recent Labs  Lab 03/27/22 2002 03/28/22 0157 03/28/22 0515 03/28/22 8657  PROCALCITON  --   --  8.25  --   LATICACIDVEN 3.5* 1.5 1.5 1.9    Recent Results (from the past 240 hour(s))  Resp Panel by RT-PCR (Flu A&B, Covid) Anterior Nasal Swab     Status: None   Collection Time: 03/27/22  5:13 PM   Specimen: Anterior Nasal Swab  Result Value Ref Range Status   SARS Coronavirus 2 by RT PCR NEGATIVE NEGATIVE Final    Comment: (NOTE) SARS-CoV-2 target nucleic acids are NOT DETECTED.  The SARS-CoV-2 RNA is generally detectable in upper respiratory specimens during the acute phase of infection. The lowest concentration of SARS-CoV-2 viral copies this assay can detect is 138 copies/mL. A negative result does not preclude SARS-Cov-2 infection and should not be used as the sole basis for treatment or other patient management decisions. A negative result may occur with  improper specimen collection/handling, submission of specimen other than nasopharyngeal swab, presence of viral mutation(s) within the areas targeted by this assay, and inadequate number of viral copies(<138 copies/mL). A negative result must be combined with clinical observations, patient history, and epidemiological information. The expected result is Negative.  Fact Sheet for Patients:  EntrepreneurPulse.com.au  Fact Sheet for Healthcare Providers:  IncredibleEmployment.be  This test is no t yet approved or cleared  by the [place] FDA and  has been authorized for detection and/or diagnosis of SARS-CoV-2 by FDA under an Emergency Use Authorization (EUA). This EUA will remain  in effect (meaning this test can be used) for the duration of the COVID-19 declaration under Section 564(b)(1) of the Act, 21 U.S.C.section 360bbb-3(b)(1), unless the authorization is terminated  or revoked sooner.       Influenza A by PCR NEGATIVE NEGATIVE Final   Influenza B by PCR NEGATIVE NEGATIVE Final    Comment: (NOTE) The Xpert Xpress SARS-CoV-2/FLU/RSV plus assay is intended as an aid in the diagnosis of influenza from Nasopharyngeal swab specimens and should not be used as a sole basis for treatment. Nasal washings and aspirates are unacceptable for Xpert Xpress SARS-CoV-2/FLU/RSV testing.  Fact Sheet for Patients: EntrepreneurPulse.com.au  Fact Sheet for Healthcare Providers: IncredibleEmployment.be  This test is not yet approved or cleared by the [place] FDA and has been authorized for detection and/or diagnosis of SARS-CoV-2 by FDA under an Emergency Use Authorization (EUA). This EUA will remain in effect (meaning this test can be used) for the duration of the COVID-19 declaration under Section 564(b)(1) of the Act, 21 U.S.C. section 360bbb-3(b)(1), unless the authorization is terminated or revoked.  Performed at [facility], [place] [address], [place], [place] [address]          Radiology Studies: CT ABDOMEN PELVIS WO CONTRAST  Result Date: 03/27/2022 CLINICAL DATA:  Altered mental status EXAM: CT ABDOMEN AND PELVIS WITHOUT CONTRAST TECHNIQUE: Multidetector CT imaging of the abdomen and pelvis was performed following the standard protocol without IV contrast. RADIATION DOSE REDUCTION: This exam was performed according to the departmental dose-optimization program which includes automated exposure control, adjustment of the mA and/or kV according to  patient size and/or use of iterative reconstruction technique. COMPARISON:  CT 06/30/2020 FINDINGS: Lower chest: Lung bases demonstrate no acute consolidation or effusion. Hepatobiliary: Multiple gallstones. No focal hepatic abnormality or biliary dilatation Pancreas: Unremarkable. No pancreatic ductal dilatation or surrounding inflammatory changes. Spleen: Curvilinear splenic calcifications as before. Adrenals/Urinary Tract: Adrenals within normal limits. Kidneys show no hydronephrosis. Mildly prominent left ureter without obstructing stone. Slightly thick-walled appearance of the urinary bladder. Subcentimeter hypodensities within both kidneys too small to  further characterize. Stomach/Bowel: The stomach is nonenlarged. No dilated small bowel. No acute bowel wall thickening. Negative appendix. Mild diverticular disease of the left colon. Vascular/Lymphatic: Moderate aortic atherosclerosis. No aneurysm. No suspicious lymph nodes. Reproductive: Prostate is enlarged. Other: Negative for pelvic effusion or free air. Musculoskeletal: Degenerative changes.  No acute osseous abnormality IMPRESSION: 1. Negative for hydronephrosis. Mildly prominent left ureter without obstructing stone. Slightly thick-walled urinary bladder. 2. Cholelithiasis without inflammatory change in the right upper quadrant 3. Enlarged prostate Electronically Signed   By: Donavan Foil M.D.   On: 03/27/2022 20:21   CT Head Wo Contrast  Result Date: 03/27/2022 CLINICAL DATA:  Mental status change, unknown cause EXAM: CT HEAD WITHOUT CONTRAST TECHNIQUE: Contiguous axial images were obtained from the base of the skull through the vertex without intravenous contrast. RADIATION DOSE REDUCTION: This exam was performed according to the departmental dose-optimization program which includes automated exposure control, adjustment of the mA and/or kV according to patient size and/or use of iterative reconstruction technique. COMPARISON:  08/16/2020  FINDINGS: Brain: No evidence of acute infarction, hemorrhage, hydrocephalus, extra-axial collection or mass lesion/mass effect. Patchy low-density changes within the periventricular and subcortical white matter compatible with chronic microvascular ischemic change. Moderate diffuse cerebral volume loss. Vascular: Atherosclerotic calcifications involving the large vessels of the skull base. No unexpected hyperdense vessel. Skull: Normal. Negative for fracture or focal lesion. Sinuses/Orbits: No acute finding. Other: None. IMPRESSION: 1. No acute intracranial findings. 2. Chronic microvascular ischemic change and cerebral volume loss. Electronically Signed   By: Davina Poke D.O.   On: 03/27/2022 18:35   DG Chest Port 1 View  Result Date: 03/27/2022 CLINICAL DATA:  Altered mental status. Unresponsive. Question sepsis. EXAM: PORTABLE CHEST 1 VIEW COMPARISON:  None Available. FINDINGS: The heart size is normal. Lungs are clear. Lung volumes are low. Atherosclerotic changes are present at the aortic arch. The visualized soft tissues and bony thorax are unremarkable. IMPRESSION: 1. Low lung volumes. 2. No acute cardiopulmonary disease. Electronically Signed   By: San Morelle M.D.   On: 03/27/2022 17:34        Scheduled Meds:  insulin aspart  0-9 Units Subcutaneous TID WC   tamsulosin  0.4 mg Oral Daily   Continuous Infusions:  cefTRIAXone (ROCEPHIN)  IV Stopped (03/27/22 1838)   dextrose 100 mL/hr at 03/28/22 0806   heparin 800 Units/hr (03/28/22 0806)     LOS: 1 day    Time spent: 45 minutes spent on chart review, discussion with nursing staff, consultants, updating family and interview/physical exam; more than 50% of that time was spent in counseling and/or coordination of care.    Geradine Girt, DO [facility] Available via Epic secure chat 7am-7pm After these hours, please refer to coverage provider listed on [email or web address] 03/28/2022, 8:18 AM

## 2022-03-28 NOTE — Progress Notes (Signed)
ANTICOAGULATION CONSULT NOTE - Follow Up Consult  Pharmacy Consult for IV heparin Indication: chest pain/ACS  No Known Allergies  Patient Measurements: Height: '5\' 5"'$  (165.1 cm) Weight: 65.8 kg (145 lb) IBW/kg (Calculated) : 61.5 Heparin Dosing Weight: 65 kg  Vital Signs: Temp: 98.3 F (36.8 C) (07/01 1453) BP: 158/87 (07/01 1625) Pulse Rate: 70 (07/01 1625)  Labs: Recent Labs    03/27/22 1737 03/27/22 1810 03/27/22 2002 03/28/22 0515 03/28/22 0607 03/28/22 0912 03/28/22 1603  HGB 13.0 12.9*  --  12.0*  --   --   --   HCT 42.4 38.0*  --  38.2*  --   --   --   PLT 235  --   --  173  --   --   --   LABPROT 16.7*  --   --   --   --   --   --   INR 1.4*  --   --   --   --   --   --   HEPARINUNFRC  --   --   --   --  0.34  --  0.41  CREATININE 4.43*  --   --  3.06*  --  3.03*  --   CKTOTAL 1,858*  --   --  1,844*  --   --   --   TROPONINIHS 2,634*  --  5,593* 14,045*  --   --   --     Estimated Creatinine Clearance: 14.9 mL/min (A) (by C-G formula based on SCr of 3.03 mg/dL (H)).   Medications:  Infusions:   cefTRIAXone (ROCEPHIN)  IV Stopped (03/27/22 1838)   dextrose 125 mL/hr at 03/28/22 1529   heparin 800 Units/hr (03/28/22 1400)    Assessment: 79 yom with a history of HTN, T2DM, hx of PE previously eliquis but has not been taking per son secondary to access issues, prior chronic foley. Patient is presenting with AMS. Heparin per pharmacy consult placed for chest pain/ACS.   Patient is not on anticoagulation prior to arrival. Heparin gtt started at 800 units/hr without a bolus. Resulting heparin level was 0.34 this AM. Infusion resumed without change. Repeat level at 1400 is 0.41 which is therapeutic.   No issues with infusion or bleeding per RN.  Goal of Therapy:  Heparin level 0.3-0.7 units/ml Monitor platelets by anticoagulation protocol: Yes   Plan:  Continue IV heparin at current rate. Repeat heparin level with daily labs. Daily heparin level and  CBC.  [place] Crane, Roylene Reason, BCCP Clinical Pharmacist  03/28/2022 4:43 PM   [facility] pharmacy phone numbers are listed on [email or web address]

## 2022-03-28 NOTE — Progress Notes (Signed)
ANTICOAGULATION CONSULT NOTE -  Pharmacy Consult for Heparin Indication: chest pain/ACS Brief A/P: Heparin level within goal range Continue Heparin at current rate   No Known Allergies  Patient Measurements: Height: '5\' 5"'$  (165.1 cm) Weight: 65.8 kg (145 lb) IBW/kg (Calculated) : 61.5 Heparin Dosing Weight: 65 kg  Vital Signs: Temp: 98.7 F (37.1 C) (06/30 2226) Temp Source: Oral (06/30 2226) BP: 148/78 (07/01 0417) Pulse Rate: 78 (07/01 0515)  Labs: Recent Labs    03/27/22 1737 03/27/22 1810 03/27/22 2002 03/28/22 0515 03/28/22 0607  HGB 13.0 12.9*  --  12.0*  --   HCT 42.4 38.0*  --  38.2*  --   PLT 235  --   --  173  --   LABPROT 16.7*  --   --   --   --   INR 1.4*  --   --   --   --   HEPARINUNFRC  --   --   --   --  0.34  CREATININE 4.43*  --   --  3.06*  --   CKTOTAL 1,858*  --   --  1,844*  --   TROPONINIHS 2,634*  --  5,593* 14,045*  --      Estimated Creatinine Clearance: 14.8 mL/min (A) (by C-G formula based on SCr of 3.06 mg/dL (H)).   Assessment: 88 y.o. male with NSTEMI for heparin  Goal of Therapy:  Heparin level 0.3-0.7 units/ml Monitor platelets by anticoagulation protocol: Yes   Plan:  Continue Heparin at current rate    Phillis Knack, PharmD, BCPS

## 2022-03-28 NOTE — Progress Notes (Signed)
  Echocardiogram 2D Echocardiogram has been performed.  Sergio [name] 03/28/2022, 1:20 PM

## 2022-03-28 NOTE — ED Notes (Signed)
Checked patient cbg it was 36 notified RN of blood sugar

## 2022-03-28 NOTE — ED Notes (Signed)
Critical result reported to [facility] DO

## 2022-03-29 DIAGNOSIS — E870 Hyperosmolality and hypernatremia: Secondary | ICD-10-CM | POA: Diagnosis not present

## 2022-03-29 LAB — BLOOD CULTURE ID PANEL (REFLEXED) - BCID2

## 2022-03-29 LAB — COMPREHENSIVE METABOLIC PANEL
ALT: 136 U/L — ABNORMAL HIGH (ref 0–44)
AST: 172 U/L — ABNORMAL HIGH (ref 15–41)
Albumin: 1.8 g/dL — ABNORMAL LOW (ref 3.5–5.0)
Alkaline Phosphatase: 43 U/L (ref 38–126)
Anion gap: 11 (ref 5–15)
BUN: 50 mg/dL — ABNORMAL HIGH (ref 8–23)
CO2: 24 mmol/L (ref 22–32)
Calcium: 8.2 mg/dL — ABNORMAL LOW (ref 8.9–10.3)
Chloride: 118 mmol/L — ABNORMAL HIGH (ref 98–111)
Creatinine, Ser: 2.28 mg/dL — ABNORMAL HIGH (ref 0.61–1.24)
GFR, Estimated: 27 mL/min — ABNORMAL LOW (ref >60)
Glucose, Bld: 131 mg/dL — ABNORMAL HIGH (ref 70–99)
Potassium: 3.8 mmol/L (ref 3.5–5.1)
Sodium: 153 mmol/L — ABNORMAL HIGH (ref 135–145)
Total Bilirubin: 1 mg/dL (ref 0.3–1.2)
Total Protein: 6.1 g/dL — ABNORMAL LOW (ref 6.5–8.1)

## 2022-03-29 LAB — CBC
HCT: 35.6 % — ABNORMAL LOW (ref 39.0–52.0)
Hemoglobin: 11 g/dL — ABNORMAL LOW (ref 13.0–17.0)
MCH: 27.5 pg (ref 26.0–34.0)
MCHC: 30.9 g/dL (ref 30.0–36.0)
MCV: 89 fL (ref 80.0–100.0)
Platelets: 124 10*3/uL — ABNORMAL LOW (ref 150–400)
RBC: 4 MIL/uL — ABNORMAL LOW (ref 4.22–5.81)
RDW: 15 % (ref 11.5–15.5)
WBC: 17.2 10*3/uL — ABNORMAL HIGH (ref 4.0–10.5)
nRBC: 0 % (ref 0.0–0.2)

## 2022-03-29 LAB — GLUCOSE, CAPILLARY
Glucose-Capillary: 109 mg/dL — ABNORMAL HIGH (ref 70–99)
Glucose-Capillary: 149 mg/dL — ABNORMAL HIGH (ref 70–99)
Glucose-Capillary: 215 mg/dL — ABNORMAL HIGH (ref 70–99)
Glucose-Capillary: 57 mg/dL — ABNORMAL LOW (ref 70–99)
Glucose-Capillary: 68 mg/dL — ABNORMAL LOW (ref 70–99)
Glucose-Capillary: 92 mg/dL (ref 70–99)

## 2022-03-29 LAB — HEPARIN LEVEL (UNFRACTIONATED)
Heparin Unfractionated: 0.28 IU/mL — ABNORMAL LOW (ref 0.30–0.70)
Heparin Unfractionated: 0.26 IU/mL — ABNORMAL LOW (ref 0.30–0.70)

## 2022-03-29 LAB — CK: Total CK: 756 U/L — ABNORMAL HIGH (ref 49–397)

## 2022-03-29 MED ORDER — DEXTROSE 5 % IV SOLN
INTRAVENOUS | Status: AC
Start: 2022-03-29 — End: 2022-03-30

## 2022-03-29 NOTE — Progress Notes (Signed)
PROGRESS NOTE    AVERIE [name]  HKV:[id number] DOB: 1932/10/15 DOA: 03/27/2022 PCP: Forrest Moron, MD    Brief Narrative:  Sergio [name] is a 88 y.o. male with medical history significant for type 2 diabetes mellitus, essential hypertension, BPH, stage IIIb chronic kidney disease associated baseline creatinine range 5.6-4.3, chronic diastolic heart failure, who is admitted to [facility] on 03/27/2022 with acute hypernatremia after presenting from home to [facility] ED for evaluation of altered mental status.  Slowly improving.   Assessment and Plan: * Hypernatremia -Acute Hypovolemic hypernatremia: -Suggestive of free water deficiency in the context of dehydration stemming from family's report of the patient exhibiting evidence of significant decline in oral intake of both food and water over the course of the last week.   -total body water deficit calculated to be 5.6 L.   -continue IVF -BMP daily -mental status much improved on AM of 7/2  chronic diastolic CHF (congestive heart failure) ([place]) -most recent echocardiogram performed in May 2018 notable for LVEF 55 to 60% as well as grade 1 diastolic dysfunction, -No clinical or radiographic evidence to suggest acutely decompensated heart failure at this time. - monitor strict I's & O's and daily weights. -repeat echo similar to prior   Hypercalcemia -Suspect element of dehydration -IVF -trend    NSTEMI (non-ST elevated myocardial infarction) ([place]) -Type 2 NSTEMI: - Presenting EKG shows no evidence of acute T wave or ST changes relative to most recent prior EKG from April 2023, including no evidence of ST elevation, as further detailed above. suspect that this elevated troponin is on the basis of supply demand mismatch in the setting of severe sepsis due to urinary tract infection, significant dehydration, as well as significant decline in renal clearance as a consequence of AKI on CKD 3B, as opposed to representing a type I  process due to acute plaque rupture.  -heparin gtt x 3 days -echo done -will hold on cards consult as not sure he would be a candidate for intervention based on Cr/other co-morbidities    Acute on chronic urinary retention - History of urinary retention: Documented history of such, including documented history of BPH for which the patient was previously on tamsulosin as well as per chart review revealing a history of urethral balloon dilation in July 2022.   -foley placed in ER   Acute metabolic encephalopathy - Progressive confusion/somnolence over the course of the last week, which appears multifactorial in nature, with metabolic contributions from severe sepsis due to UTI as well as acute hyponatremia, dehydration, acute kidney injury and mild hypercalcemia.    Severe sepsis ([place]) - Severe sepsis due to urinary tract infection: - Blood cultures x2 : 1/2 gram + cocci -urine culture collected prior to initiation of IV Rocephin -CT abdomen/pelvis, aside from showing evidence of mild bladder wall thickening, consistent with suspected acute cystitis, no additional evidence of acute intra-abdominal/intrapelvic process.   Acute renal failure superimposed on stage 3b chronic kidney disease (HCC) -IV fluids, as above.   -Monitor strict I's and O's and daily weights.   -avoid nephrotoxic agents.     DM2 (diabetes mellitus, type 2) (HCC) - accuchecks QAC and HS with low dose SSI.   - hemoglobin A1c level.  Elevated LFTs -? Shock liver    DVT prophylaxis: SCDs Start: 03/27/22 2044    Code Status: Full Code   Disposition Plan:  Level of care: Progressive Status is: Inpatient Remains inpatient appropriate because: needs correction of Na  LM  for son  Consultants:  none   Subjective: Much more awake and interactive Says he is hungry  Objective: Vitals:   03/28/22 2319 03/29/22 0324 03/29/22 0500 03/29/22 0754  BP: 136/76 (!) 150/67  134/62  Pulse: 75 85  83  Resp: 18  20  (!) 23  Temp: 97.6 F (36.4 C) 97.8 F (36.6 C)  98.1 F (36.7 C)  TempSrc: Oral Oral  Oral  SpO2: 100% 100%  100%  Weight:   57.7 kg   Height:        Intake/Output Summary (Last 24 hours) at 03/29/2022 0827 Last data filed at 03/29/2022 0325 Gross per 24 hour  Intake 2343.62 ml  Output 703 ml  Net 1640.62 ml   Filed Weights   03/27/22 2151 03/29/22 0500  Weight: 65.8 kg 57.7 kg    Examination:   General: Appearance:    Elderly male in no acute distress, dry mucous membranes     Lungs:     respirations unlabored  Heart:    Normal heart rate.   MS:   All extremities are intact.   Neurologic:   Awake, alert, pleasant and cooperative         Data Reviewed: I have personally reviewed following labs and imaging studies  CBC: Recent Labs  Lab 03/27/22 1737 03/27/22 1810 03/28/22 0515 03/29/22 0212  WBC 20.1*  --  20.4* 17.2*  NEUTROABS 18.3*  --  17.5*  --   HGB 13.0 12.9* 12.0* 11.0*  HCT 42.4 38.0* 38.2* 35.6*  MCV 89.6  --  89.0 89.0  PLT 235  --  173 810*   Basic Metabolic Panel: Recent Labs  Lab 03/27/22 1737 03/27/22 1810 03/27/22 2002 03/28/22 0515 03/28/22 0912 03/28/22 1603 03/29/22 0212  NA 163* 165*  --  158* 159* 155* 153*  K 4.3 4.1  --  3.7 3.9 3.5 3.8  CL 128*  --   --  125* 125* 122* 118*  CO2 21*  --   --  '25 25 23 24  '$ GLUCOSE 259*  --   --  184* 177* 204* 131*  BUN 78*  --   --  66* 64* 58* 50*  CREATININE 4.43*  --   --  3.06* 3.03* 2.60* 2.28*  CALCIUM 9.5  --   --  8.7* 8.6* 8.5* 8.2*  MG  --   --  2.6* 2.6*  --   --   --   PHOS  --   --   --  3.3  --   --   --    GFR: Estimated Creatinine Clearance: 18.6 mL/min (A) (by C-G formula based on SCr of 2.28 mg/dL (H)). Liver Function Tests: Recent Labs  Lab 03/27/22 1737 03/28/22 0515 03/29/22 0212  AST 78* 136* 172*  ALT 40 77* 136*  ALKPHOS 46 47 43  BILITOT 1.1 0.9 1.0  PROT 7.5 6.6 6.1*  ALBUMIN 2.3* 2.0* 1.8*   No results for input(s): "LIPASE", "AMYLASE" in the  last 168 hours. Recent Labs  Lab 03/27/22 1804  AMMONIA 53*   Coagulation Profile: Recent Labs  Lab 03/27/22 1737  INR 1.4*   Cardiac Enzymes: Recent Labs  Lab 03/27/22 1737 03/28/22 0515 03/29/22 0212  CKTOTAL 1,858* 1,844* 756*   BNP (last 3 results) No results for input(s): "PROBNP" in the last 8760 hours. HbA1C: No results for input(s): "HGBA1C" in the last 72 hours. CBG: Recent Labs  Lab 03/28/22 0815 03/28/22 1258 03/28/22 1628 03/28/22 2110 03/29/22 1751  GLUCAP 149* 104* 203* 125* 68*   Lipid Profile: No results for input(s): "CHOL", "HDL", "LDLCALC", "TRIG", "CHOLHDL", "LDLDIRECT" in the last 72 hours. Thyroid Function Tests: Recent Labs    03/27/22 1804  TSH 4.841*  FREET4 1.01   Anemia Panel: No results for input(s): "VITAMINB12", "FOLATE", "FERRITIN", "TIBC", "IRON", "RETICCTPCT" in the last 72 hours. Sepsis Labs: Recent Labs  Lab 03/27/22 2002 03/28/22 0157 03/28/22 0515 03/28/22 5784  PROCALCITON  --   --  8.25  --   LATICACIDVEN 3.5* 1.5 1.5 1.9    Recent Results (from the past 240 hour(s))  Resp Panel by RT-PCR (Flu A&B, Covid) Anterior Nasal Swab     Status: None   Collection Time: 03/27/22  5:13 PM   Specimen: Anterior Nasal Swab  Result Value Ref Range Status   SARS Coronavirus 2 by RT PCR NEGATIVE NEGATIVE Final    Comment: (NOTE) SARS-CoV-2 target nucleic acids are NOT DETECTED.  The SARS-CoV-2 RNA is generally detectable in upper respiratory specimens during the acute phase of infection. The lowest concentration of SARS-CoV-2 viral copies this assay can detect is 138 copies/mL. A negative result does not preclude SARS-Cov-2 infection and should not be used as the sole basis for treatment or other patient management decisions. A negative result may occur with  improper specimen collection/handling, submission of specimen other than nasopharyngeal swab, presence of viral mutation(s) within the areas targeted by this assay,  and inadequate number of viral copies(<138 copies/mL). A negative result must be combined with clinical observations, patient history, and epidemiological information. The expected result is Negative.  Fact Sheet for Patients:  EntrepreneurPulse.com.au  Fact Sheet for Healthcare Providers:  IncredibleEmployment.be  This test is no t yet approved or cleared by the [place] FDA and  has been authorized for detection and/or diagnosis of SARS-CoV-2 by FDA under an Emergency Use Authorization (EUA). This EUA will remain  in effect (meaning this test can be used) for the duration of the COVID-19 declaration under Section 564(b)(1) of the Act, 21 U.S.C.section 360bbb-3(b)(1), unless the authorization is terminated  or revoked sooner.       Influenza A by PCR NEGATIVE NEGATIVE Final   Influenza B by PCR NEGATIVE NEGATIVE Final    Comment: (NOTE) The Xpert Xpress SARS-CoV-2/FLU/RSV plus assay is intended as an aid in the diagnosis of influenza from Nasopharyngeal swab specimens and should not be used as a sole basis for treatment. Nasal washings and aspirates are unacceptable for Xpert Xpress SARS-CoV-2/FLU/RSV testing.  Fact Sheet for Patients: EntrepreneurPulse.com.au  Fact Sheet for Healthcare Providers: IncredibleEmployment.be  This test is not yet approved or cleared by the [place] FDA and has been authorized for detection and/or diagnosis of SARS-CoV-2 by FDA under an Emergency Use Authorization (EUA). This EUA will remain in effect (meaning this test can be used) for the duration of the COVID-19 declaration under Section 564(b)(1) of the Act, 21 U.S.C. section 360bbb-3(b)(1), unless the authorization is terminated or revoked.  Performed at [facility], [place] [address], [place], [place] [address]   Blood Culture (routine x 2)     Status: None (Preliminary result)   Collection Time:  03/27/22  5:37 PM   Specimen: BLOOD  Result Value Ref Range Status   Specimen Description BLOOD RIGHT ANTECUBITAL  Final   Special Requests Blood Culture adequate volume  Final   Culture   Final    NO GROWTH 2 DAYS Performed at [facility], [place],  [place] [address]    Report Status PENDING  Incomplete  Blood Culture (routine x 2)     Status: None (Preliminary result)   Collection Time: 03/27/22  6:03 PM   Specimen: BLOOD  Result Value Ref Range Status   Specimen Description BLOOD SITE NOT SPECIFIED  Final   Special Requests   Final    BOTTLES DRAWN AEROBIC AND ANAEROBIC Blood Culture adequate volume   Culture  Setup Time   Final    GRAM POSITIVE COCCI IN CLUSTERS ANAEROBIC BOTTLE ONLY Organism ID to follow Performed at [facility], [place] [address], [place], [place] [address]    Culture [facility] POSITIVE COCCI  Final   Report Status PENDING  Incomplete         Radiology Studies: ECHOCARDIOGRAM COMPLETE  Result Date: 03/28/2022    ECHOCARDIOGRAM REPORT   Patient Name:   Antionette Poles Date of Exam: 03/28/2022 Medical Rec #:  [id number]       Height:       65.0 in Accession #:    [id number]      Weight:       145.0 lb Date of Birth:  [birth year]       BSA:          1.725 m Patient Age:    47 years        BP:           161/73 mmHg Patient Gender: M               HR:           65 bpm. Exam Location:  Inpatient Procedure: 2D Echo Indications:    NSTEMI  History:        Patient has no prior history of Echocardiogram examinations,                 most recent 02/04/2017. CHF, chronic kidney disease,                 Signs/Symptoms:Altered Mental Status; Risk Factors:Hypertension,                 Diabetes and Dyslipidemia.  Sonographer:    Johny Chess RDCS Referring Phys: [id number] Rhetta Mura  Sonographer Comments: Image acquisition challenging due to uncooperative patient. IMPRESSIONS  1. Left ventricular ejection fraction, by estimation, is 65 to 70%. The  left ventricle has normal function. The left ventricle has no regional wall motion abnormalities. Left ventricular diastolic parameters are indeterminate.  2. Right ventricular systolic function is normal. The right ventricular size is normal. Tricuspid regurgitation signal is inadequate for assessing PA pressure.  3. The mitral valve is grossly normal. Trivial mitral valve regurgitation.  4. The aortic valve is tricuspid. There is mild calcification of the aortic valve. Aortic valve regurgitation is not visualized.  5. The inferior vena cava is normal in size with greater than 50% respiratory variability, suggesting right atrial pressure of 3 mmHg. Comparison(s): Prior images unable to be directly viewed. FINDINGS  Left Ventricle: Left ventricular ejection fraction, by estimation, is 65 to 70%. The left ventricle has normal function. The left ventricle has no regional wall motion abnormalities. The left ventricular internal cavity size was normal in size. There is  borderline left ventricular hypertrophy. Left ventricular diastolic parameters are indeterminate. Right Ventricle: The right ventricular size is normal. No increase in right ventricular wall thickness. Right ventricular systolic function is normal. Tricuspid regurgitation signal is inadequate for assessing PA pressure. Left Atrium:  Left atrial size was normal in size. Right Atrium: Right atrial size was normal in size. Pericardium: The pericardium was not well visualized. Mitral Valve: The mitral valve is grossly normal. Mild mitral annular calcification. Trivial mitral valve regurgitation. Tricuspid Valve: The tricuspid valve is grossly normal. Tricuspid valve regurgitation is trivial. Aortic Valve: The aortic valve is tricuspid. There is mild calcification of the aortic valve. Aortic valve regurgitation is not visualized. Pulmonic Valve: The pulmonic valve was not well visualized. Pulmonic valve regurgitation is trivial. Aorta: The aortic root is  normal in size and structure. Venous: The inferior vena cava is normal in size with greater than 50% respiratory variability, suggesting right atrial pressure of 3 mmHg. IAS/Shunts: No atrial level shunt detected by color flow Doppler.  LEFT VENTRICLE PLAX 2D LVIDd:         4.20 cm   Diastology LVIDs:         2.40 cm   LV e' medial:    5.55 cm/s LV PW:         0.90 cm   LV E/e' medial:  9.5 LV IVS:        1.00 cm   LV e' lateral:   7.40 cm/s LVOT diam:     1.80 cm   LV E/e' lateral: 7.1 LV SV:         49 LV SV Index:   29 LVOT Area:     2.54 cm  RIGHT VENTRICLE             IVC RV S prime:     12.90 cm/s  IVC diam: 1.30 cm TAPSE (M-mode): 1.3 cm LEFT ATRIUM             Index        RIGHT ATRIUM          Index LA diam:        3.30 cm 1.91 cm/m   RA Area:     9.48 cm LA Vol (A2C):   39.7 ml 23.01 ml/m  RA Volume:   20.00 ml 11.59 ml/m LA Vol (A4C):   30.6 ml 17.73 ml/m LA Biplane Vol: 36.4 ml 21.10 ml/m  AORTIC VALVE LVOT Vmax:   91.30 cm/s LVOT Vmean:  61.600 cm/s LVOT VTI:    0.194 m  AORTA Ao Root diam: 3.10 cm MITRAL VALVE MV Area (PHT): 2.48 cm    SHUNTS MV Decel Time: 306 msec    Systemic VTI:  0.19 m MV E velocity: 52.80 cm/s  Systemic Diam: 1.80 cm MV A velocity: 86.60 cm/s MV E/A ratio:  0.61 Rozann Lesches MD Electronically signed by Rozann Lesches MD Signature Date/Time: 03/28/2022/1:28:41 PM    Final    CT ABDOMEN PELVIS WO CONTRAST  Result Date: 03/27/2022 CLINICAL DATA:  Altered mental status EXAM: CT ABDOMEN AND PELVIS WITHOUT CONTRAST TECHNIQUE: Multidetector CT imaging of the abdomen and pelvis was performed following the standard protocol without IV contrast. RADIATION DOSE REDUCTION: This exam was performed according to the departmental dose-optimization program which includes automated exposure control, adjustment of the mA and/or kV according to patient size and/or use of iterative reconstruction technique. COMPARISON:  CT 06/30/2020 FINDINGS: Lower chest: Lung bases demonstrate no  acute consolidation or effusion. Hepatobiliary: Multiple gallstones. No focal hepatic abnormality or biliary dilatation Pancreas: Unremarkable. No pancreatic ductal dilatation or surrounding inflammatory changes. Spleen: Curvilinear splenic calcifications as before. Adrenals/Urinary Tract: Adrenals within normal limits. Kidneys show no hydronephrosis. Mildly prominent left ureter without obstructing stone. Slightly thick-walled  appearance of the urinary bladder. Subcentimeter hypodensities within both kidneys too small to further characterize. Stomach/Bowel: The stomach is nonenlarged. No dilated small bowel. No acute bowel wall thickening. Negative appendix. Mild diverticular disease of the left colon. Vascular/Lymphatic: Moderate aortic atherosclerosis. No aneurysm. No suspicious lymph nodes. Reproductive: Prostate is enlarged. Other: Negative for pelvic effusion or free air. Musculoskeletal: Degenerative changes.  No acute osseous abnormality IMPRESSION: 1. Negative for hydronephrosis. Mildly prominent left ureter without obstructing stone. Slightly thick-walled urinary bladder. 2. Cholelithiasis without inflammatory change in the right upper quadrant 3. Enlarged prostate Electronically Signed   By: Donavan Foil M.D.   On: 03/27/2022 20:21   CT Head Wo Contrast  Result Date: 03/27/2022 CLINICAL DATA:  Mental status change, unknown cause EXAM: CT HEAD WITHOUT CONTRAST TECHNIQUE: Contiguous axial images were obtained from the base of the skull through the vertex without intravenous contrast. RADIATION DOSE REDUCTION: This exam was performed according to the departmental dose-optimization program which includes automated exposure control, adjustment of the mA and/or kV according to patient size and/or use of iterative reconstruction technique. COMPARISON:  08/16/2020 FINDINGS: Brain: No evidence of acute infarction, hemorrhage, hydrocephalus, extra-axial collection or mass lesion/mass effect. Patchy low-density  changes within the periventricular and subcortical white matter compatible with chronic microvascular ischemic change. Moderate diffuse cerebral volume loss. Vascular: Atherosclerotic calcifications involving the large vessels of the skull base. No unexpected hyperdense vessel. Skull: Normal. Negative for fracture or focal lesion. Sinuses/Orbits: No acute finding. Other: None. IMPRESSION: 1. No acute intracranial findings. 2. Chronic microvascular ischemic change and cerebral volume loss. Electronically Signed   By: Davina Poke D.O.   On: 03/27/2022 18:35   DG Chest Port 1 View  Result Date: 03/27/2022 CLINICAL DATA:  Altered mental status. Unresponsive. Question sepsis. EXAM: PORTABLE CHEST 1 VIEW COMPARISON:  None Available. FINDINGS: The heart size is normal. Lungs are clear. Lung volumes are low. Atherosclerotic changes are present at the aortic arch. The visualized soft tissues and bony thorax are unremarkable. IMPRESSION: 1. Low lung volumes. 2. No acute cardiopulmonary disease. Electronically Signed   By: San Morelle M.D.   On: 03/27/2022 17:34        Scheduled Meds:  insulin aspart  0-9 Units Subcutaneous TID WC   tamsulosin  0.4 mg Oral Daily   Continuous Infusions:  cefTRIAXone (ROCEPHIN)  IV Stopped (03/28/22 1742)   heparin 800 Units/hr (03/29/22 0337)     LOS: 2 days    Time spent: 45 minutes spent on chart review, discussion with nursing staff, consultants, updating family and interview/physical exam; more than 50% of that time was spent in counseling and/or coordination of care.    Geradine Girt, DO [facility] Available via Epic secure chat 7am-7pm After these hours, please refer to coverage provider listed on [email or web address] 03/29/2022, 8:27 AM

## 2022-03-29 NOTE — Progress Notes (Signed)
ANTICOAGULATION CONSULT NOTE - Follow Up Consult  Pharmacy Consult for IV heparin Indication: chest pain/ACS  No Known Allergies  Patient Measurements: Height: '5\' 5"'$  (165.1 cm) Weight: 57.7 kg (127 lb 3.3 oz) IBW/kg (Calculated) : 61.5 Heparin Dosing Weight: 65 kg  Vital Signs: Temp: 98.1 F (36.7 C) (07/02 0754) Temp Source: Oral (07/02 0754) BP: 134/62 (07/02 0754) Pulse Rate: 83 (07/02 0754)  Labs: Recent Labs    03/27/22 1737 03/27/22 1810 03/27/22 2002 03/28/22 0515 03/28/22 9758 03/28/22 0912 03/28/22 1603 03/29/22 0212  HGB 13.0 12.9*  --  12.0*  --   --   --  11.0*  HCT 42.4 38.0*  --  38.2*  --   --   --  35.6*  PLT 235  --   --  173  --   --   --  124*  LABPROT 16.7*  --   --   --   --   --   --   --   INR 1.4*  --   --   --   --   --   --   --   HEPARINUNFRC  --   --   --   --  0.34  --  0.41 0.28*  CREATININE 4.43*  --   --  3.06*  --  3.03* 2.60* 2.28*  CKTOTAL 1,858*  --   --  1,844*  --   --   --  756*  TROPONINIHS 2,634*  --  5,593* 14,045*  --   --  5,816*  --      Estimated Creatinine Clearance: 18.6 mL/min (A) (by C-G formula based on SCr of 2.28 mg/dL (H)).   Medications:  Infusions:   cefTRIAXone (ROCEPHIN)  IV Stopped (03/28/22 1742)   heparin 800 Units/hr (03/29/22 8325)    Assessment: 26 yom with a history of HTN, T2DM, hx of PE previously eliquis but has not been taking per son secondary to access issues, prior chronic foley. Patient is presenting with AMS. Patient is not on anticoagulation prior to arrival. Heparin per pharmacy consult placed for chest pain/ACS. Trops up to 14k and trending down. Suspecting multifactorial Type 2 NSTEMI.  Heparin level is subtherapeutic this morning at 0.28. Hgb trending down, may be mostly d/t rehydration. Will monitor.   No issues with infusion or bleeding per RN.  Goal of Therapy:  Heparin level 0.3-0.7 units/ml Monitor platelets by anticoagulation protocol: Yes   Plan:  Increase IV heparin  to 900 units/hr. Check anti-Xa level in 8 hours and daily while on heparin Continue to monitor H&H and platelets   Thank you for allowing [place] to participate in this patients care. Jens Som, PharmD 03/29/2022 8:04 AM  **Pharmacist phone directory can be found on [email or web address] listed under [place]**

## 2022-03-29 NOTE — Consult Note (Signed)
WOC Nurse Consult Note: Reason for Consult: Consult received from Dr. Princella Ion or chronic, nonhealing left great toe ulceration, full thickness. Patient was at one time seen by Podiatry (Dr. Maryelizabeth Rowan of [facility]) but has not been seen in that office in 2 years.  Wound type:neuropathic  Pressure Injury POA: N/A Measurement:per nursing flow sheet yesterday, by Abundio Miu. Wound bed: red, dry Drainage (amount, consistency, odor) none Periwound:intact Dressing procedure/placement/frequency: I have provided Nursing with guidance for the topical care of this wound using a soap and water daily cleanse followed by painting of the lesion with a povidone iodine swabstick. When dry, the lesions is to be dressed with dry gauze and secured with conform bandaging.  A sacral foam and floatation f the heels is ordered for PI prevention.  [place] nursing team will not follow, but will remain available to this patient, the nursing and medical teams.  Please re-consult if needed.  Thank you for inviting [place] to participate in this patient's Plan of Care.  Maudie Flakes, MSN, RN, CNS, [place], Serita Grammes, [employer], [employer] phone:  [phone]

## 2022-03-29 NOTE — Progress Notes (Addendum)
ANTICOAGULATION CONSULT NOTE - Follow Up Consult  Pharmacy Consult for IV heparin Indication: chest pain/ACS  No Known Allergies  Patient Measurements: Height: '5\' 5"'$  (165.1 cm) Weight: 57.7 kg (127 lb 3.3 oz) IBW/kg (Calculated) : 61.5 Heparin Dosing Weight: 65 kg  Vital Signs: Temp: 98.3 F (36.8 C) (07/02 1547) Temp Source: Oral (07/02 1547) BP: 132/79 (07/02 1547) Pulse Rate: 93 (07/02 1547)  Labs: Recent Labs    03/27/22 1737 03/27/22 1810 03/27/22 2002 03/28/22 0515 03/28/22 0607 03/28/22 0912 03/28/22 1603 03/29/22 0212 03/29/22 1704  HGB 13.0 12.9*  --  12.0*  --   --   --  11.0*  --   HCT 42.4 38.0*  --  38.2*  --   --   --  35.6*  --   PLT 235  --   --  173  --   --   --  124*  --   LABPROT 16.7*  --   --   --   --   --   --   --   --   INR 1.4*  --   --   --   --   --   --   --   --   HEPARINUNFRC  --   --   --   --    < >  --  0.41 0.28* 0.26*  CREATININE 4.43*  --   --  3.06*  --  3.03* 2.60* 2.28*  --   CKTOTAL 1,858*  --   --  1,844*  --   --   --  756*  --   TROPONINIHS 2,634*  --  5,593* 14,045*  --   --  5,816*  --   --    < > = values in this interval not displayed.     Estimated Creatinine Clearance: 18.6 mL/min (A) (by C-G formula based on SCr of 2.28 mg/dL (H)).   Medications:  Infusions:   cefTRIAXone (ROCEPHIN)  IV 1 g (03/29/22 1730)   dextrose 75 mL/hr at 03/29/22 0846   heparin 900 Units/hr (03/29/22 3846)    Assessment: 88 yom with a history of HTN, T2DM, hx of PE previously eliquis but has not been taking per son secondary to access issues, prior chronic foley. Patient is presenting with AMS. Patient is not on anticoagulation prior to arrival. Heparin per pharmacy consult placed for chest pain/ACS. Suspecting multifactorial Type 2 NSTEMI.  Heparin level is subtherapeutic this evening at 0.26. Hgb noted to be trending down this morning, may be mostly d/t rehydration. Continue monitor.   No issues with infusion or bleeding  noted.  Goal of Therapy:  Heparin level 0.3-0.7 units/ml Monitor platelets by anticoagulation protocol: Yes   Plan:  Increase IV heparin to 1050 units/hr. Check anti-Xa level in 8 hours and daily while on heparin Continue to monitor H&H and platelets   Thank you for allowing [place] to participate in this patients care. Lorelei Pont, PharmD, BCPS 03/29/2022 7:20 PM ED Clinical Pharmacist -  [phone]

## 2022-03-29 NOTE — Progress Notes (Signed)
PHARMACY - PHYSICIAN COMMUNICATION CRITICAL VALUE ALERT - BLOOD CULTURE IDENTIFICATION (BCID)  Sergio [name] is an 88 y.o. male who presented to [facility] on 03/27/2022.   Assessment:  1/4 GPC in clusters, BCID negative   Name of physician (or Provider) Contacted: Dr. Eliseo Squires  Current antibiotics: ceftriaxone 1g q24h   Changes to prescribed antibiotics recommended: None - likely contaminant  Recommendations accepted by provider  No results found for this or any previous visit.  Cristela Felt, PharmD, BCPS Clinical Pharmacist 03/29/2022 9:21 AM

## 2022-03-30 DIAGNOSIS — E870 Hyperosmolality and hypernatremia: Secondary | ICD-10-CM | POA: Diagnosis not present

## 2022-03-30 LAB — URINE CULTURE: Culture: 60000 — AB

## 2022-03-30 LAB — COMPREHENSIVE METABOLIC PANEL
ALT: 474 U/L — ABNORMAL HIGH (ref 0–44)
AST: 449 U/L — ABNORMAL HIGH (ref 15–41)
Albumin: 1.7 g/dL — ABNORMAL LOW (ref 3.5–5.0)
Alkaline Phosphatase: 56 U/L (ref 38–126)
Anion gap: 10 (ref 5–15)
BUN: 41 mg/dL — ABNORMAL HIGH (ref 8–23)
CO2: 24 mmol/L (ref 22–32)
Calcium: 8.2 mg/dL — ABNORMAL LOW (ref 8.9–10.3)
Chloride: 115 mmol/L — ABNORMAL HIGH (ref 98–111)
Creatinine, Ser: 2.1 mg/dL — ABNORMAL HIGH (ref 0.61–1.24)
GFR, Estimated: 30 mL/min — ABNORMAL LOW (ref >60)
Glucose, Bld: 179 mg/dL — ABNORMAL HIGH (ref 70–99)
Potassium: 4.2 mmol/L (ref 3.5–5.1)
Sodium: 149 mmol/L — ABNORMAL HIGH (ref 135–145)
Total Bilirubin: 0.5 mg/dL (ref 0.3–1.2)
Total Protein: 6 g/dL — ABNORMAL LOW (ref 6.5–8.1)

## 2022-03-30 LAB — HEMOGLOBIN A1C
Hgb A1c MFr Bld: 6.3 % — ABNORMAL HIGH (ref 4.8–5.6)
Mean Plasma Glucose: 134 mg/dL

## 2022-03-30 LAB — HEPARIN LEVEL (UNFRACTIONATED): Heparin Unfractionated: 0.4 IU/mL (ref 0.30–0.70)

## 2022-03-30 LAB — CBC
HCT: 32.1 % — ABNORMAL LOW (ref 39.0–52.0)
Hemoglobin: 10.2 g/dL — ABNORMAL LOW (ref 13.0–17.0)
MCH: 28.3 pg (ref 26.0–34.0)
MCHC: 31.8 g/dL (ref 30.0–36.0)
MCV: 88.9 fL (ref 80.0–100.0)
Platelets: 109 10*3/uL — ABNORMAL LOW (ref 150–400)
RBC: 3.61 MIL/uL — ABNORMAL LOW (ref 4.22–5.81)
RDW: 14.9 % (ref 11.5–15.5)
WBC: 15 10*3/uL — ABNORMAL HIGH (ref 4.0–10.5)
nRBC: 0.1 % (ref 0.0–0.2)

## 2022-03-30 LAB — GLUCOSE, CAPILLARY
Glucose-Capillary: 136 mg/dL — ABNORMAL HIGH (ref 70–99)
Glucose-Capillary: 230 mg/dL — ABNORMAL HIGH (ref 70–99)
Glucose-Capillary: 163 mg/dL — ABNORMAL HIGH (ref 70–99)
Glucose-Capillary: 180 mg/dL — ABNORMAL HIGH (ref 70–99)

## 2022-03-30 MED ORDER — DEXTROSE 5 % IV SOLN
INTRAVENOUS | Status: DC
Start: 2022-03-30 — End: 2022-03-31

## 2022-03-30 NOTE — Progress Notes (Signed)
PROGRESS NOTE    Sergio [name]  KYH:[id number] DOB: [birth year] DOA: 03/27/2022 PCP: Forrest Moron, MD    Brief Narrative:  Sergio [name] is a 88 y.o. male with medical history significant for type 2 diabetes mellitus, essential hypertension, BPH, stage IIIb chronic kidney disease associated baseline creatinine range 1.5-1.7, chronic diastolic heart failure, who is admitted to [facility] on 03/27/2022 with acute hypernatremia after presenting from home to [facility] ED for evaluation of altered mental status.  Slowly improving.   Assessment and Plan: * Hypernatremia -Acute Hypovolemic hypernatremia: -Suggestive of free water deficiency in the context of dehydration stemming from family's report of the patient exhibiting evidence of significant decline in oral intake of both food and water over the course of the last week.   -continue IVF -BMP daily -mental status much improved on AM of 7/2 -patient eating 7/3  chronic diastolic CHF (congestive heart failure) ([place]) -most recent echocardiogram performed in May 2018 notable for LVEF 55 to 60% as well as grade 1 diastolic dysfunction, -No clinical or radiographic evidence to suggest acutely decompensated heart failure at this time. - monitor strict I's & O's and daily weights. -repeat echo similar to prior   Hypercalcemia -Suspect element of dehydration -IVF -trend    NSTEMI (non-ST elevated myocardial infarction) ([place]) -Type 2 NSTEMI: - Presenting EKG shows no evidence of acute T wave or ST changes relative to most recent prior EKG from April 2023, including no evidence of ST elevation, as further detailed above. suspect that this elevated troponin is on the basis of supply demand mismatch in the setting of severe sepsis due to urinary tract infection, significant dehydration, as well as significant decline in renal clearance as a consequence of AKI on CKD 3B, as opposed to representing a type I process due to acute plaque  rupture.  -heparin gtt x 3 days -echo done -will hold on cards consult as not sure he would be a candidate for intervention based on Cr/other co-morbidities    Acute on chronic urinary retention - History of urinary retention: Documented history of such, including documented history of BPH for which the patient was previously on tamsulosin as well as per chart review revealing a history of urethral balloon dilation in July 2022.   -foley placed in ER   Acute metabolic encephalopathy - Progressive confusion/somnolence over the course of the last week, which appears multifactorial in nature, with metabolic contributions from severe sepsis due to UTI as well as acute hyponatremia, dehydration, acute kidney injury and mild hypercalcemia.    Severe sepsis ([place]) - Severe sepsis due to urinary tract infection: - Blood cultures x2 : 1/2 gram + cocci- suspected contaminant  -urine culture collected prior to initiation of IV Rocephin- proteus -CT abdomen/pelvis, aside from showing evidence of mild bladder wall thickening, consistent with suspected acute cystitis, no additional evidence of acute intra-abdominal/intrapelvic process.   Acute renal failure superimposed on stage 3b chronic kidney disease (HCC) -IV fluids, as above.   -Monitor strict I's and O's and daily weights.   -avoid nephrotoxic agents.     DM2 (diabetes mellitus, type 2) (HCC) - accuchecks QAC and HS with low dose SSI.   - hemoglobin A1c level.  Elevated LFTs -? Shock liver Viral hepatitis panel -trend -no RUQ pain    DVT prophylaxis: SCDs Start: 03/27/22 2044    Code Status: Full Code   Disposition Plan:  Level of care: Progressive Status is: Inpatient Remains inpatient appropriate because: needs correction of  Na  LM for son x2  Consultants:  none   Subjective: Eating breakfast  Objective: Vitals:   03/29/22 2300 03/30/22 0324 03/30/22 0427 03/30/22 0800  BP: (!) 143/67 (!) 145/66  (!) 150/68   Pulse: 85 82  80  Resp: '17 20  19  '$ Temp: 97.8 F (36.6 C) 97.8 F (36.6 C)  97.9 F (36.6 C)  TempSrc: Oral Oral  Oral  SpO2: 100% 100%  100%  Weight:   58.2 kg   Height:        Intake/Output Summary (Last 24 hours) at 03/30/2022 1129 Last data filed at 03/30/2022 1000 Gross per 24 hour  Intake 878.03 ml  Output 1300 ml  Net -421.97 ml   Filed Weights   03/27/22 2151 03/29/22 0500 03/30/22 0427  Weight: 65.8 kg 57.7 kg 58.2 kg    Examination:    General: Appearance:    elderly male in no acute distress     Lungs:    respirations unlabored  Heart:    Normal heart rate. Normal rhythm. No murmurs, rubs, or gallops.   MS:   All extremities are intact.   Neurologic:   Awake, alert       Data Reviewed: I have personally reviewed following labs and imaging studies  CBC: Recent Labs  Lab 03/27/22 1737 03/27/22 1810 03/28/22 0515 03/29/22 0212 03/30/22 0022  WBC 20.1*  --  20.4* 17.2* 15.0*  NEUTROABS 18.3*  --  17.5*  --   --   HGB 13.0 12.9* 12.0* 11.0* 10.2*  HCT 42.4 38.0* 38.2* 35.6* 32.1*  MCV 89.6  --  89.0 89.0 88.9  PLT 235  --  173 124* 053*   Basic Metabolic Panel: Recent Labs  Lab 03/27/22 2002 03/28/22 0515 03/28/22 0912 03/28/22 1603 03/29/22 0212 03/30/22 0022  NA  --  158* 159* 155* 153* 149*  K  --  3.7 3.9 3.5 3.8 4.2  CL  --  125* 125* 122* 118* 115*  CO2  --  '25 25 23 24 24  '$ GLUCOSE  --  184* 177* 204* 131* 179*  BUN  --  66* 64* 58* 50* 41*  CREATININE  --  3.06* 3.03* 2.60* 2.28* 2.10*  CALCIUM  --  8.7* 8.6* 8.5* 8.2* 8.2*  MG 2.6* 2.6*  --   --   --   --   PHOS  --  3.3  --   --   --   --    GFR: Estimated Creatinine Clearance: 20.4 mL/min (A) (by C-G formula based on SCr of 2.1 mg/dL (H)). Liver Function Tests: Recent Labs  Lab 03/27/22 1737 03/28/22 0515 03/29/22 0212 03/30/22 0022  AST 78* 136* 172* 449*  ALT 40 77* 136* 474*  ALKPHOS 46 47 43 56  BILITOT 1.1 0.9 1.0 0.5  PROT 7.5 6.6 6.1* 6.0*  ALBUMIN 2.3* 2.0*  1.8* 1.7*   No results for input(s): "LIPASE", "AMYLASE" in the last 168 hours. Recent Labs  Lab 03/27/22 1804  AMMONIA 53*   Coagulation Profile: Recent Labs  Lab 03/27/22 1737  INR 1.4*   Cardiac Enzymes: Recent Labs  Lab 03/27/22 1737 03/28/22 0515 03/29/22 0212  CKTOTAL 1,858* 1,844* 756*   BNP (last 3 results) No results for input(s): "PROBNP" in the last 8760 hours. HbA1C: Recent Labs    03/27/22 1737  HGBA1C 6.3*   CBG: Recent Labs  Lab 03/29/22 1716 03/29/22 2109 03/29/22 2136 03/29/22 2339 03/30/22 0802  GLUCAP 109* 57* 92 149* 136*  Lipid Profile: No results for input(s): "CHOL", "HDL", "LDLCALC", "TRIG", "CHOLHDL", "LDLDIRECT" in the last 72 hours. Thyroid Function Tests: Recent Labs    03/27/22 1804  TSH 4.841*  FREET4 1.01   Anemia Panel: No results for input(s): "VITAMINB12", "FOLATE", "FERRITIN", "TIBC", "IRON", "RETICCTPCT" in the last 72 hours. Sepsis Labs: Recent Labs  Lab 03/27/22 2002 03/28/22 0157 03/28/22 0515 03/28/22 0254  PROCALCITON  --   --  8.25  --   LATICACIDVEN 3.5* 1.5 1.5 1.9    Recent Results (from the past 240 hour(s))  Resp Panel by RT-PCR (Flu A&B, Covid) Anterior Nasal Swab     Status: None   Collection Time: 03/27/22  5:13 PM   Specimen: Anterior Nasal Swab  Result Value Ref Range Status   SARS Coronavirus 2 by RT PCR NEGATIVE NEGATIVE Final    Comment: (NOTE) SARS-CoV-2 target nucleic acids are NOT DETECTED.  The SARS-CoV-2 RNA is generally detectable in upper respiratory specimens during the acute phase of infection. The lowest concentration of SARS-CoV-2 viral copies this assay can detect is 138 copies/mL. A negative result does not preclude SARS-Cov-2 infection and should not be used as the sole basis for treatment or other patient management decisions. A negative result may occur with  improper specimen collection/handling, submission of specimen other than nasopharyngeal swab, presence of viral  mutation(s) within the areas targeted by this assay, and inadequate number of viral copies(<138 copies/mL). A negative result must be combined with clinical observations, patient history, and epidemiological information. The expected result is Negative.  Fact Sheet for Patients:  EntrepreneurPulse.com.au  Fact Sheet for Healthcare Providers:  IncredibleEmployment.be  This test is no t yet approved or cleared by the [place] FDA and  has been authorized for detection and/or diagnosis of SARS-CoV-2 by FDA under an Emergency Use Authorization (EUA). This EUA will remain  in effect (meaning this test can be used) for the duration of the COVID-19 declaration under Section 564(b)(1) of the Act, 21 U.S.C.section 360bbb-3(b)(1), unless the authorization is terminated  or revoked sooner.       Influenza A by PCR NEGATIVE NEGATIVE Final   Influenza B by PCR NEGATIVE NEGATIVE Final    Comment: (NOTE) The Xpert Xpress SARS-CoV-2/FLU/RSV plus assay is intended as an aid in the diagnosis of influenza from Nasopharyngeal swab specimens and should not be used as a sole basis for treatment. Nasal washings and aspirates are unacceptable for Xpert Xpress SARS-CoV-2/FLU/RSV testing.  Fact Sheet for Patients: EntrepreneurPulse.com.au  Fact Sheet for Healthcare Providers: IncredibleEmployment.be  This test is not yet approved or cleared by the [place] FDA and has been authorized for detection and/or diagnosis of SARS-CoV-2 by FDA under an Emergency Use Authorization (EUA). This EUA will remain in effect (meaning this test can be used) for the duration of the COVID-19 declaration under Section 564(b)(1) of the Act, 21 U.S.C. section 360bbb-3(b)(1), unless the authorization is terminated or revoked.  Performed at [facility], [place] [address] [place], [place] [address]   Urine Culture     Status: Abnormal    Collection Time: 03/27/22  5:13 PM   Specimen: In/Out Cath Urine  Result Value Ref Range Status   Specimen Description IN/OUT CATH URINE  Final   Special Requests NONE  Final   Culture (A)  Final    60,000 COLONIES/mL PROTEUS MIRABILIS >=100,000 COLONIES/mL AEROCOCCUS SPECIES Standardized susceptibility testing for this organism is not available. Performed at [facility], [place] [address], [place], [place]  [address]    Report Status 03/30/2022 FINAL  Final   Organism ID, Bacteria PROTEUS MIRABILIS (A)  Final      Susceptibility   Proteus mirabilis - MIC*    AMPICILLIN <=2 SENSITIVE Sensitive     CEFAZOLIN <=4 SENSITIVE Sensitive     CEFEPIME <=0.12 SENSITIVE Sensitive     CEFTRIAXONE <=0.25 SENSITIVE Sensitive     CIPROFLOXACIN <=0.25 SENSITIVE Sensitive     GENTAMICIN <=1 SENSITIVE Sensitive     IMIPENEM 2 SENSITIVE Sensitive     NITROFURANTOIN 128 RESISTANT Resistant     TRIMETH/SULFA <=20 SENSITIVE Sensitive     AMPICILLIN/SULBACTAM <=2 SENSITIVE Sensitive     PIP/TAZO <=4 SENSITIVE Sensitive     * 60,000 COLONIES/mL PROTEUS MIRABILIS  Blood Culture (routine x 2)     Status: None (Preliminary result)   Collection Time: 03/27/22  5:37 PM   Specimen: BLOOD  Result Value Ref Range Status   Specimen Description BLOOD RIGHT ANTECUBITAL  Final   Special Requests Blood Culture adequate volume  Final   Culture   Final    NO GROWTH 2 DAYS Performed at [facility], [place] [address], [place],  [address]    Report Status PENDING  Incomplete  Blood Culture (routine x 2)     Status: None (Preliminary result)   Collection Time: 03/27/22  6:03 PM   Specimen: BLOOD  Result Value Ref Range Status   Specimen Description BLOOD SITE NOT SPECIFIED  Final   Special Requests   Final    BOTTLES DRAWN AEROBIC AND ANAEROBIC Blood Culture adequate volume   Culture  Setup Time   Final    GRAM POSITIVE COCCI IN CLUSTERS IN BOTH AEROBIC AND ANAEROBIC BOTTLES CRITICAL RESULT  CALLED TO, READ BACK BY AND VERIFIED WITH: PHARMD G.BARR AT 4128 ON 03/29/2022 BY T.SAAD.    Culture   Final    GRAM POSITIVE COCCI IDENTIFICATION TO FOLLOW Performed at [facility], [place] [address] [place], [place] [address]    Report Status PENDING  Incomplete  Blood Culture ID Panel (Reflexed)     Status: None   Collection Time: 03/27/22  6:03 PM  Result Value Ref Range Status   Enterococcus faecalis NOT DETECTED NOT DETECTED Final   Enterococcus Faecium NOT DETECTED NOT DETECTED Final   Listeria monocytogenes NOT DETECTED NOT DETECTED Final   Staphylococcus species NOT DETECTED NOT DETECTED Final   Staphylococcus aureus (BCID) NOT DETECTED NOT DETECTED Final   Staphylococcus epidermidis NOT DETECTED NOT DETECTED Final   Staphylococcus lugdunensis NOT DETECTED NOT DETECTED Final   Streptococcus species NOT DETECTED NOT DETECTED Final   Streptococcus agalactiae NOT DETECTED NOT DETECTED Final   Streptococcus pneumoniae NOT DETECTED NOT DETECTED Final   Streptococcus pyogenes NOT DETECTED NOT DETECTED Final   A.calcoaceticus-baumannii NOT DETECTED NOT DETECTED Final   Bacteroides fragilis NOT DETECTED NOT DETECTED Final   Enterobacterales NOT DETECTED NOT DETECTED Final   Enterobacter cloacae complex NOT DETECTED NOT DETECTED Final   Escherichia coli NOT DETECTED NOT DETECTED Final   Klebsiella aerogenes NOT DETECTED NOT DETECTED Final   Klebsiella oxytoca NOT DETECTED NOT DETECTED Final   Klebsiella pneumoniae NOT DETECTED NOT DETECTED Final   Proteus species NOT DETECTED NOT DETECTED Final   Salmonella species NOT DETECTED NOT DETECTED Final   Serratia marcescens NOT DETECTED NOT DETECTED Final   Haemophilus influenzae NOT DETECTED NOT DETECTED Final   Neisseria meningitidis NOT DETECTED NOT DETECTED Final   Pseudomonas aeruginosa NOT  DETECTED NOT DETECTED Final   Stenotrophomonas maltophilia NOT DETECTED NOT DETECTED Final   Candida albicans NOT DETECTED NOT DETECTED  Final   Candida auris NOT DETECTED NOT DETECTED Final   Candida glabrata NOT DETECTED NOT DETECTED Final   Candida krusei NOT DETECTED NOT DETECTED Final   Candida parapsilosis NOT DETECTED NOT DETECTED Final   Candida tropicalis NOT DETECTED NOT DETECTED Final   Cryptococcus neoformans/gattii NOT DETECTED NOT DETECTED Final    Comment: Performed at [facility], [place] [address] [place], [place] [address]         Radiology Studies: ECHOCARDIOGRAM COMPLETE  Result Date: 03/28/2022    ECHOCARDIOGRAM REPORT   Patient Name:   Sergio [name] Date of Exam: 03/28/2022 Medical Rec #:  [id number]       Height:       65.0 in Accession #:    [id number]      Weight:       145.0 lb Date of Birth:  Aug 07, 1933       BSA:          1.725 m Patient Age:    44 years        BP:           161/73 mmHg Patient Gender: M               HR:           65 bpm. Exam Location:  Inpatient Procedure: 2D Echo Indications:    NSTEMI  History:        Patient has no prior history of Echocardiogram examinations,                 most recent 02/04/2017. CHF, chronic kidney disease,                 Signs/Symptoms:Altered Mental Status; Risk Factors:Hypertension,                 Diabetes and Dyslipidemia.  Sonographer:    Johny Chess RDCS Referring Phys: [id number] Rhetta Mura  Sonographer Comments: Image acquisition challenging due to uncooperative patient. IMPRESSIONS  1. Left ventricular ejection fraction, by estimation, is 65 to 70%. The left ventricle has normal function. The left ventricle has no regional wall motion abnormalities. Left ventricular diastolic parameters are indeterminate.  2. Right ventricular systolic function is normal. The right ventricular size is normal. Tricuspid regurgitation signal is inadequate for assessing PA pressure.  3. The mitral valve is grossly normal. Trivial mitral valve regurgitation.  4. The aortic valve is tricuspid. There is mild calcification of the aortic valve. Aortic valve  regurgitation is not visualized.  5. The inferior vena cava is normal in size with greater than 50% respiratory variability, suggesting right atrial pressure of 3 mmHg. Comparison(s): Prior images unable to be directly viewed. FINDINGS  Left Ventricle: Left ventricular ejection fraction, by estimation, is 65 to 70%. The left ventricle has normal function. The left ventricle has no regional wall motion abnormalities. The left ventricular internal cavity size was normal in size. There is  borderline left ventricular hypertrophy. Left ventricular diastolic parameters are indeterminate. Right Ventricle: The right ventricular size is normal. No increase in right ventricular wall thickness. Right ventricular systolic function is normal. Tricuspid regurgitation signal is inadequate for assessing PA pressure. Left Atrium: Left atrial size was normal in size. Right Atrium: Right atrial size was normal in size. Pericardium: The pericardium was not well visualized. Mitral Valve: The mitral valve is grossly  normal. Mild mitral annular calcification. Trivial mitral valve regurgitation. Tricuspid Valve: The tricuspid valve is grossly normal. Tricuspid valve regurgitation is trivial. Aortic Valve: The aortic valve is tricuspid. There is mild calcification of the aortic valve. Aortic valve regurgitation is not visualized. Pulmonic Valve: The pulmonic valve was not well visualized. Pulmonic valve regurgitation is trivial. Aorta: The aortic root is normal in size and structure. Venous: The inferior vena cava is normal in size with greater than 50% respiratory variability, suggesting right atrial pressure of 3 mmHg. IAS/Shunts: No atrial level shunt detected by color flow Doppler.  LEFT VENTRICLE PLAX 2D LVIDd:         4.20 cm   Diastology LVIDs:         2.40 cm   LV e' medial:    5.55 cm/s LV PW:         0.90 cm   LV E/e' medial:  9.5 LV IVS:        1.00 cm   LV e' lateral:   7.40 cm/s LVOT diam:     1.80 cm   LV E/e' lateral: 7.1 LV  SV:         49 LV SV Index:   29 LVOT Area:     2.54 cm  RIGHT VENTRICLE             IVC RV S prime:     12.90 cm/s  IVC diam: 1.30 cm TAPSE (M-mode): 1.3 cm LEFT ATRIUM             Index        RIGHT ATRIUM          Index LA diam:        3.30 cm 1.91 cm/m   RA Area:     9.48 cm LA Vol (A2C):   39.7 ml 23.01 ml/m  RA Volume:   20.00 ml 11.59 ml/m LA Vol (A4C):   30.6 ml 17.73 ml/m LA Biplane Vol: 36.4 ml 21.10 ml/m  AORTIC VALVE LVOT Vmax:   91.30 cm/s LVOT Vmean:  61.600 cm/s LVOT VTI:    0.194 m  AORTA Ao Root diam: 3.10 cm MITRAL VALVE MV Area (PHT): 2.48 cm    SHUNTS MV Decel Time: 306 msec    Systemic VTI:  0.19 m MV E velocity: 52.80 cm/s  Systemic Diam: 1.80 cm MV A velocity: 86.60 cm/s MV E/A ratio:  0.61 Rozann Lesches MD Electronically signed by Rozann Lesches MD Signature Date/Time: 03/28/2022/1:28:41 PM    Final         Scheduled Meds:  tamsulosin  0.4 mg Oral Daily   Continuous Infusions:  cefTRIAXone (ROCEPHIN)  IV 1 g (03/29/22 1730)   heparin 1,050 Units/hr (03/30/22 0757)     LOS: 3 days    Time spent: 45 minutes spent on chart review, discussion with nursing staff, consultants, updating family and interview/physical exam; more than 50% of that time was spent in counseling and/or coordination of care.    Geradine Girt, DO [facility] Available via Epic secure chat 7am-7pm After these hours, please refer to coverage provider listed on [email or web address] 03/30/2022, 11:29 AM

## 2022-03-30 NOTE — Progress Notes (Signed)
ANTICOAGULATION CONSULT NOTE - Follow Up Consult  Pharmacy Consult for IV heparin Indication: chest pain/ACS  No Known Allergies  Patient Measurements: Height: '5\' 5"'$  (165.1 cm) Weight: 58.2 kg (128 lb 4.9 oz) IBW/kg (Calculated) : 61.5 Heparin Dosing Weight: 65 kg  Vital Signs: Temp: 97.8 F (36.6 C) (07/03 0324) Temp Source: Oral (07/03 0324) BP: 145/66 (07/03 0324) Pulse Rate: 82 (07/03 0324)  Labs: Recent Labs    03/27/22 1737 03/27/22 1810 03/27/22 2002 03/28/22 0515 03/28/22 0607 03/28/22 1603 03/29/22 0212 03/29/22 1704 03/30/22 0022  HGB 13.0   < >  --  12.0*  --   --  11.0*  --  10.2*  HCT 42.4   < >  --  38.2*  --   --  35.6*  --  32.1*  PLT 235  --   --  173  --   --  124*  --  109*  LABPROT 16.7*  --   --   --   --   --   --   --   --   INR 1.4*  --   --   --   --   --   --   --   --   HEPARINUNFRC  --   --   --   --    < > 0.41 0.28* 0.26* 0.40  CREATININE 4.43*  --   --  3.06*   < > 2.60* 2.28*  --  2.10*  CKTOTAL 1,858*  --   --  1,844*  --   --  756*  --   --   TROPONINIHS 2,634*  --  5,593* 14,045*  --  5,816*  --   --   --    < > = values in this interval not displayed.     Estimated Creatinine Clearance: 20.4 mL/min (A) (by C-G formula based on SCr of 2.1 mg/dL (H)).   Medications:  Infusions:   cefTRIAXone (ROCEPHIN)  IV 1 g (03/29/22 1730)   dextrose 100 mL/hr at 03/30/22 0729   heparin 1,050 Units/hr (03/29/22 2015)    Assessment: 20 yom with a history of HTN, T2DM, hx of PE previously eliquis but has not been taking per son secondary to access issues, prior chronic foley. Patient is presenting with AMS. Patient is not on anticoagulation prior to arrival. Heparin per pharmacy consult placed for chest pain/ACS. Trops up to 14k and trending down. Suspecting multifactorial Type 2 NSTEMI.  Heparin level therapeutic this AM  Goal of Therapy:  Heparin level 0.3-0.7 units/ml Monitor platelets by anticoagulation protocol: Yes   Plan:   Continue heparin at 1050 units / hr Follow up AM labs  Thank you Anette Guarneri, PharmD 03/30/2022 7:53 AM  **Pharmacist phone directory can be found on [email or web address] listed under [place]**

## 2022-03-30 NOTE — Progress Notes (Signed)
Hypoglycemic Event  CBG: 57  Treatment: 8 oz juice/soda  Symptoms: None  Follow-up CBG: Time: 2136 CBG Result:92  Possible Reasons for Event: Inadequate meal intake  Comments/MD notified:Hypoglycemic protocol interventions were successful.  MD not notified at this time.     Felecia Jan

## 2022-03-30 NOTE — Evaluation (Signed)
Physical Therapy Evaluation Patient Details Name: Sergio [name] MRN: [id number] DOB: 14-Feb-1933 Today's Date: 03/30/2022  History of Present Illness  88 yo male with onset of AMS was admitted on 6/30 and found to have hypernatremia related to dehydration, elevation of Ca+. and had NSTEMI suspected but now determined to be elevation of troponin from sepsis of UTI and AKI.  Pt is in acute metabolic encephalopathy, acute renal failure and has elevated LFT's.  PMHx:  chronic urinary retention, CKD3b, DM, CHF, HTN,  Clinical Impression  Pt was seen for initial evaluation with history being a challenge with no family available and his history not being accessible through pt.  He is quite restricted for independent standing and gait with general strength and balance losses.  Pt is also confused and struggling to follow along with sequences of movement, resulting in ineffective ability to initiate LE power up and use UE's effectively for support.  Pt is potentially not ambulatory at baseline, but will attempt to get further information on his history as it becomes available.       Recommendations for follow up therapy are one component of a multi-disciplinary discharge planning process, led by the attending physician.  Recommendations may be updated based on patient status, additional functional criteria and insurance authorization.  Follow Up Recommendations Skilled nursing-short term rehab (<3 hours/day) Can patient physically be transported by private vehicle: No    Assistance Recommended at Discharge Frequent or constant Supervision/Assistance  Patient can return home with the following  Two people to help with walking and/or transfers;A lot of help with bathing/dressing/bathroom;Assistance with cooking/housework;Assistance with feeding;Direct supervision/assist for medications management;Direct supervision/assist for financial management;Assist for transportation;Help with stairs or ramp for  entrance    Equipment Recommendations None recommended by PT  Recommendations for Other Services       Functional Status Assessment Patient has had a recent decline in their functional status and demonstrates the ability to make significant improvements in function in a reasonable and predictable amount of time.     Precautions / Restrictions Precautions Precautions: Fall Precaution Comments: confusion, LE wounds Restrictions Weight Bearing Restrictions: No      Mobility  Bed Mobility Overal bed mobility: Needs Assistance Bed Mobility: Supine to Sit, Sit to Supine     Supine to sit: Max assist Sit to supine: Total assist   General bed mobility comments: pt was tired out just from attempting to stand, asking to return to bed instead of getting up in chair    Transfers Overall transfer level: Needs assistance Equipment used: Rolling walker (2 wheels), 1 person hand held assist Transfers: Sit to/from Stand Sit to Stand: Total assist           General transfer comment: total assist with pt demonstrating minimal discernible effort to power up    Ambulation/Gait               General Gait Details: unable to attempt  Stairs            Wheelchair Mobility    Modified Rankin (Stroke Patients Only)       Balance Overall balance assessment: Needs assistance Sitting-balance support: Feet supported, Bilateral upper extremity supported Sitting balance-Leahy Scale: Fair                                       Pertinent Vitals/Pain Pain Assessment Pain Assessment: Faces Faces Pain Scale: Hurts a  little bit Breathing: normal Negative Vocalization: none Pain Location: generalized movement    Home Living Family/patient expects to be discharged to:: Skilled nursing facility                   Additional Comments: repetitive answers that pt cannot remember    Prior Function Prior Level of Function : Patient poor historian/Family  not available             Mobility Comments: pt  unable to give effective history       Hand Dominance        Extremity/Trunk Assessment   Upper Extremity Assessment Upper Extremity Assessment: Generalized weakness    Lower Extremity Assessment Lower Extremity Assessment: Generalized weakness    Cervical / Trunk Assessment Cervical / Trunk Assessment: Kyphotic  Communication   Communication: Expressive difficulties  Cognition Arousal/Alertness: Awake/alert Behavior During Therapy: Flat affect Overall Cognitive Status: Impaired/Different from baseline Area of Impairment: Problem solving, Awareness, Safety/judgement, Following commands, Memory, Attention, Orientation                 Orientation Level: Situation, Time, Place Current Attention Level: Selective Memory: Decreased short-term memory, Decreased recall of precautions Following Commands: Follows one step commands with increased time Safety/Judgement: Decreased awareness of deficits, Decreased awareness of safety Awareness: Intellectual Problem Solving: Slow processing, Decreased initiation, Difficulty sequencing, Requires verbal cues, Requires tactile cues General Comments: pt is generally unable to verbalize about hx and his motor skills are a struggle to demonstrate transfers with initiation of LE power        General Comments General comments (skin integrity, edema, etc.): pt is weak and generally unable to initiate good quality movement.  Has no baseline information he can share with PT    Exercises     Assessment/Plan    PT Assessment Patient needs continued PT services  PT Problem List Decreased strength;Decreased range of motion;Decreased activity tolerance;Decreased balance;Decreased mobility;Decreased coordination;Decreased cognition;Decreased knowledge of use of DME;Decreased safety awareness;Decreased skin integrity;Pain       PT Treatment Interventions Gait training;DME  instruction;Functional mobility training;Therapeutic activities;Therapeutic exercise;Balance training;Neuromuscular re-education;Patient/family education    PT Goals (Current goals can be found in the Care Plan section)  Acute Rehab PT Goals Patient Stated Goal: none stated PT Goal Formulation: Patient unable to participate in goal setting Time For Goal Achievement: 04/13/22 Potential to Achieve Goals: Fair    Frequency Min 2X/week     Co-evaluation               AM-PAC PT "6 Clicks" Mobility  Outcome Measure Help needed turning from your back to your side while in a flat bed without using bedrails?: A Lot Help needed moving from lying on your back to sitting on the side of a flat bed without using bedrails?: A Lot Help needed moving to and from a bed to a chair (including a wheelchair)?: A Lot Help needed standing up from a chair using your arms (e.g., wheelchair or bedside chair)?: Total Help needed to walk in hospital room?: Total Help needed climbing 3-5 steps with a railing? : Total 6 Click Score: 9    End of Session Equipment Utilized During Treatment: Gait belt Activity Tolerance: Patient limited by fatigue;Treatment limited secondary to medical complications (Comment) Patient left: in bed;with call bell/phone within reach;with bed alarm set Nurse Communication: Mobility status PT Visit Diagnosis: Muscle weakness (generalized) (M62.81);Other abnormalities of gait and mobility (R26.89);Difficulty in walking, not elsewhere classified (R26.2);Pain    Time: 4270-6237 PT  Time Calculation (min) (ACUTE ONLY): 28 min   Charges:   PT Evaluation $PT Eval Moderate Complexity: 1 Mod PT Treatments $Therapeutic Activity: 8-22 mins       Ramond Dial 03/30/2022, 4:36 PM  Mee Hives, PT PhD Acute Rehab Dept. Number: [place] and [place]

## 2022-03-30 NOTE — Progress Notes (Signed)
  Transition of Care [facility]) Screening Note   Patient Details  Name: Sergio [name] Date of Birth: 09-05-1933   Transition of Care [facility]) CM/SW Contact:    Cyndi Bender, RN Phone Number: 03/30/2022, 8:39 AM    Transition of Care Department [facility]) has reviewed patient and no TOC needs have been identified at this time. We will continue to monitor patient advancement through interdisciplinary progression rounds. If new patient transition needs arise, please place a TOC consult.

## 2022-03-31 DIAGNOSIS — E870 Hyperosmolality and hypernatremia: Secondary | ICD-10-CM | POA: Diagnosis not present

## 2022-03-31 LAB — HEPATITIS PANEL, ACUTE
HCV Ab: NONREACTIVE
Hep A IgM: NONREACTIVE
Hep B C IgM: NONREACTIVE
Hepatitis B Surface Ag: NONREACTIVE

## 2022-03-31 LAB — COMPREHENSIVE METABOLIC PANEL
ALT: 334 U/L — ABNORMAL HIGH (ref 0–44)
AST: 189 U/L — ABNORMAL HIGH (ref 15–41)
Albumin: 1.6 g/dL — ABNORMAL LOW (ref 3.5–5.0)
Alkaline Phosphatase: 49 U/L (ref 38–126)
Anion gap: 6 (ref 5–15)
BUN: 27 mg/dL — ABNORMAL HIGH (ref 8–23)
CO2: 24 mmol/L (ref 22–32)
Calcium: 7.7 mg/dL — ABNORMAL LOW (ref 8.9–10.3)
Chloride: 114 mmol/L — ABNORMAL HIGH (ref 98–111)
Creatinine, Ser: 1.6 mg/dL — ABNORMAL HIGH (ref 0.61–1.24)
GFR, Estimated: 41 mL/min — ABNORMAL LOW (ref >60)
Glucose, Bld: 163 mg/dL — ABNORMAL HIGH (ref 70–99)
Potassium: 3.3 mmol/L — ABNORMAL LOW (ref 3.5–5.1)
Sodium: 144 mmol/L (ref 135–145)
Total Bilirubin: 0.4 mg/dL (ref 0.3–1.2)
Total Protein: 5.5 g/dL — ABNORMAL LOW (ref 6.5–8.1)

## 2022-03-31 LAB — CBC
HCT: 28.1 % — ABNORMAL LOW (ref 39.0–52.0)
Hemoglobin: 9.1 g/dL — ABNORMAL LOW (ref 13.0–17.0)
MCH: 28 pg (ref 26.0–34.0)
MCHC: 32.4 g/dL (ref 30.0–36.0)
MCV: 86.5 fL (ref 80.0–100.0)
Platelets: 115 10*3/uL — ABNORMAL LOW (ref 150–400)
RBC: 3.25 MIL/uL — ABNORMAL LOW (ref 4.22–5.81)
RDW: 14.6 % (ref 11.5–15.5)
WBC: 11.6 10*3/uL — ABNORMAL HIGH (ref 4.0–10.5)
nRBC: 0 % (ref 0.0–0.2)

## 2022-03-31 LAB — HEPARIN LEVEL (UNFRACTIONATED): Heparin Unfractionated: 0.36 IU/mL (ref 0.30–0.70)

## 2022-03-31 LAB — GLUCOSE, CAPILLARY
Glucose-Capillary: 149 mg/dL — ABNORMAL HIGH (ref 70–99)
Glucose-Capillary: 222 mg/dL — ABNORMAL HIGH (ref 70–99)
Glucose-Capillary: 118 mg/dL — ABNORMAL HIGH (ref 70–99)
Glucose-Capillary: 200 mg/dL — ABNORMAL HIGH (ref 70–99)

## 2022-03-31 MED ORDER — INSULIN ASPART 100 UNIT/ML IJ SOLN
0.0000 [IU] | Freq: Three times a day (TID) | INTRAMUSCULAR | Status: DC
  Administered 2022-03-31 – 2022-04-01 (×3): 2 [IU] via SUBCUTANEOUS
  Administered 2022-04-01: 1 [IU] via SUBCUTANEOUS
  Administered 2022-04-02 (×2): 2 [IU] via SUBCUTANEOUS
  Administered 2022-04-02: 1 [IU] via SUBCUTANEOUS
  Administered 2022-04-02 – 2022-04-03 (×2): 2 [IU] via SUBCUTANEOUS
  Administered 2022-04-03: 1 [IU] via SUBCUTANEOUS
  Administered 2022-04-03: 3 [IU] via SUBCUTANEOUS
  Administered 2022-04-03: 1 [IU] via SUBCUTANEOUS
  Administered 2022-04-04: 2 [IU] via SUBCUTANEOUS
  Administered 2022-04-05: 1 [IU] via SUBCUTANEOUS
  Administered 2022-04-05: 2 [IU] via SUBCUTANEOUS
  Administered 2022-04-06 (×3): 1 [IU] via SUBCUTANEOUS
  Administered 2022-04-07: 2 [IU] via SUBCUTANEOUS
  Administered 2022-04-07: 3 [IU] via SUBCUTANEOUS
  Administered 2022-04-07: 2 [IU] via SUBCUTANEOUS
  Administered 2022-04-08 – 2022-04-09 (×4): 1 [IU] via SUBCUTANEOUS
  Administered 2022-04-09: 3 [IU] via SUBCUTANEOUS

## 2022-03-31 MED ORDER — POTASSIUM CHLORIDE 10 MEQ/100ML IV SOLN
10.0000 meq | INTRAVENOUS | Status: AC
  Administered 2022-03-31 (×3): 10 meq via INTRAVENOUS
  Filled 2022-03-31 (×3): qty 100

## 2022-03-31 MED ORDER — HEPARIN SODIUM (PORCINE) 5000 UNIT/ML IJ SOLN
5000.0000 [IU] | Freq: Three times a day (TID) | INTRAMUSCULAR | Status: DC
  Administered 2022-03-31 – 2022-04-03 (×9): 5000 [IU] via SUBCUTANEOUS
  Filled 2022-03-31 (×9): qty 1

## 2022-03-31 NOTE — Progress Notes (Signed)
ANTICOAGULATION CONSULT NOTE - Follow Up Consult  Pharmacy Consult for IV heparin Indication: chest pain/ACS  No Known Allergies  Patient Measurements: Height: '5\' 5"'$  (165.1 cm) Weight: 58.2 kg (128 lb 4.9 oz) IBW/kg (Calculated) : 61.5 Heparin Dosing Weight: 65 kg  Vital Signs: Temp: 98 F (36.7 C) (07/04 0311) Temp Source: Oral (07/04 0311) BP: 159/67 (07/04 0311) Pulse Rate: 68 (07/04 0311)  Labs: Recent Labs    03/28/22 1603 03/28/22 1603 03/29/22 0212 03/29/22 1704 03/30/22 0022 03/31/22 0125  HGB  --    < > 11.0*  --  10.2* 9.1*  HCT  --   --  35.6*  --  32.1* 28.1*  PLT  --   --  124*  --  109* 115*  HEPARINUNFRC 0.41  --  0.28* 0.26* 0.40 0.36  CREATININE 2.60*  --  2.28*  --  2.10* 1.60*  CKTOTAL  --   --  756*  --   --   --   TROPONINIHS 5,816*  --   --   --   --   --    < > = values in this interval not displayed.     Estimated Creatinine Clearance: 26.8 mL/min (A) (by C-G formula based on SCr of 1.6 mg/dL (H)).   Medications:  Infusions:   cefTRIAXone (ROCEPHIN)  IV 200 mL/hr at 03/31/22 0018   dextrose 100 mL/hr at 03/31/22 0208   heparin 1,050 Units/hr (03/30/22 0757)    Assessment: 54 yom with a history of HTN, T2DM, hx of PE previously eliquis but has not been taking per son secondary to access issues, prior chronic foley. Patient is presenting with AMS. Patient is not on anticoagulation prior to arrival. Heparin per pharmacy consult placed for chest pain/ACS. Trops up to 14k and trending down.  Suspecting multifactorial Type 2 NSTEMI.  Heparin level therapeutic this AM  Goal of Therapy:  Heparin level 0.3-0.7 units/ml Monitor platelets by anticoagulation protocol: Yes   Plan:  Continue heparin at 1050 units / hr - started 6/30 Follow up AM labs  Thank you Anette Guarneri, PharmD 03/31/2022 7:34 AM  **Pharmacist phone directory can be found on [email or web address] listed under [place]**

## 2022-03-31 NOTE — Progress Notes (Signed)
PROGRESS NOTE    Sergio [name]  XNA:[id number] DOB: 02/21/1933 DOA: 03/27/2022 PCP: Forrest Moron, MD    Brief Narrative:  Sergio [name] is a 88 y.o. male with medical history significant for type 2 diabetes mellitus, essential hypertension, BPH, stage IIIb chronic kidney disease associated baseline creatinine range 5.4-2.7, chronic diastolic heart failure, who is admitted to [facility] on 03/27/2022 with acute hypernatremia after presenting from home to [facility] ED for evaluation of altered mental status.  Slowly improving.  I have been unable to reach family-- patient says he lives with this family.     Assessment and Plan: * Hypernatremia -Acute Hypovolemic hypernatremia: -Suggestive of free water deficiency in the context of dehydration stemming from family's report of the patient exhibiting evidence of significant decline in oral intake of both food and water over the course of the last week.   -improved -BMP daily -mental status much improved on AM of 7/2 but unclear what baseline is -patient eating 7/3  chronic diastolic CHF (congestive heart failure) ([place]) -most recent echocardiogram performed in May 2018 notable for LVEF 55 to 60% as well as grade 1 diastolic dysfunction, -No clinical or radiographic evidence to suggest acutely decompensated heart failure at this time. - monitor strict I's & O's and daily weights. -repeat echo similar to prior  Hypercalcemia -improved    NSTEMI (non-ST elevated myocardial infarction) ([place]) -Type 2 NSTEMI: - Presenting EKG shows no evidence of acute T wave or ST changes relative to most recent prior EKG from April 2023, including no evidence of ST elevation, as further detailed above. suspect that this elevated troponin is on the basis of supply demand mismatch in the setting of severe sepsis due to urinary tract infection, significant dehydration, as well as significant decline in renal clearance as a consequence of AKI on CKD 3B, as  opposed to representing a type I process due to acute plaque rupture.  -heparin gtt x 3 days -echo done -will hold on cards consult as not sure he would be a candidate for intervention based on Cr/other co-morbidities    Acute on chronic urinary retention - History of urinary retention: Documented history of such, including documented history of BPH for which the patient was previously on tamsulosin as well as per chart review revealing a history of urethral balloon dilation in July 2022.   -foley placed in ER -voiding trial when patient improved  Acute metabolic encephalopathy - Progressive confusion/somnolence over the course of the last week, which appears multifactorial in nature, with metabolic contributions from severe sepsis due to UTI as well as acute hyponatremia, dehydration, acute kidney injury and mild hypercalcemia.  -unclear what the baseline is for this patient as family is MIA  Severe sepsis ([place]) - Severe sepsis due to urinary tract infection: - Blood cultures x2 : 1/2 gram + cocci- suspected contaminant  -urine culture collected prior to initiation of IV Rocephin- proteus -CT abdomen/pelvis, aside from showing evidence of mild bladder wall thickening, consistent with suspected acute cystitis, no additional evidence of acute intra-abdominal/intrapelvic process.  Acute renal failure superimposed on stage 3b chronic kidney disease (HCC)  -Monitor strict I's and O's and daily weights.   -avoid nephrotoxic agents.    DM2 (diabetes mellitus, type 2) (HCC) - accuchecks QAC and HS with low dose SSI.   - hemoglobin A1c: 6.3  Elevated LFTs -? Shock liver Viral hepatitis panel -trending down now -no RUQ pain  Hypokalemia -replete  H/o PE -per MAR not on  eliquis at home anymore-- unable to clarify with family  DVT prophylaxis: heparin injection 5,000 Units Start: 03/31/22 1400 SCDs Start: 03/27/22 2044    Code Status: Full Code   Disposition Plan:  Level of care:  Telemetry Medical Status is: Inpatient Remains inpatient appropriate because: needs correction of Na  LM for son x3  Consultants:  none   Subjective: No complaints  Objective: Vitals:   03/31/22 0000 03/31/22 0311 03/31/22 0807 03/31/22 1204  BP: (!) 153/67 (!) 159/67 (!) 157/66 (!) 136/54  Pulse: 70 68 77 78  Resp: '19 14 20 18  '$ Temp: 98.2 F (36.8 C) 98 F (36.7 C) 97.8 F (36.6 C) 98.9 F (37.2 C)  TempSrc: Oral Oral Oral Axillary  SpO2: 100% 100% 100% 100%  Weight:      Height:        Intake/Output Summary (Last 24 hours) at 03/31/2022 1254 Last data filed at 03/31/2022 1204 Gross per 24 hour  Intake 1310.03 ml  Output 3750 ml  Net -2439.97 ml   Filed Weights   03/27/22 2151 03/29/22 0500 03/30/22 0427  Weight: 65.8 kg 57.7 kg 58.2 kg    Examination:    General: Appearance:    elderly male in no acute distress     Lungs:    respirations unlabored  Heart:    Normal heart rate. Normal rhythm. No murmurs, rubs, or gallops.   MS:   All extremities are intact.   Neurologic:   Awake, alert       Data Reviewed: I have personally reviewed following labs and imaging studies  CBC: Recent Labs  Lab 03/27/22 1737 03/27/22 1810 03/28/22 0515 03/29/22 0212 03/30/22 0022 03/31/22 0125  WBC 20.1*  --  20.4* 17.2* 15.0* 11.6*  NEUTROABS 18.3*  --  17.5*  --   --   --   HGB 13.0 12.9* 12.0* 11.0* 10.2* 9.1*  HCT 42.4 38.0* 38.2* 35.6* 32.1* 28.1*  MCV 89.6  --  89.0 89.0 88.9 86.5  PLT 235  --  173 124* 109* 536*   Basic Metabolic Panel: Recent Labs  Lab 03/27/22 2002 03/28/22 0515 03/28/22 0912 03/28/22 1603 03/29/22 0212 03/30/22 0022 03/31/22 0125  NA  --  158* 159* 155* 153* 149* 144  K  --  3.7 3.9 3.5 3.8 4.2 3.3*  CL  --  125* 125* 122* 118* 115* 114*  CO2  --  '25 25 23 24 24 24  '$ GLUCOSE  --  184* 177* 204* 131* 179* 163*  BUN  --  66* 64* 58* 50* 41* 27*  CREATININE  --  3.06* 3.03* 2.60* 2.28* 2.10* 1.60*  CALCIUM  --  8.7* 8.6* 8.5*  8.2* 8.2* 7.7*  MG 2.6* 2.6*  --   --   --   --   --   PHOS  --  3.3  --   --   --   --   --    GFR: Estimated Creatinine Clearance: 26.8 mL/min (A) (by C-G formula based on SCr of 1.6 mg/dL (H)). Liver Function Tests: Recent Labs  Lab 03/27/22 1737 03/28/22 0515 03/29/22 0212 03/30/22 0022 03/31/22 0125  AST 78* 136* 172* 449* 189*  ALT 40 77* 136* 474* 334*  ALKPHOS 46 47 43 56 49  BILITOT 1.1 0.9 1.0 0.5 0.4  PROT 7.5 6.6 6.1* 6.0* 5.5*  ALBUMIN 2.3* 2.0* 1.8* 1.7* 1.6*   No results for input(s): "LIPASE", "AMYLASE" in the last 168 hours. Recent Labs  Lab 03/27/22  1804  AMMONIA 53*   Coagulation Profile: Recent Labs  Lab 03/27/22 1737  INR 1.4*   Cardiac Enzymes: Recent Labs  Lab 03/27/22 1737 03/28/22 0515 03/29/22 0212  CKTOTAL 1,858* 1,844* 756*   BNP (last 3 results) No results for input(s): "PROBNP" in the last 8760 hours. HbA1C: No results for input(s): "HGBA1C" in the last 72 hours.  CBG: Recent Labs  Lab 03/30/22 1212 03/30/22 1627 03/30/22 2147 03/31/22 0804 03/31/22 1202  GLUCAP 230* 163* 180* 149* 222*   Lipid Profile: No results for input(s): "CHOL", "HDL", "LDLCALC", "TRIG", "CHOLHDL", "LDLDIRECT" in the last 72 hours. Thyroid Function Tests: No results for input(s): "TSH", "T4TOTAL", "FREET4", "T3FREE", "THYROIDAB" in the last 72 hours.  Anemia Panel: No results for input(s): "VITAMINB12", "FOLATE", "FERRITIN", "TIBC", "IRON", "RETICCTPCT" in the last 72 hours. Sepsis Labs: Recent Labs  Lab 03/27/22 2002 03/28/22 0157 03/28/22 0515 03/28/22 0093  PROCALCITON  --   --  8.25  --   LATICACIDVEN 3.5* 1.5 1.5 1.9    Recent Results (from the past 240 hour(s))  Resp Panel by RT-PCR (Flu A&B, Covid) Anterior Nasal Swab     Status: None   Collection Time: 03/27/22  5:13 PM   Specimen: Anterior Nasal Swab  Result Value Ref Range Status   SARS Coronavirus 2 by RT PCR NEGATIVE NEGATIVE Final    Comment: (NOTE) SARS-CoV-2 target  nucleic acids are NOT DETECTED.  The SARS-CoV-2 RNA is generally detectable in upper respiratory specimens during the acute phase of infection. The lowest concentration of SARS-CoV-2 viral copies this assay can detect is 138 copies/mL. A negative result does not preclude SARS-Cov-2 infection and should not be used as the sole basis for treatment or other patient management decisions. A negative result may occur with  improper specimen collection/handling, submission of specimen other than nasopharyngeal swab, presence of viral mutation(s) within the areas targeted by this assay, and inadequate number of viral copies(<138 copies/mL). A negative result must be combined with clinical observations, patient history, and epidemiological information. The expected result is Negative.  Fact Sheet for Patients:  EntrepreneurPulse.com.au  Fact Sheet for Healthcare Providers:  IncredibleEmployment.be  This test is no t yet approved or cleared by the [place] FDA and  has been authorized for detection and/or diagnosis of SARS-CoV-2 by FDA under an Emergency Use Authorization (EUA). This EUA will remain  in effect (meaning this test can be used) for the duration of the COVID-19 declaration under Section 564(b)(1) of the Act, 21 U.S.C.section 360bbb-3(b)(1), unless the authorization is terminated  or revoked sooner.       Influenza A by PCR NEGATIVE NEGATIVE Final   Influenza B by PCR NEGATIVE NEGATIVE Final    Comment: (NOTE) The Xpert Xpress SARS-CoV-2/FLU/RSV plus assay is intended as an aid in the diagnosis of influenza from Nasopharyngeal swab specimens and should not be used as a sole basis for treatment. Nasal washings and aspirates are unacceptable for Xpert Xpress SARS-CoV-2/FLU/RSV testing.  Fact Sheet for Patients: EntrepreneurPulse.com.au  Fact Sheet for Healthcare  Providers: IncredibleEmployment.be  This test is not yet approved or cleared by the [place] FDA and has been authorized for detection and/or diagnosis of SARS-CoV-2 by FDA under an Emergency Use Authorization (EUA). This EUA will remain in effect (meaning this test can be used) for the duration of the COVID-19 declaration under Section 564(b)(1) of the Act, 21 U.S.C. section 360bbb-3(b)(1), unless the authorization is terminated or revoked.  Performed at [facility], [place]  [address] [place], [place] [address]   Urine Culture     Status: Abnormal   Collection Time: 03/27/22  5:13 PM   Specimen: In/Out Cath Urine  Result Value Ref Range Status   Specimen Description IN/OUT CATH URINE  Final   Special Requests NONE  Final   Culture (A)  Final    60,000 COLONIES/mL PROTEUS MIRABILIS >=100,000 COLONIES/mL AEROCOCCUS SPECIES Standardized susceptibility testing for this organism is not available. Performed at [facility], [place] [address], [place], [place] [address]    Report Status 03/30/2022 FINAL  Final   Organism ID, Bacteria PROTEUS MIRABILIS (A)  Final      Susceptibility   Proteus mirabilis - MIC*    AMPICILLIN <=2 SENSITIVE Sensitive     CEFAZOLIN <=4 SENSITIVE Sensitive     CEFEPIME <=0.12 SENSITIVE Sensitive     CEFTRIAXONE <=0.25 SENSITIVE Sensitive     CIPROFLOXACIN <=0.25 SENSITIVE Sensitive     GENTAMICIN <=1 SENSITIVE Sensitive     IMIPENEM 2 SENSITIVE Sensitive     NITROFURANTOIN 128 RESISTANT Resistant     TRIMETH/SULFA <=20 SENSITIVE Sensitive     AMPICILLIN/SULBACTAM <=2 SENSITIVE Sensitive     PIP/TAZO <=4 SENSITIVE Sensitive     * 60,000 COLONIES/mL PROTEUS MIRABILIS  Blood Culture (routine x 2)     Status: None (Preliminary result)   Collection Time: 03/27/22  5:37 PM   Specimen: BLOOD  Result Value Ref Range Status   Specimen Description BLOOD RIGHT ANTECUBITAL  Final   Special Requests Blood Culture adequate volume   Final   Culture   Final    NO GROWTH 4 DAYS Performed at [facility], [place] [address], [place], [place] [address]    Report Status PENDING  Incomplete  Blood Culture (routine x 2)     Status: None (Preliminary result)   Collection Time: 03/27/22  6:03 PM   Specimen: BLOOD  Result Value Ref Range Status   Specimen Description BLOOD SITE NOT SPECIFIED  Final   Special Requests   Final    BOTTLES DRAWN AEROBIC AND ANAEROBIC Blood Culture adequate volume   Culture  Setup Time   Final    GRAM POSITIVE COCCI IN CLUSTERS IN BOTH AEROBIC AND ANAEROBIC BOTTLES CRITICAL RESULT CALLED TO, READ BACK BY AND VERIFIED WITH: PHARMD G.BARR AT 8144 ON 03/29/2022 BY T.SAAD.    Culture   Final    GRAM POSITIVE COCCI IDENTIFICATION TO FOLLOW REPEATING Performed at [facility], [place] [address], [place], [place] [address]    Report Status PENDING  Incomplete  Blood Culture ID Panel (Reflexed)     Status: None   Collection Time: 03/27/22  6:03 PM  Result Value Ref Range Status   Enterococcus faecalis NOT DETECTED NOT DETECTED Final   Enterococcus Faecium NOT DETECTED NOT DETECTED Final   Listeria monocytogenes NOT DETECTED NOT DETECTED Final   Staphylococcus species NOT DETECTED NOT DETECTED Final   Staphylococcus aureus (BCID) NOT DETECTED NOT DETECTED Final   Staphylococcus epidermidis NOT DETECTED NOT DETECTED Final   Staphylococcus lugdunensis NOT DETECTED NOT DETECTED Final   Streptococcus species NOT DETECTED NOT DETECTED Final   Streptococcus agalactiae NOT DETECTED NOT DETECTED Final   Streptococcus pneumoniae NOT DETECTED NOT DETECTED Final   Streptococcus pyogenes NOT DETECTED NOT DETECTED Final   A.calcoaceticus-baumannii NOT DETECTED NOT DETECTED Final   Bacteroides fragilis NOT DETECTED NOT DETECTED Final   Enterobacterales NOT DETECTED NOT DETECTED Final   Enterobacter cloacae complex NOT DETECTED  NOT DETECTED Final   Escherichia coli NOT DETECTED NOT DETECTED Final    Klebsiella aerogenes NOT DETECTED NOT DETECTED Final   Klebsiella oxytoca NOT DETECTED NOT DETECTED Final   Klebsiella pneumoniae NOT DETECTED NOT DETECTED Final   Proteus species NOT DETECTED NOT DETECTED Final   Salmonella species NOT DETECTED NOT DETECTED Final   Serratia marcescens NOT DETECTED NOT DETECTED Final   Haemophilus influenzae NOT DETECTED NOT DETECTED Final   Neisseria meningitidis NOT DETECTED NOT DETECTED Final   Pseudomonas aeruginosa NOT DETECTED NOT DETECTED Final   Stenotrophomonas maltophilia NOT DETECTED NOT DETECTED Final   Candida albicans NOT DETECTED NOT DETECTED Final   Candida auris NOT DETECTED NOT DETECTED Final   Candida glabrata NOT DETECTED NOT DETECTED Final   Candida krusei NOT DETECTED NOT DETECTED Final   Candida parapsilosis NOT DETECTED NOT DETECTED Final   Candida tropicalis NOT DETECTED NOT DETECTED Final   Cryptococcus neoformans/gattii NOT DETECTED NOT DETECTED Final    Comment: Performed at [facility], [place] [address], [place], [place] [address]         Radiology Studies: No results found.      Scheduled Meds:  heparin injection (subcutaneous)  5,000 Units Subcutaneous Q8H   tamsulosin  0.4 mg Oral Daily   Continuous Infusions:  cefTRIAXone (ROCEPHIN)  IV 200 mL/hr at 03/31/22 0018     LOS: 4 days    Time spent: 45 minutes spent on chart review, discussion with nursing staff, consultants, updating family and interview/physical exam; more than 50% of that time was spent in counseling and/or coordination of care.    Geradine Girt, DO [facility] Available via Epic secure chat 7am-7pm After these hours, please refer to coverage provider listed on [email or web address] 03/31/2022, 12:54 PM

## 2022-03-31 NOTE — Progress Notes (Addendum)
CSW left voicemail for patient's son, Jori Moll, and sent a HIPPA compliant text. Per previous hospital admissions, son is difficult to get in touch with.  Gilmore Laroche, MSW, [facility]

## 2022-03-31 NOTE — NC FL2 (Signed)
[place] LEVEL OF CARE SCREENING TOOL     IDENTIFICATION  Patient Name: Sergio [name] Birthdate: Oct 06, 1932 Sex: male Admission Date (Current Location): 03/27/2022  [facility] and [place] Number:  Herbalist and Address:  [facility], [place] [address], [place], [place] [address]      Provider Number: [id number]  Attending Physician Name and Address:  Geradine Girt, DO  Relative Name and Phone Number:       Current Level of Care: Hospital Recommended Level of Care: [place] Prior Approval Number:    Date Approved/Denied:   PASRR Number: [id number]  Discharge Plan: SNF    Current Diagnoses: Patient Active Problem List   Diagnosis Date Noted   Pressure injury of skin 03/28/2022   Hypernatremia 03/27/2022   Acute cystitis 66/29/4765   Acute metabolic encephalopathy 46/50/3546   Acute on chronic urinary retention 03/27/2022   NSTEMI (non-ST elevated myocardial infarction) ([place]) 03/27/2022   Hypercalcemia 03/27/2022   Chronic diastolic CHF (congestive heart failure) ([place]) 03/27/2022   Syncope and collapse 03/18/2021   Essential hypertension 08/29/2020   Chronic indwelling Foley catheter 08/19/2020   History of pulmonary embolus (PE) 08/19/2020   Severe sepsis ([place]) 08/17/2020   Sepsis due to urinary tract infection ([place]) 08/16/2020   Acute renal failure superimposed on stage 3b chronic kidney disease ([place]) 06/30/2020   Hydronephrosis 06/30/2020   Pain due to onychomycosis of toenails of both feet 10/13/2019   Urinary frequency 10/01/2017   Type 2 diabetes mellitus without complication, without long-term current use of insulin ([place]) 10/01/2017   Loss of weight 03/24/2017   Hypoglycemia 03/24/2017   Iatrogenic hypotension 03/24/2017   Nonspecific abnormal electrocardiogram (ECG) (EKG) 02/02/2017   Colon polyps 07/04/2014   DM2 (diabetes mellitus, type 2) ([place]) 12/18/2011   Uncontrolled hypertension     Other and unspecified hyperlipidemia     Orientation RESPIRATION BLADDER Height & Weight     Self, Place  Normal Incontinent, External catheter Weight: 128 lb 4.9 oz (58.2 kg) Height:  '5\' 5"'$  (165.1 cm)  BEHAVIORAL SYMPTOMS/MOOD NEUROLOGICAL BOWEL NUTRITION STATUS      Continent Diet (See dc summary)  AMBULATORY STATUS COMMUNICATION OF NEEDS Skin   Extensive Assist Verbally PU Stage and Appropriate Care, Other (Comment) (Stage II on hip;Skin tear on chest, arm, elbow, thigh, pretibial; wound on toe)                       Personal Care Assistance Level of Assistance  Bathing, Feeding, Dressing Bathing Assistance: Maximum assistance Feeding assistance: Limited assistance Dressing Assistance: Maximum assistance     Functional Limitations Info  Sight, Hearing Sight Info: Impaired Hearing Info: Impaired      SPECIAL CARE FACTORS FREQUENCY  PT (By licensed PT), OT (By licensed OT)     PT Frequency: 5x/week OT Frequency: 5x/week            Contractures Contractures Info: Not present    Additional Factors Info  Code Status, Allergies Code Status Info: Full Allergies Info: NKA           Current Medications (03/31/2022):  This is the current hospital active medication list Current Facility-Administered Medications  Medication Dose Route Frequency Provider Last Rate Last Admin   acetaminophen (TYLENOL) tablet 650 mg  650 mg Oral Q6H PRN Howerter, Justin B, DO       Or   acetaminophen (TYLENOL) suppository 650 mg  650 mg Rectal Q6H  PRN Howerter, Justin B, DO       cefTRIAXone (ROCEPHIN) 1 g in sodium chloride 0.9 % 100 mL IVPB  1 g Intravenous Q24H Causey, [place], MD 200 mL/hr at 03/31/22 0018 Infusion Verify at 03/31/22 0018   heparin injection 5,000 Units  5,000 Units Subcutaneous Q8H Vann, Jessica U, DO   5,000 Units at 03/31/22 1449   potassium chloride 10 mEq in 100 mL IVPB  10 mEq Intravenous Q1 Hr x 3 Vann, Jessica U, DO 100 mL/hr at 03/31/22 1606 10 mEq at  03/31/22 1606   tamsulosin (FLOMAX) capsule 0.4 mg  0.4 mg Oral Daily Howerter, Justin B, DO   0.4 mg at 03/31/22 7902     Discharge Medications: Please see discharge summary for a list of discharge medications.  Relevant Imaging Results:  Relevant Lab Results:   Additional Information SSN [id number]. [place] COVID-19 Vaccine 12/20/2019 , 11/24/2019  Benard Halsted, LCSW

## 2022-03-31 NOTE — TOC Initial Note (Signed)
Transition of Care [employer]) - Initial/Assessment Note    Patient Details  Name: Sergio [name] MRN: [id number] Date of Birth: 01-06-1933  Transition of Care [facility]) CM/SW Contact:    Benard Halsted, LCSW Phone Number: 03/31/2022, 5:06 PM  Clinical Narrative:                 CSW received return call from patient's son, Ron. He reported that patient stays with him and his brother (Ron reported brother does not have a phone) and they are currently unable to care for patient. CSW updated their new address on the Facesheet ([place]). He expressed understanding of PT recommendation and is agreeable to SNF placement at time of discharge. CSW discussed insurance authorization process and will provide Medicare SNF ratings list. Patient has received COVID vaccines. CSW will send out referrals for review. CSW made Ron aware that MD has also been trying to get in touch with him but he stated he will be available tomorrow and will come to the hospital.   Skilled Nursing Rehab Facilities-   [email or web address]   Ratings out of 5 possible   Name Address  Phone # [place] Inspection Overall  [facility] [address], [place] '4 5 2 3  '$ [facility]  [address], [place] [phone] '3 2 5 5  '$ [facility] [place], [place] '3 1 1 1  '$ [place], [place] '3 2 4 4  '$ [facility] [address], [place] '1 1 2 1  '$ [facility] [place] '2 1 4 3  '$ [facility] [address] [place] '5 2 3 4  '$ [facility] [address] [place] '5 2 2 3  '$ [address] ([place]) [place], [place] [phone] '5 1 2 2  '$ [facility] [address], [place] [phone] '4 1 2 1  '$ [facility] [address], [facility] [phone] '4 1 2 1   '$ [facility]) [facility] [place]. [place], [place] [phone] '4 1 1 1  '$ Dustin Flock [address] [phone] '3 2 4 4          '$ [place] [place] '4 2 3 3  '$ [facility] [place] [address] [place] '4 1 5 4  '$ [facility], [place], [place] [phone] '2 1 1 1  '$ [facility] [address] [phone] '2 1 3 2          '$ [address] (no [facility]) [address], [place] [phone] '4 5 5 5  '$ [facility] (No Humana) [address] [phone] [address], [place] '3 1 4 3  '$ [facility] (No UHC) [place], [place] '5 5 5 5  '$ [facility] [address] [phone] '3 2 4 4  '$ [facility] [place] [address] [place] '1 1 2 1  '$ [facility] [name] [place] '2 1 1 1  '$ [place], [place] '5 2 4 5  '$ [facility] [address] [place] '3 1 1 1  '$ [facility] [place], [place] '2 1 2 1          '$ [facility] [address], [place] 1  $'1 1 1  'z$ [facility] [address] [place]  [phone] '2 4 2 2  '$ [facility] [place] [address], [place] [phone] '5 2 3 4  '$ [place] [address] [place] '2 1 1 1  '$ [place] (No Humana) [address] [place] [phone] '2 1 3 2  '$ [facility] [address] [place] [phone] '3 1 1 1          '$ [facility] [place], [place] '5 4 5 5  '$ [facility])  [address], [place] '2 2 3 3  '$ [facility]) [place] [address], [place] '3 2 4 4  '$ [facility] [place] [name] [place] '4 3 4 4  '$ [address] [place], [place] '3 3 1 1  '$ [facility] [place]) [address] [place] [phone] '2 2 4 4     '$ Expected Discharge Plan: [place] Barriers to Discharge: Continued Medical Work up, Ship broker, SNF Pending bed offer   Patient Goals and CMS Choice Patient states their goals for this hospitalization and ongoing recovery are:: Rehab CMS Medicare.gov Compare Post Acute Care list provided to:: Patient Represenative (must comment) Choice offered to / list presented to : Adult Children  Expected Discharge Plan and Services Expected Discharge Plan: [place] In-house Referral: Clinical Social Work   Post Acute Care Choice: [place] Living arrangements for the past 2 months: [place]                                      Prior Living Arrangements/Services Living arrangements for the past 2 months: Single Family Home Lives with:: Adult Children Patient language and need for interpreter reviewed:: Yes Do you feel safe going back to the place where you live?: Yes      Need for Family Participation in Patient Care: Yes (Comment) Care giver support system in place?: Yes (comment)   Criminal Activity/Legal Involvement Pertinent to Current Situation/Hospitalization: No - Comment as needed  Activities of Daily Living      Permission Sought/Granted Permission sought to share information with : Facility Sport and exercise psychologist, Family Supports    Share Information with NAME: Jori Moll  Permission granted to share info w AGENCY: SNFs  Permission granted to share info w Relationship: Son  Permission granted to share info w Contact Information: [phone]  Emotional Assessment Appearance:: Appears stated age Attitude/Demeanor/Rapport: Unable to Assess Affect (typically observed): Unable to Assess Orientation: : Oriented to Self, Oriented to Place Alcohol / Substance Use: Not  Applicable Psych Involvement: No (comment)  Admission diagnosis:  Metabolic encephalopathy [M63.81] Dehydration [E86.0] Hypernatremia [E87.0] NSTEMI (non-ST elevated myocardial infarction) ([place]) [I21.4] Acute renal failure, unspecified acute renal failure type [facility]) [N17.9] Patient Active Problem List   Diagnosis Date Noted   Pressure injury of skin 03/28/2022   Hypernatremia 03/27/2022   Acute cystitis 77/07/6578   Acute metabolic encephalopathy 03/83/3383   Acute on chronic urinary retention 03/27/2022   NSTEMI (non-ST elevated myocardial infarction) ([place]) 03/27/2022   Hypercalcemia 03/27/2022   Chronic diastolic CHF (congestive heart failure) ([place]) 03/27/2022   Syncope and collapse 03/18/2021   Essential hypertension 08/29/2020   Chronic indwelling Foley catheter 08/19/2020   History of pulmonary embolus (PE) 08/19/2020   Severe sepsis ([place]) 08/17/2020   Sepsis due to  urinary tract infection ([place]) 08/16/2020   Acute renal failure superimposed on stage 3b chronic kidney disease ([place]) 06/30/2020   Hydronephrosis 06/30/2020   Pain due to onychomycosis of toenails of both feet 10/13/2019   Urinary frequency 10/01/2017   Type 2 diabetes mellitus without complication, without long-term current use of insulin ([place]) 10/01/2017   Loss of weight 03/24/2017   Hypoglycemia 03/24/2017   Iatrogenic hypotension 03/24/2017   Nonspecific abnormal electrocardiogram (ECG) (EKG) 02/02/2017   Colon polyps 07/04/2014   DM2 (diabetes mellitus, type 2) ([place]) 12/18/2011   Uncontrolled hypertension    Other and unspecified hyperlipidemia    PCP:  Forrest Moron, MD Pharmacy:   [place], [place] - [address] [place] [address] Phone: [phone] Fax: [phone]     Social Determinants of Health (SDOH) Interventions    Readmission Risk Interventions     No data to display

## 2022-03-31 NOTE — Evaluation (Signed)
Occupational Therapy Evaluation Patient Details Name: Sergio [name] MRN: [id number] DOB: 1933/01/17 Today's Date: 03/31/2022   History of Present Illness Pt is a 88 y/o male presenting on 6/30 with AMS.  Found with acute hypernatremia, elevated troponin due to severe sepsis from UTI and dehydration, AKI. PMHx:  chronic urinary retention, CKD3b, DM, CHF, HTN, PE.   Clinical Impression   Patient admitted for above and presents with problem list below, including impaired cognition, balance, weakness, and activity tolerance. He is only oriented to self (and requires cueing to recall/verbalize birthday), requires increased time with inconsistency following simple commands and poor awareness to deficits.  He is unable to report PLOF or home setup due to cognition and confusion. He requires total assist for bed mobility, setup for eating and min assist for grooming, mod for UB ADLs and total for LB ADLs.  Based on performance today, he will benefit from further OT services while admitted and after dc at SNF level to optimize independence, safety and return to PLOF.  Will follow.      Recommendations for follow up therapy are one component of a multi-disciplinary discharge planning process, led by the attending physician.  Recommendations may be updated based on patient status, additional functional criteria and insurance authorization.   Follow Up Recommendations  Skilled nursing-short term rehab (<3 hours/day)    Assistance Recommended at Discharge Frequent or constant Supervision/Assistance  Patient can return home with the following Two people to help with walking and/or transfers;Two people to help with bathing/dressing/bathroom;Assistance with cooking/housework;Assistance with feeding;Direct supervision/assist for medications management;Direct supervision/assist for financial management;Assist for transportation;Help with stairs or ramp for entrance    Functional Status Assessment  Patient has  had a recent decline in their functional status and demonstrates the ability to make significant improvements in function in a reasonable and predictable amount of time.  Equipment Recommendations  Other (comment) (defer)    Recommendations for Other Services       Precautions / Restrictions Precautions Precautions: Fall Restrictions Weight Bearing Restrictions: No      Mobility Bed Mobility Overal bed mobility: Needs Assistance Bed Mobility: Supine to Sit, Sit to Supine     Supine to sit: Total assist Sit to supine: Total assist   General bed mobility comments: pt with poor initation and sequencing, difficulty following simple commands and overall requires total assist for LB and trunk support to transition to EOB using bed pad.    Transfers Overall transfer level: Needs assistance   Transfers: Sit to/from Stand Sit to Stand: Total assist           General transfer comment: total assist without full stand using face to face technique, blocking feet and gait belt.      Balance Overall balance assessment: Needs assistance Sitting-balance support: No upper extremity supported, Feet supported Sitting balance-Leahy Scale: Fair Sitting balance - Comments: min guard to supervision statically at EOB during ADLs   Standing balance support: Bilateral upper extremity supported, During functional activity Standing balance-Leahy Scale: Zero                             ADL either performed or assessed with clinical judgement   ADL Overall ADL's : Needs assistance/impaired Eating/Feeding: Set up;Sitting Eating/Feeding Details (indicate cue type and reason): once sitting EOB, able to self feed with setup assist Grooming: Minimal assistance;Sitting           Upper Body Dressing : Sitting;Moderate assistance  Lower Body Dressing: +2 for physical assistance;+2 for safety/equipment;Total assistance;Sit to/from Health and safety inspector Details (indicate cue  type and reason): deferred         Functional mobility during ADLs: Total assistance;Cueing for safety;Cueing for sequencing       Vision         Perception     Praxis      Pertinent Vitals/Pain Pain Assessment Pain Assessment: Faces Faces Pain Scale: Hurts little more Pain Location: B LES (R>L) with movement Pain Descriptors / Indicators: Discomfort, Grimacing, Guarding Pain Intervention(s): Limited activity within patient's tolerance, Monitored during session, Repositioned     Hand Dominance Right   Extremity/Trunk Assessment Upper Extremity Assessment Upper Extremity Assessment: Generalized weakness (stiff and limited UB ROM)   Lower Extremity Assessment Lower Extremity Assessment: Defer to PT evaluation       Communication Communication Communication: No difficulties   Cognition Arousal/Alertness: Awake/alert Behavior During Therapy: Flat affect Overall Cognitive Status: Impaired/Different from baseline Area of Impairment: Orientation, Attention, Memory, Following commands, Safety/judgement, Awareness, Problem solving                 Orientation Level: Disoriented to, Place, Time, Situation Current Attention Level: Sustained Memory: Decreased short-term memory, Decreased recall of precautions Following Commands: Follows one step commands inconsistently, Follows one step commands with increased time Safety/Judgement: Decreased awareness of deficits, Decreased awareness of safety Awareness: Intellectual Problem Solving: Slow processing, Decreased initiation, Difficulty sequencing, Requires verbal cues, Requires tactile cues General Comments: Pt reports 1999, able to state birthday after assistance with month.  Pt with decreased initation and sequencing during session, poor awareness and requires cueing.     General Comments       Exercises     Shoulder Instructions      Home Living Family/patient expects to be discharged to:: Skilled nursing  facility                                 Additional Comments: pt reports living with his grandfather and son. poor historian      Prior Functioning/Environment Prior Level of Function : Patient poor historian/Family not available               ADLs Comments: pt unable to provide report        OT Problem List: Decreased strength;Decreased activity tolerance;Impaired balance (sitting and/or standing);Decreased coordination;Decreased cognition;Decreased safety awareness;Decreased knowledge of use of DME or AE;Decreased knowledge of precautions;Pain      OT Treatment/Interventions: Self-care/ADL training;Therapeutic exercise;DME and/or AE instruction;Therapeutic activities;Patient/family education;Balance training;Cognitive remediation/compensation    OT Goals(Current goals can be found in the care plan section) Acute Rehab OT Goals Patient Stated Goal: to lay back down OT Goal Formulation: With patient Time For Goal Achievement: 04/14/22 Potential to Achieve Goals: Fair  OT Frequency: Min 2X/week    Co-evaluation              AM-PAC OT "6 Clicks" Daily Activity     Outcome Measure Help from another person eating meals?: A Little Help from another person taking care of personal grooming?: A Little Help from another person toileting, which includes using toliet, bedpan, or urinal?: Total Help from another person bathing (including washing, rinsing, drying)?: A Lot Help from another person to put on and taking off regular upper body clothing?: A Lot Help from another person to put on and taking off regular lower body clothing?: Total 6  Click Score: 12   End of Session Equipment Utilized During Treatment: Gait belt Nurse Communication: Mobility status  Activity Tolerance: Patient tolerated treatment well Patient left: in bed;with call bell/phone within reach;with bed alarm set  OT Visit Diagnosis: Other abnormalities of gait and mobility (R26.89);Muscle  weakness (generalized) (M62.81);Pain;Other symptoms and signs involving cognitive function Pain - Right/Left: Right Pain - part of body: Leg                Time: 0623-7628 OT Time Calculation (min): 26 min Charges:  OT General Charges $OT Visit: 1 Visit OT Evaluation $OT Eval Moderate Complexity: 1 Mod OT Treatments $Self Care/Home Management : 8-22 mins  Jolaine Artist, OT Acute Rehabilitation Services Office [phone]   Delight Stare 03/31/2022, 11:28 AM

## 2022-04-01 DIAGNOSIS — G9341 Metabolic encephalopathy: Secondary | ICD-10-CM | POA: Diagnosis not present

## 2022-04-01 DIAGNOSIS — N390 Urinary tract infection, site not specified: Secondary | ICD-10-CM

## 2022-04-01 DIAGNOSIS — E870 Hyperosmolality and hypernatremia: Secondary | ICD-10-CM | POA: Diagnosis not present

## 2022-04-01 DIAGNOSIS — E1122 Type 2 diabetes mellitus with diabetic chronic kidney disease: Secondary | ICD-10-CM | POA: Diagnosis not present

## 2022-04-01 DIAGNOSIS — R7881 Bacteremia: Secondary | ICD-10-CM | POA: Diagnosis not present

## 2022-04-01 DIAGNOSIS — N179 Acute kidney failure, unspecified: Secondary | ICD-10-CM | POA: Diagnosis not present

## 2022-04-01 DIAGNOSIS — B9689 Other specified bacterial agents as the cause of diseases classified elsewhere: Secondary | ICD-10-CM

## 2022-04-01 LAB — CBC
HCT: 30 % — ABNORMAL LOW (ref 39.0–52.0)
Hemoglobin: 9.7 g/dL — ABNORMAL LOW (ref 13.0–17.0)
MCH: 28.1 pg (ref 26.0–34.0)
MCHC: 32.3 g/dL (ref 30.0–36.0)
MCV: 87 fL (ref 80.0–100.0)
Platelets: 137 10*3/uL — ABNORMAL LOW (ref 150–400)
RBC: 3.45 MIL/uL — ABNORMAL LOW (ref 4.22–5.81)
RDW: 14.6 % (ref 11.5–15.5)
WBC: 11.1 10*3/uL — ABNORMAL HIGH (ref 4.0–10.5)
nRBC: 0 % (ref 0.0–0.2)

## 2022-04-01 LAB — BASIC METABOLIC PANEL
Anion gap: 4 — ABNORMAL LOW (ref 5–15)
BUN: 21 mg/dL (ref 8–23)
CO2: 23 mmol/L (ref 22–32)
Calcium: 7.9 mg/dL — ABNORMAL LOW (ref 8.9–10.3)
Chloride: 119 mmol/L — ABNORMAL HIGH (ref 98–111)
Creatinine, Ser: 1.51 mg/dL — ABNORMAL HIGH (ref 0.61–1.24)
GFR, Estimated: 44 mL/min — ABNORMAL LOW (ref >60)
Glucose, Bld: 81 mg/dL (ref 70–99)
Potassium: 3.7 mmol/L (ref 3.5–5.1)
Sodium: 146 mmol/L — ABNORMAL HIGH (ref 135–145)

## 2022-04-01 LAB — CULTURE, BLOOD (ROUTINE X 2)
Culture: NO GROWTH
Special Requests: ADEQUATE
Special Requests: ADEQUATE

## 2022-04-01 LAB — GLUCOSE, CAPILLARY
Glucose-Capillary: 96 mg/dL (ref 70–99)
Glucose-Capillary: 155 mg/dL — ABNORMAL HIGH (ref 70–99)
Glucose-Capillary: 155 mg/dL — ABNORMAL HIGH (ref 70–99)
Glucose-Capillary: 189 mg/dL — ABNORMAL HIGH (ref 70–99)

## 2022-04-01 MED ORDER — SODIUM CHLORIDE 0.9 % IV SOLN
2.0000 g | INTRAVENOUS | Status: DC
  Administered 2022-04-01 – 2022-04-05 (×5): 2 g via INTRAVENOUS
  Filled 2022-04-01 (×5): qty 20

## 2022-04-01 MED ORDER — METOPROLOL TARTRATE 12.5 MG HALF TABLET
12.5000 mg | ORAL_TABLET | Freq: Two times a day (BID) | ORAL | Status: DC
  Administered 2022-04-01 – 2022-04-02 (×3): 12.5 mg via ORAL
  Filled 2022-04-01 (×3): qty 1

## 2022-04-01 MED ORDER — ASPIRIN 81 MG PO CHEW
81.0000 mg | CHEWABLE_TABLET | Freq: Every day | ORAL | Status: DC
  Administered 2022-04-01 – 2022-04-10 (×10): 81 mg via ORAL
  Filled 2022-04-01 (×10): qty 1

## 2022-04-01 NOTE — Plan of Care (Signed)
  Problem: Education: Goal: Ability to describe self-care measures that may prevent or decrease complications (Diabetes Survival Skills Education) will improve Outcome: Not Progressing Goal: Individualized Educational Video(s) Outcome: Not Progressing   Problem: Coping: Goal: Ability to adjust to condition or change in health will improve Outcome: Progressing   Problem: Fluid Volume: Goal: Ability to maintain a balanced intake and output will improve Outcome: Progressing   Problem: Health Behavior/Discharge Planning: Goal: Ability to identify and utilize available resources and services will improve Outcome: Progressing Goal: Ability to manage health-related needs will improve Outcome: Not Progressing   Problem: Metabolic: Goal: Ability to maintain appropriate glucose levels will improve Outcome: Progressing   Problem: Nutritional: Goal: Maintenance of adequate nutrition will improve Outcome: Progressing Goal: Progress toward achieving an optimal weight will improve Outcome: Progressing   Problem: Skin Integrity: Goal: Risk for impaired skin integrity will decrease Outcome: Progressing   Problem: Tissue Perfusion: Goal: Adequacy of tissue perfusion will improve Outcome: Progressing   Problem: Education: Goal: Knowledge of General Education information will improve Description: Including pain rating scale, medication(s)/side effects and non-pharmacologic comfort measures Outcome: Not Progressing   Problem: Health Behavior/Discharge Planning: Goal: Ability to manage health-related needs will improve Outcome: Progressing   Problem: Clinical Measurements: Goal: Ability to maintain clinical measurements within normal limits will improve Outcome: Progressing Goal: Will remain free from infection Outcome: Progressing Goal: Diagnostic test results will improve Outcome: Progressing Goal: Respiratory complications will improve Outcome: Progressing Goal: Cardiovascular  complication will be avoided Outcome: Progressing   Problem: Activity: Goal: Risk for activity intolerance will decrease Outcome: Progressing   Problem: Nutrition: Goal: Adequate nutrition will be maintained Outcome: Progressing   Problem: Coping: Goal: Level of anxiety will decrease Outcome: Progressing   Problem: Elimination: Goal: Will not experience complications related to bowel motility Outcome: Progressing Goal: Will not experience complications related to urinary retention Outcome: Progressing   Problem: Pain Managment: Goal: General experience of comfort will improve Outcome: Progressing   Problem: Safety: Goal: Ability to remain free from injury will improve Outcome: Progressing   Problem: Skin Integrity: Goal: Risk for impaired skin integrity will decrease Outcome: Progressing   Problem: Fluid Volume: Goal: Hemodynamic stability will improve Outcome: Progressing   Problem: Clinical Measurements: Goal: Diagnostic test results will improve Outcome: Progressing Goal: Signs and symptoms of infection will decrease Outcome: Progressing

## 2022-04-01 NOTE — Progress Notes (Signed)
PROGRESS NOTE        PATIENT DETAILS Name: Sergio [name] Age: 88 y.o. Sex: male Date of Birth: 09-05-1933 Admit Date: 03/27/2022 Admitting Physician Rhetta Mura, DO YDX:AJOINOMVE, Arlie Solomons, MD  Brief Summary: Patient is a 88 y.o.  male with history of DM-2, HTN, CKD stage IIIb, chronic HFpEF, suspected dementia/cognitive dysfunction at baseline-presented with confusion-found to have severe hyponatremia.  Significant events: 6/30>> AMS-hypernatremia-admit to [facility]  Significant studies: 6/30>> TSH: 4.8 (minimally elevated)-FT4: Normal 6/30>> NH 4: 53 6/30>> CXR: No PNA 6/30>> CT head: No acute findings 6/30>> CT abdomen/pelvis: No hydronephrosis-cholelithiasis without inflammatory change-enlarged prostate. 7/01>> Echo: EF 65-70%, no regional wall motion abnormality. 7/04>> acute hepatitis serology: Negative  Significant microbiology data: 6/30>> blood culture: Aerococcus urinae in 1/2  6/30>> urine culture: 60,000 colonies of Proteus/100,000 colonies of Aerococcus 6/30>> COVID/influenza PCR: Negative   Procedures:   Consults: None  Subjective: Lying comfortably in bed-denies any chest pain or shortness of breath.  Appears to be somewhat confused but following simple commands-no family at bedside.  Objective: Vitals: Blood pressure (!) 151/82, pulse 80, temperature 99.3 F (37.4 C), temperature source Axillary, resp. rate 20, height '5\' 5"'$  (1.651 m), weight 58.2 kg, SpO2 100 %.   Exam: Gen Exam:Alert awake-not in any distress HEENT:atraumatic, normocephalic Chest: B/L clear to auscultation anteriorly CVS:S1S2 regular Abdomen:soft non tender, non distended Extremities:no edema Neurology: Non focal Skin: no rash  Pertinent Labs/Radiology:    Latest Ref Rng & Units 04/01/2022    1:04 AM 03/31/2022    1:25 AM 03/30/2022   12:22 AM  CBC  WBC 4.0 - 10.5 K/uL 11.1  11.6  15.0   Hemoglobin 13.0 - 17.0 g/dL 9.7  9.1  10.2   Hematocrit 39.0 -  52.0 % 30.0  28.1  32.1   Platelets 150 - 400 K/uL 137  115  109     Lab Results  Component Value Date   NA 146 (H) 04/01/2022   K 3.7 04/01/2022   CL 119 (H) 04/01/2022   CO2 23 04/01/2022      Assessment/Plan: Acute metabolic encephalopathy: Due to a combination of AKI/hypernatremia-suspect may have some amount of cognitive dysfunction at baseline.  No family at bedside.  Hypernatremia: Improved-due to dehydration/poor oral intake.  Continue to encourage oral intake-follow electrolytes.  AKI on CKD stage IIIb: Hemodynamically mediated-improving with supportive care.  Non-STEMI: Given advanced dementia/AKI-managed medically-no wall motion abnormality on echo-EF preserved-we will start aspirin/beta-blocker for now-once CK downtrends further-we can contemplate starting statin.  Check fasting lipids.  Severe sepsis due to Aerococcus bacteremia/UTI: Febrile to 101 on initial presentation-sepsis physiology has resolved-remains on Rocephin-I have consulted infectious disease.  Proteus UTI: On Rocephin-unclear whether this is a contamination-as Aerococcus also growing in blood/urine.  In any event-already on Rocephin (sensitive)  Transaminitis: Probably multifactorial in the setting of myolysis/sepsis-repeat liver enzymes periodically.  Acute hepatitis serology negative.  Rhabdomyolysis: Due to likely being down-CK levels downtrending-follow periodically.  Normocytic anemia: Due to acute illness-no evidence of blood loss  HTN: BP is slightly on the higher side-starting low-dose metoprolol.  Follow/optimize.  DM-2 (A1c 6.3 on 6/30): CBG stable with SSI  Recent Labs    03/31/22 1549 03/31/22 2117 04/01/22 0700  GLUCAP 118* 200* 96    BPH: Per nursing staff-never required Foley catheter insertion this admission-on Flomax-monitor with periodic bladder scans.  ?  Chronic cognitive dysfunction: Unclear baseline-check RPR/B12 levels with a.m. labs.  Pressure Ulcer: Pressure Injury  03/28/22 Hip Left;Lateral Stage 2 -  Partial thickness loss of dermis presenting as a shallow open injury with a red, pink wound bed without slough. 2 L hip wounds (Active)  03/28/22 1400  Location: Hip  Location Orientation: Left;Lateral  Staging: Stage 2 -  Partial thickness loss of dermis presenting as a shallow open injury with a red, pink wound bed without slough.  Wound Description (Comments): 2 L hip wounds  Present on Admission: Yes  Dressing Type Foam - Lift dressing to assess site every shift 04/01/22 0819    BMI Estimated body mass index is 21.35 kg/m as calculated from the following:   Height as of this encounter: '5\' 5"'$  (1.651 m).   Weight as of this encounter: 58.2 kg.   Code status:   Code Status: Full Code   DVT Prophylaxis: heparin injection 5,000 Units Start: 03/31/22 1400 SCDs Start: 03/27/22 2044    Family Communication: None at bedside   Disposition Plan: Status is: Inpatient Remains inpatient appropriate because: Bacteremia/hyponatremia-resolving encephalopathy-noted stable for discharge.   Planned Discharge Destination:Skilled nursing facility   Diet: Diet Order             Diet regular Room service appropriate? No; Fluid consistency: Thin  Diet effective now                     Antimicrobial agents: Anti-infectives (From admission, onward)    Start     Dose/Rate Route Frequency Ordered Stop   03/27/22 1715  cefTRIAXone (ROCEPHIN) 1 g in sodium chloride 0.9 % 100 mL IVPB        1 g 200 mL/hr over 30 Minutes Intravenous Every 24 hours 03/27/22 1715 04/03/22 1759   03/27/22 1715  vancomycin (VANCOREADY) IVPB 1250 mg/250 mL        1,250 mg 166.7 mL/hr over 90 Minutes Intravenous  Once 03/27/22 1715 03/27/22 2048        MEDICATIONS: Scheduled Meds:  heparin injection (subcutaneous)  5,000 Units Subcutaneous Q8H   insulin aspart  0-9 Units Subcutaneous TID AC & HS   tamsulosin  0.4 mg Oral Daily   Continuous Infusions:   cefTRIAXone (ROCEPHIN)  IV 1 g (03/31/22 1901)   PRN Meds:.acetaminophen **OR** acetaminophen   I have personally reviewed following labs and imaging studies  LABORATORY DATA: CBC: Recent Labs  Lab 03/27/22 1737 03/27/22 1810 03/28/22 0515 03/29/22 0212 03/30/22 0022 03/31/22 0125 04/01/22 0104  WBC 20.1*  --  20.4* 17.2* 15.0* 11.6* 11.1*  NEUTROABS 18.3*  --  17.5*  --   --   --   --   HGB 13.0   < > 12.0* 11.0* 10.2* 9.1* 9.7*  HCT 42.4   < > 38.2* 35.6* 32.1* 28.1* 30.0*  MCV 89.6  --  89.0 89.0 88.9 86.5 87.0  PLT 235  --  173 124* 109* 115* 137*   < > = values in this interval not displayed.    Basic Metabolic Panel: Recent Labs  Lab 03/27/22 2002 03/28/22 0515 03/28/22 0912 03/28/22 1603 03/29/22 0212 03/30/22 0022 03/31/22 0125 04/01/22 0104  NA  --  158*   < > 155* 153* 149* 144 146*  K  --  3.7   < > 3.5 3.8 4.2 3.3* 3.7  CL  --  125*   < > 122* 118* 115* 114* 119*  CO2  --  25   < >  $'23 24 24 24 23  't$ GLUCOSE  --  184*   < > 204* 131* 179* 163* 81  BUN  --  66*   < > 58* 50* 41* 27* 21  CREATININE  --  3.06*   < > 2.60* 2.28* 2.10* 1.60* 1.51*  CALCIUM  --  8.7*   < > 8.5* 8.2* 8.2* 7.7* 7.9*  MG 2.6* 2.6*  --   --   --   --   --   --   PHOS  --  3.3  --   --   --   --   --   --    < > = values in this interval not displayed.    GFR: Estimated Creatinine Clearance: 28.4 mL/min (A) (by C-G formula based on SCr of 1.51 mg/dL (H)).  Liver Function Tests: Recent Labs  Lab 03/27/22 1737 03/28/22 0515 03/29/22 0212 03/30/22 0022 03/31/22 0125  AST 78* 136* 172* 449* 189*  ALT 40 77* 136* 474* 334*  ALKPHOS 46 47 43 56 49  BILITOT 1.1 0.9 1.0 0.5 0.4  PROT 7.5 6.6 6.1* 6.0* 5.5*  ALBUMIN 2.3* 2.0* 1.8* 1.7* 1.6*   No results for input(s): "LIPASE", "AMYLASE" in the last 168 hours. Recent Labs  Lab 03/27/22 1804  AMMONIA 53*    Coagulation Profile: Recent Labs  Lab 03/27/22 1737  INR 1.4*    Cardiac Enzymes: Recent Labs  Lab  03/27/22 1737 03/28/22 0515 03/29/22 0212  CKTOTAL 1,858* 1,844* 756*    BNP (last 3 results) No results for input(s): "PROBNP" in the last 8760 hours.  Lipid Profile: No results for input(s): "CHOL", "HDL", "LDLCALC", "TRIG", "CHOLHDL", "LDLDIRECT" in the last 72 hours.  Thyroid Function Tests: No results for input(s): "TSH", "T4TOTAL", "FREET4", "T3FREE", "THYROIDAB" in the last 72 hours.  Anemia Panel: No results for input(s): "VITAMINB12", "FOLATE", "FERRITIN", "TIBC", "IRON", "RETICCTPCT" in the last 72 hours.  Urine analysis:    Component Value Date/Time   COLORURINE AMBER (A) 03/27/2022 2030   APPEARANCEUR CLOUDY (A) 03/27/2022 2030   LABSPEC 1.013 03/27/2022 2030   PHURINE 8.0 03/27/2022 2030   GLUCOSEU NEGATIVE 03/27/2022 2030   HGBUR MODERATE (A) 03/27/2022 2030   BILIRUBINUR NEGATIVE 03/27/2022 2030   BILIRUBINUR negative 10/01/2017 0901   BILIRUBINUR neg 04/02/2014 1605   KETONESUR NEGATIVE 03/27/2022 2030   PROTEINUR 100 (A) 03/27/2022 2030   UROBILINOGEN 0.2 10/01/2017 0901   UROBILINOGEN 0.2 02/12/2015 2205   NITRITE NEGATIVE 03/27/2022 2030   LEUKOCYTESUR SMALL (A) 03/27/2022 2030    Sepsis Labs: Lactic Acid, Venous    Component Value Date/Time   LATICACIDVEN 1.9 03/28/2022 0607    MICROBIOLOGY: Recent Results (from the past 240 hour(s))  Resp Panel by RT-PCR (Flu A&B, Covid) Anterior Nasal Swab     Status: None   Collection Time: 03/27/22  5:13 PM   Specimen: Anterior Nasal Swab  Result Value Ref Range Status   SARS Coronavirus 2 by RT PCR NEGATIVE NEGATIVE Final    Comment: (NOTE) SARS-CoV-2 target nucleic acids are NOT DETECTED.  The SARS-CoV-2 RNA is generally detectable in upper respiratory specimens during the acute phase of infection. The lowest concentration of SARS-CoV-2 viral copies this assay can detect is 138 copies/mL. A negative result does not preclude SARS-Cov-2 infection and should not be used as the sole basis for treatment  or other patient management decisions. A negative result may occur with  improper specimen collection/handling, submission of specimen other than nasopharyngeal swab, presence of  viral mutation(s) within the areas targeted by this assay, and inadequate number of viral copies(<138 copies/mL). A negative result must be combined with clinical observations, patient history, and epidemiological information. The expected result is Negative.  Fact Sheet for Patients:  EntrepreneurPulse.com.au  Fact Sheet for Healthcare Providers:  IncredibleEmployment.be  This test is no t yet approved or cleared by the [place] FDA and  has been authorized for detection and/or diagnosis of SARS-CoV-2 by FDA under an Emergency Use Authorization (EUA). This EUA will remain  in effect (meaning this test can be used) for the duration of the COVID-19 declaration under Section 564(b)(1) of the Act, 21 U.S.C.section 360bbb-3(b)(1), unless the authorization is terminated  or revoked sooner.       Influenza A by PCR NEGATIVE NEGATIVE Final   Influenza B by PCR NEGATIVE NEGATIVE Final    Comment: (NOTE) The Xpert Xpress SARS-CoV-2/FLU/RSV plus assay is intended as an aid in the diagnosis of influenza from Nasopharyngeal swab specimens and should not be used as a sole basis for treatment. Nasal washings and aspirates are unacceptable for Xpert Xpress SARS-CoV-2/FLU/RSV testing.  Fact Sheet for Patients: EntrepreneurPulse.com.au  Fact Sheet for Healthcare Providers: IncredibleEmployment.be  This test is not yet approved or cleared by the [place] FDA and has been authorized for detection and/or diagnosis of SARS-CoV-2 by FDA under an Emergency Use Authorization (EUA). This EUA will remain in effect (meaning this test can be used) for the duration of the COVID-19 declaration under Section 564(b)(1) of the Act, 21 U.S.C. section  360bbb-3(b)(1), unless the authorization is terminated or revoked.  Performed at [facility], [place] [address] [place], [place] [address]   Urine Culture     Status: Abnormal   Collection Time: 03/27/22  5:13 PM   Specimen: In/Out Cath Urine  Result Value Ref Range Status   Specimen Description IN/OUT CATH URINE  Final   Special Requests NONE  Final   Culture (A)  Final    60,000 COLONIES/mL PROTEUS MIRABILIS >=100,000 COLONIES/mL AEROCOCCUS SPECIES Standardized susceptibility testing for this organism is not available. Performed at [facility], [place] [address] [place], [place] [address]    Report Status 03/30/2022 FINAL  Final   Organism ID, Bacteria PROTEUS MIRABILIS (A)  Final      Susceptibility   Proteus mirabilis - MIC*    AMPICILLIN <=2 SENSITIVE Sensitive     CEFAZOLIN <=4 SENSITIVE Sensitive     CEFEPIME <=0.12 SENSITIVE Sensitive     CEFTRIAXONE <=0.25 SENSITIVE Sensitive     CIPROFLOXACIN <=0.25 SENSITIVE Sensitive     GENTAMICIN <=1 SENSITIVE Sensitive     IMIPENEM 2 SENSITIVE Sensitive     NITROFURANTOIN 128 RESISTANT Resistant     TRIMETH/SULFA <=20 SENSITIVE Sensitive     AMPICILLIN/SULBACTAM <=2 SENSITIVE Sensitive     PIP/TAZO <=4 SENSITIVE Sensitive     * 60,000 COLONIES/mL PROTEUS MIRABILIS  Blood Culture (routine x 2)     Status: None (Preliminary result)   Collection Time: 03/27/22  5:37 PM   Specimen: BLOOD  Result Value Ref Range Status   Specimen Description BLOOD RIGHT ANTECUBITAL  Final   Special Requests Blood Culture adequate volume  Final   Culture   Final    NO GROWTH 4 DAYS Performed at [facility], [place] [address] [place],  [address]    Report Status PENDING  Incomplete  Blood Culture (routine x 2)     Status: Abnormal   Collection Time:  03/27/22  6:03 PM   Specimen: BLOOD  Result Value Ref Range Status   Specimen Description BLOOD SITE NOT SPECIFIED  Final   Special Requests   Final    BOTTLES DRAWN AEROBIC  AND ANAEROBIC Blood Culture adequate volume   Culture  Setup Time   Final    GRAM POSITIVE COCCI IN CLUSTERS IN BOTH AEROBIC AND ANAEROBIC BOTTLES CRITICAL RESULT CALLED TO, READ BACK BY AND VERIFIED WITH: PHARMD G.BARR AT 4010 ON 03/29/2022 BY T.SAAD.    Culture (A)  Final    AEROCOCCUS URINAE Standardized susceptibility testing for this organism is not available. Performed at [facility], [place] [address] [place], [place] [address]    Report Status 04/01/2022 FINAL  Final  Blood Culture ID Panel (Reflexed)     Status: None   Collection Time: 03/27/22  6:03 PM  Result Value Ref Range Status   Enterococcus faecalis NOT DETECTED NOT DETECTED Final   Enterococcus Faecium NOT DETECTED NOT DETECTED Final   Listeria monocytogenes NOT DETECTED NOT DETECTED Final   Staphylococcus species NOT DETECTED NOT DETECTED Final   Staphylococcus aureus (BCID) NOT DETECTED NOT DETECTED Final   Staphylococcus epidermidis NOT DETECTED NOT DETECTED Final   Staphylococcus lugdunensis NOT DETECTED NOT DETECTED Final   Streptococcus species NOT DETECTED NOT DETECTED Final   Streptococcus agalactiae NOT DETECTED NOT DETECTED Final   Streptococcus pneumoniae NOT DETECTED NOT DETECTED Final   Streptococcus pyogenes NOT DETECTED NOT DETECTED Final   A.calcoaceticus-baumannii NOT DETECTED NOT DETECTED Final   Bacteroides fragilis NOT DETECTED NOT DETECTED Final   Enterobacterales NOT DETECTED NOT DETECTED Final   Enterobacter cloacae complex NOT DETECTED NOT DETECTED Final   Escherichia coli NOT DETECTED NOT DETECTED Final   Klebsiella aerogenes NOT DETECTED NOT DETECTED Final   Klebsiella oxytoca NOT DETECTED NOT DETECTED Final   Klebsiella pneumoniae NOT DETECTED NOT DETECTED Final   Proteus species NOT DETECTED NOT DETECTED Final   Salmonella species NOT DETECTED NOT DETECTED Final   Serratia marcescens NOT DETECTED NOT DETECTED Final   Haemophilus influenzae NOT DETECTED NOT DETECTED Final    Neisseria meningitidis NOT DETECTED NOT DETECTED Final   Pseudomonas aeruginosa NOT DETECTED NOT DETECTED Final   Stenotrophomonas maltophilia NOT DETECTED NOT DETECTED Final   Candida albicans NOT DETECTED NOT DETECTED Final   Candida auris NOT DETECTED NOT DETECTED Final   Candida glabrata NOT DETECTED NOT DETECTED Final   Candida krusei NOT DETECTED NOT DETECTED Final   Candida parapsilosis NOT DETECTED NOT DETECTED Final   Candida tropicalis NOT DETECTED NOT DETECTED Final   Cryptococcus neoformans/gattii NOT DETECTED NOT DETECTED Final    Comment: Performed at [facility], [address] [place] [address] [place], [place] [address]    RADIOLOGY STUDIES/RESULTS: No results found.   LOS: 5 days   Oren Binet, MD  [facility]    To contact the attending provider between 7A-7P or the covering provider during after hours 7P-7A, please log into the web site [email or web address] and access using universal [place] password for that web site. If you do not have the password, please call the hospital operator.  04/01/2022, 11:37 AM

## 2022-04-01 NOTE — Care Management Important Message (Signed)
Important Message  Patient Details  Name: JERREMY MAIONE MRN: [id number] Date of Birth: November 28, 1932   Medicare Important Message Given:  Yes     Shelda Altes 04/01/2022, 1:56 PM

## 2022-04-02 DIAGNOSIS — E1122 Type 2 diabetes mellitus with diabetic chronic kidney disease: Secondary | ICD-10-CM | POA: Diagnosis not present

## 2022-04-02 DIAGNOSIS — G9341 Metabolic encephalopathy: Secondary | ICD-10-CM | POA: Diagnosis not present

## 2022-04-02 DIAGNOSIS — N179 Acute kidney failure, unspecified: Secondary | ICD-10-CM | POA: Diagnosis not present

## 2022-04-02 DIAGNOSIS — E870 Hyperosmolality and hypernatremia: Secondary | ICD-10-CM | POA: Diagnosis not present

## 2022-04-02 LAB — GLUCOSE, CAPILLARY
Glucose-Capillary: 132 mg/dL — ABNORMAL HIGH (ref 70–99)
Glucose-Capillary: 100 mg/dL — ABNORMAL HIGH (ref 70–99)
Glucose-Capillary: 162 mg/dL — ABNORMAL HIGH (ref 70–99)
Glucose-Capillary: 168 mg/dL — ABNORMAL HIGH (ref 70–99)
Glucose-Capillary: 171 mg/dL — ABNORMAL HIGH (ref 70–99)
Glucose-Capillary: 162 mg/dL — ABNORMAL HIGH (ref 70–99)

## 2022-04-02 LAB — COMPREHENSIVE METABOLIC PANEL
ALT: 158 U/L — ABNORMAL HIGH (ref 0–44)
AST: 56 U/L — ABNORMAL HIGH (ref 15–41)
Albumin: 1.7 g/dL — ABNORMAL LOW (ref 3.5–5.0)
Alkaline Phosphatase: 51 U/L (ref 38–126)
Anion gap: 12 (ref 5–15)
BUN: 18 mg/dL (ref 8–23)
CO2: 23 mmol/L (ref 22–32)
Calcium: 7.9 mg/dL — ABNORMAL LOW (ref 8.9–10.3)
Chloride: 111 mmol/L (ref 98–111)
Creatinine, Ser: 1.55 mg/dL — ABNORMAL HIGH (ref 0.61–1.24)
GFR, Estimated: 43 mL/min — ABNORMAL LOW (ref >60)
Glucose, Bld: 120 mg/dL — ABNORMAL HIGH (ref 70–99)
Potassium: 3.8 mmol/L (ref 3.5–5.1)
Sodium: 146 mmol/L — ABNORMAL HIGH (ref 135–145)
Total Bilirubin: 0.2 mg/dL — ABNORMAL LOW (ref 0.3–1.2)
Total Protein: 5.9 g/dL — ABNORMAL LOW (ref 6.5–8.1)

## 2022-04-02 LAB — T.PALLIDUM AB, TOTAL: T Pallidum Abs: REACTIVE — AB

## 2022-04-02 LAB — HIV ANTIBODY (ROUTINE TESTING W REFLEX): HIV Screen 4th Generation wRfx: NONREACTIVE

## 2022-04-02 LAB — VITAMIN B12: Vitamin B-12: 662 pg/mL (ref 180–914)

## 2022-04-02 LAB — RPR
RPR Ser Ql: REACTIVE — AB
RPR Titer: 1:2 {titer}

## 2022-04-02 LAB — CK: Total CK: 111 U/L (ref 49–397)

## 2022-04-02 MED ORDER — METOPROLOL TARTRATE 25 MG PO TABS
25.0000 mg | ORAL_TABLET | Freq: Two times a day (BID) | ORAL | Status: DC
  Administered 2022-04-02 – 2022-04-10 (×14): 25 mg via ORAL
  Filled 2022-04-02 (×16): qty 1

## 2022-04-02 NOTE — Consult Note (Signed)
[place] for Infectious Disease    Date of Admission:  03/27/2022   Total days of inpatient antibiotics 6        Reason for Consult: Aerococcus urinae bacteremia    Principal Problem:   Hypernatremia Active Problems:   DM2 (diabetes mellitus, type 2) (HCC)   Acute renal failure superimposed on stage 3b chronic kidney disease (HCC)   Severe sepsis (HCC)   Acute cystitis   Acute metabolic encephalopathy   Acute on chronic urinary retention   NSTEMI (non-ST elevated myocardial infarction) (HCC)   Hypercalcemia   Chronic diastolic CHF (congestive heart failure) (HCC)   Pressure injury of skin   Assessment: 88 year old male presented with confusion from home  admitted for hypernatremia.  Found to have Aerococcus urinae UTI and bacteremia. #Aerococcus urinae bacteremia 2/2 Aerococcus UTI -TTE showed no vegetation. TEE not needed as we have a source/endocarditis is not likely at this point. - Patient started on on ceftriaxone 1 g/day -Plan on 2 weeks of ceftriaxone from 2gm/day dosing Recommendations: -Increase ceftriaxone 2gm /day x 2 weeks for bacteremia -Follow repeat Cx to ensure clearance - EOT 7/18   #RPR 1:2 #Concern for dementia vs acute encephalopathy #Admitted for hypernatremia-resolved -Follow-up T pallidum/HIV -Of note during 06/2020 hospitalization at [facility] there was concern for dementia vs uremia.   OPAT ORDERS:  Diagnosis: Aerococcus urinae bacteremia 2/2 Aerococcus UTI  No Known Allergies   Discharge antibiotics to be given via PICC line:  Per pharmacy protocol Ceftriaxone 2gm /day    Duration: 2 weeks End Date: 7/18  [facility] Care Per Protocol with [employer] Use: Home health RN for IV administration and teaching, line care and labs.    Labs weekly while on IV antibiotics: __ CBC with differential __ CMP __ CRP    __ Please pull PIC at completion of IV antibiotics  Fax weekly labs to [phone]  Clinic Follow Up  Appt: 7/21  @ [facility] with Dr. Candiss Norse    Microbiology:   Antibiotics: 6/30 ceftrixone -p Vancomcyin 6/30  Cultures: Blood 6/301/2 Aerococcus Urine 6/30 Aerococcus   HPI: Sergio [name] is a 88 y.o. male type 2 diabetes, hypertension, BPH, CKD stage III, heart failure admitted to [facility] for acute hypernatremia after presenting home for altered mental status.  Per documentation family noted that patient was more confused less interactive over the past week prior to admission.  Found to have Aerococcus urinae UTI.  Blood cultures also resulted as Aerococcus.  ID was engaged.  Patient is unable to provide history.   Review of Systems: ROS  Past Medical History:  Diagnosis Date   Colon polyps 06/22/2014   4 colon polyps by colonoscopy.  Repeat 3 years.  Hung.   Diabetes mellitus    Hypertension    Other and unspecified hyperlipidemia    PE (pulmonary embolism)     Social History   Tobacco Use   Smoking status: Former    Types: Cigars    Quit date: 09/29/1975    Years since quitting: 46.5   Smokeless tobacco: Never  Substance Use Topics   Alcohol use: No   Drug use: No    Family History  Problem Relation Age of Onset   Diabetes Other    Scheduled Meds:  aspirin  81 mg Oral Daily   heparin injection (subcutaneous)  5,000 Units Subcutaneous Q8H   insulin aspart  0-9 Units Subcutaneous TID AC & HS   metoprolol tartrate  12.5 mg Oral BID   tamsulosin  0.4 mg Oral Daily   Continuous Infusions:  cefTRIAXone (ROCEPHIN)  IV Stopped (04/01/22 1833)   PRN Meds:.acetaminophen **OR** acetaminophen No Known Allergies  OBJECTIVE: Blood pressure (!) 150/70, pulse 64, temperature 98.7 F (37.1 C), temperature source Axillary, resp. rate 19, height '5\' 5"'$  (1.651 m), weight 58.2 kg, SpO2 97 %.  Physical Exam Constitutional:      General: He is not in acute distress.    Appearance: He is not toxic-appearing.     Comments: Aox1, oriented to name  HENT:     Head:  Normocephalic and atraumatic.     Right Ear: External ear normal.     Left Ear: External ear normal.     Nose: No congestion or rhinorrhea.     Mouth/Throat:     Mouth: Mucous membranes are moist.     Pharynx: Oropharynx is clear.  Eyes:     Extraocular Movements: Extraocular movements intact.     Conjunctiva/sclera: Conjunctivae normal.     Pupils: Pupils are equal, round, and reactive to light.  Cardiovascular:     Rate and Rhythm: Normal rate and regular rhythm.     Heart sounds: No murmur heard.    No friction rub. No gallop.  Pulmonary:     Effort: Pulmonary effort is normal.     Breath sounds: Normal breath sounds.  Abdominal:     General: Abdomen is flat. Bowel sounds are normal.     Palpations: Abdomen is soft.  Musculoskeletal:        General: No swelling. Normal range of motion.     Cervical back: Normal range of motion and neck supple.  Skin:    General: Skin is warm and dry.  Neurological:     General: No focal deficit present.     Mental Status: He is oriented to person, place, and time.  Psychiatric:        Mood and Affect: Mood normal.     Lab Results Lab Results  Component Value Date   WBC 11.1 (H) 04/01/2022   HGB 9.7 (L) 04/01/2022   HCT 30.0 (L) 04/01/2022   MCV 87.0 04/01/2022   PLT 137 (L) 04/01/2022    Lab Results  Component Value Date   CREATININE 1.55 (H) 04/02/2022   BUN 18 04/02/2022   NA 146 (H) 04/02/2022   K 3.8 04/02/2022   CL 111 04/02/2022   CO2 23 04/02/2022    Lab Results  Component Value Date   ALT 158 (H) 04/02/2022   AST 56 (H) 04/02/2022   ALKPHOS 51 04/02/2022   BILITOT 0.2 (L) 04/02/2022       Laurice Record, [place] for Infectious Disease [facility] 04/02/2022, 11:15 AM

## 2022-04-02 NOTE — TOC Progression Note (Signed)
Transition of Care [facility]) - Progression Note    Patient Details  Name: Sergio [name] MRN: [id number] Date of Birth: 1933/05/02  Transition of Care [facility]) CM/SW [place], LCSW Phone Number: 04/02/2022, 11:37 AM  Clinical Narrative:    CSW spoke with patient's son, Ron, and provided SNF bed offers. He has selected [facility]. CSW confirming they will have a bed available once medically stable, then will start insurance authorization.   CSW also advised son to consider completing a Medicaid application for patient in the event insurance denies rehab. He stated he will ask his cousin to assist him with that process.    Expected Discharge Plan: [place] Barriers to Discharge: Continued Medical Work up, Ship broker, SNF Pending bed offer  Expected Discharge Plan and Services Expected Discharge Plan: [place] In-house Referral: Clinical Social Work   Post Acute Care Choice: [place] Living arrangements for the past 2 months: Single Family Home                                       Social Determinants of Health (SDOH) Interventions    Readmission Risk Interventions     No data to display

## 2022-04-02 NOTE — Progress Notes (Signed)
PHARMACY CONSULT NOTE FOR:  OUTPATIENT  PARENTERAL ANTIBIOTIC THERAPY (OPAT)  Indication: Aerococcus bacteremia Regimen: Ceftriaxone 2 gm IV Q 24 hours  End date: 04/14/22   IV antibiotic discharge orders are pended. To discharging provider:  please sign these orders via discharge navigator,  Select New Orders & click on the button choice - Manage This Unsigned Work.     Thank you for allowing pharmacy to be a part of this patient's care.  Jimmy Footman, PharmD, BCPS, BCIDP Infectious Diseases Clinical Pharmacist Phone: [phone] 04/02/2022, 11:23 AM

## 2022-04-02 NOTE — Progress Notes (Signed)
PT Cancellation Note  Patient Details Name: ZHANE BLUITT MRN: [id number] DOB: August 15, 1933   Cancelled Treatment:    Reason Eval/Treat Not Completed: (P) Other (comment) (pt eating dinner, defers PT session until done. Bed placed in more upright chair posture for safety/reduced aspiration risk.) Did not have time to reattempt PT session, will continue efforts next date per PT plan of care.   Kara Pacer Marriah Sanderlin 04/02/2022, 5:41 PM

## 2022-04-02 NOTE — Progress Notes (Addendum)
PROGRESS NOTE        PATIENT DETAILS Name: Sergio [name] Age: 88 y.o. Sex: male Date of Birth: [birth year] Admit Date: 03/27/2022 Admitting Physician Rhetta Mura, DO [id number], Arlie Solomons, MD  Brief Summary: Patient is a 88 y.o.  male with history of DM-2, HTN, CKD stage IIIb, chronic HFpEF, suspected dementia/cognitive dysfunction at baseline-presented with confusion-found to have severe hyponatremia.  Significant events: 6/30>> AMS-hypernatremia-admit to [facility]  Significant studies: 6/30>> TSH: 4.8 (minimally elevated)-FT4: Normal 6/30>> NH 4: 53 6/30>> CXR: No PNA 6/30>> CT head: No acute findings 6/30>> CT abdomen/pelvis: No hydronephrosis-cholelithiasis without inflammatory change-enlarged prostate. 7/01>> Echo: EF 65-70%, no regional wall motion abnormality. 7/04>> acute hepatitis serology: Negative 7/06>> vitamin B12: [phone]>> RPR: Reactive  Significant microbiology data: 6/30>> blood culture: Aerococcus urinae in 1/2  6/30>> urine culture: 60,000 colonies of Proteus/100,000 colonies of Aerococcus 6/30>> COVID/influenza PCR: Negative   Procedures:   Consults: ID  Subjective: Remains pleasantly confused.  Objective: Vitals: Blood pressure (!) 155/62, pulse 79, temperature 98.6 F (37 C), temperature source Oral, resp. rate 20, height '5\' 5"'$  (1.651 m), weight 58.2 kg, SpO2 98 %.   Exam: Gen Exam: Not in distress-remains pleasantly confused. HEENT:atraumatic, normocephalic Chest: B/L clear to auscultation anteriorly CVS:S1S2 regular Abdomen:soft non tender, non distended Extremities:no edema Neurology: Non focal Skin: no rash   Pertinent Labs/Radiology:    Latest Ref Rng & Units 04/01/2022    1:04 AM 03/31/2022    1:25 AM 03/30/2022   12:22 AM  CBC  WBC 4.0 - 10.5 K/uL 11.1  11.6  15.0   Hemoglobin 13.0 - 17.0 g/dL 9.7  9.1  10.2   Hematocrit 39.0 - 52.0 % 30.0  28.1  32.1   Platelets 150 - 400 K/uL 137  115  109     Lab  Results  Component Value Date   NA 146 (H) 04/02/2022   K 3.8 04/02/2022   CL 111 04/02/2022   CO2 23 04/02/2022      Assessment/Plan: Acute metabolic encephalopathy: Due to a combination of AKI/hypernatremia-suspect may have some amount of cognitive dysfunction at baseline.  No family at bedside.  Hypernatremia: Improved-due to dehydration/poor oral intake.  Continue to encourage oral intake-follow electrolytes.  AKI on CKD stage IIIb: Hemodynamically mediated-improving with supportive care.  Non-STEMI: Given advanced dementia/AKI-managed medically-no wall motion abnormality on echo-EF preserved-continue aspirin//beta-blocker-start statin once CK/LFTs improve further.  Addendum: Chart reviewed with cardiologist-Dr. Croitoru-continue supportive care-recommends outpatient nuclear stress test -once he is recovered from his acute illness  Severe sepsis due to Aerococcus bacteremia/UTI: Febrile to 101 on initial presentation-sepsis physiology has resolved-appreciate ID input-recommendations Rocephin x2 weeks-end date 7/18.  Proteus UTI: Suspect this is a contaminant-however already on Rocephin for above.  Transaminitis: Probably multifactorial in the setting of myolysis/sepsis-repeat liver enzymes periodically.  Acute hepatitis serology negative.  Rhabdomyolysis: Due to likely being down-CK levels downtrending-follow periodically.  Normocytic anemia: Due to acute illness-no evidence of blood loss  HTN: BP well controlled-continue metoprolol-follow/optimize.   DM-2 (A1c 6.3 on 6/30): CBG stable with SSI  Recent Labs    04/02/22 0629 04/02/22 0801 04/02/22 1330  GLUCAP 132* 100* 162*     BPH: Per nursing staff-never required Foley catheter insertion this admission-on Flomax-monitor with periodic bladder scans.  Reactive RPR titer: Awaiting antibody test-we will await further recommendations from infectious disease.  ?  Chronic cognitive dysfunction: Unclear baseline-no family  at bedside-but pleasantly confused-suspect he has some amount of chronic cognitive dysfunction at baseline..  Pressure Ulcer: Pressure Injury 03/28/22 Hip Left;Lateral Stage 2 -  Partial thickness loss of dermis presenting as a shallow open injury with a red, pink wound bed without slough. 2 L hip wounds (Active)  03/28/22 1400  Location: Hip  Location Orientation: Left;Lateral  Staging: Stage 2 -  Partial thickness loss of dermis presenting as a shallow open injury with a red, pink wound bed without slough.  Wound Description (Comments): 2 L hip wounds  Present on Admission: Yes  Dressing Type Foam - Lift dressing to assess site every shift 04/02/22 0800    BMI Estimated body mass index is 21.35 kg/m as calculated from the following:   Height as of this encounter: '5\' 5"'$  (1.651 m).   Weight as of this encounter: 58.2 kg.   Code status:   Code Status: Full Code   DVT Prophylaxis: heparin injection 5,000 Units Start: 03/31/22 1400 SCDs Start: 03/27/22 2044    Family Communication: Son-[name]-[phone]-left voicemail on 7/6.   Disposition Plan: Status is: Inpatient Remains inpatient appropriate because: Bacteremia/hyponatremia-resolving encephalopathy-noted stable for discharge.   Planned Discharge Destination:Skilled nursing facility   Diet: Diet Order             Diet regular Room service appropriate? No; Fluid consistency: Thin  Diet effective now                     Antimicrobial agents: Anti-infectives (From admission, onward)    Start     Dose/Rate Route Frequency Ordered Stop   04/01/22 1800  cefTRIAXone (ROCEPHIN) 2 g in sodium chloride 0.9 % 100 mL IVPB        2 g 200 mL/hr over 30 Minutes Intravenous Every 24 hours 04/01/22 1205 04/14/22 2359   03/27/22 1715  cefTRIAXone (ROCEPHIN) 1 g in sodium chloride 0.9 % 100 mL IVPB  Status:  Discontinued        1 g 200 mL/hr over 30 Minutes Intravenous Every 24 hours 03/27/22 1715 04/01/22 1205   03/27/22  1715  vancomycin (VANCOREADY) IVPB 1250 mg/250 mL        1,250 mg 166.7 mL/hr over 90 Minutes Intravenous  Once 03/27/22 1715 03/27/22 2048        MEDICATIONS: Scheduled Meds:  aspirin  81 mg Oral Daily   heparin injection (subcutaneous)  5,000 Units Subcutaneous Q8H   insulin aspart  0-9 Units Subcutaneous TID AC & HS   metoprolol tartrate  12.5 mg Oral BID   tamsulosin  0.4 mg Oral Daily   Continuous Infusions:  cefTRIAXone (ROCEPHIN)  IV Stopped (04/01/22 1833)   PRN Meds:.acetaminophen **OR** acetaminophen   I have personally reviewed following labs and imaging studies  LABORATORY DATA: CBC: Recent Labs  Lab 03/27/22 1737 03/27/22 1810 03/28/22 0515 03/29/22 0212 03/30/22 0022 03/31/22 0125 04/01/22 0104  WBC 20.1*  --  20.4* 17.2* 15.0* 11.6* 11.1*  NEUTROABS 18.3*  --  17.5*  --   --   --   --   HGB 13.0   < > 12.0* 11.0* 10.2* 9.1* 9.7*  HCT 42.4   < > 38.2* 35.6* 32.1* 28.1* 30.0*  MCV 89.6  --  89.0 89.0 88.9 86.5 87.0  PLT 235  --  173 124* 109* 115* 137*   < > = values in this interval not displayed.     Basic Metabolic Panel: Recent Labs  Lab 03/27/22 2002 03/28/22 0515 03/28/22 0912 03/29/22 1610 03/30/22 0022 03/31/22 0125 04/01/22 0104 04/02/22 0138  NA  --  158*   < > 153* 149* 144 146* 146*  K  --  3.7   < > 3.8 4.2 3.3* 3.7 3.8  CL  --  125*   < > 118* 115* 114* 119* 111  CO2  --  25   < > '24 24 24 23 23  '$ GLUCOSE  --  184*   < > 131* 179* 163* 81 120*  BUN  --  66*   < > 50* 41* 27* 21 18  CREATININE  --  3.06*   < > 2.28* 2.10* 1.60* 1.51* 1.55*  CALCIUM  --  8.7*   < > 8.2* 8.2* 7.7* 7.9* 7.9*  MG 2.6* 2.6*  --   --   --   --   --   --   PHOS  --  3.3  --   --   --   --   --   --    < > = values in this interval not displayed.     GFR: Estimated Creatinine Clearance: 27.6 mL/min (A) (by C-G formula based on SCr of 1.55 mg/dL (H)).  Liver Function Tests: Recent Labs  Lab 03/28/22 0515 03/29/22 0212 03/30/22 0022  03/31/22 0125 04/02/22 0138  AST 136* 172* 449* 189* 56*  ALT 77* 136* 474* 334* 158*  ALKPHOS 47 43 56 49 51  BILITOT 0.9 1.0 0.5 0.4 0.2*  PROT 6.6 6.1* 6.0* 5.5* 5.9*  ALBUMIN 2.0* 1.8* 1.7* 1.6* 1.7*    No results for input(s): "LIPASE", "AMYLASE" in the last 168 hours. Recent Labs  Lab 03/27/22 1804  AMMONIA 53*     Coagulation Profile: Recent Labs  Lab 03/27/22 1737  INR 1.4*     Cardiac Enzymes: Recent Labs  Lab 03/27/22 1737 03/28/22 0515 03/29/22 0212 04/02/22 0138  CKTOTAL 1,858* 1,844* 756* 111     BNP (last 3 results) No results for input(s): "PROBNP" in the last 8760 hours.  Lipid Profile: No results for input(s): "CHOL", "HDL", "LDLCALC", "TRIG", "CHOLHDL", "LDLDIRECT" in the last 72 hours.  Thyroid Function Tests: No results for input(s): "TSH", "T4TOTAL", "FREET4", "T3FREE", "THYROIDAB" in the last 72 hours.  Anemia Panel: Recent Labs    04/02/22 0138  VITAMINB12 662    Urine analysis:    Component Value Date/Time   COLORURINE AMBER (A) 03/27/2022 2030   APPEARANCEUR CLOUDY (A) 03/27/2022 2030   LABSPEC 1.013 03/27/2022 2030   PHURINE 8.0 03/27/2022 2030   GLUCOSEU NEGATIVE 03/27/2022 2030   HGBUR MODERATE (A) 03/27/2022 2030   BILIRUBINUR NEGATIVE 03/27/2022 2030   BILIRUBINUR negative 10/01/2017 0901   BILIRUBINUR neg 04/02/2014 1605   KETONESUR NEGATIVE 03/27/2022 2030   PROTEINUR 100 (A) 03/27/2022 2030   UROBILINOGEN 0.2 10/01/2017 0901   UROBILINOGEN 0.2 02/12/2015 2205   NITRITE NEGATIVE 03/27/2022 2030   LEUKOCYTESUR SMALL (A) 03/27/2022 2030    Sepsis Labs: Lactic Acid, Venous    Component Value Date/Time   LATICACIDVEN 1.9 03/28/2022 0607    MICROBIOLOGY: Recent Results (from the past 240 hour(s))  Resp Panel by RT-PCR (Flu A&B, Covid) Anterior Nasal Swab     Status: None   Collection Time: 03/27/22  5:13 PM   Specimen: Anterior Nasal Swab  Result Value Ref Range Status   SARS Coronavirus 2 by RT PCR  NEGATIVE NEGATIVE Final    Comment: (NOTE) SARS-CoV-2 target nucleic acids  are NOT DETECTED.  The SARS-CoV-2 RNA is generally detectable in upper respiratory specimens during the acute phase of infection. The lowest concentration of SARS-CoV-2 viral copies this assay can detect is 138 copies/mL. A negative result does not preclude SARS-Cov-2 infection and should not be used as the sole basis for treatment or other patient management decisions. A negative result may occur with  improper specimen collection/handling, submission of specimen other than nasopharyngeal swab, presence of viral mutation(s) within the areas targeted by this assay, and inadequate number of viral copies(<138 copies/mL). A negative result must be combined with clinical observations, patient history, and epidemiological information. The expected result is Negative.  Fact Sheet for Patients:  EntrepreneurPulse.com.au  Fact Sheet for Healthcare Providers:  IncredibleEmployment.be  This test is no t yet approved or cleared by the [place] FDA and  has been authorized for detection and/or diagnosis of SARS-CoV-2 by FDA under an Emergency Use Authorization (EUA). This EUA will remain  in effect (meaning this test can be used) for the duration of the COVID-19 declaration under Section 564(b)(1) of the Act, 21 U.S.C.section 360bbb-3(b)(1), unless the authorization is terminated  or revoked sooner.       Influenza A by PCR NEGATIVE NEGATIVE Final   Influenza B by PCR NEGATIVE NEGATIVE Final    Comment: (NOTE) The Xpert Xpress SARS-CoV-2/FLU/RSV plus assay is intended as an aid in the diagnosis of influenza from Nasopharyngeal swab specimens and should not be used as a sole basis for treatment. Nasal washings and aspirates are unacceptable for Xpert Xpress SARS-CoV-2/FLU/RSV testing.  Fact Sheet for Patients: EntrepreneurPulse.com.au  Fact Sheet for  Healthcare Providers: IncredibleEmployment.be  This test is not yet approved or cleared by the [place] FDA and has been authorized for detection and/or diagnosis of SARS-CoV-2 by FDA under an Emergency Use Authorization (EUA). This EUA will remain in effect (meaning this test can be used) for the duration of the COVID-19 declaration under Section 564(b)(1) of the Act, 21 U.S.C. section 360bbb-3(b)(1), unless the authorization is terminated or revoked.  Performed at [facility], [place] [address] [place], [place] [address]   Urine Culture     Status: Abnormal   Collection Time: 03/27/22  5:13 PM   Specimen: In/Out Cath Urine  Result Value Ref Range Status   Specimen Description IN/OUT CATH URINE  Final   Special Requests NONE  Final   Culture (A)  Final    60,000 COLONIES/mL PROTEUS MIRABILIS >=100,000 COLONIES/mL AEROCOCCUS SPECIES Standardized susceptibility testing for this organism is not available. Performed at [facility], [place] [address] [place], [place] [address]    Report Status 03/30/2022 FINAL  Final   Organism ID, Bacteria PROTEUS MIRABILIS (A)  Final      Susceptibility   Proteus mirabilis - MIC*    AMPICILLIN <=2 SENSITIVE Sensitive     CEFAZOLIN <=4 SENSITIVE Sensitive     CEFEPIME <=0.12 SENSITIVE Sensitive     CEFTRIAXONE <=0.25 SENSITIVE Sensitive     CIPROFLOXACIN <=0.25 SENSITIVE Sensitive     GENTAMICIN <=1 SENSITIVE Sensitive     IMIPENEM 2 SENSITIVE Sensitive     NITROFURANTOIN 128 RESISTANT Resistant     TRIMETH/SULFA <=20 SENSITIVE Sensitive     AMPICILLIN/SULBACTAM <=2 SENSITIVE Sensitive     PIP/TAZO <=4 SENSITIVE Sensitive     * 60,000 COLONIES/mL PROTEUS MIRABILIS  Blood Culture (routine x 2)     Status: None   Collection Time: 03/27/22  5:37 PM   Specimen: BLOOD  Result Value Ref Range Status   Specimen Description BLOOD RIGHT ANTECUBITAL  Final   Special Requests Blood Culture adequate volume  Final    Culture   Final    NO GROWTH 5 DAYS Performed at [facility], [place] [address], [place], [place] [address]    Report Status 04/01/2022 FINAL  Final  Blood Culture (routine x 2)     Status: Abnormal   Collection Time: 03/27/22  6:03 PM   Specimen: BLOOD  Result Value Ref Range Status   Specimen Description BLOOD SITE NOT SPECIFIED  Final   Special Requests   Final    BOTTLES DRAWN AEROBIC AND ANAEROBIC Blood Culture adequate volume   Culture  Setup Time   Final    GRAM POSITIVE COCCI IN CLUSTERS IN BOTH AEROBIC AND ANAEROBIC BOTTLES CRITICAL RESULT CALLED TO, READ BACK BY AND VERIFIED WITH: PHARMD G.BARR AT 2694 ON 03/29/2022 BY T.SAAD.    Culture (A)  Final    AEROCOCCUS URINAE Standardized susceptibility testing for this organism is not available. Performed at [facility], [place] [address] [place], [place] [address]    Report Status 04/01/2022 FINAL  Final  Blood Culture ID Panel (Reflexed)     Status: None   Collection Time: 03/27/22  6:03 PM  Result Value Ref Range Status   Enterococcus faecalis NOT DETECTED NOT DETECTED Final   Enterococcus Faecium NOT DETECTED NOT DETECTED Final   Listeria monocytogenes NOT DETECTED NOT DETECTED Final   Staphylococcus species NOT DETECTED NOT DETECTED Final   Staphylococcus aureus (BCID) NOT DETECTED NOT DETECTED Final   Staphylococcus epidermidis NOT DETECTED NOT DETECTED Final   Staphylococcus lugdunensis NOT DETECTED NOT DETECTED Final   Streptococcus species NOT DETECTED NOT DETECTED Final   Streptococcus agalactiae NOT DETECTED NOT DETECTED Final   Streptococcus pneumoniae NOT DETECTED NOT DETECTED Final   Streptococcus pyogenes NOT DETECTED NOT DETECTED Final   A.calcoaceticus-baumannii NOT DETECTED NOT DETECTED Final   Bacteroides fragilis NOT DETECTED NOT DETECTED Final   Enterobacterales NOT DETECTED NOT DETECTED Final   Enterobacter cloacae complex NOT DETECTED NOT DETECTED Final   Escherichia coli NOT DETECTED NOT  DETECTED Final   Klebsiella aerogenes NOT DETECTED NOT DETECTED Final   Klebsiella oxytoca NOT DETECTED NOT DETECTED Final   Klebsiella pneumoniae NOT DETECTED NOT DETECTED Final   Proteus species NOT DETECTED NOT DETECTED Final   Salmonella species NOT DETECTED NOT DETECTED Final   Serratia marcescens NOT DETECTED NOT DETECTED Final   Haemophilus influenzae NOT DETECTED NOT DETECTED Final   Neisseria meningitidis NOT DETECTED NOT DETECTED Final   Pseudomonas aeruginosa NOT DETECTED NOT DETECTED Final   Stenotrophomonas maltophilia NOT DETECTED NOT DETECTED Final   Candida albicans NOT DETECTED NOT DETECTED Final   Candida auris NOT DETECTED NOT DETECTED Final   Candida glabrata NOT DETECTED NOT DETECTED Final   Candida krusei NOT DETECTED NOT DETECTED Final   Candida parapsilosis NOT DETECTED NOT DETECTED Final   Candida tropicalis NOT DETECTED NOT DETECTED Final   Cryptococcus neoformans/gattii NOT DETECTED NOT DETECTED Final    Comment: Performed at [facility], [address] [place],  [address]    RADIOLOGY STUDIES/RESULTS: No results found.   LOS: 6 days   Oren Binet, MD  [facility]    To contact the attending provider between 7A-7P or the covering provider during after hours 7P-7A, please log into the web site [email or web address] and access using universal [place] password for that web  site. If you do not have the password, please call the hospital operator.  04/02/2022, 2:13 PM

## 2022-04-02 NOTE — Plan of Care (Signed)
  Problem: Education: Goal: Ability to describe self-care measures that may prevent or decrease complications (Diabetes Survival Skills Education) will improve Outcome: Progressing Goal: Individualized Educational Video(s) Outcome: Progressing   Problem: Coping: Goal: Ability to adjust to condition or change in health will improve Outcome: Progressing   Problem: Fluid Volume: Goal: Ability to maintain a balanced intake and output will improve Outcome: Progressing   Problem: Health Behavior/Discharge Planning: Goal: Ability to identify and utilize available resources and services will improve Outcome: Progressing Goal: Ability to manage health-related needs will improve Outcome: Progressing   Problem: Metabolic: Goal: Ability to maintain appropriate glucose levels will improve Outcome: Progressing   Problem: Nutritional: Goal: Maintenance of adequate nutrition will improve Outcome: Progressing Goal: Progress toward achieving an optimal weight will improve Outcome: Progressing   Problem: Skin Integrity: Goal: Risk for impaired skin integrity will decrease Outcome: Progressing   Problem: Tissue Perfusion: Goal: Adequacy of tissue perfusion will improve Outcome: Progressing   Problem: Education: Goal: Knowledge of General Education information will improve Description: Including pain rating scale, medication(s)/side effects and non-pharmacologic comfort measures Outcome: Progressing   Problem: Health Behavior/Discharge Planning: Goal: Ability to manage health-related needs will improve Outcome: Progressing   Problem: Clinical Measurements: Goal: Ability to maintain clinical measurements within normal limits will improve Outcome: Progressing Goal: Will remain free from infection Outcome: Progressing Goal: Diagnostic test results will improve Outcome: Progressing Goal: Respiratory complications will improve Outcome: Progressing Goal: Cardiovascular complication will  be avoided Outcome: Progressing   Problem: Activity: Goal: Risk for activity intolerance will decrease Outcome: Progressing   Problem: Nutrition: Goal: Adequate nutrition will be maintained Outcome: Progressing   Problem: Coping: Goal: Level of anxiety will decrease Outcome: Progressing   Problem: Elimination: Goal: Will not experience complications related to bowel motility Outcome: Progressing Goal: Will not experience complications related to urinary retention Outcome: Progressing   Problem: Pain Managment: Goal: General experience of comfort will improve Outcome: Progressing   Problem: Safety: Goal: Ability to remain free from injury will improve Outcome: Progressing   Problem: Skin Integrity: Goal: Risk for impaired skin integrity will decrease Outcome: Progressing   Problem: Fluid Volume: Goal: Hemodynamic stability will improve Outcome: Progressing   Problem: Clinical Measurements: Goal: Diagnostic test results will improve Outcome: Progressing Goal: Signs and symptoms of infection will decrease Outcome: Progressing   Problem: Respiratory: Goal: Ability to maintain adequate ventilation will improve Outcome: Progressing

## 2022-04-03 DIAGNOSIS — N179 Acute kidney failure, unspecified: Secondary | ICD-10-CM | POA: Diagnosis not present

## 2022-04-03 DIAGNOSIS — E870 Hyperosmolality and hypernatremia: Secondary | ICD-10-CM | POA: Diagnosis not present

## 2022-04-03 DIAGNOSIS — B9689 Other specified bacterial agents as the cause of diseases classified elsewhere: Secondary | ICD-10-CM | POA: Diagnosis not present

## 2022-04-03 DIAGNOSIS — E1122 Type 2 diabetes mellitus with diabetic chronic kidney disease: Secondary | ICD-10-CM | POA: Diagnosis not present

## 2022-04-03 DIAGNOSIS — R7881 Bacteremia: Secondary | ICD-10-CM | POA: Diagnosis not present

## 2022-04-03 DIAGNOSIS — N390 Urinary tract infection, site not specified: Secondary | ICD-10-CM | POA: Diagnosis not present

## 2022-04-03 DIAGNOSIS — G9341 Metabolic encephalopathy: Secondary | ICD-10-CM | POA: Diagnosis not present

## 2022-04-03 LAB — GLUCOSE, CAPILLARY
Glucose-Capillary: 124 mg/dL — ABNORMAL HIGH (ref 70–99)
Glucose-Capillary: 99 mg/dL (ref 70–99)
Glucose-Capillary: 233 mg/dL — ABNORMAL HIGH (ref 70–99)
Glucose-Capillary: 144 mg/dL — ABNORMAL HIGH (ref 70–99)

## 2022-04-03 LAB — APTT: aPTT: 44 seconds — ABNORMAL HIGH (ref 24–36)

## 2022-04-03 LAB — PROTIME-INR
INR: 1.2 (ref 0.8–1.2)
Prothrombin Time: 14.8 seconds (ref 11.4–15.2)

## 2022-04-03 MED ORDER — LIDOCAINE HCL 1 % IJ SOLN
5.0000 mL | Freq: Once | INTRAMUSCULAR | Status: AC
Start: 2022-04-03 — End: 2022-04-03
  Administered 2022-04-03: 5 mL
  Filled 2022-04-03 (×2): qty 5

## 2022-04-03 MED ORDER — LIDOCAINE HCL (CARDIAC) PF 100 MG/5ML IV SOSY
PREFILLED_SYRINGE | INTRAVENOUS | Status: AC
  Filled 2022-04-03: qty 5

## 2022-04-03 NOTE — Progress Notes (Signed)
Physical Therapy Treatment Patient Details Name: Sergio [name] MRN: [id number] DOB: 04-02-1933 Today's Date: 04/03/2022   History of Present Illness Pt is a 88 y/o male presenting on 6/30 with AMS.  Found with acute hypernatremia, elevated troponin due to severe sepsis from UTI and dehydration, AKI. PMHx:  chronic urinary retention, CKD3b, DM, CHF, HTN, PE.    PT Comments    Pt received in supine, lethargic but opening eyes to command and responding to ~50% of simple 1-step commands with increased time. Pt instructed on safe body mechanics with bed mobility and transfers and requiring up to +2 maxA for functional mobility tasks. Pt appearing more fatigued after squat pivot to chair, alarm on for safety. Pt unable to stand fully upright with +2 assist and RW today. Unable to reach family yet to ask about pt PLOF and pt unable to recall how he mobilized OOB previous to hospitalization. Pt continues to benefit from PT services to progress toward functional mobility goals.    Recommendations for follow up therapy are one component of a multi-disciplinary discharge planning process, led by the attending physician.  Recommendations may be updated based on patient status, additional functional criteria and insurance authorization.  Follow Up Recommendations  Skilled nursing-short term rehab (<3 hours/day)     Assistance Recommended at Discharge Frequent or constant Supervision/Assistance  Patient can return home with the following Two people to help with walking and/or transfers;A lot of help with bathing/dressing/bathroom;Assistance with cooking/housework;Assistance with feeding;Direct supervision/assist for medications management;Direct supervision/assist for financial management;Assist for transportation;Help with stairs or ramp for entrance   Equipment Recommendations  None recommended by PT (TBD post-acute)    Recommendations for Other Services       Precautions / Restrictions  Precautions Precautions: Fall Precaution Comments: confusion, LE wounds Restrictions Weight Bearing Restrictions: No     Mobility  Bed Mobility Overal bed mobility: Needs Assistance Bed Mobility: Supine to Sit, Sit to Supine, Rolling Rolling: Mod assist   Supine to sit: Mod assist, Max assist, +2 for physical assistance, +2 for safety/equipment Sit to supine: Max assist   General bed mobility comments: Pt with poor initiation, able to assist some once initiated, however 2nd sit up, pt required max +2, most likely due to fatigue    Transfers Overall transfer level: Needs assistance Equipment used: Rolling walker (2 wheels), 2 person hand held assist Transfers: Sit to/from Stand, Bed to chair/wheelchair/BSC Sit to Stand: Max assist, +2 physical assistance, +2 safety/equipment     Squat pivot transfers: Max assist, +2 safety/equipment, +2 physical assistance     General transfer comment: Pt attempting to assist with stand and transfer, unable to clear his bottom more than 2 inches from the bed. Pt able to activate BLE a little more with squat pivot to chair. Pt with posterior lean when attempting STS and c/o fear of falls. B feet/knees blocked with STS attempts x2          Balance Overall balance assessment: Needs assistance Sitting-balance support: No upper extremity supported, Feet supported Sitting balance-Leahy Scale: Fair Sitting balance - Comments: min guard to supervision statically at EOB during ADLs   Standing balance support: Bilateral upper extremity supported, During functional activity Standing balance-Leahy Scale: Zero Standing balance comment: Unable to fully come to standing despite heavy +2 assist                            Cognition Arousal/Alertness: Lethargic Behavior During Therapy: Flat affect  Overall Cognitive Status: No family/caregiver present to determine baseline cognitive functioning Area of Impairment: Orientation, Attention,  Memory, Following commands, Safety/judgement, Awareness, Problem solving                 Orientation Level: Disoriented to, Place, Time, Situation Current Attention Level: Sustained Memory: Decreased short-term memory, Decreased recall of precautions Following Commands: Follows one step commands inconsistently, Follows one step commands with increased time Safety/Judgement: Decreased awareness of deficits, Decreased awareness of safety Awareness: Intellectual Problem Solving: Slow processing, Decreased initiation, Difficulty sequencing, Requires verbal cues, Requires tactile cues General Comments: Followed approximately 50% of commands this session, reports "I don't know" to most questions. Does better with multimodal cues, increased time to process.        Exercises Other Exercises Other Exercises: seated BLE AAROM: LAQ x10 reps Other Exercises: supine BLE AAROM: heel slides, hip abduction, ankle pumps x5 reps ea Other Exercises: STS x2 trials (partial stands as able) Other Exercises: lateral leans with elbow taps x3 reps ea side    General Comments General comments (skin integrity, edema, etc.): BP 129/66 (83) supine prior to OOB; BP 131/65 (83) after transfer to recliner; HR 77 bpm      Pertinent Vitals/Pain Pain Assessment Pain Assessment: Faces Faces Pain Scale: Hurts little more Pain Location: B LES (R>L) with movement Pain Descriptors / Indicators: Discomfort, Grimacing, Guarding Pain Intervention(s): Monitored during session, Repositioned, Limited activity within patient's tolerance     PT Goals (current goals can now be found in the care plan section) Acute Rehab PT Goals PT Goal Formulation: Patient unable to participate in goal setting Time For Goal Achievement: 04/13/22 Progress towards PT goals: Progressing toward goals    Frequency    Min 2X/week      PT Plan Current plan remains appropriate    Co-evaluation PT/OT/SLP Co-Evaluation/Treatment:  Yes Reason for Co-Treatment: Complexity of the patient's impairments (multi-system involvement);For patient/therapist safety;To address functional/ADL transfers PT goals addressed during session: Mobility/safety with mobility;Balance;Proper use of DME;Strengthening/ROM OT goals addressed during session: Strengthening/ROM;Other (comment) (transfers)      AM-PAC PT "6 Clicks" Mobility   Outcome Measure  Help needed turning from your back to your side while in a flat bed without using bedrails?: A Lot Help needed moving from lying on your back to sitting on the side of a flat bed without using bedrails?: A Lot Help needed moving to and from a bed to a chair (including a wheelchair)?: Total Help needed standing up from a chair using your arms (e.g., wheelchair or bedside chair)?: Total Help needed to walk in hospital room?: Total Help needed climbing 3-5 steps with a railing? : Total 6 Click Score: 8    End of Session Equipment Utilized During Treatment: Gait belt Activity Tolerance: Patient limited by lethargy Patient left: in chair;with call bell/phone within reach;with chair alarm set Nurse Communication: Mobility status;Need for lift equipment;Other (comment) (+2 dependent squat pivot with bed pads vs hoyer for return to bed from chair) PT Visit Diagnosis: Muscle weakness (generalized) (M62.81);Other abnormalities of gait and mobility (R26.89);Difficulty in walking, not elsewhere classified (R26.2);Pain     Time: 8416-6063 PT Time Calculation (min) (ACUTE ONLY): 34 min  Charges:  $Therapeutic Exercise: 8-22 mins                     Aliah Eriksson P., PTA Acute Rehabilitation Services Secure Chat Preferred 9a-5:30pm Office: [place] 04/03/2022, 2:51 PM

## 2022-04-03 NOTE — Progress Notes (Addendum)
Received a phone call from Microbiology that gram positive cocci are now growing in repeat blood cultures from 7/6. It appears both sets are now positive with gram positive cocci in clusters in one set and gram positive cocci in pairs in the other. Informed Dr. Candiss Norse with ID.   Jimmy Footman, PharmD, BCPS, BCIDP Infectious Diseases Clinical Pharmacist Phone: [phone] 04/03/2022 2:10 PM

## 2022-04-03 NOTE — Progress Notes (Signed)
PROGRESS NOTE        PATIENT DETAILS Name: Sergio [name] Age: 88 y.o. Sex: male Date of Birth: [birth year] Admit Date: 03/27/2022 Admitting Physician Rhetta Mura, DO OMV:[id number], Arlie Solomons, MD  Brief Summary: Patient is a 88 y.o.  male with history of DM-2, HTN, CKD stage IIIb, chronic HFpEF, suspected dementia/cognitive dysfunction at baseline-presented with confusion-found to have severe hyponatremia.  Significant events: 6/30>> AMS-hypernatremia-admit to [facility]  Significant studies: 6/30>> TSH: 4.8 (minimally elevated)-FT4: Normal 6/30>> NH 4: 53 6/30>> CXR: No PNA 6/30>> CT head: No acute findings 6/30>> CT abdomen/pelvis: No hydronephrosis-cholelithiasis without inflammatory change-enlarged prostate. 7/01>> Echo: EF 65-70%, no regional wall motion abnormality. 7/04>> acute hepatitis serology: Negative 7/06>> vitamin B12: 662 7/06>> RPR: Reactive 7/06>> T. pallidum AB: Reactive   Significant microbiology data: 6/30>> blood culture: Aerococcus urinae in 1/2  6/30>> urine culture: 60,000 colonies of Proteus/100,000 colonies of Aerococcus 6/30>> COVID/influenza PCR: Negative   Procedures:   Consults: ID  Subjective: No major issues-remains pleasantly confused.  Objective: Vitals: Blood pressure (!) 143/60, pulse 81, temperature 98 F (36.7 C), temperature source Axillary, resp. rate (!) 23, height '5\' 5"'$  (1.651 m), weight 61 kg, SpO2 99 %.   Exam: Gen Exam: Pleasantly confused-not in any distress HEENT:atraumatic, normocephalic Chest: B/L clear to auscultation anteriorly CVS:S1S2 regular Abdomen:soft non tender, non distended Extremities:no edema Neurology: Non focal Skin: no rash   Pertinent Labs/Radiology:    Latest Ref Rng & Units 04/01/2022    1:04 AM 03/31/2022    1:25 AM 03/30/2022   12:22 AM  CBC  WBC 4.0 - 10.5 K/uL 11.1  11.6  15.0   Hemoglobin 13.0 - 17.0 g/dL 9.7  9.1  10.2   Hematocrit 39.0 - 52.0 % 30.0  28.1  32.1    Platelets 150 - 400 K/uL 137  115  109     Lab Results  Component Value Date   NA 146 (H) 04/02/2022   K 3.8 04/02/2022   CL 111 04/02/2022   CO2 23 04/02/2022      Assessment/Plan: Acute metabolic encephalopathy: Due to a combination of AKI/hypernatremia-suspect may have some amount of cognitive dysfunction at baseline (see below).   Hypernatremia: Improved-due to dehydration/poor oral intake.  Continue to encourage oral intake-follow electrolytes.  AKI on CKD stage IIIb: Hemodynamically mediated-improving with supportive care.  Non-STEMI: Given advanced dementia/AKI-managed medically-no wall motion abnormality on echo-EF preserved-continue aspirin//beta-blocker-start statin once CK/LFTs improve further.  Discussed with cardiologist-Dr. Theador Hawthorne on 7/6-who reviewed his chart-recommendations for outpatient nuclear stress test once he has recovered from his acute illness.  Severe sepsis due to Aerococcus bacteremia/UTI: Febrile to 101 on initial presentation-sepsis physiology has resolved-appreciate ID input-recommendations Rocephin x2 weeks-end date 7/18.  Proteus UTI: Suspect this is a contaminant-however already on Rocephin for above.  Transaminitis: Probably multifactorial in the setting of myolysis/sepsis-repeat liver enzymes periodically.  Acute hepatitis serology negative.  Rhabdomyolysis: Due to likely being down-CK levels downtrending-follow periodically.  Normocytic anemia: Due to acute illness-no evidence of blood loss  HTN: BP well controlled-continue metoprolol-follow/optimize.   DM-2 (A1c 6.3 on 6/30): CBG stable with SSI  Recent Labs    04/02/22 2301 04/03/22 0611 04/03/22 0806  GLUCAP 162* 124* 99     BPH: Per nursing staff-never required Foley catheter insertion this admission-on Flomax-monitor with periodic bladder scans.  Reactive RPR titer-with positive treponema antibody: Given  cognitive issues-concern for pneumocephalus-neurology consulted for LP.   Per patient's son who this MD talked to on 7/7-patient has had cognitive issues for the past several months/few years.  Note-this MD explained the rationale for LP-benefit of LP-risks including infection/bleeding/nerve/spine injury-patient's son-understood the risk-and consented for the procedure.  Social worker-Nadia-and RN [place] witnessed.  ?  Chronic cognitive dysfunction: See above  Pressure Ulcer: Pressure Injury 03/28/22 Hip Left;Lateral Stage 2 -  Partial thickness loss of dermis presenting as a shallow open injury with a red, pink wound bed without slough. 2 L hip wounds (Active)  03/28/22 1400  Location: Hip  Location Orientation: Left;Lateral  Staging: Stage 2 -  Partial thickness loss of dermis presenting as a shallow open injury with a red, pink wound bed without slough.  Wound Description (Comments): 2 L hip wounds  Present on Admission: Yes  Dressing Type Foam - Lift dressing to assess site every shift 04/02/22 2100    BMI Estimated body mass index is 22.38 kg/m as calculated from the following:   Height as of this encounter: '5\' 5"'$  (1.651 m).   Weight as of this encounter: 61 kg.   Code status:   Code Status: Full Code   DVT Prophylaxis: SCDs Start: 03/27/22 2044    Family Communication: Son-[name]-spoke with him on 7/7   Disposition Plan: Status is: Inpatient Remains inpatient appropriate because: Bacteremia/hyponatremia-resolving encephalopathy-noted stable for discharge.   Planned Discharge Destination:Skilled nursing facility   Diet: Diet Order             Diet regular Room service appropriate? No; Fluid consistency: Thin  Diet effective now                     Antimicrobial agents: Anti-infectives (From admission, onward)    Start     Dose/Rate Route Frequency Ordered Stop   04/01/22 1800  cefTRIAXone (ROCEPHIN) 2 g in sodium chloride 0.9 % 100 mL IVPB        2 g 200 mL/hr over 30 Minutes Intravenous Every 24 hours  04/01/22 1205 04/14/22 2359   03/27/22 1715  cefTRIAXone (ROCEPHIN) 1 g in sodium chloride 0.9 % 100 mL IVPB  Status:  Discontinued        1 g 200 mL/hr over 30 Minutes Intravenous Every 24 hours 03/27/22 1715 04/01/22 1205   03/27/22 1715  vancomycin (VANCOREADY) IVPB 1250 mg/250 mL        1,250 mg 166.7 mL/hr over 90 Minutes Intravenous  Once 03/27/22 1715 03/27/22 2048        MEDICATIONS: Scheduled Meds:  aspirin  81 mg Oral Daily   insulin aspart  0-9 Units Subcutaneous TID AC & HS   metoprolol tartrate  25 mg Oral BID   tamsulosin  0.4 mg Oral Daily   Continuous Infusions:  cefTRIAXone (ROCEPHIN)  IV 2 g (04/02/22 1655)   PRN Meds:.acetaminophen **OR** acetaminophen   I have personally reviewed following labs and imaging studies  LABORATORY DATA: CBC: Recent Labs  Lab 03/27/22 1737 03/27/22 1810 03/28/22 0515 03/29/22 0212 03/30/22 0022 03/31/22 0125 04/01/22 0104  WBC 20.1*  --  20.4* 17.2* 15.0* 11.6* 11.1*  NEUTROABS 18.3*  --  17.5*  --   --   --   --   HGB 13.0   < > 12.0* 11.0* 10.2* 9.1* 9.7*  HCT 42.4   < > 38.2* 35.6* 32.1* 28.1* 30.0*  MCV 89.6  --  89.0 89.0 88.9 86.5 87.0  PLT 235  --  173 124* 109* 115* 137*   < > = values in this interval not displayed.     Basic Metabolic Panel: Recent Labs  Lab 03/27/22 2002 03/28/22 0515 03/28/22 0912 03/29/22 6063 03/30/22 0022 03/31/22 0125 04/01/22 0104 04/02/22 0138  NA  --  158*   < > 153* 149* 144 146* 146*  K  --  3.7   < > 3.8 4.2 3.3* 3.7 3.8  CL  --  125*   < > 118* 115* 114* 119* 111  CO2  --  25   < > '24 24 24 23 23  '$ GLUCOSE  --  184*   < > 131* 179* 163* 81 120*  BUN  --  66*   < > 50* 41* 27* 21 18  CREATININE  --  3.06*   < > 2.28* 2.10* 1.60* 1.51* 1.55*  CALCIUM  --  8.7*   < > 8.2* 8.2* 7.7* 7.9* 7.9*  MG 2.6* 2.6*  --   --   --   --   --   --   PHOS  --  3.3  --   --   --   --   --   --    < > = values in this interval not displayed.     GFR: Estimated Creatinine  Clearance: 29 mL/min (A) (by C-G formula based on SCr of 1.55 mg/dL (H)).  Liver Function Tests: Recent Labs  Lab 03/28/22 0515 03/29/22 0212 03/30/22 0022 03/31/22 0125 04/02/22 0138  AST 136* 172* 449* 189* 56*  ALT 77* 136* 474* 334* 158*  ALKPHOS 47 43 56 49 51  BILITOT 0.9 1.0 0.5 0.4 0.2*  PROT 6.6 6.1* 6.0* 5.5* 5.9*  ALBUMIN 2.0* 1.8* 1.7* 1.6* 1.7*    No results for input(s): "LIPASE", "AMYLASE" in the last 168 hours. Recent Labs  Lab 03/27/22 1804  AMMONIA 53*     Coagulation Profile: Recent Labs  Lab 03/27/22 1737  INR 1.4*     Cardiac Enzymes: Recent Labs  Lab 03/27/22 1737 03/28/22 0515 03/29/22 0212 04/02/22 0138  CKTOTAL 1,858* 1,844* 756* 111     BNP (last 3 results) No results for input(s): "PROBNP" in the last 8760 hours.  Lipid Profile: No results for input(s): "CHOL", "HDL", "LDLCALC", "TRIG", "CHOLHDL", "LDLDIRECT" in the last 72 hours.  Thyroid Function Tests: No results for input(s): "TSH", "T4TOTAL", "FREET4", "T3FREE", "THYROIDAB" in the last 72 hours.  Anemia Panel: Recent Labs    04/02/22 0138  VITAMINB12 662     Urine analysis:    Component Value Date/Time   COLORURINE AMBER (A) 03/27/2022 2030   APPEARANCEUR CLOUDY (A) 03/27/2022 2030   LABSPEC 1.013 03/27/2022 2030   PHURINE 8.0 03/27/2022 2030   GLUCOSEU NEGATIVE 03/27/2022 2030   HGBUR MODERATE (A) 03/27/2022 2030   BILIRUBINUR NEGATIVE 03/27/2022 2030   BILIRUBINUR negative 10/01/2017 0901   BILIRUBINUR neg 04/02/2014 1605   KETONESUR NEGATIVE 03/27/2022 2030   PROTEINUR 100 (A) 03/27/2022 2030   UROBILINOGEN 0.2 10/01/2017 0901   UROBILINOGEN 0.2 02/12/2015 2205   NITRITE NEGATIVE 03/27/2022 2030   LEUKOCYTESUR SMALL (A) 03/27/2022 2030    Sepsis Labs: Lactic Acid, Venous    Component Value Date/Time   LATICACIDVEN 1.9 03/28/2022 0607    MICROBIOLOGY: Recent Results (from the past 240 hour(s))  Resp Panel by RT-PCR (Flu A&B, Covid) Anterior  Nasal Swab     Status: None   Collection Time: 03/27/22  5:13 PM   Specimen: Anterior Nasal  Swab  Result Value Ref Range Status   SARS Coronavirus 2 by RT PCR NEGATIVE NEGATIVE Final    Comment: (NOTE) SARS-CoV-2 target nucleic acids are NOT DETECTED.  The SARS-CoV-2 RNA is generally detectable in upper respiratory specimens during the acute phase of infection. The lowest concentration of SARS-CoV-2 viral copies this assay can detect is 138 copies/mL. A negative result does not preclude SARS-Cov-2 infection and should not be used as the sole basis for treatment or other patient management decisions. A negative result may occur with  improper specimen collection/handling, submission of specimen other than nasopharyngeal swab, presence of viral mutation(s) within the areas targeted by this assay, and inadequate number of viral copies(<138 copies/mL). A negative result must be combined with clinical observations, patient history, and epidemiological information. The expected result is Negative.  Fact Sheet for Patients:  EntrepreneurPulse.com.au  Fact Sheet for Healthcare Providers:  IncredibleEmployment.be  This test is no t yet approved or cleared by the [place] FDA and  has been authorized for detection and/or diagnosis of SARS-CoV-2 by FDA under an Emergency Use Authorization (EUA). This EUA will remain  in effect (meaning this test can be used) for the duration of the COVID-19 declaration under Section 564(b)(1) of the Act, 21 U.S.C.section 360bbb-3(b)(1), unless the authorization is terminated  or revoked sooner.       Influenza A by PCR NEGATIVE NEGATIVE Final   Influenza B by PCR NEGATIVE NEGATIVE Final    Comment: (NOTE) The Xpert Xpress SARS-CoV-2/FLU/RSV plus assay is intended as an aid in the diagnosis of influenza from Nasopharyngeal swab specimens and should not be used as a sole basis for treatment. Nasal washings  and aspirates are unacceptable for Xpert Xpress SARS-CoV-2/FLU/RSV testing.  Fact Sheet for Patients: EntrepreneurPulse.com.au  Fact Sheet for Healthcare Providers: IncredibleEmployment.be  This test is not yet approved or cleared by the [place] FDA and has been authorized for detection and/or diagnosis of SARS-CoV-2 by FDA under an Emergency Use Authorization (EUA). This EUA will remain in effect (meaning this test can be used) for the duration of the COVID-19 declaration under Section 564(b)(1) of the Act, 21 U.S.C. section 360bbb-3(b)(1), unless the authorization is terminated or revoked.  Performed at [facility], [place] [address], [place], [place] [address]   Urine Culture     Status: Abnormal   Collection Time: 03/27/22  5:13 PM   Specimen: In/Out Cath Urine  Result Value Ref Range Status   Specimen Description IN/OUT CATH URINE  Final   Special Requests NONE  Final   Culture (A)  Final    60,000 COLONIES/mL PROTEUS MIRABILIS >=100,000 COLONIES/mL AEROCOCCUS SPECIES Standardized susceptibility testing for this organism is not available. Performed at [facility], [place] [address], [place], [place] [address]    Report Status 03/30/2022 FINAL  Final   Organism ID, Bacteria PROTEUS MIRABILIS (A)  Final      Susceptibility   Proteus mirabilis - MIC*    AMPICILLIN <=2 SENSITIVE Sensitive     CEFAZOLIN <=4 SENSITIVE Sensitive     CEFEPIME <=0.12 SENSITIVE Sensitive     CEFTRIAXONE <=0.25 SENSITIVE Sensitive     CIPROFLOXACIN <=0.25 SENSITIVE Sensitive     GENTAMICIN <=1 SENSITIVE Sensitive     IMIPENEM 2 SENSITIVE Sensitive     NITROFURANTOIN 128 RESISTANT Resistant     TRIMETH/SULFA <=20 SENSITIVE Sensitive     AMPICILLIN/SULBACTAM <=2 SENSITIVE Sensitive     PIP/TAZO <=4 SENSITIVE Sensitive     * 60,000  COLONIES/mL PROTEUS MIRABILIS  Blood Culture (routine x 2)     Status: None   Collection Time: 03/27/22  5:37 PM    Specimen: BLOOD  Result Value Ref Range Status   Specimen Description BLOOD RIGHT ANTECUBITAL  Final   Special Requests Blood Culture adequate volume  Final   Culture   Final    NO GROWTH 5 DAYS Performed at [facility], [place] [address], [place], [place] [address]    Report Status 04/01/2022 FINAL  Final  Blood Culture (routine x 2)     Status: Abnormal   Collection Time: 03/27/22  6:03 PM   Specimen: BLOOD  Result Value Ref Range Status   Specimen Description BLOOD SITE NOT SPECIFIED  Final   Special Requests   Final    BOTTLES DRAWN AEROBIC AND ANAEROBIC Blood Culture adequate volume   Culture  Setup Time   Final    GRAM POSITIVE COCCI IN CLUSTERS IN BOTH AEROBIC AND ANAEROBIC BOTTLES CRITICAL RESULT CALLED TO, READ BACK BY AND VERIFIED WITH: PHARMD G.BARR AT 2993 ON 03/29/2022 BY T.SAAD.    Culture (A)  Final    AEROCOCCUS URINAE Standardized susceptibility testing for this organism is not available. Performed at [facility], [place] [address] [place], [place] [address]    Report Status 04/01/2022 FINAL  Final  Blood Culture ID Panel (Reflexed)     Status: None   Collection Time: 03/27/22  6:03 PM  Result Value Ref Range Status   Enterococcus faecalis NOT DETECTED NOT DETECTED Final   Enterococcus Faecium NOT DETECTED NOT DETECTED Final   Listeria monocytogenes NOT DETECTED NOT DETECTED Final   Staphylococcus species NOT DETECTED NOT DETECTED Final   Staphylococcus aureus (BCID) NOT DETECTED NOT DETECTED Final   Staphylococcus epidermidis NOT DETECTED NOT DETECTED Final   Staphylococcus lugdunensis NOT DETECTED NOT DETECTED Final   Streptococcus species NOT DETECTED NOT DETECTED Final   Streptococcus agalactiae NOT DETECTED NOT DETECTED Final   Streptococcus pneumoniae NOT DETECTED NOT DETECTED Final   Streptococcus pyogenes NOT DETECTED NOT DETECTED Final   A.calcoaceticus-baumannii NOT DETECTED NOT DETECTED Final   Bacteroides fragilis NOT DETECTED NOT  DETECTED Final   Enterobacterales NOT DETECTED NOT DETECTED Final   Enterobacter cloacae complex NOT DETECTED NOT DETECTED Final   Escherichia coli NOT DETECTED NOT DETECTED Final   Klebsiella aerogenes NOT DETECTED NOT DETECTED Final   Klebsiella oxytoca NOT DETECTED NOT DETECTED Final   Klebsiella pneumoniae NOT DETECTED NOT DETECTED Final   Proteus species NOT DETECTED NOT DETECTED Final   Salmonella species NOT DETECTED NOT DETECTED Final   Serratia marcescens NOT DETECTED NOT DETECTED Final   Haemophilus influenzae NOT DETECTED NOT DETECTED Final   Neisseria meningitidis NOT DETECTED NOT DETECTED Final   Pseudomonas aeruginosa NOT DETECTED NOT DETECTED Final   Stenotrophomonas maltophilia NOT DETECTED NOT DETECTED Final   Candida albicans NOT DETECTED NOT DETECTED Final   Candida auris NOT DETECTED NOT DETECTED Final   Candida glabrata NOT DETECTED NOT DETECTED Final   Candida krusei NOT DETECTED NOT DETECTED Final   Candida parapsilosis NOT DETECTED NOT DETECTED Final   Candida tropicalis NOT DETECTED NOT DETECTED Final   Cryptococcus neoformans/gattii NOT DETECTED NOT DETECTED Final    Comment: Performed at [facility], [address] [place], [place] [address]  Culture, blood (Routine X 2) w Reflex to ID Panel     Status: None (Preliminary result)   Collection Time: 04/02/22 11:53 AM  Specimen: BLOOD  Result Value Ref Range Status   Specimen Description BLOOD RIGHT ANTECUBITAL  Final   Special Requests   Final    BOTTLES DRAWN AEROBIC ONLY Blood Culture adequate volume   Culture   Final    NO GROWTH < 24 HOURS Performed at [facility], [address], [place], [place] [address]    Report Status PENDING  Incomplete  Culture, blood (Routine X 2) w Reflex to ID Panel     Status: None (Preliminary result)   Collection Time: 04/02/22 11:58 AM   Specimen: BLOOD  Result Value Ref Range Status   Specimen Description BLOOD LEFT ANTECUBITAL  Final   Special  Requests   Final    BOTTLES DRAWN AEROBIC AND ANAEROBIC Blood Culture adequate volume   Culture   Final    NO GROWTH < 24 HOURS Performed at [facility], [place] [address], [place], [place] [address]    Report Status PENDING  Incomplete    RADIOLOGY STUDIES/RESULTS: No results found.   LOS: 7 days   Oren Binet, MD  [facility]    To contact the attending provider between 7A-7P or the covering provider during after hours 7P-7A, please log into the web site [email or web address] and access using universal [place] password for that web site. If you do not have the password, please call the hospital operator.  04/03/2022, 11:12 AM

## 2022-04-03 NOTE — Plan of Care (Signed)
  Problem: Education: Goal: Ability to describe self-care measures that may prevent or decrease complications (Diabetes Survival Skills Education) will improve Outcome: Progressing Goal: Individualized Educational Video(s) Outcome: Progressing   Problem: Coping: Goal: Ability to adjust to condition or change in health will improve Outcome: Progressing   Problem: Fluid Volume: Goal: Ability to maintain a balanced intake and output will improve Outcome: Progressing   Problem: Health Behavior/Discharge Planning: Goal: Ability to identify and utilize available resources and services will improve Outcome: Progressing Goal: Ability to manage health-related needs will improve Outcome: Progressing   Problem: Metabolic: Goal: Ability to maintain appropriate glucose levels will improve Outcome: Progressing   Problem: Nutritional: Goal: Maintenance of adequate nutrition will improve Outcome: Progressing Goal: Progress toward achieving an optimal weight will improve Outcome: Progressing   Problem: Skin Integrity: Goal: Risk for impaired skin integrity will decrease Outcome: Progressing   Problem: Tissue Perfusion: Goal: Adequacy of tissue perfusion will improve Outcome: Progressing   Problem: Education: Goal: Knowledge of General Education information will improve Description: Including pain rating scale, medication(s)/side effects and non-pharmacologic comfort measures Outcome: Progressing   Problem: Health Behavior/Discharge Planning: Goal: Ability to manage health-related needs will improve Outcome: Progressing   Problem: Clinical Measurements: Goal: Ability to maintain clinical measurements within normal limits will improve Outcome: Progressing Goal: Will remain free from infection Outcome: Progressing Goal: Diagnostic test results will improve Outcome: Progressing Goal: Respiratory complications will improve Outcome: Progressing Goal: Cardiovascular complication will  be avoided Outcome: Progressing   Problem: Activity: Goal: Risk for activity intolerance will decrease Outcome: Progressing   Problem: Nutrition: Goal: Adequate nutrition will be maintained Outcome: Progressing   Problem: Coping: Goal: Level of anxiety will decrease Outcome: Progressing   Problem: Elimination: Goal: Will not experience complications related to bowel motility Outcome: Progressing Goal: Will not experience complications related to urinary retention Outcome: Progressing   Problem: Pain Managment: Goal: General experience of comfort will improve Outcome: Progressing   Problem: Safety: Goal: Ability to remain free from injury will improve Outcome: Progressing   Problem: Skin Integrity: Goal: Risk for impaired skin integrity will decrease Outcome: Progressing   Problem: Fluid Volume: Goal: Hemodynamic stability will improve Outcome: Progressing   Problem: Clinical Measurements: Goal: Diagnostic test results will improve Outcome: Progressing Goal: Signs and symptoms of infection will decrease Outcome: Progressing   Problem: Respiratory: Goal: Ability to maintain adequate ventilation will improve Outcome: Progressing

## 2022-04-03 NOTE — Progress Notes (Signed)
[facility] for Infectious Disease  Date of Admission:  03/27/2022   Total days of inpatient antibiotics 7  Principal Problem:   Hypernatremia Active Problems:   DM2 (diabetes mellitus, type 2) (HCC)   Acute renal failure superimposed on stage 3b chronic kidney disease (HCC)   Severe sepsis (HCC)   Acute cystitis   Acute metabolic encephalopathy   Acute on chronic urinary retention   NSTEMI (non-ST elevated myocardial infarction) (HCC)   Hypercalcemia   Chronic diastolic CHF (congestive heart failure) (HCC)   Pressure injury of skin          Assessment: 88 year old male presented with confusion from home  admitted for hypernatremia.  Found to have Aerococcus urinae UTI and bacteremia. #Aerococcus urinae bacteremia 2/2 Aerococcus UTI -TTE showed no vegetation. TEE not needed as we have a source/endocarditis is not likely at this point. - Patient started on on ceftriaxone 1 g/day -Plan on 2 weeks of ceftriaxone from 2gm/day dosing Recommendations: -Conitnue ceftriaxone 2gm /day x 2 weeks for bacteremia/ EOT 7/18 -Repeat Cx+ GPC, suspect contamination. Follow speciation. -Repeat blood Cx      #RPR 1:2 #Concern for dementia vs acute encephalopathy #Admitted for hypernatremia-resolved -Of note during 06/2020 hospitalization at [facility] there was concern for dementia vs uremia.  -Called [facility] and this is the first incidence of positive RPR, no Hx of treatment. -Needs LP per + RPR and unclear source of encephalopathy. Although with such a low RPR titer, neurosyphillis is low on the differential.  LP was unsuccessful. Plan to retry tomorrow -Start PEN IV after LP. Please obtain CSF Cx, VDRL, cell count, PTN and GLC.    Microbiology:   Antibiotics: 6/30 ceftrixone -p Vancomcyin 6/30   Cultures: Blood 6/301/2 Aerococcus 7/4 GPC Urine 6/30 Aerococcus  SUBJECTIVE: Resting in bed. More alert today.  Review of Systems: Review of Systems  All other systems  reviewed and are negative.    Scheduled Meds:  aspirin  81 mg Oral Daily   insulin aspart  0-9 Units Subcutaneous TID AC & HS   lidocaine (cardiac) 100 mg/24m       metoprolol tartrate  25 mg Oral BID   tamsulosin  0.4 mg Oral Daily   Continuous Infusions:  cefTRIAXone (ROCEPHIN)  IV 2 g (04/03/22 1759)   PRN Meds:.acetaminophen **OR** acetaminophen, lidocaine (cardiac) 100 mg/580mNo Known Allergies  OBJECTIVE: Vitals:   04/03/22 0804 04/03/22 1147 04/03/22 1656 04/03/22 2003  BP: (!) 143/60 (!) 159/66 (!) 142/55 (!) 158/72  Pulse: 81 84 76   Resp: (!) '23 18 20 20  '$ Temp: 98 F (36.7 C) 98.2 F (36.8 C) 98.3 F (36.8 C) 99.3 F (37.4 C)  TempSrc: Axillary Axillary Oral Oral  SpO2: 99% 97%  97%  Weight:      Height:       Body mass index is 22.38 kg/m.  Physical Exam Constitutional:      General: He is not in acute distress.    Appearance: He is normal weight. He is not toxic-appearing.  HENT:     Head: Normocephalic and atraumatic.     Right Ear: External ear normal.     Left Ear: External ear normal.     Nose: No congestion or rhinorrhea.     Mouth/Throat:     Mouth: Mucous membranes are moist.     Pharynx: Oropharynx is clear.  Eyes:     Extraocular Movements: Extraocular movements intact.  Conjunctiva/sclera: Conjunctivae normal.     Pupils: Pupils are equal, round, and reactive to light.  Cardiovascular:     Rate and Rhythm: Normal rate and regular rhythm.     Heart sounds: No murmur heard.    No friction rub. No gallop.  Pulmonary:     Effort: Pulmonary effort is normal.     Breath sounds: Normal breath sounds.  Abdominal:     General: Abdomen is flat. Bowel sounds are normal.     Palpations: Abdomen is soft.  Musculoskeletal:        General: No swelling. Normal range of motion.     Cervical back: Normal range of motion and neck supple.  Skin:    General: Skin is warm and dry.  Neurological:     General: No focal deficit present.     Mental  Status: He is oriented to person, place, and time.  Psychiatric:        Mood and Affect: Mood normal.       Lab Results Lab Results  Component Value Date   WBC 11.1 (H) 04/01/2022   HGB 9.7 (L) 04/01/2022   HCT 30.0 (L) 04/01/2022   MCV 87.0 04/01/2022   PLT 137 (L) 04/01/2022    Lab Results  Component Value Date   CREATININE 1.55 (H) 04/02/2022   BUN 18 04/02/2022   NA 146 (H) 04/02/2022   K 3.8 04/02/2022   CL 111 04/02/2022   CO2 23 04/02/2022    Lab Results  Component Value Date   ALT 158 (H) 04/02/2022   AST 56 (H) 04/02/2022   ALKPHOS 51 04/02/2022   BILITOT 0.2 (L) 04/02/2022        Laurice Record, [place] 04/03/2022, 10:31 PM

## 2022-04-03 NOTE — Procedures (Signed)
Indication:  Risks of the procedure were dicussed with the patient's son including post-LP headache, bleeding, infection, weakness/numbness of legs(radiculopathy), death.  The patient/patient's proxy agreed and written consent was obtained.   The patient was prepped and draped, and using sterile technique a 20 gauge quinke spinal needle was inserted in the L4-5 and L3-4 space. Despite multiple attempts, no CSF was obtained.  Will need LP under [facility] guidance.  Primary team notified.  [facility] Pager Number [phone]

## 2022-04-03 NOTE — Progress Notes (Signed)
Occupational Therapy Treatment Patient Details Name: Sergio [name] MRN: [id number] DOB: [birth year] Today's Date: 04/03/2022   History of present illness Pt is a 88 y/o male presenting on 6/30 with AMS.  Found with acute hypernatremia, elevated troponin due to severe sepsis from UTI and dehydration, AKI. PMHx:  chronic urinary retention, CKD3b, DM, CHF, HTN, PE.   OT comments  Pt making incremental progress. This session, pt was able to follow approximately 50% of commands and attempted to participate in all tasks. He requires increased cuing for initiation with all tasks. Initially he required mod A +2 for bed mobility to come to sitting and max A +2 for sit<>stands x3 with RW. He was unable to clear his bottom more than 2 inches from the bed. He required Max A to return to supine, then max A +2 to sit up EOB again, this time on the L side of the bed. With transfer to the recliner, pt attempting to assist with squat pivot, continued to require max A +2, as well as use of bed pad. Continuing to recommend SNF to maximize independence and OT will continue to follow acutely.    Recommendations for follow up therapy are one component of a multi-disciplinary discharge planning process, led by the attending physician.  Recommendations may be updated based on patient status, additional functional criteria and insurance authorization.    Follow Up Recommendations  Skilled nursing-short term rehab (<3 hours/day)    Assistance Recommended at Discharge Frequent or constant Supervision/Assistance  Patient can return home with the following  Two people to help with walking and/or transfers;Two people to help with bathing/dressing/bathroom;Assistance with cooking/housework;Assistance with feeding;Direct supervision/assist for medications management;Direct supervision/assist for financial management;Assist for transportation;Help with stairs or ramp for entrance   Equipment Recommendations  Other (comment)  (TBD)    Recommendations for Other Services      Precautions / Restrictions Precautions Precautions: Fall Precaution Comments: confusion, LE wounds Restrictions Weight Bearing Restrictions: No       Mobility Bed Mobility Overal bed mobility: Needs Assistance Bed Mobility: Supine to Sit, Sit to Supine     Supine to sit: Mod assist, Max assist, +2 for physical assistance, +2 for safety/equipment Sit to supine: Max assist, Total assist   General bed mobility comments: Pt with poor initiation, able to assist some once iniated, however 2nd sit up, pt required max +2, most likely due to fatigue    Transfers Overall transfer level: Needs assistance Equipment used: Rolling walker (2 wheels), 2 person hand held assist Transfers: Sit to/from Stand, Bed to chair/wheelchair/BSC Sit to Stand: Max assist, +2 physical assistance, +2 safety/equipment   Squat pivot transfers: Max assist, +2 safety/equipment, +2 physical assistance       General transfer comment: Pt attempting to assist with stand and transfer, unable to clear his bottom more than 2 inches from the bed. Pt able to activate BLE a little more with squat pivot to chair.     Balance Overall balance assessment: Needs assistance Sitting-balance support: No upper extremity supported, Feet supported Sitting balance-Leahy Scale: Fair Sitting balance - Comments: min guard to supervision statically at EOB during ADLs   Standing balance support: Bilateral upper extremity supported, During functional activity Standing balance-Leahy Scale: Zero Standing balance comment: Unable to fully come to standing                           ADL either performed or assessed with clinical judgement   ADL  Overall ADL's : Needs assistance/impaired                                       General ADL Comments: Session focused on command following and transfers    Extremity/Trunk Assessment              Vision        Perception     Praxis      Cognition Arousal/Alertness: Lethargic Behavior During Therapy: Flat affect Overall Cognitive Status: No family/caregiver present to determine baseline cognitive functioning Area of Impairment: Orientation, Attention, Memory, Following commands, Safety/judgement, Awareness, Problem solving                 Orientation Level: Disoriented to, Place, Time, Situation Current Attention Level: Sustained Memory: Decreased short-term memory, Decreased recall of precautions Following Commands: Follows one step commands inconsistently, Follows one step commands with increased time Safety/Judgement: Decreased awareness of deficits, Decreased awareness of safety Awareness: Intellectual Problem Solving: Slow processing, Decreased initiation, Difficulty sequencing, Requires verbal cues, Requires tactile cues General Comments: Followed approximately 50% of commands this session, reports "I don" know" to most questions        Exercises      Shoulder Instructions       General Comments VSS on RA, RN in room at end of session to check on pt.    Pertinent Vitals/ Pain       Pain Assessment Pain Assessment: Faces Faces Pain Scale: Hurts little more Pain Location: B LES (R>L) with movement Pain Descriptors / Indicators: Discomfort, Grimacing, Guarding Pain Intervention(s): Monitored during session, Repositioned  Home Living                                          Prior Functioning/Environment              Frequency  Min 2X/week        Progress Toward Goals  OT Goals(current goals can now be found in the care plan section)  Progress towards OT goals: Progressing toward goals  Acute Rehab OT Goals Patient Stated Goal: None stated this session OT Goal Formulation: With patient Time For Goal Achievement: 04/14/22 Potential to Achieve Goals: Fair ADL Goals Pt Will Perform Grooming: with set-up;sitting Pt Will Perform  Upper Body Dressing: with set-up;sitting Pt Will Transfer to Toilet: with mod assist;with +2 assist;bedside commode;stand pivot transfer Additional ADL Goal #1: Pt will follow 1 step commands with increased time and 90% accuracy for ADL engagement. Additional ADL Goal #2: Pt will complete bed mobility with mod assist as precursor to ADLS.  Plan Discharge plan remains appropriate;Frequency remains appropriate    Co-evaluation    PT/OT/SLP Co-Evaluation/Treatment: Yes Reason for Co-Treatment: Complexity of the patient's impairments (multi-system involvement);For patient/therapist safety;Necessary to address cognition/behavior during functional activity;To address functional/ADL transfers   OT goals addressed during session: Strengthening/ROM;Other (comment) (transfers)      AM-PAC OT "6 Clicks" Daily Activity     Outcome Measure   Help from another person eating meals?: A Little Help from another person taking care of personal grooming?: A Little Help from another person toileting, which includes using toliet, bedpan, or urinal?: Total Help from another person bathing (including washing, rinsing, drying)?: A Lot Help from another person to put on and taking off regular upper  body clothing?: A Lot Help from another person to put on and taking off regular lower body clothing?: Total 6 Click Score: 12    End of Session Equipment Utilized During Treatment: Gait belt;Rolling walker (2 wheels)  OT Visit Diagnosis: Other abnormalities of gait and mobility (R26.89);Muscle weakness (generalized) (M62.81);Pain;Other symptoms and signs involving cognitive function Pain - Right/Left: Right Pain - part of body: Leg   Activity Tolerance Patient limited by fatigue   Patient Left in chair;with call bell/phone within reach;with chair alarm set   Nurse Communication Mobility status        Time: 1423-9532 OT Time Calculation (min): 34 min  Charges: OT General Charges $OT Visit: 1 Visit OT  Treatments $Self Care/Home Management : 8-22 mins  Elicia Lui H., OTR/L Acute Rehabilitation  Kimaya Whitlatch Elane Kirbi Farrugia 04/03/2022, 1:13 PM

## 2022-04-03 NOTE — Progress Notes (Signed)
Tele called. Pt had a 21 beat run of Sinus tach.  Pt sleeping at the time.  Pt stated, "I'm ok" after verbally waking pt up.  VS stable. Notified MD.  Will continue to monitor.

## 2022-04-04 ENCOUNTER — Inpatient Hospital Stay (HOSPITAL_COMMUNITY): Payer: PPO

## 2022-04-04 DIAGNOSIS — E1122 Type 2 diabetes mellitus with diabetic chronic kidney disease: Secondary | ICD-10-CM | POA: Diagnosis not present

## 2022-04-04 DIAGNOSIS — E870 Hyperosmolality and hypernatremia: Secondary | ICD-10-CM | POA: Diagnosis not present

## 2022-04-04 DIAGNOSIS — G9341 Metabolic encephalopathy: Secondary | ICD-10-CM | POA: Diagnosis not present

## 2022-04-04 DIAGNOSIS — N179 Acute kidney failure, unspecified: Secondary | ICD-10-CM | POA: Diagnosis not present

## 2022-04-04 LAB — COMPREHENSIVE METABOLIC PANEL
ALT: 82 U/L — ABNORMAL HIGH (ref 0–44)
AST: 38 U/L (ref 15–41)
Albumin: 1.6 g/dL — ABNORMAL LOW (ref 3.5–5.0)
Alkaline Phosphatase: 44 U/L (ref 38–126)
Anion gap: 7 (ref 5–15)
BUN: 19 mg/dL (ref 8–23)
CO2: 22 mmol/L (ref 22–32)
Calcium: 7.9 mg/dL — ABNORMAL LOW (ref 8.9–10.3)
Chloride: 114 mmol/L — ABNORMAL HIGH (ref 98–111)
Creatinine, Ser: 1.48 mg/dL — ABNORMAL HIGH (ref 0.61–1.24)
GFR, Estimated: 46 mL/min — ABNORMAL LOW (ref >60)
Glucose, Bld: 126 mg/dL — ABNORMAL HIGH (ref 70–99)
Potassium: 4.2 mmol/L (ref 3.5–5.1)
Sodium: 143 mmol/L (ref 135–145)
Total Bilirubin: 0.5 mg/dL (ref 0.3–1.2)
Total Protein: 6.1 g/dL — ABNORMAL LOW (ref 6.5–8.1)

## 2022-04-04 LAB — GLUCOSE, CAPILLARY
Glucose-Capillary: 164 mg/dL — ABNORMAL HIGH (ref 70–99)
Glucose-Capillary: 121 mg/dL — ABNORMAL HIGH (ref 70–99)
Glucose-Capillary: 115 mg/dL — ABNORMAL HIGH (ref 70–99)
Glucose-Capillary: 139 mg/dL — ABNORMAL HIGH (ref 70–99)
Glucose-Capillary: 188 mg/dL — ABNORMAL HIGH (ref 70–99)

## 2022-04-04 LAB — MRSA NEXT GEN BY PCR, NASAL: MRSA by PCR Next Gen: NOT DETECTED

## 2022-04-04 MED ORDER — LIP MEDEX EX OINT: TOPICAL_OINTMENT | CUTANEOUS | Status: DC | PRN

## 2022-04-04 MED ORDER — GERHARDT'S BUTT CREAM
TOPICAL_CREAM | Freq: Two times a day (BID) | CUTANEOUS | Status: DC
  Administered 2022-04-09: 1 via TOPICAL
  Filled 2022-04-04: qty 1

## 2022-04-04 MED ORDER — SODIUM CHLORIDE 0.9 % IV SOLN: INTRAVENOUS | Status: DC | PRN

## 2022-04-04 MED ORDER — VANCOMYCIN HCL IN DEXTROSE 1-5 GM/200ML-% IV SOLN
1000.0000 mg | INTRAVENOUS | Status: DC
  Administered 2022-04-04 – 2022-04-06 (×2): 1000 mg via INTRAVENOUS
  Filled 2022-04-04 (×4): qty 200

## 2022-04-04 MED ORDER — ORAL CARE MOUTH RINSE: 15.0000 mL | OROMUCOSAL | Status: DC | PRN

## 2022-04-04 MED ORDER — HYDROCERIN EX CREA
TOPICAL_CREAM | Freq: Two times a day (BID) | CUTANEOUS | Status: DC
  Filled 2022-04-04 (×2): qty 113

## 2022-04-04 NOTE — Progress Notes (Addendum)
Id brief note    Patient with 2 separate days of gpc bacteremia. 6/30 did show aerococcus species. Ucx aerococcus urinae  Incidental rpr 1:2.... ams with suspected baseline cognitive deficit and likely metabolic in setting bacteremia    A/p Continue ceftriaxone for now  However would get tee (tte unremarkable). To r/o endocarditis given 2 separate days with gpc bacteremia. Aerococcus urinae does require aminoglycoside addition to betalactam or vanc to treat if endocarditis  Would focus on clearance of bacteremia first (pending 7/8 bcx) prior to performing LP to r/o neurosyphilis  Add vanc now while 7/6 bcx finalize  Discuss with primary care team

## 2022-04-04 NOTE — Progress Notes (Addendum)
Pharmacy Antibiotic Note  Sergio [name] is a 88 y.o. male admitted on 03/27/2022 with bacteremia.  Pharmacy has been consulted for Vancomycin dosing.  Plan: Start Vancomycin 1000 IV every 36 hours.  Goal AUC 400-550 mcg*h/mL Estimated AUC of 519 mcg*h/mL Estimated t 1/2 of 23.3 hours  Scr of 1.48 utilized  De-escalate therapy as able, will continue to monitor renal function and micro results as able and will adjust dose as needed.   Height: '5\' 5"'$  (165.1 cm) Weight: 59.9 kg (132 lb 0.9 oz) IBW/kg (Calculated) : 61.5  Temp (24hrs), Avg:98.3 F (36.8 C), Min:97.8 F (36.6 C), Max:99.3 F (37.4 C)  Recent Labs  Lab 03/29/22 0212 03/30/22 0022 03/31/22 0125 04/01/22 0104 04/02/22 0138 04/04/22 0447  WBC 17.2* 15.0* 11.6* 11.1*  --   --   CREATININE 2.28* 2.10* 1.60* 1.51* 1.55* 1.48*    Estimated Creatinine Clearance: 29.8 mL/min (A) (by C-G formula based on SCr of 1.48 mg/dL (H)).    No Known Allergies  Antimicrobials this admission: Vancomycin 6/30 x 1; 7/8>>  Ceftriaxone 6/30 >> 7/17   Microbiology results: 7/6 BCx: gram + cocci in clusters 3/4, pending 7/8 BCx: in process 6/30 UCx: 60k proteus; >100k aerococcus   Thank you for allowing pharmacy to be a part of this patient's care.  Vicenta Dunning, PharmD  PGY1 Pharmacy Resident    _______________________________________________________________ I discussed / reviewed the pharmacy note by Dr. Kara Mead and I agree with the resident's findings and plans as documented.  Vaughan Basta BS, PharmD, BCPS Clinical Pharmacist 04/04/2022 12:53 PM  Contact: [phone] after 3 PM  "Be curious, not judgmental..." -Jamal Maes _______________________________________________________________

## 2022-04-04 NOTE — Progress Notes (Signed)
PROGRESS NOTE        PATIENT DETAILS Name: Sergio [name] Age: 88 y.o. Sex: male Date of Birth: 06-06-1933 Admit Date: 03/27/2022 Admitting Physician Rhetta Mura, DO MVE:[id number], Arlie Solomons, MD  Brief Summary: Patient is a 88 y.o.  male with history of DM-2, HTN, CKD stage IIIb, chronic HFpEF, suspected dementia/cognitive dysfunction at baseline-presented with confusion-found to have severe hyponatremia.  Significant events: 6/30>> AMS-hypernatremia-admit to [facility]  Significant studies: 6/30>> TSH: 4.8 (minimally elevated)-FT4: Normal 6/30>> NH 4: 53 6/30>> CXR: No PNA 6/30>> CT head: No acute findings 6/30>> CT abdomen/pelvis: No hydronephrosis-cholelithiasis without inflammatory change-enlarged prostate. 7/01>> Echo: EF 65-70%, no regional wall motion abnormality. 7/04>> acute hepatitis serology: Negative 7/06>> vitamin B12: 662 7/06>> RPR: Reactive 7/06>> T. pallidum AB: Reactive  Significant microbiology data: 6/30>> blood culture: Aerococcus urinae in 1/2  6/30>> urine culture: 60,000 colonies of Proteus/100,000 colonies of Aerococcus 6/30>> COVID/influenza PCR: Negative  Procedures:   Consults: ID  Subjective: Lying comfortably-no headache-answering questions appropriately.  Objective: Vitals: Blood pressure (!) 141/59, pulse 68, temperature 97.8 F (36.6 C), temperature source Oral, resp. rate 18, height '5\' 5"'$  (1.651 m), weight 59.9 kg, SpO2 97 %.   Exam: Gen Exam: Pleasantly confused-not in any distress HEENT:atraumatic, normocephalic Chest: B/L clear to auscultation anteriorly CVS:S1S2 regular Abdomen:soft non tender, non distended Extremities:no edema Neurology: Non focal Skin: no rash   Pertinent Labs/Radiology:    Latest Ref Rng & Units 04/01/2022    1:04 AM 03/31/2022    1:25 AM 03/30/2022   12:22 AM  CBC  WBC 4.0 - 10.5 K/uL 11.1  11.6  15.0   Hemoglobin 13.0 - 17.0 g/dL 9.7  9.1  10.2   Hematocrit 39.0 - 52.0 % 30.0   28.1  32.1   Platelets 150 - 400 K/uL 137  115  109     Lab Results  Component Value Date   NA 143 04/04/2022   K 4.2 04/04/2022   CL 114 (H) 04/04/2022   CO2 22 04/04/2022      Assessment/Plan: Acute metabolic encephalopathy: Due to a combination of AKI/hypernatremia-suspect may have some amount of cognitive dysfunction at baseline (see below).   Hypernatremia: Improved-due to dehydration/poor oral intake.  Continue to encourage oral intake-follow electrolytes.  AKI on CKD stage IIIb: Hemodynamically mediated-improving with supportive care.  Non-STEMI: Given advanced dementia/AKI-managed medically-no wall motion abnormality on echo-EF preserved-continue aspirin//beta-blocker-start statin once CK/LFTs improve further.  Discussed with cardiologist-Dr. Theador Hawthorne on 7/6-who reviewed his chart-recommendations for outpatient nuclear stress test once he has recovered from his acute illness.  Severe sepsis due to Aerococcus bacteremia/UTI: Febrile to 101 on initial presentation-sepsis physiology has resolved-appreciate ID input-recommendations Rocephin x2 weeks-end date 7/18.  Proteus UTI: Suspect this is a contaminant-however already on Rocephin for above.  Transaminitis: Probably multifactorial in the setting of myolysis/sepsis-repeat liver enzymes periodically.  Acute hepatitis serology negative.  Rhabdomyolysis: Due to likely being down-CK levels downtrending-follow periodically.  Normocytic anemia: Due to acute illness-no evidence of blood loss  HTN: BP well controlled-continue metoprolol-follow/optimize.   DM-2 (A1c 6.3 on 6/30): CBG stable with SSI  Recent Labs    04/03/22 1149 04/03/22 1657 04/04/22 0829  GLUCAP 233* 144* 121*     BPH: Per nursing staff-never required Foley catheter insertion this admission-on Flomax-monitor with periodic bladder scans.  Reactive RPR titer-with positive treponema antibody: Given cognitive issues-concern for  neurosyphilis-unfortunately-LP  not successful at bedside by neurology-have consulted radiology to see if LP can be done via fluoroscopy. Per patient's son who this MD talked to on 7/7-patient has had cognitive issues for the past several months/few years.  ?  Chronic cognitive dysfunction: See above  Pressure Ulcer: Pressure Injury 03/28/22 Hip Left;Lateral Stage 2 -  Partial thickness loss of dermis presenting as a shallow open injury with a red, pink wound bed without slough. 2 L hip wounds (Active)  03/28/22 1400  Location: Hip  Location Orientation: Left;Lateral  Staging: Stage 2 -  Partial thickness loss of dermis presenting as a shallow open injury with a red, pink wound bed without slough.  Wound Description (Comments): 2 L hip wounds  Present on Admission: Yes  Dressing Type Foam - Lift dressing to assess site every shift 04/03/22 2003    BMI Estimated body mass index is 21.98 kg/m as calculated from the following:   Height as of this encounter: '5\' 5"'$  (1.651 m).   Weight as of this encounter: 59.9 kg.   Code status:   Code Status: Full Code   DVT Prophylaxis: SCDs Start: 03/27/22 2044    Family Communication: Son-[name]- on 7/7   Disposition Plan: Status is: Inpatient Remains inpatient appropriate because: Bacteremia/hyponatremia-resolving encephalopathy-noted stable for discharge.   Planned Discharge Destination:Skilled nursing facility   Diet: Diet Order             Diet regular Room service appropriate? No; Fluid consistency: Thin  Diet effective now                     Antimicrobial agents: Anti-infectives (From admission, onward)    Start     Dose/Rate Route Frequency Ordered Stop   04/01/22 1800  cefTRIAXone (ROCEPHIN) 2 g in sodium chloride 0.9 % 100 mL IVPB        2 g 200 mL/hr over 30 Minutes Intravenous Every 24 hours 04/01/22 1205 04/14/22 2359   03/27/22 1715  cefTRIAXone (ROCEPHIN) 1 g in sodium chloride 0.9 % 100 mL IVPB  Status:  Discontinued        1  g 200 mL/hr over 30 Minutes Intravenous Every 24 hours 03/27/22 1715 04/01/22 1205   03/27/22 1715  vancomycin (VANCOREADY) IVPB 1250 mg/250 mL        1,250 mg 166.7 mL/hr over 90 Minutes Intravenous  Once 03/27/22 1715 03/27/22 2048        MEDICATIONS: Scheduled Meds:  aspirin  81 mg Oral Daily   Gerhardt's butt cream   Topical BID   hydrocerin   Topical BID   insulin aspart  0-9 Units Subcutaneous TID AC & HS   metoprolol tartrate  25 mg Oral BID   tamsulosin  0.4 mg Oral Daily   Continuous Infusions:  cefTRIAXone (ROCEPHIN)  IV 2 g (04/03/22 1759)   PRN Meds:.acetaminophen **OR** acetaminophen, lip balm, mouth rinse   I have personally reviewed following labs and imaging studies  LABORATORY DATA: CBC: Recent Labs  Lab 03/29/22 0212 03/30/22 0022 03/31/22 0125 04/01/22 0104  WBC 17.2* 15.0* 11.6* 11.1*  HGB 11.0* 10.2* 9.1* 9.7*  HCT 35.6* 32.1* 28.1* 30.0*  MCV 89.0 88.9 86.5 87.0  PLT 124* 109* 115* 137*     Basic Metabolic Panel: Recent Labs  Lab 03/30/22 0022 03/31/22 0125 04/01/22 0104 04/02/22 0138 04/04/22 0447  NA 149* 144 146* 146* 143  K 4.2 3.3* 3.7 3.8 4.2  CL 115* 114* 119* 111 114*  CO2  $'24 24 23 23 22  'C$ GLUCOSE 179* 163* 81 120* 126*  BUN 41* 27* '21 18 19  '$ CREATININE 2.10* 1.60* 1.51* 1.55* 1.48*  CALCIUM 8.2* 7.7* 7.9* 7.9* 7.9*     GFR: Estimated Creatinine Clearance: 29.8 mL/min (A) (by C-G formula based on SCr of 1.48 mg/dL (H)).  Liver Function Tests: Recent Labs  Lab 03/29/22 0212 03/30/22 0022 03/31/22 0125 04/02/22 0138 04/04/22 0447  AST 172* 449* 189* 56* 38  ALT 136* 474* 334* 158* 82*  ALKPHOS 43 56 49 51 44  BILITOT 1.0 0.5 0.4 0.2* 0.5  PROT 6.1* 6.0* 5.5* 5.9* 6.1*  ALBUMIN 1.8* 1.7* 1.6* 1.7* 1.6*    No results for input(s): "LIPASE", "AMYLASE" in the last 168 hours. No results for input(s): "AMMONIA" in the last 168 hours.   Coagulation Profile: Recent Labs  Lab 04/03/22 1153  INR 1.2      Cardiac Enzymes: Recent Labs  Lab 03/29/22 0212 04/02/22 0138  CKTOTAL 756* 111     BNP (last 3 results) No results for input(s): "PROBNP" in the last 8760 hours.  Lipid Profile: No results for input(s): "CHOL", "HDL", "LDLCALC", "TRIG", "CHOLHDL", "LDLDIRECT" in the last 72 hours.  Thyroid Function Tests: No results for input(s): "TSH", "T4TOTAL", "FREET4", "T3FREE", "THYROIDAB" in the last 72 hours.  Anemia Panel: Recent Labs    04/02/22 0138  VITAMINB12 662     Urine analysis:    Component Value Date/Time   COLORURINE AMBER (A) 03/27/2022 2030   APPEARANCEUR CLOUDY (A) 03/27/2022 2030   LABSPEC 1.013 03/27/2022 2030   PHURINE 8.0 03/27/2022 2030   GLUCOSEU NEGATIVE 03/27/2022 2030   HGBUR MODERATE (A) 03/27/2022 2030   BILIRUBINUR NEGATIVE 03/27/2022 2030   BILIRUBINUR negative 10/01/2017 0901   BILIRUBINUR neg 04/02/2014 1605   KETONESUR NEGATIVE 03/27/2022 2030   PROTEINUR 100 (A) 03/27/2022 2030   UROBILINOGEN 0.2 10/01/2017 0901   UROBILINOGEN 0.2 02/12/2015 2205   NITRITE NEGATIVE 03/27/2022 2030   LEUKOCYTESUR SMALL (A) 03/27/2022 2030    Sepsis Labs: Lactic Acid, Venous    Component Value Date/Time   LATICACIDVEN 1.9 03/28/2022 0607    MICROBIOLOGY: Recent Results (from the past 240 hour(s))  Resp Panel by RT-PCR (Flu A&B, Covid) Anterior Nasal Swab     Status: None   Collection Time: 03/27/22  5:13 PM   Specimen: Anterior Nasal Swab  Result Value Ref Range Status   SARS Coronavirus 2 by RT PCR NEGATIVE NEGATIVE Final    Comment: (NOTE) SARS-CoV-2 target nucleic acids are NOT DETECTED.  The SARS-CoV-2 RNA is generally detectable in upper respiratory specimens during the acute phase of infection. The lowest concentration of SARS-CoV-2 viral copies this assay can detect is 138 copies/mL. A negative result does not preclude SARS-Cov-2 infection and should not be used as the sole basis for treatment or other patient management  decisions. A negative result may occur with  improper specimen collection/handling, submission of specimen other than nasopharyngeal swab, presence of viral mutation(s) within the areas targeted by this assay, and inadequate number of viral copies(<138 copies/mL). A negative result must be combined with clinical observations, patient history, and epidemiological information. The expected result is Negative.  Fact Sheet for Patients:  EntrepreneurPulse.com.au  Fact Sheet for Healthcare Providers:  IncredibleEmployment.be  This test is no t yet approved or cleared by the [place] FDA and  has been authorized for detection and/or diagnosis of SARS-CoV-2 by FDA under an Emergency Use Authorization (EUA). This EUA will remain  in  effect (meaning this test can be used) for the duration of the COVID-19 declaration under Section 564(b)(1) of the Act, 21 U.S.C.section 360bbb-3(b)(1), unless the authorization is terminated  or revoked sooner.       Influenza A by PCR NEGATIVE NEGATIVE Final   Influenza B by PCR NEGATIVE NEGATIVE Final    Comment: (NOTE) The Xpert Xpress SARS-CoV-2/FLU/RSV plus assay is intended as an aid in the diagnosis of influenza from Nasopharyngeal swab specimens and should not be used as a sole basis for treatment. Nasal washings and aspirates are unacceptable for Xpert Xpress SARS-CoV-2/FLU/RSV testing.  Fact Sheet for Patients: EntrepreneurPulse.com.au  Fact Sheet for Healthcare Providers: IncredibleEmployment.be  This test is not yet approved or cleared by the [place] FDA and has been authorized for detection and/or diagnosis of SARS-CoV-2 by FDA under an Emergency Use Authorization (EUA). This EUA will remain in effect (meaning this test can be used) for the duration of the COVID-19 declaration under Section 564(b)(1) of the Act, 21 U.S.C. section 360bbb-3(b)(1), unless the  authorization is terminated or revoked.  Performed at [facility], [place] [address], [place], [place] [address]   Urine Culture     Status: Abnormal   Collection Time: 03/27/22  5:13 PM   Specimen: In/Out Cath Urine  Result Value Ref Range Status   Specimen Description IN/OUT CATH URINE  Final   Special Requests NONE  Final   Culture (A)  Final    60,000 COLONIES/mL PROTEUS MIRABILIS >=100,000 COLONIES/mL AEROCOCCUS SPECIES Standardized susceptibility testing for this organism is not available. Performed at [facility], [place] [address], [place], [place] [address]    Report Status 03/30/2022 FINAL  Final   Organism ID, Bacteria PROTEUS MIRABILIS (A)  Final      Susceptibility   Proteus mirabilis - MIC*    AMPICILLIN <=2 SENSITIVE Sensitive     CEFAZOLIN <=4 SENSITIVE Sensitive     CEFEPIME <=0.12 SENSITIVE Sensitive     CEFTRIAXONE <=0.25 SENSITIVE Sensitive     CIPROFLOXACIN <=0.25 SENSITIVE Sensitive     GENTAMICIN <=1 SENSITIVE Sensitive     IMIPENEM 2 SENSITIVE Sensitive     NITROFURANTOIN 128 RESISTANT Resistant     TRIMETH/SULFA <=20 SENSITIVE Sensitive     AMPICILLIN/SULBACTAM <=2 SENSITIVE Sensitive     PIP/TAZO <=4 SENSITIVE Sensitive     * 60,000 COLONIES/mL PROTEUS MIRABILIS  Blood Culture (routine x 2)     Status: None   Collection Time: 03/27/22  5:37 PM   Specimen: BLOOD  Result Value Ref Range Status   Specimen Description BLOOD RIGHT ANTECUBITAL  Final   Special Requests Blood Culture adequate volume  Final   Culture   Final    NO GROWTH 5 DAYS Performed at [facility], [place] [address] [place], [place] [address]    Report Status 04/01/2022 FINAL  Final  Blood Culture (routine x 2)     Status: Abnormal   Collection Time: 03/27/22  6:03 PM   Specimen: BLOOD  Result Value Ref Range Status   Specimen Description BLOOD SITE NOT SPECIFIED  Final   Special Requests   Final    BOTTLES DRAWN AEROBIC AND ANAEROBIC Blood Culture adequate volume    Culture  Setup Time   Final    GRAM POSITIVE COCCI IN CLUSTERS IN BOTH AEROBIC AND ANAEROBIC BOTTLES CRITICAL RESULT CALLED TO, READ BACK BY AND VERIFIED WITH: PHARMD G.BARR AT 3419 ON 03/29/2022 BY T.SAAD.    Culture (A)  Final    AEROCOCCUS URINAE Standardized susceptibility testing for this organism is not available. Performed at [facility], [place] [address] [place], [place] [address]    Report Status 04/01/2022 FINAL  Final  Blood Culture ID Panel (Reflexed)     Status: None   Collection Time: 03/27/22  6:03 PM  Result Value Ref Range Status   Enterococcus faecalis NOT DETECTED NOT DETECTED Final   Enterococcus Faecium NOT DETECTED NOT DETECTED Final   Listeria monocytogenes NOT DETECTED NOT DETECTED Final   Staphylococcus species NOT DETECTED NOT DETECTED Final   Staphylococcus aureus (BCID) NOT DETECTED NOT DETECTED Final   Staphylococcus epidermidis NOT DETECTED NOT DETECTED Final   Staphylococcus lugdunensis NOT DETECTED NOT DETECTED Final   Streptococcus species NOT DETECTED NOT DETECTED Final   Streptococcus agalactiae NOT DETECTED NOT DETECTED Final   Streptococcus pneumoniae NOT DETECTED NOT DETECTED Final   Streptococcus pyogenes NOT DETECTED NOT DETECTED Final   A.calcoaceticus-baumannii NOT DETECTED NOT DETECTED Final   Bacteroides fragilis NOT DETECTED NOT DETECTED Final   Enterobacterales NOT DETECTED NOT DETECTED Final   Enterobacter cloacae complex NOT DETECTED NOT DETECTED Final   Escherichia coli NOT DETECTED NOT DETECTED Final   Klebsiella aerogenes NOT DETECTED NOT DETECTED Final   Klebsiella oxytoca NOT DETECTED NOT DETECTED Final   Klebsiella pneumoniae NOT DETECTED NOT DETECTED Final   Proteus species NOT DETECTED NOT DETECTED Final   Salmonella species NOT DETECTED NOT DETECTED Final   Serratia marcescens NOT DETECTED NOT DETECTED Final   Haemophilus influenzae NOT DETECTED NOT DETECTED Final   Neisseria meningitidis NOT DETECTED NOT DETECTED  Final   Pseudomonas aeruginosa NOT DETECTED NOT DETECTED Final   Stenotrophomonas maltophilia NOT DETECTED NOT DETECTED Final   Candida albicans NOT DETECTED NOT DETECTED Final   Candida auris NOT DETECTED NOT DETECTED Final   Candida glabrata NOT DETECTED NOT DETECTED Final   Candida krusei NOT DETECTED NOT DETECTED Final   Candida parapsilosis NOT DETECTED NOT DETECTED Final   Candida tropicalis NOT DETECTED NOT DETECTED Final   Cryptococcus neoformans/gattii NOT DETECTED NOT DETECTED Final    Comment: Performed at [facility], [address] [place], [place] [address]  Culture, blood (Routine X 2) w Reflex to ID Panel     Status: None (Preliminary result)   Collection Time: 04/02/22 11:53 AM   Specimen: BLOOD  Result Value Ref Range Status   Specimen Description BLOOD RIGHT ANTECUBITAL  Final   Special Requests   Final    IN BOTH AEROBIC AND ANAEROBIC BOTTLES Blood Culture adequate volume   Culture  Setup Time   Final    GRAM POSITIVE COCCI IN CLUSTERS IN BOTH AEROBIC AND ANAEROBIC BOTTLES CRITICAL RESULT CALLED TO, READ BACK BY AND VERIFIED WITH: [place]. [id number] FCP Performed at [facility], [place] [address] [place], [place] [address]    Culture GRAM POSITIVE COCCI  Final   Report Status PENDING  Incomplete  Culture, blood (Routine X 2) w Reflex to ID Panel     Status: None (Preliminary result)   Collection Time: 04/02/22 11:58 AM   Specimen: BLOOD  Result Value Ref Range Status   Specimen Description BLOOD LEFT ANTECUBITAL  Final   Special Requests   Final    BOTTLES DRAWN AEROBIC AND ANAEROBIC Blood Culture adequate volume   Culture  Setup Time   Final    GRAM POSITIVE COCCI IN PAIRS AEROBIC BOTTLE ONLY CRITICAL VALUE NOTED.  VALUE  IS CONSISTENT WITH PREVIOUSLY REPORTED AND CALLED VALUE. Performed at [facility], [place] [address] [place], [place] [address]    Culture [facility] POSITIVE COCCI  Final   Report Status PENDING  Incomplete     RADIOLOGY STUDIES/RESULTS: No results found.   LOS: 8 days   Oren Binet, MD  [facility]    To contact the attending provider between 7A-7P or the covering provider during after hours 7P-7A, please log into the web site [email or web address] and access using universal [place] password for that web site. If you do not have the password, please call the hospital operator.  04/04/2022, 11:09 AM

## 2022-04-05 DIAGNOSIS — E870 Hyperosmolality and hypernatremia: Secondary | ICD-10-CM | POA: Diagnosis not present

## 2022-04-05 DIAGNOSIS — N179 Acute kidney failure, unspecified: Secondary | ICD-10-CM | POA: Diagnosis not present

## 2022-04-05 DIAGNOSIS — G9341 Metabolic encephalopathy: Secondary | ICD-10-CM | POA: Diagnosis not present

## 2022-04-05 DIAGNOSIS — E1122 Type 2 diabetes mellitus with diabetic chronic kidney disease: Secondary | ICD-10-CM | POA: Diagnosis not present

## 2022-04-05 LAB — GLUCOSE, CAPILLARY
Glucose-Capillary: 86 mg/dL (ref 70–99)
Glucose-Capillary: 90 mg/dL (ref 70–99)
Glucose-Capillary: 132 mg/dL — ABNORMAL HIGH (ref 70–99)
Glucose-Capillary: 166 mg/dL — ABNORMAL HIGH (ref 70–99)

## 2022-04-05 NOTE — Progress Notes (Signed)
PROGRESS NOTE        PATIENT DETAILS Name: Sergio [name] Age: 88 y.o. Sex: male Date of Birth: [birth year] Admit Date: 03/27/2022 Admitting Physician Rhetta Mura, DO WHQ:[id number], Arlie Solomons, MD  Brief Summary: Patient is a 88 y.o.  male with history of DM-2, HTN, CKD stage IIIb, chronic HFpEF, suspected dementia/cognitive dysfunction at baseline-presented with confusion-found to have severe hyponatremia.  Significant events: 6/30>> AMS-hypernatremia-admit to [facility]  Significant studies: 6/30>> TSH: 4.8 (minimally elevated)-FT4: Normal 6/30>> NH 4: 53 6/30>> CXR: No PNA 6/30>> CT head: No acute findings 6/30>> CT abdomen/pelvis: No hydronephrosis-cholelithiasis without inflammatory change-enlarged prostate. 7/01>> Echo: EF 65-70%, no regional wall motion abnormality. 7/04>> acute hepatitis serology: Negative 7/06>> vitamin B12: 662 7/06>> RPR: Reactive 7/06>> T. pallidum AB: Reactive  Significant microbiology data: 6/30>> blood culture: Aerococcus urinae in 1/2  6/30>> urine culture: 60,000 colonies of Proteus/100,000 colonies of Aerococcus 6/30>> COVID/influenza PCR: Negative 7/6>> blood culture: Gram-positive cocci in clusters 7/8>> blood culture: No growth   Procedures:   Consults: ID  Subjective: Remains pleasantly confused.  No other issues overnight.  Objective: Vitals: Blood pressure (!) 116/52, pulse (!) 59, temperature 99 F (37.2 C), temperature source Oral, resp. rate 20, height '5\' 5"'$  (1.651 m), weight 57.9 kg, SpO2 97 %.   Exam: Gen Exam: Pleasantly confused-not in any distress. HEENT:atraumatic, normocephalic Chest: B/L clear to auscultation anteriorly CVS:S1S2 regular Abdomen:soft non tender, non distended Extremities:no edema Neurology: Non focal Skin: no rash   Pertinent Labs/Radiology:    Latest Ref Rng & Units 04/01/2022    1:04 AM 03/31/2022    1:25 AM 03/30/2022   12:22 AM  CBC  WBC 4.0 - 10.5 K/uL 11.1  11.6   15.0   Hemoglobin 13.0 - 17.0 g/dL 9.7  9.1  10.2   Hematocrit 39.0 - 52.0 % 30.0  28.1  32.1   Platelets 150 - 400 K/uL 137  115  109     Lab Results  Component Value Date   NA 143 04/04/2022   K 4.2 04/04/2022   CL 114 (H) 04/04/2022   CO2 22 04/04/2022      Assessment/Plan: Acute metabolic encephalopathy: Due to a combination of AKI/hypernatremia-suspect may have some amount of cognitive dysfunction at baseline (see below).   Hypernatremia: Improved-due to dehydration/poor oral intake.  Continue to encourage oral intake-follow electrolytes.  AKI on CKD stage IIIb: Hemodynamically mediated-improving with supportive care.  Non-STEMI: Given advanced dementia/AKI-managed medically-no wall motion abnormality on echo-EF preserved-continue aspirin//beta-blocker-start statin once CK/LFTs improve further.  Discussed with cardiologist-Dr. Theador Hawthorne on 7/6-who reviewed his chart-recommendations for outpatient nuclear stress test once he has recovered from his acute illness.  Severe sepsis due to Aerococcus bacteremia/UTI: Febrile to 101 on initial presentation-sepsis physiology has resolved-repeat blood cultures on 7/6 still positive-however blood cultures on 7/8 are negative so far.  On Rocephin/vancomycin.  ID following-with recommendations to pursue TEE-we will discuss with cardiology.    Proteus UTI: Suspect this is a contaminant-however already on Rocephin for above.  Transaminitis: Probably multifactorial in the setting of myolysis/sepsis-repeat liver enzymes periodically.  Acute hepatitis serology negative.  Rhabdomyolysis: Due to likely being down-CK levels downtrending-follow periodically.  Normocytic anemia: Due to acute illness-no evidence of blood loss  HTN: BP well controlled-continue metoprolol-follow/optimize.   DM-2 (A1c 6.3 on 6/30): CBG stable with SSI  Recent Labs    04/04/22  2127 04/05/22 0847 04/05/22 1226  GLUCAP 188* 86 90     BPH: Per nursing staff-never  required Foley catheter insertion this admission-on Flomax-monitor with periodic bladder scans.  Reactive RPR titer-with positive treponema antibody: Given cognitive issues-concern for neurosyphilis-unfortunately-bedside LP not successful by neurology-plans were to pursue fluoroscopy guided LP by ID recommending that we hold off until bacteremia is cleared.    ?  Chronic cognitive dysfunction: See above  Pressure Ulcer: Pressure Injury 03/28/22 Hip Left;Lateral Stage 2 -  Partial thickness loss of dermis presenting as a shallow open injury with a red, pink wound bed without slough. 2 L hip wounds (Active)  03/28/22 1400  Location: Hip  Location Orientation: Left;Lateral  Staging: Stage 2 -  Partial thickness loss of dermis presenting as a shallow open injury with a red, pink wound bed without slough.  Wound Description (Comments): 2 L hip wounds  Present on Admission: Yes  Dressing Type Foam - Lift dressing to assess site every shift 04/04/22 2000    BMI Estimated body mass index is 21.24 kg/m as calculated from the following:   Height as of this encounter: '5\' 5"'$  (1.651 m).   Weight as of this encounter: 57.9 kg.   Code status:   Code Status: Full Code   DVT Prophylaxis: SCDs Start: 03/27/22 2044    Family Communication: Son-[name]-[phone]- on 7/7   Disposition Plan: Status is: Inpatient Remains inpatient appropriate because: Bacteremia/hyponatremia-resolving encephalopathy-noted stable for discharge.   Planned Discharge Destination:Skilled nursing facility   Diet: Diet Order             DIET DYS 3 Room service appropriate? Yes; Fluid consistency: Thin  Diet effective now                     Antimicrobial agents: Anti-infectives (From admission, onward)    Start     Dose/Rate Route Frequency Ordered Stop   04/04/22 1345  vancomycin (VANCOCIN) IVPB 1000 mg/200 mL premix        1,000 mg 200 mL/hr over 60 Minutes Intravenous Every 36 hours 04/04/22 1247      04/01/22 1800  cefTRIAXone (ROCEPHIN) 2 g in sodium chloride 0.9 % 100 mL IVPB        2 g 200 mL/hr over 30 Minutes Intravenous Every 24 hours 04/01/22 1205 04/14/22 2359   03/27/22 1715  cefTRIAXone (ROCEPHIN) 1 g in sodium chloride 0.9 % 100 mL IVPB  Status:  Discontinued        1 g 200 mL/hr over 30 Minutes Intravenous Every 24 hours 03/27/22 1715 04/01/22 1205   03/27/22 1715  vancomycin (VANCOREADY) IVPB 1250 mg/250 mL        1,250 mg 166.7 mL/hr over 90 Minutes Intravenous  Once 03/27/22 1715 03/27/22 2048        MEDICATIONS: Scheduled Meds:  aspirin  81 mg Oral Daily   Gerhardt's butt cream   Topical BID   hydrocerin   Topical BID   insulin aspart  0-9 Units Subcutaneous TID AC & HS   metoprolol tartrate  25 mg Oral BID   tamsulosin  0.4 mg Oral Daily   Continuous Infusions:  sodium chloride 10 mL/hr at 04/04/22 1628   cefTRIAXone (ROCEPHIN)  IV 2 g (04/04/22 1802)   vancomycin Stopped (04/04/22 1619)   PRN Meds:.sodium chloride, acetaminophen **OR** acetaminophen, lip balm, mouth rinse   I have personally reviewed following labs and imaging studies  LABORATORY DATA: CBC: Recent Labs  Lab 03/30/22 0022 03/31/22  0125 04/01/22 0104  WBC 15.0* 11.6* 11.1*  HGB 10.2* 9.1* 9.7*  HCT 32.1* 28.1* 30.0*  MCV 88.9 86.5 87.0  PLT 109* 115* 137*     Basic Metabolic Panel: Recent Labs  Lab 03/30/22 0022 03/31/22 0125 04/01/22 0104 04/02/22 0138 04/04/22 0447  NA 149* 144 146* 146* 143  K 4.2 3.3* 3.7 3.8 4.2  CL 115* 114* 119* 111 114*  CO2 '24 24 23 23 22  '$ GLUCOSE 179* 163* 81 120* 126*  BUN 41* 27* '21 18 19  '$ CREATININE 2.10* 1.60* 1.51* 1.55* 1.48*  CALCIUM 8.2* 7.7* 7.9* 7.9* 7.9*     GFR: Estimated Creatinine Clearance: 28.8 mL/min (A) (by C-G formula based on SCr of 1.48 mg/dL (H)).  Liver Function Tests: Recent Labs  Lab 03/30/22 0022 03/31/22 0125 04/02/22 0138 04/04/22 0447  AST 449* 189* 56* 38  ALT 474* 334* 158* 82*  ALKPHOS 56 49  51 44  BILITOT 0.5 0.4 0.2* 0.5  PROT 6.0* 5.5* 5.9* 6.1*  ALBUMIN 1.7* 1.6* 1.7* 1.6*    No results for input(s): "LIPASE", "AMYLASE" in the last 168 hours. No results for input(s): "AMMONIA" in the last 168 hours.   Coagulation Profile: Recent Labs  Lab 04/03/22 1153  INR 1.2     Cardiac Enzymes: Recent Labs  Lab 04/02/22 0138  CKTOTAL 111     BNP (last 3 results) No results for input(s): "PROBNP" in the last 8760 hours.  Lipid Profile: No results for input(s): "CHOL", "HDL", "LDLCALC", "TRIG", "CHOLHDL", "LDLDIRECT" in the last 72 hours.  Thyroid Function Tests: No results for input(s): "TSH", "T4TOTAL", "FREET4", "T3FREE", "THYROIDAB" in the last 72 hours.  Anemia Panel: No results for input(s): "VITAMINB12", "FOLATE", "FERRITIN", "TIBC", "IRON", "RETICCTPCT" in the last 72 hours.   Urine analysis:    Component Value Date/Time   COLORURINE AMBER (A) 03/27/2022 2030   APPEARANCEUR CLOUDY (A) 03/27/2022 2030   LABSPEC 1.013 03/27/2022 2030   PHURINE 8.0 03/27/2022 2030   GLUCOSEU NEGATIVE 03/27/2022 2030   HGBUR MODERATE (A) 03/27/2022 2030   BILIRUBINUR NEGATIVE 03/27/2022 2030   BILIRUBINUR negative 10/01/2017 0901   BILIRUBINUR neg 04/02/2014 1605   KETONESUR NEGATIVE 03/27/2022 2030   PROTEINUR 100 (A) 03/27/2022 2030   UROBILINOGEN 0.2 10/01/2017 0901   UROBILINOGEN 0.2 02/12/2015 2205   NITRITE NEGATIVE 03/27/2022 2030   LEUKOCYTESUR SMALL (A) 03/27/2022 2030    Sepsis Labs: Lactic Acid, Venous    Component Value Date/Time   LATICACIDVEN 1.9 03/28/2022 0607    MICROBIOLOGY: Recent Results (from the past 240 hour(s))  Resp Panel by RT-PCR (Flu A&B, Covid) Anterior Nasal Swab     Status: None   Collection Time: 03/27/22  5:13 PM   Specimen: Anterior Nasal Swab  Result Value Ref Range Status   SARS Coronavirus 2 by RT PCR NEGATIVE NEGATIVE Final    Comment: (NOTE) SARS-CoV-2 target nucleic acids are NOT DETECTED.  The SARS-CoV-2 RNA is  generally detectable in upper respiratory specimens during the acute phase of infection. The lowest concentration of SARS-CoV-2 viral copies this assay can detect is 138 copies/mL. A negative result does not preclude SARS-Cov-2 infection and should not be used as the sole basis for treatment or other patient management decisions. A negative result may occur with  improper specimen collection/handling, submission of specimen other than nasopharyngeal swab, presence of viral mutation(s) within the areas targeted by this assay, and inadequate number of viral copies(<138 copies/mL). A negative result must be combined with clinical observations, patient  history, and epidemiological information. The expected result is Negative.  Fact Sheet for Patients:  EntrepreneurPulse.com.au  Fact Sheet for Healthcare Providers:  IncredibleEmployment.be  This test is no t yet approved or cleared by the [place] FDA and  has been authorized for detection and/or diagnosis of SARS-CoV-2 by FDA under an Emergency Use Authorization (EUA). This EUA will remain  in effect (meaning this test can be used) for the duration of the COVID-19 declaration under Section 564(b)(1) of the Act, 21 U.S.C.section 360bbb-3(b)(1), unless the authorization is terminated  or revoked sooner.       Influenza A by PCR NEGATIVE NEGATIVE Final   Influenza B by PCR NEGATIVE NEGATIVE Final    Comment: (NOTE) The Xpert Xpress SARS-CoV-2/FLU/RSV plus assay is intended as an aid in the diagnosis of influenza from Nasopharyngeal swab specimens and should not be used as a sole basis for treatment. Nasal washings and aspirates are unacceptable for Xpert Xpress SARS-CoV-2/FLU/RSV testing.  Fact Sheet for Patients: EntrepreneurPulse.com.au  Fact Sheet for Healthcare Providers: IncredibleEmployment.be  This test is not yet approved or cleared by the [place] FDA and has been authorized for detection and/or diagnosis of SARS-CoV-2 by FDA under an Emergency Use Authorization (EUA). This EUA will remain in effect (meaning this test can be used) for the duration of the COVID-19 declaration under Section 564(b)(1) of the Act, 21 U.S.C. section 360bbb-3(b)(1), unless the authorization is terminated or revoked.  Performed at [facility], [place] [address] [place], [place] [address]   Urine Culture     Status: Abnormal   Collection Time: 03/27/22  5:13 PM   Specimen: In/Out Cath Urine  Result Value Ref Range Status   Specimen Description IN/OUT CATH URINE  Final   Special Requests NONE  Final   Culture (A)  Final    60,000 COLONIES/mL PROTEUS MIRABILIS >=100,000 COLONIES/mL AEROCOCCUS SPECIES Standardized susceptibility testing for this organism is not available. Performed at [facility], [place] [address], [place], [place] [address]    Report Status 03/30/2022 FINAL  Final   Organism ID, Bacteria PROTEUS MIRABILIS (A)  Final      Susceptibility   Proteus mirabilis - MIC*    AMPICILLIN <=2 SENSITIVE Sensitive     CEFAZOLIN <=4 SENSITIVE Sensitive     CEFEPIME <=0.12 SENSITIVE Sensitive     CEFTRIAXONE <=0.25 SENSITIVE Sensitive     CIPROFLOXACIN <=0.25 SENSITIVE Sensitive     GENTAMICIN <=1 SENSITIVE Sensitive     IMIPENEM 2 SENSITIVE Sensitive     NITROFURANTOIN 128 RESISTANT Resistant     TRIMETH/SULFA <=20 SENSITIVE Sensitive     AMPICILLIN/SULBACTAM <=2 SENSITIVE Sensitive     PIP/TAZO <=4 SENSITIVE Sensitive     * 60,000 COLONIES/mL PROTEUS MIRABILIS  Blood Culture (routine x 2)     Status: None   Collection Time: 03/27/22  5:37 PM   Specimen: BLOOD  Result Value Ref Range Status   Specimen Description BLOOD RIGHT ANTECUBITAL  Final   Special Requests Blood Culture adequate volume  Final   Culture   Final    NO GROWTH 5 DAYS Performed at [facility], [place] [address] [place], [place] [address]    Report  Status 04/01/2022 FINAL  Final  Blood Culture (routine x 2)     Status: Abnormal   Collection Time: 03/27/22  6:03 PM   Specimen: BLOOD  Result Value Ref Range Status   Specimen Description BLOOD SITE NOT SPECIFIED  Final  Special Requests   Final    BOTTLES DRAWN AEROBIC AND ANAEROBIC Blood Culture adequate volume   Culture  Setup Time   Final    GRAM POSITIVE COCCI IN CLUSTERS IN BOTH AEROBIC AND ANAEROBIC BOTTLES CRITICAL RESULT CALLED TO, READ BACK BY AND VERIFIED WITH: PHARMD G.BARR AT 7035 ON 03/29/2022 BY T.SAAD.    Culture (A)  Final    AEROCOCCUS URINAE Standardized susceptibility testing for this organism is not available. Performed at [facility], [place] [address] [place], [place] [address]    Report Status 04/01/2022 FINAL  Final  Blood Culture ID Panel (Reflexed)     Status: None   Collection Time: 03/27/22  6:03 PM  Result Value Ref Range Status   Enterococcus faecalis NOT DETECTED NOT DETECTED Final   Enterococcus Faecium NOT DETECTED NOT DETECTED Final   Listeria monocytogenes NOT DETECTED NOT DETECTED Final   Staphylococcus species NOT DETECTED NOT DETECTED Final   Staphylococcus aureus (BCID) NOT DETECTED NOT DETECTED Final   Staphylococcus epidermidis NOT DETECTED NOT DETECTED Final   Staphylococcus lugdunensis NOT DETECTED NOT DETECTED Final   Streptococcus species NOT DETECTED NOT DETECTED Final   Streptococcus agalactiae NOT DETECTED NOT DETECTED Final   Streptococcus pneumoniae NOT DETECTED NOT DETECTED Final   Streptococcus pyogenes NOT DETECTED NOT DETECTED Final   A.calcoaceticus-baumannii NOT DETECTED NOT DETECTED Final   Bacteroides fragilis NOT DETECTED NOT DETECTED Final   Enterobacterales NOT DETECTED NOT DETECTED Final   Enterobacter cloacae complex NOT DETECTED NOT DETECTED Final   Escherichia coli NOT DETECTED NOT DETECTED Final   Klebsiella aerogenes NOT DETECTED NOT DETECTED Final   Klebsiella oxytoca NOT DETECTED NOT DETECTED Final    Klebsiella pneumoniae NOT DETECTED NOT DETECTED Final   Proteus species NOT DETECTED NOT DETECTED Final   Salmonella species NOT DETECTED NOT DETECTED Final   Serratia marcescens NOT DETECTED NOT DETECTED Final   Haemophilus influenzae NOT DETECTED NOT DETECTED Final   Neisseria meningitidis NOT DETECTED NOT DETECTED Final   Pseudomonas aeruginosa NOT DETECTED NOT DETECTED Final   Stenotrophomonas maltophilia NOT DETECTED NOT DETECTED Final   Candida albicans NOT DETECTED NOT DETECTED Final   Candida auris NOT DETECTED NOT DETECTED Final   Candida glabrata NOT DETECTED NOT DETECTED Final   Candida krusei NOT DETECTED NOT DETECTED Final   Candida parapsilosis NOT DETECTED NOT DETECTED Final   Candida tropicalis NOT DETECTED NOT DETECTED Final   Cryptococcus neoformans/gattii NOT DETECTED NOT DETECTED Final    Comment: Performed at [facility], [address], [place], [place] [address]  Culture, blood (Routine X 2) w Reflex to ID Panel     Status: None (Preliminary result)   Collection Time: 04/02/22 11:53 AM   Specimen: BLOOD  Result Value Ref Range Status   Specimen Description BLOOD RIGHT ANTECUBITAL  Final   Special Requests   Final    IN BOTH AEROBIC AND ANAEROBIC BOTTLES Blood Culture adequate volume   Culture  Setup Time   Final    GRAM POSITIVE COCCI IN CLUSTERS IN BOTH AEROBIC AND ANAEROBIC BOTTLES CRITICAL RESULT CALLED TO, READ BACK BY AND VERIFIED WITH: PHARMD EMILY S. [id number] FCP    Culture   Final    GRAM POSITIVE COCCI CULTURE REINCUBATED FOR BETTER GROWTH Performed at [facility], [place] [address], [place], [place] [address]    Report Status PENDING  Incomplete  Culture, blood (Routine X 2) w Reflex to ID Panel  Status: None (Preliminary result)   Collection Time: 04/02/22 11:58 AM   Specimen: BLOOD  Result Value Ref Range Status   Specimen Description BLOOD LEFT ANTECUBITAL  Final   Special Requests   Final    BOTTLES DRAWN AEROBIC AND  ANAEROBIC Blood Culture adequate volume   Culture  Setup Time   Final    GRAM POSITIVE COCCI IN PAIRS AEROBIC BOTTLE ONLY CRITICAL VALUE NOTED.  VALUE IS CONSISTENT WITH PREVIOUSLY REPORTED AND CALLED VALUE.    Culture   Final    GRAM POSITIVE COCCI CULTURE REINCUBATED FOR BETTER GROWTH Performed at [facility], [place] [address], [place], [place] [address]    Report Status PENDING  Incomplete  Culture, blood (Routine X 2) w Reflex to ID Panel     Status: None (Preliminary result)   Collection Time: 04/04/22  4:40 AM   Specimen: BLOOD  Result Value Ref Range Status   Specimen Description BLOOD RIGHT ANTECUBITAL  Final   Special Requests   Final    BOTTLES DRAWN AEROBIC AND ANAEROBIC Blood Culture adequate volume   Culture   Final    NO GROWTH 1 DAY Performed at [facility], [place] [address] [place], [place] [address]    Report Status PENDING  Incomplete  Culture, blood (Routine X 2) w Reflex to ID Panel     Status: None (Preliminary result)   Collection Time: 04/04/22  4:46 AM   Specimen: BLOOD  Result Value Ref Range Status   Specimen Description BLOOD LEFT ANTECUBITAL  Final   Special Requests   Final    BOTTLES DRAWN AEROBIC AND ANAEROBIC Blood Culture adequate volume   Culture   Final    NO GROWTH 1 DAY Performed at [facility], [place] [address], [place], [place] [address]    Report Status PENDING  Incomplete  MRSA Next Gen by PCR, Nasal     Status: None   Collection Time: 04/04/22 12:41 PM   Specimen: Nasal Mucosa; Nasal Swab  Result Value Ref Range Status   MRSA by PCR Next Gen NOT DETECTED NOT DETECTED Final    Comment: (NOTE) The GeneXpert MRSA Assay (FDA approved for NASAL specimens only), is one component of a comprehensive MRSA colonization surveillance program. It is not intended to diagnose MRSA infection nor to guide or monitor treatment for MRSA infections. Test performance is not FDA approved in patients less than 44 years old. Performed  at [facility], [place] [address], [place], [place] [address]     RADIOLOGY STUDIES/RESULTS: No results found.   LOS: 9 days   Oren Binet, MD  [facility]    To contact the attending provider between 7A-7P or the covering provider during after hours 7P-7A, please log into the web site [email or web address] and access using universal [place] password for that web site. If you do not have the password, please call the hospital operator.  04/05/2022, 1:42 PM

## 2022-04-06 ENCOUNTER — Inpatient Hospital Stay (HOSPITAL_COMMUNITY): Payer: PPO

## 2022-04-06 DIAGNOSIS — G9340 Encephalopathy, unspecified: Secondary | ICD-10-CM | POA: Diagnosis not present

## 2022-04-06 DIAGNOSIS — E1122 Type 2 diabetes mellitus with diabetic chronic kidney disease: Secondary | ICD-10-CM | POA: Diagnosis not present

## 2022-04-06 DIAGNOSIS — B9689 Other specified bacterial agents as the cause of diseases classified elsewhere: Secondary | ICD-10-CM | POA: Diagnosis not present

## 2022-04-06 DIAGNOSIS — N390 Urinary tract infection, site not specified: Secondary | ICD-10-CM | POA: Diagnosis not present

## 2022-04-06 DIAGNOSIS — E870 Hyperosmolality and hypernatremia: Secondary | ICD-10-CM | POA: Diagnosis not present

## 2022-04-06 DIAGNOSIS — G9341 Metabolic encephalopathy: Secondary | ICD-10-CM | POA: Diagnosis not present

## 2022-04-06 DIAGNOSIS — N179 Acute kidney failure, unspecified: Secondary | ICD-10-CM | POA: Diagnosis not present

## 2022-04-06 DIAGNOSIS — R7881 Bacteremia: Secondary | ICD-10-CM | POA: Diagnosis not present

## 2022-04-06 LAB — CSF CELL COUNT WITH DIFFERENTIAL
RBC Count, CSF: 1 /mm3 — ABNORMAL HIGH
Tube #: 1
WBC, CSF: 1 /mm3 (ref 0–5)

## 2022-04-06 LAB — CBC
HCT: 26.1 % — ABNORMAL LOW (ref 39.0–52.0)
Hemoglobin: 8.3 g/dL — ABNORMAL LOW (ref 13.0–17.0)
MCH: 27.3 pg (ref 26.0–34.0)
MCHC: 31.8 g/dL (ref 30.0–36.0)
MCV: 85.9 fL (ref 80.0–100.0)
Platelets: 329 10*3/uL (ref 150–400)
RBC: 3.04 MIL/uL — ABNORMAL LOW (ref 4.22–5.81)
RDW: 14.9 % (ref 11.5–15.5)
WBC: 10.4 10*3/uL (ref 4.0–10.5)
nRBC: 0 % (ref 0.0–0.2)

## 2022-04-06 LAB — GLUCOSE, CSF: Glucose, CSF: 67 mg/dL (ref 40–70)

## 2022-04-06 LAB — PROTEIN, CSF: Total  Protein, CSF: 95 mg/dL — ABNORMAL HIGH (ref 15–45)

## 2022-04-06 LAB — CULTURE, BLOOD (ROUTINE X 2): Special Requests: ADEQUATE

## 2022-04-06 LAB — BASIC METABOLIC PANEL
Anion gap: 8 (ref 5–15)
BUN: 23 mg/dL (ref 8–23)
CO2: 23 mmol/L (ref 22–32)
Calcium: 8 mg/dL — ABNORMAL LOW (ref 8.9–10.3)
Chloride: 109 mmol/L (ref 98–111)
Creatinine, Ser: 1.61 mg/dL — ABNORMAL HIGH (ref 0.61–1.24)
GFR, Estimated: 41 mL/min — ABNORMAL LOW (ref >60)
Glucose, Bld: 105 mg/dL — ABNORMAL HIGH (ref 70–99)
Potassium: 3.9 mmol/L (ref 3.5–5.1)
Sodium: 140 mmol/L (ref 135–145)

## 2022-04-06 LAB — GLUCOSE, CAPILLARY
Glucose-Capillary: 131 mg/dL — ABNORMAL HIGH (ref 70–99)
Glucose-Capillary: 110 mg/dL — ABNORMAL HIGH (ref 70–99)
Glucose-Capillary: 139 mg/dL — ABNORMAL HIGH (ref 70–99)
Glucose-Capillary: 131 mg/dL — ABNORMAL HIGH (ref 70–99)

## 2022-04-06 MED ORDER — AMLODIPINE BESYLATE 5 MG PO TABS
5.0000 mg | ORAL_TABLET | Freq: Every day | ORAL | Status: DC
  Administered 2022-04-06 – 2022-04-10 (×5): 5 mg via ORAL
  Filled 2022-04-06 (×5): qty 1

## 2022-04-06 MED ORDER — LIDOCAINE HCL (PF) 1 % IJ SOLN
5.0000 mL | Freq: Once | INTRAMUSCULAR | Status: AC
  Administered 2022-04-06: 2 mL via INTRADERMAL
  Filled 2022-04-06: qty 5

## 2022-04-06 MED ORDER — PENICILLIN G POTASSIUM 20000000 UNITS IJ SOLR
6.0000 10*6.[IU] | Freq: Two times a day (BID) | INTRAVENOUS | Status: DC
  Administered 2022-04-06 – 2022-04-10 (×8): 6 10*6.[IU] via INTRAVENOUS
  Filled 2022-04-06 (×2): qty 6
  Filled 2022-04-06 (×2): qty 5
  Filled 2022-04-06 (×6): qty 6
  Filled 2022-04-06: qty 5

## 2022-04-06 NOTE — Progress Notes (Signed)
PROGRESS NOTE        PATIENT DETAILS Name: Sergio [name] Age: 88 y.o. Sex: male Date of Birth: May 05, 1933 Admit Date: 03/27/2022 Admitting Physician Sergio Mura, DO Sergio [name], Sergio Solomons, MD  Brief Summary: Patient is a 89 y.o.  male with history of DM-2, HTN, CKD stage IIIb, chronic HFpEF, suspected dementia/cognitive dysfunction at baseline-presented with confusion-found to have severe hyponatremia.  Significant events: 6/30>> AMS-hypernatremia-admit to [facility]  Significant studies: 6/30>> TSH: 4.8 (minimally elevated)-FT4: Normal 6/30>> NH 4: 53 6/30>> CXR: No PNA 6/30>> CT head: No acute findings 6/30>> CT abdomen/pelvis: No hydronephrosis-cholelithiasis without inflammatory change-enlarged prostate. 7/01>> Echo: EF 65-70%, no regional wall motion abnormality. 7/04>> acute hepatitis serology: Negative 7/06>> vitamin B12: 662 7/06>> RPR: Reactive 7/06>> T. pallidum AB: Reactive  Significant microbiology data: 6/30>> blood culture: Aerococcus urinae in 1/2  6/30>> urine culture: 60,000 colonies of Proteus/100,000 colonies of Aerococcus 6/30>> COVID/influenza PCR: Negative 7/6>> blood culture: Staph capitis 7/8>> blood culture: No growth   Procedures:   Consults: ID  Subjective: Remains pleasantly confused.  No other issues overnight.  Objective: Vitals: Blood pressure (!) 173/58, pulse 64, temperature 98 F (36.7 C), temperature source Oral, resp. rate 20, height '5\' 5"'$  (1.651 m), weight 61.3 kg, SpO2 98 %.   Exam: Gen Exam: Mildly confused but awake and alert-not in any distress.  Looks frail. HEENT:atraumatic, normocephalic Chest: B/L clear to auscultation anteriorly CVS:S1S2 regular Abdomen:soft non tender, non distended Extremities:no edema.  Numerous wounds/dressing-in lower extremities. Neurology: Generalized weakness-but moving all 4 extremities. Skin: no rash   Pertinent Labs/Radiology:    Latest Ref Rng & Units  04/06/2022    1:53 AM 04/01/2022    1:04 AM 03/31/2022    1:25 AM  CBC  WBC 4.0 - 10.5 K/uL 10.4  11.1  11.6   Hemoglobin 13.0 - 17.0 g/dL 8.3  9.7  9.1   Hematocrit 39.0 - 52.0 % 26.1  30.0  28.1   Platelets 150 - 400 K/uL 329  137  115     Lab Results  Component Value Date   NA 140 04/06/2022   K 3.9 04/06/2022   CL 109 04/06/2022   CO2 23 04/06/2022      Assessment/Plan: Acute metabolic encephalopathy: Due to a combination of AKI/hypernatremia-suspect may have some amount of cognitive dysfunction at baseline (see below).   Hypernatremia: Resolved with hydration and other supportive care.  Follow electrolytes periodically.   AKI on CKD stage IIIb: AKI hemodynamically mediated-improved-creatinine at baseline.  Non-STEMI: Given advanced dementia/AKI-managed medically-no wall motion abnormality on echo-EF preserved-continue aspirin//beta-blocker-start statin once CK/LFTs improve further.  Discussed with cardiologist-Sergio [name] on 7/6-who reviewed chart-recommendations for outpatient nuclear stress test once he has recovered from his acute illness.  Severe sepsis due to Aerococcus bacteremia/UTI: Febrile to 101 on initial presentation-sepsis physiology has resolved-repeat blood cultures on 7/6 Staph capitis (likely contamination-repeat blood cultures on 7/8 negative so far.  Remains on Rocephin/vancomycin-discussed with ID physician-Sergio [name]-no need for TEE.  Will await further recommendations.  Proteus UTI: Suspect this is a contaminant-however already on Rocephin for above.  Transaminitis: Probably multifactorial in the setting of myolysis/sepsis-repeat liver enzymes periodically.  Acute hepatitis serology negative.  Rhabdomyolysis: Due to likely being down-CK levels downtrending-follow periodically.  Normocytic anemia: Due to acute illness-no evidence of blood loss  HTN: BP on the higher side-continue metoprolol-add amlodipine.  Follow  and optimize.    DM-2 (A1c 6.3 on 6/30):  CBG stable with SSI  Recent Labs    04/05/22 2104 04/06/22 0611 04/06/22 0836  GLUCAP 166* 131* 110*     BPH: Per nursing staff-never required Foley catheter insertion this admission-on Flomax-monitor with periodic bladder scans.  Reactive RPR titer-with positive treponema antibody: Given cognitive issues-concern for neurosyphilis-unfortunately-bedside LP not successful by neurology-touch base with IR today-we will plan for LP as repeat cultures negative.    ?  Chronic cognitive dysfunction: See above  Pressure Ulcer: Pressure Injury 03/28/22 Hip Left;Lateral Stage 2 -  Partial thickness loss of dermis presenting as a shallow open injury with a red, pink wound bed without slough. 2 L hip wounds (Active)  03/28/22 1400  Location: Hip  Location Orientation: Left;Lateral  Staging: Stage 2 -  Partial thickness loss of dermis presenting as a shallow open injury with a red, pink wound bed without slough.  Wound Description (Comments): 2 L hip wounds  Present on Admission: Yes  Dressing Type Foam - Lift dressing to assess site every shift 04/05/22 2049    BMI Estimated body mass index is 22.49 kg/m as calculated from the following:   Height as of this encounter: '5\' 5"'$  (1.651 m).   Weight as of this encounter: 61.3 kg.   Code status:   Code Status: Full Code   DVT Prophylaxis: SCDs Start: 03/27/22 2044    Family Communication: Son-Sergio [name]-[phone]- on 7/7   Disposition Plan: Status is: Inpatient Remains inpatient appropriate because: Bacteremia/hyponatremia-resolving encephalopathy-not yet stable for discharge.   Planned Discharge Destination:Skilled nursing facility   Diet: Diet Order             DIET DYS 3 Room service appropriate? Yes; Fluid consistency: Thin  Diet effective now                     Antimicrobial agents: Anti-infectives (From admission, onward)    Start     Dose/Rate Route Frequency Ordered Stop   04/04/22 1345  vancomycin (VANCOCIN)  IVPB 1000 mg/200 mL premix        1,000 mg 200 mL/hr over 60 Minutes Intravenous Every 36 hours 04/04/22 1247     04/01/22 1800  cefTRIAXone (ROCEPHIN) 2 g in sodium chloride 0.9 % 100 mL IVPB        2 g 200 mL/hr over 30 Minutes Intravenous Every 24 hours 04/01/22 1205 04/14/22 2359   03/27/22 1715  cefTRIAXone (ROCEPHIN) 1 g in sodium chloride 0.9 % 100 mL IVPB  Status:  Discontinued        1 g 200 mL/hr over 30 Minutes Intravenous Every 24 hours 03/27/22 1715 04/01/22 1205   03/27/22 1715  vancomycin (VANCOREADY) IVPB 1250 mg/250 mL        1,250 mg 166.7 mL/hr over 90 Minutes Intravenous  Once 03/27/22 1715 03/27/22 2048        MEDICATIONS: Scheduled Meds:  aspirin  81 mg Oral Daily   Gerhardt's butt cream   Topical BID   hydrocerin   Topical BID   insulin aspart  0-9 Units Subcutaneous TID AC & HS   lidocaine (PF)  5 mL Intradermal Once   metoprolol tartrate  25 mg Oral BID   tamsulosin  0.4 mg Oral Daily   Continuous Infusions:  sodium chloride 10 mL/hr at 04/04/22 1628   cefTRIAXone (ROCEPHIN)  IV Stopped (04/05/22 1826)   vancomycin 1,000 mg (04/06/22 0259)   PRN Meds:.sodium chloride, acetaminophen **OR** acetaminophen,  lip balm, mouth rinse   I have personally reviewed following labs and imaging studies  LABORATORY DATA: CBC: Recent Labs  Lab 03/31/22 0125 04/01/22 0104 04/06/22 0153  WBC 11.6* 11.1* 10.4  HGB 9.1* 9.7* 8.3*  HCT 28.1* 30.0* 26.1*  MCV 86.5 87.0 85.9  PLT 115* 137* 329     Basic Metabolic Panel: Recent Labs  Lab 03/31/22 0125 04/01/22 0104 04/02/22 0138 04/04/22 0447 04/06/22 0153  NA 144 146* 146* 143 140  K 3.3* 3.7 3.8 4.2 3.9  CL 114* 119* 111 114* 109  CO2 '24 23 23 22 23  '$ GLUCOSE 163* 81 120* 126* 105*  BUN 27* '21 18 19 23  '$ CREATININE 1.60* 1.51* 1.55* 1.48* 1.61*  CALCIUM 7.7* 7.9* 7.9* 7.9* 8.0*     GFR: Estimated Creatinine Clearance: 28 mL/min (A) (by C-G formula based on SCr of 1.61 mg/dL (H)).  Liver  Function Tests: Recent Labs  Lab 03/31/22 0125 04/02/22 0138 04/04/22 0447  AST 189* 56* 38  ALT 334* 158* 82*  ALKPHOS 49 51 44  BILITOT 0.4 0.2* 0.5  PROT 5.5* 5.9* 6.1*  ALBUMIN 1.6* 1.7* 1.6*    No results for input(s): "LIPASE", "AMYLASE" in the last 168 hours. No results for input(s): "AMMONIA" in the last 168 hours.   Coagulation Profile: Recent Labs  Lab 04/03/22 1153  INR 1.2     Cardiac Enzymes: Recent Labs  Lab 04/02/22 0138  CKTOTAL 111     BNP (last 3 results) No results for input(s): "PROBNP" in the last 8760 hours.  Lipid Profile: No results for input(s): "CHOL", "HDL", "LDLCALC", "TRIG", "CHOLHDL", "LDLDIRECT" in the last 72 hours.  Thyroid Function Tests: No results for input(s): "TSH", "T4TOTAL", "FREET4", "T3FREE", "THYROIDAB" in the last 72 hours.  Anemia Panel: No results for input(s): "VITAMINB12", "FOLATE", "FERRITIN", "TIBC", "IRON", "RETICCTPCT" in the last 72 hours.   Urine analysis:    Component Value Date/Time   COLORURINE AMBER (A) 03/27/2022 2030   APPEARANCEUR CLOUDY (A) 03/27/2022 2030   LABSPEC 1.013 03/27/2022 2030   PHURINE 8.0 03/27/2022 2030   GLUCOSEU NEGATIVE 03/27/2022 2030   HGBUR MODERATE (A) 03/27/2022 2030   BILIRUBINUR NEGATIVE 03/27/2022 2030   BILIRUBINUR negative 10/01/2017 0901   BILIRUBINUR neg 04/02/2014 1605   KETONESUR NEGATIVE 03/27/2022 2030   PROTEINUR 100 (A) 03/27/2022 2030   UROBILINOGEN 0.2 10/01/2017 0901   UROBILINOGEN 0.2 02/12/2015 2205   NITRITE NEGATIVE 03/27/2022 2030   LEUKOCYTESUR SMALL (A) 03/27/2022 2030    Sepsis Labs: Lactic Acid, Venous    Component Value Date/Time   LATICACIDVEN 1.9 03/28/2022 0607    MICROBIOLOGY: Recent Results (from the past 240 hour(s))  Resp Panel by RT-PCR (Flu A&B, Covid) Anterior Nasal Swab     Status: None   Collection Time: 03/27/22  5:13 PM   Specimen: Anterior Nasal Swab  Result Value Ref Range Status   SARS Coronavirus 2 by RT PCR  NEGATIVE NEGATIVE Final    Comment: (NOTE) SARS-CoV-2 target nucleic acids are NOT DETECTED.  The SARS-CoV-2 RNA is generally detectable in upper respiratory specimens during the acute phase of infection. The lowest concentration of SARS-CoV-2 viral copies this assay can detect is 138 copies/mL. A negative result does not preclude SARS-Cov-2 infection and should not be used as the sole basis for treatment or other patient management decisions. A negative result may occur with  improper specimen collection/handling, submission of specimen other than nasopharyngeal swab, presence of viral mutation(s) within the areas targeted by this  assay, and inadequate number of viral copies(<138 copies/mL). A negative result must be combined with clinical observations, patient history, and epidemiological information. The expected result is Negative.  Fact Sheet for Patients:  EntrepreneurPulse.com.au  Fact Sheet for Healthcare Providers:  IncredibleEmployment.be  This test is no t yet approved or cleared by the [place] FDA and  has been authorized for detection and/or diagnosis of SARS-CoV-2 by FDA under an Emergency Use Authorization (EUA). This EUA will remain  in effect (meaning this test can be used) for the duration of the COVID-19 declaration under Section 564(b)(1) of the Act, 21 U.S.C.section 360bbb-3(b)(1), unless the authorization is terminated  or revoked sooner.       Influenza A by PCR NEGATIVE NEGATIVE Final   Influenza B by PCR NEGATIVE NEGATIVE Final    Comment: (NOTE) The Xpert Xpress SARS-CoV-2/FLU/RSV plus assay is intended as an aid in the diagnosis of influenza from Nasopharyngeal swab specimens and should not be used as a sole basis for treatment. Nasal washings and aspirates are unacceptable for Xpert Xpress SARS-CoV-2/FLU/RSV testing.  Fact Sheet for Patients: EntrepreneurPulse.com.au  Fact Sheet for  Healthcare Providers: IncredibleEmployment.be  This test is not yet approved or cleared by the [place] FDA and has been authorized for detection and/or diagnosis of SARS-CoV-2 by FDA under an Emergency Use Authorization (EUA). This EUA will remain in effect (meaning this test can be used) for the duration of the COVID-19 declaration under Section 564(b)(1) of the Act, 21 U.S.C. section 360bbb-3(b)(1), unless the authorization is terminated or revoked.  Performed at [facility], [place] [address] [place], [place] [address]   Urine Culture     Status: Abnormal   Collection Time: 03/27/22  5:13 PM   Specimen: In/Out Cath Urine  Result Value Ref Range Status   Specimen Description IN/OUT CATH URINE  Final   Special Requests NONE  Final   Culture (A)  Final    60,000 COLONIES/mL PROTEUS MIRABILIS >=100,000 COLONIES/mL AEROCOCCUS SPECIES Standardized susceptibility testing for this organism is not available. Performed at [facility], [place] [address] [place], [place] [address]    Report Status 03/30/2022 FINAL  Final   Organism ID, Bacteria PROTEUS MIRABILIS (A)  Final      Susceptibility   Proteus mirabilis - MIC*    AMPICILLIN <=2 SENSITIVE Sensitive     CEFAZOLIN <=4 SENSITIVE Sensitive     CEFEPIME <=0.12 SENSITIVE Sensitive     CEFTRIAXONE <=0.25 SENSITIVE Sensitive     CIPROFLOXACIN <=0.25 SENSITIVE Sensitive     GENTAMICIN <=1 SENSITIVE Sensitive     IMIPENEM 2 SENSITIVE Sensitive     NITROFURANTOIN 128 RESISTANT Resistant     TRIMETH/SULFA <=20 SENSITIVE Sensitive     AMPICILLIN/SULBACTAM <=2 SENSITIVE Sensitive     PIP/TAZO <=4 SENSITIVE Sensitive     * 60,000 COLONIES/mL PROTEUS MIRABILIS  Blood Culture (routine x 2)     Status: None   Collection Time: 03/27/22  5:37 PM   Specimen: BLOOD  Result Value Ref Range Status   Specimen Description BLOOD RIGHT ANTECUBITAL  Final   Special Requests Blood Culture adequate volume  Final    Culture   Final    NO GROWTH 5 DAYS Performed at [facility], [place] [address], [place],  [address]    Report Status 04/01/2022 FINAL  Final  Blood Culture (routine x 2)     Status: Abnormal   Collection Time: 03/27/22  6:03 PM   Specimen: BLOOD  Result Value Ref Range Status   Specimen Description BLOOD SITE NOT SPECIFIED  Final   Special Requests   Final    BOTTLES DRAWN AEROBIC AND ANAEROBIC Blood Culture adequate volume   Culture  Setup Time   Final    GRAM POSITIVE COCCI IN CLUSTERS IN BOTH AEROBIC AND ANAEROBIC BOTTLES CRITICAL RESULT CALLED TO, READ BACK BY AND VERIFIED WITH: PHARMD G.BARR AT 6283 ON 03/29/2022 BY T.SAAD.    Culture (A)  Final    AEROCOCCUS URINAE Standardized susceptibility testing for this organism is not available. Performed at [facility], [place] [address], [place], [place] [address]    Report Status 04/01/2022 FINAL  Final  Blood Culture ID Panel (Reflexed)     Status: None   Collection Time: 03/27/22  6:03 PM  Result Value Ref Range Status   Enterococcus faecalis NOT DETECTED NOT DETECTED Final   Enterococcus Faecium NOT DETECTED NOT DETECTED Final   Listeria monocytogenes NOT DETECTED NOT DETECTED Final   Staphylococcus species NOT DETECTED NOT DETECTED Final   Staphylococcus aureus (BCID) NOT DETECTED NOT DETECTED Final   Staphylococcus epidermidis NOT DETECTED NOT DETECTED Final   Staphylococcus lugdunensis NOT DETECTED NOT DETECTED Final   Streptococcus species NOT DETECTED NOT DETECTED Final   Streptococcus agalactiae NOT DETECTED NOT DETECTED Final   Streptococcus pneumoniae NOT DETECTED NOT DETECTED Final   Streptococcus pyogenes NOT DETECTED NOT DETECTED Final   A.calcoaceticus-baumannii NOT DETECTED NOT DETECTED Final   Bacteroides fragilis NOT DETECTED NOT DETECTED Final   Enterobacterales NOT DETECTED NOT DETECTED Final   Enterobacter cloacae complex NOT DETECTED NOT DETECTED Final   Escherichia coli NOT DETECTED NOT  DETECTED Final   Klebsiella aerogenes NOT DETECTED NOT DETECTED Final   Klebsiella oxytoca NOT DETECTED NOT DETECTED Final   Klebsiella pneumoniae NOT DETECTED NOT DETECTED Final   Proteus species NOT DETECTED NOT DETECTED Final   Salmonella species NOT DETECTED NOT DETECTED Final   Serratia marcescens NOT DETECTED NOT DETECTED Final   Haemophilus influenzae NOT DETECTED NOT DETECTED Final   Neisseria meningitidis NOT DETECTED NOT DETECTED Final   Pseudomonas aeruginosa NOT DETECTED NOT DETECTED Final   Stenotrophomonas maltophilia NOT DETECTED NOT DETECTED Final   Candida albicans NOT DETECTED NOT DETECTED Final   Candida auris NOT DETECTED NOT DETECTED Final   Candida glabrata NOT DETECTED NOT DETECTED Final   Candida krusei NOT DETECTED NOT DETECTED Final   Candida parapsilosis NOT DETECTED NOT DETECTED Final   Candida tropicalis NOT DETECTED NOT DETECTED Final   Cryptococcus neoformans/gattii NOT DETECTED NOT DETECTED Final    Comment: Performed at [facility], [address], [place], [place] [address]  Culture, blood (Routine X 2) w Reflex to ID Panel     Status: Abnormal   Collection Time: 04/02/22 11:53 AM   Specimen: BLOOD  Result Value Ref Range Status   Specimen Description BLOOD RIGHT ANTECUBITAL  Final   Special Requests   Final    IN BOTH AEROBIC AND ANAEROBIC BOTTLES Blood Culture adequate volume   Culture  Setup Time   Final    GRAM POSITIVE COCCI IN CLUSTERS IN BOTH AEROBIC AND ANAEROBIC BOTTLES CRITICAL RESULT CALLED TO, READ BACK BY AND VERIFIED WITH: [place]. [phone] [id number] FCP Performed at [facility], [place] [address], [place], [place] [address]    Culture STAPHYLOCOCCUS CAPITIS (A)  Final   Report Status 04/06/2022 FINAL  Final   Organism ID, Bacteria STAPHYLOCOCCUS CAPITIS  Final  Susceptibility   Staphylococcus capitis - MIC*    CIPROFLOXACIN <=0.5 SENSITIVE Sensitive     ERYTHROMYCIN <=0.25 SENSITIVE Sensitive     GENTAMICIN <=0.5  SENSITIVE Sensitive     OXACILLIN RESISTANT Resistant     TETRACYCLINE <=1 SENSITIVE Sensitive     VANCOMYCIN <=0.5 SENSITIVE Sensitive     TRIMETH/SULFA <=10 SENSITIVE Sensitive     CLINDAMYCIN <=0.25 SENSITIVE Sensitive     RIFAMPIN <=0.5 SENSITIVE Sensitive     Inducible Clindamycin NEGATIVE Sensitive     * STAPHYLOCOCCUS CAPITIS  Culture, blood (Routine X 2) w Reflex to ID Panel     Status: Abnormal   Collection Time: 04/02/22 11:58 AM   Specimen: BLOOD  Result Value Ref Range Status   Specimen Description BLOOD LEFT ANTECUBITAL  Final   Special Requests   Final    BOTTLES DRAWN AEROBIC AND ANAEROBIC Blood Culture adequate volume   Culture  Setup Time   Final    GRAM POSITIVE COCCI IN PAIRS AEROBIC BOTTLE ONLY CRITICAL VALUE NOTED.  VALUE IS CONSISTENT WITH PREVIOUSLY REPORTED AND CALLED VALUE. Performed at [facility], [place] [address], [place], [place] [address]    Culture STAPHYLOCOCCUS CAPITIS (A)  Final   Report Status 04/06/2022 FINAL  Final   Organism ID, Bacteria STAPHYLOCOCCUS CAPITIS  Final      Susceptibility   Staphylococcus capitis - MIC*    CIPROFLOXACIN <=0.5 SENSITIVE Sensitive     ERYTHROMYCIN <=0.25 SENSITIVE Sensitive     GENTAMICIN <=0.5 SENSITIVE Sensitive     OXACILLIN RESISTANT Resistant     TETRACYCLINE <=1 SENSITIVE Sensitive     VANCOMYCIN 1 SENSITIVE Sensitive     TRIMETH/SULFA <=10 SENSITIVE Sensitive     CLINDAMYCIN <=0.25 SENSITIVE Sensitive     RIFAMPIN <=0.5 SENSITIVE Sensitive     Inducible Clindamycin NEGATIVE Sensitive     * STAPHYLOCOCCUS CAPITIS  Culture, blood (Routine X 2) w Reflex to ID Panel     Status: None (Preliminary result)   Collection Time: 04/04/22  4:40 AM   Specimen: BLOOD  Result Value Ref Range Status   Specimen Description BLOOD RIGHT ANTECUBITAL  Final   Special Requests   Final    BOTTLES DRAWN AEROBIC AND ANAEROBIC Blood Culture adequate volume   Culture   Final    NO GROWTH 2 DAYS Performed at [facility], [address], [place], [place] [address]    Report Status PENDING  Incomplete  Culture, blood (Routine X 2) w Reflex to ID Panel     Status: None (Preliminary result)   Collection Time: 04/04/22  4:46 AM   Specimen: BLOOD  Result Value Ref Range Status   Specimen Description BLOOD LEFT ANTECUBITAL  Final   Special Requests   Final    BOTTLES DRAWN AEROBIC AND ANAEROBIC Blood Culture adequate volume   Culture   Final    NO GROWTH 2 DAYS Performed at [facility], [place] [address], [place], [place] [address]    Report Status PENDING  Incomplete  MRSA Next Gen by PCR, Nasal     Status: None   Collection Time: 04/04/22 12:41 PM   Specimen: Nasal Mucosa; Nasal Swab  Result Value Ref Range Status   MRSA by PCR Next Gen NOT DETECTED NOT DETECTED Final    Comment: (NOTE) The GeneXpert MRSA Assay (FDA approved for NASAL specimens only), is one component of a comprehensive MRSA colonization surveillance program. It is not intended to diagnose MRSA  infection nor to guide or monitor treatment for MRSA infections. Test performance is not FDA approved in patients less than 49 years old. Performed at [facility], [place] [address] [place], [place] [address]     RADIOLOGY STUDIES/RESULTS: No results found.   LOS: 10 days   Oren Binet, MD  [facility]    To contact the attending provider between 7A-7P or the covering provider during after hours 7P-7A, please log into the web site [email or web address] and access using universal [place] password for that web site. If you do not have the password, please call the hospital operator.  04/06/2022, 12:03 PM

## 2022-04-06 NOTE — Procedures (Signed)
PROCEDURE SUMMARY:  Successful FL guided lumbar puncture at L5-S1.  Yielded 8cc of clear fluid.  No immediate complications.  Pt tolerated well.   Specimen was sent for labs.  EBL negligible  Dammon Makarewicz PA-C 04/06/2022 12:57 PM

## 2022-04-06 NOTE — TOC Progression Note (Signed)
Transition of Care [facility]) - Progression Note    Patient Details  Name: Sergio [name] MRN: [id number] Date of Birth: 14-Aug-1933  Transition of Care [facility]) CM/SW [place], [place] Phone Number: 04/06/2022, 12:10 PM  Clinical Narrative:     CSW notified pt in not yet medically ready to DC. Plan remains for pt to DC to [facility] healthcare mid to end of week. CSW will start insurance authorization when appropriate.TOC will continue to follow for DC needs.  Expected Discharge Plan: [place] Barriers to Discharge: Continued Medical Work up, Ship broker, SNF Pending bed offer  Expected Discharge Plan and Services Expected Discharge Plan: [place] In-house Referral: Clinical Social Work   Post Acute Care Choice: [place] Living arrangements for the past 2 months: Single Family Home                                       Social Determinants of Health (SDOH) Interventions    Readmission Risk Interventions     No data to display

## 2022-04-06 NOTE — Progress Notes (Signed)
[facility] Infectious Disease  Date of Admission:  03/27/2022   Total days of inpatient antibiotics 7  Principal Problem:   Hypernatremia Active Problems:   DM2 (diabetes mellitus, type 2) (HCC)   Acute renal failure superimposed on stage 3b chronic kidney disease (HCC)   Severe sepsis (HCC)   Acute cystitis   Acute metabolic encephalopathy   Acute on chronic urinary retention   NSTEMI (non-ST elevated myocardial infarction) (HCC)   Hypercalcemia   Chronic diastolic CHF (congestive heart failure) (HCC)   Pressure injury of skin          Assessment: 88 year old male presented with confusion from home  admitted for hypernatremia.  Found to have Aerococcus urinae UTI and bacteremia.  #Aerococcus urinae bacteremia 2/2 Aerococcus UTI #Blood Cx+ Staph capitus-(MRSE)c/w contamination -TTE showed no vegetation. TEE not needed as we have a source/endocarditis is not likely at this point. - Patient started on on ceftriaxone 1 g/day -Plan on 2 weeks of ceftriaxone from 2gm/day dosing Recommendations: -D/C ceftriaxone and vancomycin. Although Staph epi sens match, repeat blood Cx are NG(prior to starting vancomcyin) as such staph captus is c/w contamination -Start PEN IV -D/C TEE    #RPR 1:2 #Concern for dementia vs acute encephalopathy #Admitted for hypernatremia-resolved -Of note during 06/2020 hospitalization at [facility] there was concern for dementia vs uremia.  -Called [facility] and this is the first incidence of positive RPR, no Hx of treatment. -I think neurosyphilis is low on the differential, but given likely untreated syphilis(although RPR tier is low) and encephalopathy, needs investigation Reccomendations: -LP today. Please obtain Csf Cx and VDRL -Start PEN IV   Microbiology:   Antibiotics: 6/30 ceftrixone -p Vancomcyin 6/30   Cultures: Blood 6/301/2 Aerococcus 76 2/2 staph captius 7/8 7/4 GPC Urine 6/30 Aerococcus  SUBJECTIVE: Resting in bed.   Interval: Tmax 100.1 wbc 10.4k Review of Systems: Review of Systems  All other systems reviewed and are negative.    Scheduled Meds:  aspirin  81 mg Oral Daily   Gerhardt's butt cream   Topical BID   hydrocerin   Topical BID   insulin aspart  0-9 Units Subcutaneous TID AC & HS   metoprolol tartrate  25 mg Oral BID   tamsulosin  0.4 mg Oral Daily   Continuous Infusions:  sodium chloride 10 mL/hr at 04/04/22 1628   cefTRIAXone (ROCEPHIN)  IV Stopped (04/05/22 1826)   vancomycin 1,000 mg (04/06/22 0259)   PRN Meds:.sodium chloride, acetaminophen **OR** acetaminophen, lip balm, mouth rinse No Known Allergies  OBJECTIVE: Vitals:   04/06/22 0023 04/06/22 0032 04/06/22 0337 04/06/22 0833  BP:  (!) 152/66 (!) 164/65 (!) 173/58  Pulse:  83 93 64  Resp:  (!) '25 20 20  '$ Temp:  100.1 F (37.8 C) 98 F (36.7 C) 98 F (36.7 C)  TempSrc:  Oral Axillary Oral  SpO2:  98%    Weight: 61.3 kg     Height:       Body mass index is 22.49 kg/m.  Physical Exam Constitutional:      General: He is not in acute distress.    Appearance: He is normal weight. He is not toxic-appearing.  HENT:     Head: Normocephalic and atraumatic.     Right Ear: External ear normal.     Left Ear: External ear normal.     Nose: No congestion or rhinorrhea.     Mouth/Throat:     Mouth: Mucous membranes  are moist.     Pharynx: Oropharynx is clear.  Eyes:     Extraocular Movements: Extraocular movements intact.     Conjunctiva/sclera: Conjunctivae normal.     Pupils: Pupils are equal, round, and reactive to light.  Cardiovascular:     Rate and Rhythm: Normal rate and regular rhythm.     Heart sounds: No murmur heard.    No friction rub. No gallop.  Pulmonary:     Effort: Pulmonary effort is normal.     Breath sounds: Normal breath sounds.  Abdominal:     General: Abdomen is flat. Bowel sounds are normal.     Palpations: Abdomen is soft.  Musculoskeletal:        General: No swelling. Normal range of  motion.     Cervical back: Normal range of motion and neck supple.  Skin:    General: Skin is warm and dry.  Neurological:     General: No focal deficit present.     Mental Status: He is oriented to person, place, and time.  Psychiatric:        Mood and Affect: Mood normal.       Lab Results Lab Results  Component Value Date   WBC 10.4 04/06/2022   HGB 8.3 (L) 04/06/2022   HCT 26.1 (L) 04/06/2022   MCV 85.9 04/06/2022   PLT 329 04/06/2022    Lab Results  Component Value Date   CREATININE 1.61 (H) 04/06/2022   BUN 23 04/06/2022   NA 140 04/06/2022   K 3.9 04/06/2022   CL 109 04/06/2022   CO2 23 04/06/2022    Lab Results  Component Value Date   ALT 82 (H) 04/04/2022   AST 38 04/04/2022   ALKPHOS 44 04/04/2022   BILITOT 0.5 04/04/2022        Laurice Record, MD [facility] 04/06/2022, 8:40 AM

## 2022-04-07 ENCOUNTER — Encounter (HOSPITAL_COMMUNITY)
Admission: EM | Disposition: A | Discharge status: Skilled Nursing Facility | Payer: Self-pay | Source: Home / Self Care | Attending: Internal Medicine

## 2022-04-07 DIAGNOSIS — E1122 Type 2 diabetes mellitus with diabetic chronic kidney disease: Secondary | ICD-10-CM | POA: Diagnosis not present

## 2022-04-07 DIAGNOSIS — E870 Hyperosmolality and hypernatremia: Secondary | ICD-10-CM | POA: Diagnosis not present

## 2022-04-07 DIAGNOSIS — G9341 Metabolic encephalopathy: Secondary | ICD-10-CM | POA: Diagnosis not present

## 2022-04-07 DIAGNOSIS — N179 Acute kidney failure, unspecified: Secondary | ICD-10-CM | POA: Diagnosis not present

## 2022-04-07 LAB — GLUCOSE, CAPILLARY
Glucose-Capillary: 104 mg/dL — ABNORMAL HIGH (ref 70–99)
Glucose-Capillary: 103 mg/dL — ABNORMAL HIGH (ref 70–99)
Glucose-Capillary: 154 mg/dL — ABNORMAL HIGH (ref 70–99)
Glucose-Capillary: 162 mg/dL — ABNORMAL HIGH (ref 70–99)
Glucose-Capillary: 212 mg/dL — ABNORMAL HIGH (ref 70–99)

## 2022-04-07 SURGERY — ECHOCARDIOGRAM, TRANSESOPHAGEAL
Anesthesia: Monitor Anesthesia Care

## 2022-04-07 NOTE — Progress Notes (Signed)
PROGRESS NOTE        PATIENT DETAILS Name: Sergio [name] Age: 88 y.o. Sex: male Date of Birth: 07/18/1933 Admit Date: 03/27/2022 Admitting Physician Rhetta Mura, DO QMV:[id number], Arlie Solomons, MD  Brief Summary: Patient is a 88 y.o.  male with history of DM-2, HTN, CKD stage IIIb, chronic HFpEF, suspected dementia/cognitive dysfunction at baseline-presented with confusion-found to have severe hyponatremia.  Significant events: 6/30>> AMS-hypernatremia-admit to [facility]  Significant studies: 6/30>> TSH: 4.8 (minimally elevated)-FT4: Normal 6/30>> NH 4: 53 6/30>> CXR: No PNA 6/30>> CT head: No acute findings 6/30>> CT abdomen/pelvis: No hydronephrosis-cholelithiasis without inflammatory change-enlarged prostate. 7/01>> Echo: EF 65-70%, no regional wall motion abnormality. 7/04>> acute hepatitis serology: Negative 7/06>> vitamin B12: 662 7/06>> RPR: Reactive 7/06>> T. pallidum AB: Reactive  Significant microbiology data: 6/30>> blood culture: Aerococcus urinae in 1/2  6/30>> urine culture: 60,000 colonies of Proteus/100,000 colonies of Aerococcus 6/30>> COVID/influenza PCR: Negative 7/6>> blood culture: Staph capitis (contamination) 7/8>> blood culture: No growth   Procedures: 7/10>> fluoroscopy-guided LP (WBC 1, protein 95)  Consults: ID  Subjective: Pleasantly confused-no major issues overnight.  Lying comfortably in bed.  Objective: Vitals: Blood pressure (!) 169/69, pulse 62, temperature 97.9 F (36.6 C), temperature source Oral, resp. rate 19, height '5\' 5"'$  (1.651 m), weight 61.3 kg, SpO2 100 %.   Exam: Gen Exam:Alert awake-not in any distress HEENT:atraumatic, normocephalic Chest: B/L clear to auscultation anteriorly CVS:S1S2 regular Abdomen:soft non tender, non distended Extremities:no edema Neurology: Generalized weakness-but moving all 4 extremities. Skin: no rash   Pertinent Labs/Radiology:    Latest Ref Rng & Units 04/06/2022     1:53 AM 04/01/2022    1:04 AM 03/31/2022    1:25 AM  CBC  WBC 4.0 - 10.5 K/uL 10.4  11.1  11.6   Hemoglobin 13.0 - 17.0 g/dL 8.3  9.7  9.1   Hematocrit 39.0 - 52.0 % 26.1  30.0  28.1   Platelets 150 - 400 K/uL 329  137  115     Lab Results  Component Value Date   NA 140 04/06/2022   K 3.9 04/06/2022   CL 109 04/06/2022   CO2 23 04/06/2022      Assessment/Plan: Acute metabolic encephalopathy: Due to a combination of AKI/hypernatremia-suspect may have some amount of cognitive dysfunction at baseline (see below).   Hypernatremia: Resolved with hydration and other supportive care.  Follow electrolytes periodically.   AKI on CKD stage IIIb: AKI hemodynamically mediated-improved-creatinine at baseline.  Non-STEMI: Given advanced dementia/AKI-managed medically-no wall motion abnormality on echo-EF preserved-continue aspirin//beta-blocker-start statin once CK/LFTs improve further.  Discussed with cardiologist-Dr. Croitoru on 7/6-who reviewed chart-recommendations for outpatient nuclear stress test once he has recovered from his acute illness.  Severe sepsis due to Aerococcus bacteremia/UTI: Febrile to 101 on initial presentation-sepsis physiology has resolved-ID following-now on penicillin G.  .  Proteus UTI: Suspect this is a contaminant-however already on Rocephin for above.  Reactive RPR titer-with positive treponema antibody: Given cognitive issues-concern for neurosyphilis-s/p fluoroscopy-guided LP on 7/10-significantly elevated protein-awaiting VDRL to CSF.  ID following-already on penicillin G.    Transaminitis: Probably multifactorial in the setting of myolysis/sepsis-repeat liver enzymes periodically.  Acute hepatitis serology negative.  Rhabdomyolysis: Due to likely being down-CK levels downtrending-follow periodically.  Normocytic anemia: Due to acute illness-no evidence of blood loss  HTN: BP relatively well controlled-continue metoprolol and amlodipine.  Follow and  optimize.    DM-2 (A1c 6.3 on 6/30): CBG stable with SSI  Recent Labs    04/07/22 0704 04/07/22 0837 04/07/22 1207  GLUCAP 104* 103* 154*     BPH: Per nursing staff-never required Foley catheter insertion this admission-on Flomax-monitor with periodic bladder scans.    ?  Chronic cognitive dysfunction: See above  Pressure Ulcer: Pressure Injury 03/28/22 Hip Left;Lateral Stage 2 -  Partial thickness loss of dermis presenting as a shallow open injury with a red, pink wound bed without slough. 2 L hip wounds (Active)  03/28/22 1400  Location: Hip  Location Orientation: Left;Lateral  Staging: Stage 2 -  Partial thickness loss of dermis presenting as a shallow open injury with a red, pink wound bed without slough.  Wound Description (Comments): 2 L hip wounds  Present on Admission: Yes  Dressing Type Foam - Lift dressing to assess site every shift 04/07/22 0748    BMI Estimated body mass index is 22.49 kg/m as calculated from the following:   Height as of this encounter: '5\' 5"'$  (1.651 m).   Weight as of this encounter: 61.3 kg.   Code status:   Code Status: Full Code   DVT Prophylaxis: SCDs Start: 03/27/22 2044    Family Communication: Son-[name]-[phone]- on 7/7   Disposition Plan: Status is: Inpatient Remains inpatient appropriate because: Bacteremia/hyponatremia-resolving encephalopathy-not yet stable for discharge.   Planned Discharge Destination:Skilled nursing facility   Diet: Diet Order             DIET DYS 3 Room service appropriate? No; Fluid consistency: Thin  Diet effective now                     Antimicrobial agents: Anti-infectives (From admission, onward)    Start     Dose/Rate Route Frequency Ordered Stop   04/06/22 1500  penicillin G potassium 6 Million Units in dextrose 5 % 500 mL continuous infusion        6 Million Units 41.7 mL/hr over 12 Hours Intravenous Every 12 hours 04/06/22 1404     04/04/22 1345  vancomycin (VANCOCIN)  IVPB 1000 mg/200 mL premix  Status:  Discontinued        1,000 mg 200 mL/hr over 60 Minutes Intravenous Every 36 hours 04/04/22 1247 04/06/22 1404   04/01/22 1800  cefTRIAXone (ROCEPHIN) 2 g in sodium chloride 0.9 % 100 mL IVPB  Status:  Discontinued        2 g 200 mL/hr over 30 Minutes Intravenous Every 24 hours 04/01/22 1205 04/06/22 1404   03/27/22 1715  cefTRIAXone (ROCEPHIN) 1 g in sodium chloride 0.9 % 100 mL IVPB  Status:  Discontinued        1 g 200 mL/hr over 30 Minutes Intravenous Every 24 hours 03/27/22 1715 04/01/22 1205   03/27/22 1715  vancomycin (VANCOREADY) IVPB 1250 mg/250 mL        1,250 mg 166.7 mL/hr over 90 Minutes Intravenous  Once 03/27/22 1715 03/27/22 2048        MEDICATIONS: Scheduled Meds:  amLODipine  5 mg Oral Daily   aspirin  81 mg Oral Daily   Gerhardt's butt cream   Topical BID   hydrocerin   Topical BID   insulin aspart  0-9 Units Subcutaneous TID AC & HS   metoprolol tartrate  25 mg Oral BID   tamsulosin  0.4 mg Oral Daily   Continuous Infusions:  sodium chloride Stopped (04/04/22 2120)   penicillin G potassium  6 Million Units in dextrose 5 % 500 mL continuous infusion 41.7 mL/hr at 04/07/22 1125   PRN Meds:.sodium chloride, acetaminophen **OR** acetaminophen, lip balm, mouth rinse   I have personally reviewed following labs and imaging studies  LABORATORY DATA: CBC: Recent Labs  Lab 04/01/22 0104 04/06/22 0153  WBC 11.1* 10.4  HGB 9.7* 8.3*  HCT 30.0* 26.1*  MCV 87.0 85.9  PLT 137* 329     Basic Metabolic Panel: Recent Labs  Lab 04/01/22 0104 04/02/22 0138 04/04/22 0447 04/06/22 0153  NA 146* 146* 143 140  K 3.7 3.8 4.2 3.9  CL 119* 111 114* 109  CO2 '23 23 22 23  '$ GLUCOSE 81 120* 126* 105*  BUN '21 18 19 23  '$ CREATININE 1.51* 1.55* 1.48* 1.61*  CALCIUM 7.9* 7.9* 7.9* 8.0*     GFR: Estimated Creatinine Clearance: 28 mL/min (A) (by C-G formula based on SCr of 1.61 mg/dL (H)).  Liver Function Tests: Recent Labs  Lab  04/02/22 0138 04/04/22 0447  AST 56* 38  ALT 158* 82*  ALKPHOS 51 44  BILITOT 0.2* 0.5  PROT 5.9* 6.1*  ALBUMIN 1.7* 1.6*    No results for input(s): "LIPASE", "AMYLASE" in the last 168 hours. No results for input(s): "AMMONIA" in the last 168 hours.   Coagulation Profile: Recent Labs  Lab 04/03/22 1153  INR 1.2     Cardiac Enzymes: Recent Labs  Lab 04/02/22 0138  CKTOTAL 111     BNP (last 3 results) No results for input(s): "PROBNP" in the last 8760 hours.  Lipid Profile: No results for input(s): "CHOL", "HDL", "LDLCALC", "TRIG", "CHOLHDL", "LDLDIRECT" in the last 72 hours.  Thyroid Function Tests: No results for input(s): "TSH", "T4TOTAL", "FREET4", "T3FREE", "THYROIDAB" in the last 72 hours.  Anemia Panel: No results for input(s): "VITAMINB12", "FOLATE", "FERRITIN", "TIBC", "IRON", "RETICCTPCT" in the last 72 hours.   Urine analysis:    Component Value Date/Time   COLORURINE AMBER (A) 03/27/2022 2030   APPEARANCEUR CLOUDY (A) 03/27/2022 2030   LABSPEC 1.013 03/27/2022 2030   PHURINE 8.0 03/27/2022 2030   GLUCOSEU NEGATIVE 03/27/2022 2030   HGBUR MODERATE (A) 03/27/2022 2030   BILIRUBINUR NEGATIVE 03/27/2022 2030   BILIRUBINUR negative 10/01/2017 0901   BILIRUBINUR neg 04/02/2014 1605   KETONESUR NEGATIVE 03/27/2022 2030   PROTEINUR 100 (A) 03/27/2022 2030   UROBILINOGEN 0.2 10/01/2017 0901   UROBILINOGEN 0.2 02/12/2015 2205   NITRITE NEGATIVE 03/27/2022 2030   LEUKOCYTESUR SMALL (A) 03/27/2022 2030    Sepsis Labs: Lactic Acid, Venous    Component Value Date/Time   LATICACIDVEN 1.9 03/28/2022 0607    MICROBIOLOGY: Recent Results (from the past 240 hour(s))  Culture, blood (Routine X 2) w Reflex to ID Panel     Status: Abnormal   Collection Time: 04/02/22 11:53 AM   Specimen: BLOOD  Result Value Ref Range Status   Specimen Description BLOOD RIGHT ANTECUBITAL  Final   Special Requests   Final    IN BOTH AEROBIC AND ANAEROBIC BOTTLES Blood  Culture adequate volume   Culture  Setup Time   Final    GRAM POSITIVE COCCI IN CLUSTERS IN BOTH AEROBIC AND ANAEROBIC BOTTLES CRITICAL RESULT CALLED TO, READ BACK BY AND VERIFIED WITH: PHARMD EMILY S. [phone] FCP Performed at [facility], [place] [address] [place], [place] [address]    Culture STAPHYLOCOCCUS CAPITIS (A)  Final   Report Status 04/06/2022 FINAL  Final   Organism ID, Bacteria STAPHYLOCOCCUS CAPITIS  Final  Susceptibility   Staphylococcus capitis - MIC*    CIPROFLOXACIN <=0.5 SENSITIVE Sensitive     ERYTHROMYCIN <=0.25 SENSITIVE Sensitive     GENTAMICIN <=0.5 SENSITIVE Sensitive     OXACILLIN RESISTANT Resistant     TETRACYCLINE <=1 SENSITIVE Sensitive     VANCOMYCIN <=0.5 SENSITIVE Sensitive     TRIMETH/SULFA <=10 SENSITIVE Sensitive     CLINDAMYCIN <=0.25 SENSITIVE Sensitive     RIFAMPIN <=0.5 SENSITIVE Sensitive     Inducible Clindamycin NEGATIVE Sensitive     * STAPHYLOCOCCUS CAPITIS  Culture, blood (Routine X 2) w Reflex to ID Panel     Status: Abnormal   Collection Time: 04/02/22 11:58 AM   Specimen: BLOOD  Result Value Ref Range Status   Specimen Description BLOOD LEFT ANTECUBITAL  Final   Special Requests   Final    BOTTLES DRAWN AEROBIC AND ANAEROBIC Blood Culture adequate volume   Culture  Setup Time   Final    GRAM POSITIVE COCCI IN PAIRS AEROBIC BOTTLE ONLY CRITICAL VALUE NOTED.  VALUE IS CONSISTENT WITH PREVIOUSLY REPORTED AND CALLED VALUE. Performed at [facility], [place] [address], [place], [place] [address]    Culture STAPHYLOCOCCUS CAPITIS (A)  Final   Report Status 04/06/2022 FINAL  Final   Organism ID, Bacteria STAPHYLOCOCCUS CAPITIS  Final      Susceptibility   Staphylococcus capitis - MIC*    CIPROFLOXACIN <=0.5 SENSITIVE Sensitive     ERYTHROMYCIN <=0.25 SENSITIVE Sensitive     GENTAMICIN <=0.5 SENSITIVE Sensitive     OXACILLIN RESISTANT Resistant     TETRACYCLINE <=1 SENSITIVE Sensitive     VANCOMYCIN 1 SENSITIVE  Sensitive     TRIMETH/SULFA <=10 SENSITIVE Sensitive     CLINDAMYCIN <=0.25 SENSITIVE Sensitive     RIFAMPIN <=0.5 SENSITIVE Sensitive     Inducible Clindamycin NEGATIVE Sensitive     * STAPHYLOCOCCUS CAPITIS  Culture, blood (Routine X 2) w Reflex to ID Panel     Status: None (Preliminary result)   Collection Time: 04/04/22  4:40 AM   Specimen: BLOOD  Result Value Ref Range Status   Specimen Description BLOOD RIGHT ANTECUBITAL  Final   Special Requests   Final    BOTTLES DRAWN AEROBIC AND ANAEROBIC Blood Culture adequate volume   Culture   Final    NO GROWTH 3 DAYS Performed at [facility], [address] [place], [place] [address]    Report Status PENDING  Incomplete  Culture, blood (Routine X 2) w Reflex to ID Panel     Status: None (Preliminary result)   Collection Time: 04/04/22  4:46 AM   Specimen: BLOOD  Result Value Ref Range Status   Specimen Description BLOOD LEFT ANTECUBITAL  Final   Special Requests   Final    BOTTLES DRAWN AEROBIC AND ANAEROBIC Blood Culture adequate volume   Culture   Final    NO GROWTH 3 DAYS Performed at [facility], [place] [address], [place], [place] [address]    Report Status PENDING  Incomplete  MRSA Next Gen by PCR, Nasal     Status: None   Collection Time: 04/04/22 12:41 PM   Specimen: Nasal Mucosa; Nasal Swab  Result Value Ref Range Status   MRSA by PCR Next Gen NOT DETECTED NOT DETECTED Final    Comment: (NOTE) The GeneXpert MRSA Assay (FDA approved for NASAL specimens only), is one component of a comprehensive MRSA colonization surveillance program. It is not intended to diagnose MRSA infection  nor to guide or monitor treatment for MRSA infections. Test performance is not FDA approved in patients less than 75 years old. Performed at [facility], [place] [address], [place], [place] [address]   CSF culture w Gram Stain     Status: None (Preliminary result)   Collection Time: 04/06/22 12:26 PM   Specimen: PATH  Cytology CSF; Cerebrospinal Fluid  Result Value Ref Range Status   Specimen Description CSF  Final   Special Requests NONE  Final   Gram Stain NO WBC SEEN NO ORGANISMS SEEN CYTOSPIN SMEAR   Final   Culture   Final    NO GROWTH < 24 HOURS Performed at [facility], [place] [address], [place], [place] [address]    Report Status PENDING  Incomplete    RADIOLOGY STUDIES/RESULTS: DG FL GUIDED LUMBAR PUNCTURE  Result Date: 04/06/2022 CLINICAL DATA:  Encephalopathy, failed bedside attempts EXAM: DIAGNOSTIC LUMBAR PUNCTURE UNDER FLUOROSCOPIC GUIDANCE COMPARISON:  None Available. FLUOROSCOPY: Radiation Exposure Index (as provided by the fluoroscopic device): 3.60 mGy Kerma PROCEDURE: Informed consent was obtained from the patient's family prior to the procedure, including potential complications of headache, allergy, and pain. With the patient prone, the lower back was prepped with Betadine. 1% Lidocaine was used for local anesthesia. Lumbar puncture was performed at the L5-S1 level using a 20 gauge needle with return of clear CSF with an opening pressure of 19 cm water. 8 ml of CSF were obtained for laboratory studies. The patient tolerated the procedure well and there were no apparent complications. IMPRESSION: Technically successful lumbar puncture as described above. This exam was performed by PA Suezanne Jacquet, and was supervised and interpreted by Audie Pinto, MD. Electronically Signed   By: Audie Pinto M.D.   On: 04/06/2022 13:25     LOS: 11 days   Oren Binet, MD  [facility]    To contact the attending provider between 7A-7P or the covering provider during after hours 7P-7A, please log into the web site [email or web address] and access using universal [place] password for that web site. If you do not have the password, please call the hospital operator.  04/07/2022, 12:33 PM

## 2022-04-07 NOTE — Progress Notes (Signed)
Physical Therapy Treatment Patient Details Name: Sergio [name] MRN: [id number] DOB: 05-Jan-1933 Today's Date: 04/07/2022   History of Present Illness Pt is a 88 y/o male presenting on 6/30 with AMS.  Found with acute hypernatremia, elevated troponin due to severe sepsis from UTI and dehydration, AKI. PMHx:  chronic urinary retention, CKD3b, DM, CHF, HTN, PE.    PT Comments    Pt seen for mobility progression. Upon PT arrival, pt's breakfast tray was sitting to the side and pt seemingly unaware of its presence. Pt agreeable to sit upright at EOB and attempt to eat. He required mod-max A for bed mobility but once positioned in sitting he was able to maintain fair sitting balance with supervision for safety while feeding himself. Pt would continue to benefit from skilled physical therapy services at this time while admitted and after d/c to address the below listed limitations in order to improve overall safety and independence with functional mobility.   Recommendations for follow up therapy are one component of a multi-disciplinary discharge planning process, led by the attending physician.  Recommendations may be updated based on patient status, additional functional criteria and insurance authorization.  Follow Up Recommendations  Skilled nursing-short term rehab (<3 hours/day) Can patient physically be transported by private vehicle: No   Assistance Recommended at Discharge Frequent or constant Supervision/Assistance  Patient can return home with the following Two people to help with walking and/or transfers;Two people to help with bathing/dressing/bathroom;Assistance with cooking/housework;Assist for transportation;Help with stairs or ramp for entrance   Equipment Recommendations  None recommended by PT    Recommendations for Other Services       Precautions / Restrictions Precautions Precautions: Fall Precaution Comments: LE wounds Restrictions Weight Bearing Restrictions: No      Mobility  Bed Mobility Overal bed mobility: Needs Assistance Bed Mobility: Supine to Sit, Sit to Supine     Supine to sit: Mod assist, HOB elevated Sit to supine: Max assist   General bed mobility comments: pt required increased time and effort, HOB elevated, multimodal cueing to attempt participation, assistance needed to move bilateral LEs off of and back onto bed and for trunk management    Transfers                   General transfer comment: deferred - focus of session was on sitting balance while self feeding    Ambulation/Gait                   Stairs             Wheelchair Mobility    Modified Rankin (Stroke Patients Only)       Balance Overall balance assessment: Needs assistance Sitting-balance support: Feet supported, No upper extremity supported Sitting balance-Leahy Scale: Fair Sitting balance - Comments: pt able to sit upright at EOB (~15 mins) while eating his breakfast with close min guard and progressing to supervision for balance while self feeding                                    Cognition Arousal/Alertness: Awake/alert Behavior During Therapy: Flat affect Overall Cognitive Status: No family/caregiver present to determine baseline cognitive functioning Area of Impairment: Orientation, Attention, Memory, Following commands, Safety/judgement, Awareness, Problem solving                 Orientation Level: Disoriented to, Situation Current Attention Level: Sustained Memory: Decreased short-term  memory Following Commands: Follows one step commands with increased time, Follows one step commands inconsistently Safety/Judgement: Decreased awareness of deficits, Decreased awareness of safety Awareness: Intellectual Problem Solving: Slow processing, Decreased initiation, Requires verbal cues, Difficulty sequencing, Requires tactile cues          Exercises      General Comments        Pertinent  Vitals/Pain Pain Assessment Pain Assessment: Faces Faces Pain Scale: No hurt    Home Living                          Prior Function            PT Goals (current goals can now be found in the care plan section) Acute Rehab PT Goals PT Goal Formulation: Patient unable to participate in goal setting Time For Goal Achievement: 04/13/22 Potential to Achieve Goals: Fair Progress towards PT goals: Progressing toward goals    Frequency    Min 2X/week      PT Plan Current plan remains appropriate    Co-evaluation              AM-PAC PT "6 Clicks" Mobility   Outcome Measure  Help needed turning from your back to your side while in a flat bed without using bedrails?: A Lot Help needed moving from lying on your back to sitting on the side of a flat bed without using bedrails?: A Lot Help needed moving to and from a bed to a chair (including a wheelchair)?: Total Help needed standing up from a chair using your arms (e.g., wheelchair or bedside chair)?: Total Help needed to walk in hospital room?: Total Help needed climbing 3-5 steps with a railing? : Total 6 Click Score: 8    End of Session   Activity Tolerance: Patient limited by fatigue Patient left: in bed;with call bell/phone within reach;with bed alarm set Nurse Communication: Mobility status PT Visit Diagnosis: Muscle weakness (generalized) (M62.81);Other abnormalities of gait and mobility (R26.89);Difficulty in walking, not elsewhere classified (R26.2);Pain     Time: 6073-7106 PT Time Calculation (min) (ACUTE ONLY): 27 min  Charges:  $Therapeutic Activity: 23-37 mins                     Anastasio Champion, DPT  Acute Rehabilitation Services Office [place] 04/07/2022, 9:42 AM

## 2022-04-07 NOTE — TOC Progression Note (Signed)
Transition of Care [facility]) - Progression Note    Patient Details  Name: Sergio [name] MRN: [id number] Date of Birth: [birth year]  Transition of Care [facility] CM/SW [place], LCSW Phone Number: 04/07/2022, 3:22 PM  Clinical Narrative:    CSW touched base with [facility] and confirmed they are able to accept patient on IV Penicillin once stable.    Expected Discharge Plan: [place] Barriers to Discharge: Continued Medical Work up, Ship broker, SNF Pending bed offer  Expected Discharge Plan and Services Expected Discharge Plan: [place] In-house Referral: Clinical Social Work   Post Acute Care Choice: [place] Living arrangements for the past 2 months: Single Family Home                                       Social Determinants of Health (SDOH) Interventions    Readmission Risk Interventions     No data to display

## 2022-04-08 DIAGNOSIS — G9340 Encephalopathy, unspecified: Secondary | ICD-10-CM | POA: Diagnosis not present

## 2022-04-08 DIAGNOSIS — E870 Hyperosmolality and hypernatremia: Secondary | ICD-10-CM | POA: Diagnosis not present

## 2022-04-08 DIAGNOSIS — G9341 Metabolic encephalopathy: Secondary | ICD-10-CM | POA: Diagnosis not present

## 2022-04-08 DIAGNOSIS — R7881 Bacteremia: Secondary | ICD-10-CM | POA: Diagnosis not present

## 2022-04-08 DIAGNOSIS — N390 Urinary tract infection, site not specified: Secondary | ICD-10-CM | POA: Diagnosis not present

## 2022-04-08 DIAGNOSIS — I5032 Chronic diastolic (congestive) heart failure: Secondary | ICD-10-CM | POA: Diagnosis not present

## 2022-04-08 DIAGNOSIS — B9689 Other specified bacterial agents as the cause of diseases classified elsewhere: Secondary | ICD-10-CM | POA: Diagnosis not present

## 2022-04-08 DIAGNOSIS — N179 Acute kidney failure, unspecified: Secondary | ICD-10-CM | POA: Diagnosis not present

## 2022-04-08 LAB — BASIC METABOLIC PANEL
Anion gap: 6 (ref 5–15)
Anion gap: 7 (ref 5–15)
BUN: 26 mg/dL — ABNORMAL HIGH (ref 8–23)
BUN: 24 mg/dL — ABNORMAL HIGH (ref 8–23)
CO2: 23 mmol/L (ref 22–32)
CO2: 24 mmol/L (ref 22–32)
Calcium: 7.8 mg/dL — ABNORMAL LOW (ref 8.9–10.3)
Calcium: 8.1 mg/dL — ABNORMAL LOW (ref 8.9–10.3)
Chloride: 107 mmol/L (ref 98–111)
Chloride: 107 mmol/L (ref 98–111)
Creatinine, Ser: 1.61 mg/dL — ABNORMAL HIGH (ref 0.61–1.24)
Creatinine, Ser: 1.51 mg/dL — ABNORMAL HIGH (ref 0.61–1.24)
GFR, Estimated: 41 mL/min — ABNORMAL LOW (ref >60)
GFR, Estimated: 44 mL/min — ABNORMAL LOW (ref >60)
Glucose, Bld: 101 mg/dL — ABNORMAL HIGH (ref 70–99)
Glucose, Bld: 121 mg/dL — ABNORMAL HIGH (ref 70–99)
Potassium: 4 mmol/L (ref 3.5–5.1)
Potassium: 3.9 mmol/L (ref 3.5–5.1)
Sodium: 136 mmol/L (ref 135–145)
Sodium: 138 mmol/L (ref 135–145)

## 2022-04-08 LAB — MAGNESIUM: Magnesium: 2.5 mg/dL — ABNORMAL HIGH (ref 1.7–2.4)

## 2022-04-08 LAB — GLUCOSE, CAPILLARY
Glucose-Capillary: 126 mg/dL — ABNORMAL HIGH (ref 70–99)
Glucose-Capillary: 129 mg/dL — ABNORMAL HIGH (ref 70–99)
Glucose-Capillary: 112 mg/dL — ABNORMAL HIGH (ref 70–99)
Glucose-Capillary: 129 mg/dL — ABNORMAL HIGH (ref 70–99)

## 2022-04-08 LAB — CBC
HCT: 28.1 % — ABNORMAL LOW (ref 39.0–52.0)
Hemoglobin: 9 g/dL — ABNORMAL LOW (ref 13.0–17.0)
MCH: 27.3 pg (ref 26.0–34.0)
MCHC: 32 g/dL (ref 30.0–36.0)
MCV: 85.2 fL (ref 80.0–100.0)
Platelets: 342 10*3/uL (ref 150–400)
RBC: 3.3 MIL/uL — ABNORMAL LOW (ref 4.22–5.81)
RDW: 14.8 % (ref 11.5–15.5)
WBC: 9.5 10*3/uL (ref 4.0–10.5)
nRBC: 0 % (ref 0.0–0.2)

## 2022-04-08 LAB — VDRL, CSF: VDRL Quant, CSF: NONREACTIVE

## 2022-04-08 MED ORDER — ATORVASTATIN CALCIUM 40 MG PO TABS
40.0000 mg | ORAL_TABLET | Freq: Every day | ORAL | Status: DC
  Administered 2022-04-08 – 2022-04-10 (×3): 40 mg via ORAL
  Filled 2022-04-08 (×3): qty 1

## 2022-04-08 NOTE — Plan of Care (Signed)

## 2022-04-08 NOTE — Progress Notes (Signed)
Occupational Therapy Treatment Patient Details Name: Sergio [name] MRN: [id number] DOB: [birth year] Today's Date: 04/08/2022   History of present illness Pt is a 88 y/o male presenting on 6/30 with AMS.  Found with acute hypernatremia, elevated troponin due to severe sepsis from UTI and dehydration, AKI. PMHx:  chronic urinary retention, CKD3b, DM, CHF, HTN, PE.   OT comments  Pt making incremental progress with OT goals. He was very resistive to all mobility this session, requiring Max A for all Bed mobility. Once EOB pt requiring min A to maintain upright posture. Worked on self feeding and grooming, requiring mod A to complete. Additionally, pt requiring max verbal cuing as he only followed approximately 50% of commands. Continuing to recommend SNF to maximize independence, OT will follow acutely.    Recommendations for follow up therapy are one component of a multi-disciplinary discharge planning process, led by the attending physician.  Recommendations may be updated based on patient status, additional functional criteria and insurance authorization.    Follow Up Recommendations  Skilled nursing-short term rehab (<3 hours/day)    Assistance Recommended at Discharge Frequent or constant Supervision/Assistance  Patient can return home with the following  Two people to help with walking and/or transfers;Two people to help with bathing/dressing/bathroom;Assistance with cooking/housework;Assistance with feeding;Direct supervision/assist for medications management;Direct supervision/assist for financial management;Assist for transportation;Help with stairs or ramp for entrance   Equipment Recommendations  None recommended by OT    Recommendations for Other Services      Precautions / Restrictions Precautions Precautions: Fall Restrictions Weight Bearing Restrictions: No       Mobility Bed Mobility Overal bed mobility: Needs Assistance Bed Mobility: Supine to Sit, Sit to Supine      Supine to sit: Max assist Sit to supine: Max assist   General bed mobility comments: Pt resistive this session, requiring max for all movements.    Transfers                   General transfer comment: Deferred due to pt being resistive to sitting EOB     Balance Overall balance assessment: Needs assistance Sitting-balance support: Feet supported, Bilateral upper extremity supported Sitting balance-Leahy Scale: Poor Sitting balance - Comments: Leaning to the R, requiring min A to maintain upright sitting                                   ADL either performed or assessed with clinical judgement   ADL Overall ADL's : Needs assistance/impaired Eating/Feeding: Minimal assistance;Sitting Eating/Feeding Details (indicate cue type and reason): Worked on drinking tea sitting EOB, needed support under elbow to support RUE Grooming: Dance movement psychotherapist;Wash/dry hands;Moderate assistance;Sitting Grooming Details (indicate cue type and reason): Difficulty motor planning/following commands to complete task                               General ADL Comments: Worked on BADL's sitting EOB    Extremity/Trunk Assessment              Vision       Perception     Praxis      Cognition Arousal/Alertness: Lethargic Behavior During Therapy: Flat affect Overall Cognitive Status: No family/caregiver present to determine baseline cognitive functioning  General Comments: Pt followed approximately 50% of commands this session, fearful of all movement        Exercises      Shoulder Instructions       General Comments VSS on RA    Pertinent Vitals/ Pain       Pain Assessment Pain Assessment: Faces Faces Pain Scale: Hurts little more Pain Location: B LES (R>L) with movement Pain Descriptors / Indicators: Discomfort, Grimacing, Guarding Pain Intervention(s): Limited activity within patient's tolerance,  Monitored during session, Repositioned  Home Living                                          Prior Functioning/Environment              Frequency  Min 2X/week        Progress Toward Goals  OT Goals(current goals can now be found in the care plan section)  Progress towards OT goals: Progressing toward goals  Acute Rehab OT Goals Patient Stated Goal: none stated OT Goal Formulation: With patient Time For Goal Achievement: 04/14/22 Potential to Achieve Goals: Fair ADL Goals Pt Will Perform Grooming: with set-up;sitting Pt Will Perform Upper Body Dressing: with set-up;sitting Pt Will Transfer to Toilet: with mod assist;with +2 assist;bedside commode;stand pivot transfer Additional ADL Goal #1: Pt will follow 1 step commands with increased time and 90% accuracy for ADL engagement. Additional ADL Goal #2: Pt will complete bed mobility with mod assist as precursor to ADLS.  Plan Discharge plan remains appropriate;Frequency remains appropriate    Co-evaluation                 AM-PAC OT "6 Clicks" Daily Activity     Outcome Measure   Help from another person eating meals?: A Little Help from another person taking care of personal grooming?: A Little Help from another person toileting, which includes using toliet, bedpan, or urinal?: Total Help from another person bathing (including washing, rinsing, drying)?: A Lot Help from another person to put on and taking off regular upper body clothing?: A Lot Help from another person to put on and taking off regular lower body clothing?: Total 6 Click Score: 12    End of Session    OT Visit Diagnosis: Other abnormalities of gait and mobility (R26.89);Muscle weakness (generalized) (M62.81);Pain;Other symptoms and signs involving cognitive function Pain - Right/Left: Right Pain - part of body: Leg   Activity Tolerance Patient tolerated treatment well   Patient Left in bed;with call bell/phone within  reach   Nurse Communication Mobility status        Time: 1610-9604 OT Time Calculation (min): 18 min  Charges: OT General Charges $OT Visit: 1 Visit OT Treatments $Self Care/Home Management : 8-22 mins  Allan Minotti H., OTR/L Acute Rehabilitation  Rayford Williamsen Elane Ilo [place] 04/08/2022, 7:01 PM

## 2022-04-08 NOTE — Progress Notes (Signed)
[facility] for Infectious Disease  Date of Admission:  03/27/2022   Total days of inpatient antibiotics 7  Principal Problem:   Hypernatremia Active Problems:   DM2 (diabetes mellitus, type 2) (HCC)   Acute renal failure superimposed on stage 3b chronic kidney disease (HCC)   Severe sepsis (HCC)   Acute cystitis   Acute metabolic encephalopathy   Acute on chronic urinary retention   NSTEMI (non-ST elevated myocardial infarction) (HCC)   Hypercalcemia   Chronic diastolic CHF (congestive heart failure) (HCC)   Pressure injury of skin          Assessment: 88 year old male presented with confusion from home  admitted for hypernatremia.  Found to have Aerococcus urinae UTI and bacteremia.  #Aerococcus urinae bacteremia 2/2 Aerococcus UTI #Blood Cx+ Staph capitus-(MRSE)c/w contamination -TTE showed no vegetation. TEE not needed as we have a source/endocarditis is not likely at this point. -D/C vancomycin. Although Staph epi sens match, repeat blood Cx are NG(prior to starting vancomcyin) as such staph captus is c/w contamination. D/c d TEE. Recommendations: -Continue PEN IV(pt switched from ceftriaxone to penicillin to cover for neurosyphilis as well). Will plan on 2 weeks from ceftriaxone 2gm/day for bacteremia EOT 7/18 if vdrl negative. IF VDRL positive then EOT 7/24.    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  -Called [facility] and this is the first incidence of positive RPR, no Hx of treatment. -I think neurosyphilis is low on the differential, but given likely untreated syphilis(although RPR tier is low) and encephalopathy, needs investigation -7/6 HIV negative -LP on 7/10 with 1 wbc, 1 rbc, ptn 95, glc 67, csf Cx Ngx2d, VDRL pending Reccomendations: -Follow-up VDRL. I called lab to hold extra sample in case we need it for  further testing, in the setting of elevated PTN -Continue PEN IV   Microbiology:   Antibiotics: 6/30 ceftrixone -p Vancomcyin 6/30   Cultures: Blood 6/301/2 Aerococcus 76 2/2 staph captius 7/8 7/4 GPC Urine 6/30 Aerococcus  SUBJECTIVE: Resting in bed. Alert to place Interval: Tmax 99.3 wbc 9.5k Review of Systems: Review of Systems  All other systems reviewed and are negative.    Scheduled Meds:  amLODipine  5 mg Oral Daily   aspirin  81 mg Oral Daily   atorvastatin  40 mg Oral Daily   Gerhardt's butt cream   Topical BID   hydrocerin   Topical BID   insulin aspart  0-9 Units Subcutaneous TID AC & HS   metoprolol tartrate  25 mg Oral BID   tamsulosin  0.4 mg Oral Daily   Continuous Infusions:  sodium chloride Stopped (04/04/22 2120)   penicillin G potassium 6 Million Units in dextrose 5 % 500 mL continuous infusion 6 Million Units (04/08/22 0429)   PRN Meds:.sodium chloride, acetaminophen **OR** acetaminophen, lip balm, mouth rinse No Known Allergies  OBJECTIVE: Vitals:   04/07/22 2034 04/08/22 0400 04/08/22 0800 04/08/22 1135  BP: 137/60 (!) 126/59 (!) 143/63 (!) 142/69  Pulse: 67 (!) 59    Resp: (!) 23 19    Temp: 97.9 F (36.6 C) 99.3 F (37.4 C) 97.6 F (36.4 C) 98.2 F (36.8 C)  TempSrc: Oral Oral Oral Oral  SpO2: 97% 100%    Weight:      Height:       Body mass index is 22.49 kg/m.  Physical Exam Constitutional:  General: He is not in acute distress.    Appearance: He is normal weight. He is not toxic-appearing.  HENT:     Head: Normocephalic and atraumatic.     Right Ear: External ear normal.     Left Ear: External ear normal.     Nose: No congestion or rhinorrhea.     Mouth/Throat:     Mouth: Mucous membranes are moist.     Pharynx: Oropharynx is clear.  Eyes:     Extraocular Movements: Extraocular movements intact.     Conjunctiva/sclera: Conjunctivae normal.     Pupils: Pupils are equal, round, and reactive to light.   Cardiovascular:     Rate and Rhythm: Normal rate and regular rhythm.     Heart sounds: No murmur heard.    No friction rub. No gallop.  Pulmonary:     Effort: Pulmonary effort is normal.     Breath sounds: Normal breath sounds.  Abdominal:     General: Abdomen is flat. Bowel sounds are normal.     Palpations: Abdomen is soft.  Musculoskeletal:        General: No swelling. Normal range of motion.     Cervical back: Normal range of motion and neck supple.  Skin:    General: Skin is warm and dry.  Neurological:     General: No focal deficit present.     Mental Status: He is oriented to person, place, and time.  Psychiatric:        Mood and Affect: Mood normal.       Lab Results Lab Results  Component Value Date   WBC 9.5 04/08/2022   HGB 9.0 (L) 04/08/2022   HCT 28.1 (L) 04/08/2022   MCV 85.2 04/08/2022   PLT 342 04/08/2022    Lab Results  Component Value Date   CREATININE 1.51 (H) 04/08/2022   BUN 24 (H) 04/08/2022   NA 138 04/08/2022   K 3.9 04/08/2022   CL 107 04/08/2022   CO2 24 04/08/2022    Lab Results  Component Value Date   ALT 82 (H) 04/04/2022   AST 38 04/04/2022   ALKPHOS 44 04/04/2022   BILITOT 0.5 04/04/2022        Laurice Record, [facility] for Infectious Disease [facility] 04/08/2022, 2:36 PM

## 2022-04-08 NOTE — TOC Progression Note (Signed)
Transition of Care [facility]) - Progression Note    Patient Details  Name: Sergio [name] MRN: [id number] Date of Birth: 14-Jan-1933  Transition of Care [facility]) CM/SW [place], LCSW Phone Number: 04/08/2022, 1:29 PM  Clinical Narrative:    CSW submitted info to [facility] for approval process for rehab at [facility].    Expected Discharge Plan: [place] Barriers to Discharge: Continued Medical Work up, Ship broker, SNF Pending bed offer  Expected Discharge Plan and Services Expected Discharge Plan: [place] In-house Referral: Clinical Social Work   Post Acute Care Choice: [place] Living arrangements for the past 2 months: Single Family Home                                       Social Determinants of Health (SDOH) Interventions    Readmission Risk Interventions     No data to display

## 2022-04-08 NOTE — Progress Notes (Signed)
PROGRESS NOTE        PATIENT DETAILS Name: Sergio [name] Age: 88 y.o. Sex: male Date of Birth: [birth year] Admit Date: 03/27/2022 Admitting Physician Rhetta Mura, DO MIW:[id number], Arlie Solomons, MD  Brief Summary: Patient is a 88 y.o.  male with history of DM-2, HTN, CKD stage IIIb, chronic HFpEF, suspected dementia/cognitive dysfunction at baseline-presented with confusion-found to have severe hyponatremia.  Significant events: 6/30>> AMS-hypernatremia-admit to [facility]  Significant studies: 6/30>> TSH: 4.8 (minimally elevated)-FT4: Normal 6/30>> NH 4: 53 6/30>> CXR: No PNA 6/30>> CT head: No acute findings 6/30>> CT abdomen/pelvis: No hydronephrosis-cholelithiasis without inflammatory change-enlarged prostate. 7/01>> Echo: EF 65-70%, no regional wall motion abnormality. 7/04>> acute hepatitis serology: Negative 7/06>> vitamin B12: 662 7/06>> RPR: Reactive 7/06>> T. pallidum AB: Reactive  Significant microbiology data: 6/30>> blood culture: Aerococcus urinae in 1/2  6/30>> urine culture: 60,000 colonies of Proteus/100,000 colonies of Aerococcus 6/30>> COVID/influenza PCR: Negative 7/6>> blood culture: Staph capitis (contamination) 7/8>> blood culture: No growth   Procedures: 7/10>> fluoroscopy-guided LP (WBC 1, protein 95)  Consults: ID  Subjective: Changed-lying comfortably in bed-remains pleasantly confused-follows simple commands.  Objective: Vitals: Blood pressure (!) 142/69, pulse (!) 59, temperature 98.2 F (36.8 C), temperature source Oral, resp. rate 19, height '5\' 5"'$  (1.651 m), weight 61.3 kg, SpO2 100 %.   Exam: Gen Exam:Alert awake-not in any distress HEENT:atraumatic, normocephalic Chest: B/L clear to auscultation anteriorly CVS:S1S2 regular Abdomen:soft non tender, non distended Extremities:no edema Neurology: Generalized weakness-but moves all 4 extremities. Skin: no rash   Pertinent Labs/Radiology:    Latest Ref Rng &  Units 04/08/2022   12:59 AM 04/06/2022    1:53 AM 04/01/2022    1:04 AM  CBC  WBC 4.0 - 10.5 K/uL 9.5  10.4  11.1   Hemoglobin 13.0 - 17.0 g/dL 9.0  8.3  9.7   Hematocrit 39.0 - 52.0 % 28.1  26.1  30.0   Platelets 150 - 400 K/uL 342  329  137     Lab Results  Component Value Date   NA 138 04/08/2022   K 3.9 04/08/2022   CL 107 04/08/2022   CO2 24 04/08/2022      Assessment/Plan: Acute metabolic encephalopathy: Due to a combination of AKI/hypernatremia-suspect may have some amount of cognitive dysfunction at baseline (see below).   Hypernatremia: Resolved with hydration and other supportive care.  Follow electrolytes periodically.   AKI on CKD stage IIIb: AKI hemodynamically mediated-improved-creatinine at baseline.  Non-STEMI: Given advanced dementia/AKI-managed medically-no wall motion abnormality on echo-EF preserved-continue aspirin//beta-blocker/statin (now that his LFTs are significantly better).Discussed with cardiologist-Dr. Croitoru on 7/6-who reviewed chart-recommendations for outpatient nuclear stress test once he has recovered from his acute illness.  Severe sepsis due to Aerococcus bacteremia/UTI: Febrile to 101 on initial presentation-sepsis physiology has resolved-ID following-now on penicillin G.  .  Proteus UTI: Suspect this is a contaminant-however already on Rocephin for above.  Reactive RPR titer-with positive treponema antibody-rule out neurosyphilis: Given cognitive issues-concern for neurosyphilis-s/p fluoroscopy-guided LP on 7/10-significantly elevated protein-awaiting VDRL to CSF.  ID following-already on penicillin G.    Transaminitis: Probably multifactorial in the setting of myolysis/sepsis-repeat liver enzymes periodically.  Acute hepatitis serology negative.  Rhabdomyolysis: Due to likely being down-CK levels downtrending-follow periodically.  Normocytic anemia: Due to acute illness-no evidence of blood loss  HTN: BP relatively well controlled-continue  metoprolol and amlodipine.  Follow and optimize.    DM-2 (A1c 6.3 on 6/30): CBG stable with SSI  Recent Labs    04/07/22 1542 04/07/22 2042 04/08/22 0642  GLUCAP 162* 212* 126*     BPH: Per nursing staff-never required Foley catheter insertion this admission-on Flomax-monitor with periodic bladder scans.    ?  Chronic cognitive dysfunction: See above  Pressure Ulcer: Pressure Injury 03/28/22 Hip Left;Lateral Stage 2 -  Partial thickness loss of dermis presenting as a shallow open injury with a red, pink wound bed without slough. 2 L hip wounds (Active)  03/28/22 1400  Location: Hip  Location Orientation: Left;Lateral  Staging: Stage 2 -  Partial thickness loss of dermis presenting as a shallow open injury with a red, pink wound bed without slough.  Wound Description (Comments): 2 L hip wounds  Present on Admission: Yes  Dressing Type Foam - Lift dressing to assess site every shift 04/08/22 0950    BMI Estimated body mass index is 22.49 kg/m as calculated from the following:   Height as of this encounter: '5\' 5"'$  (1.651 m).   Weight as of this encounter: 61.3 kg.   Code status:   Code Status: Full Code   DVT Prophylaxis: SCDs Start: 03/27/22 2044    Family Communication: Son-[name]-[phone]- on 7/7   Disposition Plan: Status is: Inpatient Remains inpatient appropriate because: Bacteremia/hyponatremia-resolving encephalopathy-not yet stable for discharge.  Awaiting VDRL CSF to determine appropriate duration of penicillin G treatment.   Planned Discharge Destination:Skilled nursing facility   Diet: Diet Order             DIET DYS 3 Room service appropriate? No; Fluid consistency: Thin  Diet effective now                     Antimicrobial agents: Anti-infectives (From admission, onward)    Start     Dose/Rate Route Frequency Ordered Stop   04/06/22 1500  penicillin G potassium 6 Million Units in dextrose 5 % 500 mL continuous infusion        6  Million Units 41.7 mL/hr over 12 Hours Intravenous Every 12 hours 04/06/22 1404     04/04/22 1345  vancomycin (VANCOCIN) IVPB 1000 mg/200 mL premix  Status:  Discontinued        1,000 mg 200 mL/hr over 60 Minutes Intravenous Every 36 hours 04/04/22 1247 04/06/22 1404   04/01/22 1800  cefTRIAXone (ROCEPHIN) 2 g in sodium chloride 0.9 % 100 mL IVPB  Status:  Discontinued        2 g 200 mL/hr over 30 Minutes Intravenous Every 24 hours 04/01/22 1205 04/06/22 1404   03/27/22 1715  cefTRIAXone (ROCEPHIN) 1 g in sodium chloride 0.9 % 100 mL IVPB  Status:  Discontinued        1 g 200 mL/hr over 30 Minutes Intravenous Every 24 hours 03/27/22 1715 04/01/22 1205   03/27/22 1715  vancomycin (VANCOREADY) IVPB 1250 mg/250 mL        1,250 mg 166.7 mL/hr over 90 Minutes Intravenous  Once 03/27/22 1715 03/27/22 2048        MEDICATIONS: Scheduled Meds:  amLODipine  5 mg Oral Daily   aspirin  81 mg Oral Daily   Gerhardt's butt cream   Topical BID   hydrocerin   Topical BID   insulin aspart  0-9 Units Subcutaneous TID AC & HS   metoprolol tartrate  25 mg Oral BID   tamsulosin  0.4 mg Oral Daily   Continuous Infusions:  sodium chloride Stopped (04/04/22 2120)   penicillin G potassium 6 Million Units in dextrose 5 % 500 mL continuous infusion 6 Million Units (04/08/22 0429)   PRN Meds:.sodium chloride, acetaminophen **OR** acetaminophen, lip balm, mouth rinse   I have personally reviewed following labs and imaging studies  LABORATORY DATA: CBC: Recent Labs  Lab 04/06/22 0153 04/08/22 0059  WBC 10.4 9.5  HGB 8.3* 9.0*  HCT 26.1* 28.1*  MCV 85.9 85.2  PLT 329 342     Basic Metabolic Panel: Recent Labs  Lab 04/02/22 0138 04/04/22 0447 04/06/22 0153 04/08/22 0059 04/08/22 1001  NA 146* 143 140 136 138  K 3.8 4.2 3.9 4.0 3.9  CL 111 114* 109 107 107  CO2 '23 22 23 23 24  '$ GLUCOSE 120* 126* 105* 101* 121*  BUN '18 19 23 '$ 26* 24*  CREATININE 1.55* 1.48* 1.61* 1.61* 1.51*  CALCIUM  7.9* 7.9* 8.0* 7.8* 8.1*  MG  --   --   --   --  2.5*     GFR: Estimated Creatinine Clearance: 29.9 mL/min (A) (by C-G formula based on SCr of 1.51 mg/dL (H)).  Liver Function Tests: Recent Labs  Lab 04/02/22 0138 04/04/22 0447  AST 56* 38  ALT 158* 82*  ALKPHOS 51 44  BILITOT 0.2* 0.5  PROT 5.9* 6.1*  ALBUMIN 1.7* 1.6*    No results for input(s): "LIPASE", "AMYLASE" in the last 168 hours. No results for input(s): "AMMONIA" in the last 168 hours.   Coagulation Profile: Recent Labs  Lab 04/03/22 1153  INR 1.2     Cardiac Enzymes: Recent Labs  Lab 04/02/22 0138  CKTOTAL 111     BNP (last 3 results) No results for input(s): "PROBNP" in the last 8760 hours.  Lipid Profile: No results for input(s): "CHOL", "HDL", "LDLCALC", "TRIG", "CHOLHDL", "LDLDIRECT" in the last 72 hours.  Thyroid Function Tests: No results for input(s): "TSH", "T4TOTAL", "FREET4", "T3FREE", "THYROIDAB" in the last 72 hours.  Anemia Panel: No results for input(s): "VITAMINB12", "FOLATE", "FERRITIN", "TIBC", "IRON", "RETICCTPCT" in the last 72 hours.   Urine analysis:    Component Value Date/Time   COLORURINE AMBER (A) 03/27/2022 2030   APPEARANCEUR CLOUDY (A) 03/27/2022 2030   LABSPEC 1.013 03/27/2022 2030   PHURINE 8.0 03/27/2022 2030   GLUCOSEU NEGATIVE 03/27/2022 2030   HGBUR MODERATE (A) 03/27/2022 2030   BILIRUBINUR NEGATIVE 03/27/2022 2030   BILIRUBINUR negative 10/01/2017 0901   BILIRUBINUR neg 04/02/2014 1605   KETONESUR NEGATIVE 03/27/2022 2030   PROTEINUR 100 (A) 03/27/2022 2030   UROBILINOGEN 0.2 10/01/2017 0901   UROBILINOGEN 0.2 02/12/2015 2205   NITRITE NEGATIVE 03/27/2022 2030   LEUKOCYTESUR SMALL (A) 03/27/2022 2030    Sepsis Labs: Lactic Acid, Venous    Component Value Date/Time   LATICACIDVEN 1.9 03/28/2022 0607    MICROBIOLOGY: Recent Results (from the past 240 hour(s))  Culture, blood (Routine X 2) w Reflex to ID Panel     Status: Abnormal    Collection Time: 04/02/22 11:53 AM   Specimen: BLOOD  Result Value Ref Range Status   Specimen Description BLOOD RIGHT ANTECUBITAL  Final   Special Requests   Final    IN BOTH AEROBIC AND ANAEROBIC BOTTLES Blood Culture adequate volume   Culture  Setup Time   Final    GRAM POSITIVE COCCI IN CLUSTERS IN BOTH AEROBIC AND ANAEROBIC BOTTLES CRITICAL RESULT CALLED TO, READ BACK BY AND VERIFIED WITH: Juluis Pitch [id number] FCP Performed at [facility],  [address], [place], [place] [address]    Culture STAPHYLOCOCCUS CAPITIS (A)  Final   Report Status 04/06/2022 FINAL  Final   Organism ID, Bacteria STAPHYLOCOCCUS CAPITIS  Final      Susceptibility   Staphylococcus capitis - MIC*    CIPROFLOXACIN <=0.5 SENSITIVE Sensitive     ERYTHROMYCIN <=0.25 SENSITIVE Sensitive     GENTAMICIN <=0.5 SENSITIVE Sensitive     OXACILLIN RESISTANT Resistant     TETRACYCLINE <=1 SENSITIVE Sensitive     VANCOMYCIN <=0.5 SENSITIVE Sensitive     TRIMETH/SULFA <=10 SENSITIVE Sensitive     CLINDAMYCIN <=0.25 SENSITIVE Sensitive     RIFAMPIN <=0.5 SENSITIVE Sensitive     Inducible Clindamycin NEGATIVE Sensitive     * STAPHYLOCOCCUS CAPITIS  Culture, blood (Routine X 2) w Reflex to ID Panel     Status: Abnormal   Collection Time: 04/02/22 11:58 AM   Specimen: BLOOD  Result Value Ref Range Status   Specimen Description BLOOD LEFT ANTECUBITAL  Final   Special Requests   Final    BOTTLES DRAWN AEROBIC AND ANAEROBIC Blood Culture adequate volume   Culture  Setup Time   Final    GRAM POSITIVE COCCI IN PAIRS AEROBIC BOTTLE ONLY CRITICAL VALUE NOTED.  VALUE IS CONSISTENT WITH PREVIOUSLY REPORTED AND CALLED VALUE. Performed at [facility], [place] [address], [place], [place] [address]    Culture STAPHYLOCOCCUS CAPITIS (A)  Final   Report Status 04/06/2022 FINAL  Final   Organism ID, Bacteria STAPHYLOCOCCUS CAPITIS  Final      Susceptibility   Staphylococcus capitis - MIC*    CIPROFLOXACIN  <=0.5 SENSITIVE Sensitive     ERYTHROMYCIN <=0.25 SENSITIVE Sensitive     GENTAMICIN <=0.5 SENSITIVE Sensitive     OXACILLIN RESISTANT Resistant     TETRACYCLINE <=1 SENSITIVE Sensitive     VANCOMYCIN 1 SENSITIVE Sensitive     TRIMETH/SULFA <=10 SENSITIVE Sensitive     CLINDAMYCIN <=0.25 SENSITIVE Sensitive     RIFAMPIN <=0.5 SENSITIVE Sensitive     Inducible Clindamycin NEGATIVE Sensitive     * STAPHYLOCOCCUS CAPITIS  Culture, blood (Routine X 2) w Reflex to ID Panel     Status: None (Preliminary result)   Collection Time: 04/04/22  4:40 AM   Specimen: BLOOD  Result Value Ref Range Status   Specimen Description BLOOD RIGHT ANTECUBITAL  Final   Special Requests   Final    BOTTLES DRAWN AEROBIC AND ANAEROBIC Blood Culture adequate volume   Culture   Final    NO GROWTH 4 DAYS Performed at [facility], [address], [place], [place] [address]    Report Status PENDING  Incomplete  Culture, blood (Routine X 2) w Reflex to ID Panel     Status: None (Preliminary result)   Collection Time: 04/04/22  4:46 AM   Specimen: BLOOD  Result Value Ref Range Status   Specimen Description BLOOD LEFT ANTECUBITAL  Final   Special Requests   Final    BOTTLES DRAWN AEROBIC AND ANAEROBIC Blood Culture adequate volume   Culture   Final    NO GROWTH 4 DAYS Performed at [facility], [place] [address], [place], [place] [address]    Report Status PENDING  Incomplete  MRSA Next Gen by PCR, Nasal     Status: None   Collection Time: 04/04/22 12:41 PM   Specimen: Nasal Mucosa; Nasal Swab  Result Value Ref Range Status   MRSA by PCR Next Gen  NOT DETECTED NOT DETECTED Final    Comment: (NOTE) The GeneXpert MRSA Assay (FDA approved for NASAL specimens only), is one component of a comprehensive MRSA colonization surveillance program. It is not intended to diagnose MRSA infection nor to guide or monitor treatment for MRSA infections. Test performance is not FDA approved in patients less than 60  years old. Performed at [facility], [place] [address] [place], [place] [address]   CSF culture w Gram Stain     Status: None (Preliminary result)   Collection Time: 04/06/22 12:26 PM   Specimen: PATH Cytology CSF; Cerebrospinal Fluid  Result Value Ref Range Status   Specimen Description CSF  Final   Special Requests NONE  Final   Gram Stain NO WBC SEEN NO ORGANISMS SEEN CYTOSPIN SMEAR   Final   Culture   Final    NO GROWTH 2 DAYS Performed at [facility], [address], [place], [place] [address]    Report Status PENDING  Incomplete    RADIOLOGY STUDIES/RESULTS: No results found.   LOS: 12 days   Oren Binet, MD  [facility]    To contact the attending provider between 7A-7P or the covering provider during after hours 7P-7A, please log into the web site [email or web address] and access using universal [place] password for that web site. If you do not have the password, please call the hospital operator.  04/08/2022, 12:20 PM

## 2022-04-09 ENCOUNTER — Inpatient Hospital Stay: Payer: Self-pay

## 2022-04-09 ENCOUNTER — Inpatient Hospital Stay (HOSPITAL_COMMUNITY): Payer: PPO

## 2022-04-09 DIAGNOSIS — E870 Hyperosmolality and hypernatremia: Secondary | ICD-10-CM | POA: Diagnosis not present

## 2022-04-09 DIAGNOSIS — G9341 Metabolic encephalopathy: Secondary | ICD-10-CM | POA: Diagnosis not present

## 2022-04-09 DIAGNOSIS — I5032 Chronic diastolic (congestive) heart failure: Secondary | ICD-10-CM | POA: Diagnosis not present

## 2022-04-09 DIAGNOSIS — N179 Acute kidney failure, unspecified: Secondary | ICD-10-CM | POA: Diagnosis not present

## 2022-04-09 LAB — CSF CULTURE W GRAM STAIN
Culture: NO GROWTH
Gram Stain: NONE SEEN

## 2022-04-09 LAB — CULTURE, BLOOD (ROUTINE X 2)
Culture: NO GROWTH
Culture: NO GROWTH
Special Requests: ADEQUATE
Special Requests: ADEQUATE

## 2022-04-09 LAB — MENINGITIS/ENCEPHALITIS PANEL (CSF)

## 2022-04-09 LAB — GLUCOSE, CAPILLARY
Glucose-Capillary: 109 mg/dL — ABNORMAL HIGH (ref 70–99)
Glucose-Capillary: 128 mg/dL — ABNORMAL HIGH (ref 70–99)
Glucose-Capillary: 146 mg/dL — ABNORMAL HIGH (ref 70–99)
Glucose-Capillary: 212 mg/dL — ABNORMAL HIGH (ref 70–99)
Glucose-Capillary: 99 mg/dL (ref 70–99)

## 2022-04-09 NOTE — Progress Notes (Signed)
PHARMACY CONSULT NOTE FOR:  OUTPATIENT  PARENTERAL ANTIBIOTIC THERAPY (OPAT)  Indication: Aerococcus bacteremia Regimen: Penicillin G 12 million units every 24 hours as a continuous infusion End date: 04/14/22   IV antibiotic discharge orders are pended. To discharging provider:  please sign these orders via discharge navigator,  Select New Orders & click on the button choice - Manage This Unsigned Work.     Thank you for allowing pharmacy to be a part of this patient's care.  Jimmy Footman, PharmD, BCPS, BCIDP Infectious Diseases Clinical Pharmacist Phone: [phone] 04/09/2022, 10:13 AM

## 2022-04-09 NOTE — Progress Notes (Signed)
Attempted to reach son via telephone for PICC consent.  No answer. PICC unable to be placed until consent is obtained, PICC team will attempt to reach son again 7/14.  Tammy, RN updated.

## 2022-04-09 NOTE — TOC Progression Note (Signed)
Transition of Care [facility]) - Progression Note    Patient Details  Name: Sergio [name] MRN: [id number] Date of Birth: 1933/04/26  Transition of Care [facility]) CM/SW [place], LCSW Phone Number: 04/09/2022, 10:56 AM  Clinical Narrative:    CSW received insurance approval for [employer] for 7 days, Auth# [id number]. PTAR auth# [id number].   Expected Discharge Plan: [place] Barriers to Discharge: Continued Medical Work up  Expected Discharge Plan and Services Expected Discharge Plan: [place] In-house Referral: Clinical Social Work   Post Acute Care Choice: [place] Living arrangements for the past 2 months: Single Family Home                                       Social Determinants of Health (SDOH) Interventions    Readmission Risk Interventions     No data to display

## 2022-04-09 NOTE — Progress Notes (Addendum)
[place] for Infectious Disease  Date of Admission:  03/27/2022   Total days of inpatient antibiotics 14  Principal Problem:   Hypernatremia Active Problems:   DM2 (diabetes mellitus, type 2) (HCC)   Acute renal failure superimposed on stage 3b chronic kidney disease (HCC)   Severe sepsis (HCC)   Acute cystitis   Acute metabolic encephalopathy   Acute on chronic urinary retention   NSTEMI (non-ST elevated myocardial infarction) (HCC)   Hypercalcemia   Chronic diastolic CHF (congestive heart failure) (HCC)   Pressure injury of skin          Assessment: 88 year old male presented with confusion from home  admitted for hypernatremia.  Found to have Aerococcus urinae UTI and bacteremia.  #Aerococcus urinae bacteremia 2/2 Aerococcus UTI #Blood Cx+ Staph capitus-(MRSE)c/w contamination -TTE showed no vegetation. TEE not needed as we have a source/endocarditis is not likely at this point. -D/C vancomycin. Although Staph epi sens match, repeat blood Cx are NG(prior to starting vancomcyin) as such staph captus is c/w contamination. D/c d TEE. Recommendations: -Continue PEN IV. Will plan on 2 weeks from ceftriaxone 2gm/day for bacteremia EOT 7/18. Then needs one dose PEN 2.4 MU IM on 7/19 and another dose of PEN 2.4 MU IM on 7/26   #Late latent syphilis #Concern for dementia vs acute encephalopathy #Admitted for hypernatremia- resolved -RPR 1:2 -Of note during 06/2020 hospitalization at [facility] there was concern for dementia vs uremia.  -Called [facility] and this is the first incidence of positive RPR, no Hx of treatment. -7/6 HIV negative -LP on 7/10 with 1 wbc, 1 rbc, ptn 95, glc 67, csf Cx Ngx2d, VDRL negative, ME panel negative. Needs Tx for late latent syphilis Reccomendations: -Late latent syphilis treatment as above   ID will sign off, please engage with any question or concerns.   OPAT ORDERS:  Diagnosis: Aerococcus bacteremia, late latent syphilis  Culture  Result: Bcx+ aerococcus  No Known Allergies   Discharge antibiotics to be given via PICC line:  Per pharmacy protocol  PEN G 12MU q12h continuous infusion    Duration: 2 weeks End Date: 7/18  [facility] Care Per Protocol with [employer] Use: Home health RN for IV administration and teaching, line care and labs.    Labs weekly while on IV antibiotics: __ CBC with differential __ CMP __ CRP __ ESR   __ Please pull PIC at completion of IV antibiotics   Fax weekly labs to [phone]  Clinic Follow Up Appt: 7/21  @ [facility] with Dr. Candiss Norse  Microbiology:   Antibiotics: 6/30 ceftrixone -p Vancomcyin 6/30   Cultures: Blood 6/301/2 Aerococcus 7/6 2/2 staph captius 7/8NG Urine 6/30 Aerococcus  SUBJECTIVE: Resting in bed.  No new compliants Interval: Afebrile overnight.  Review of Systems: Review of Systems  All other systems reviewed and are negative.    Scheduled Meds:  amLODipine  5 mg Oral Daily   aspirin  81 mg Oral Daily   atorvastatin  40 mg Oral Daily   Gerhardt's butt cream   Topical BID   hydrocerin   Topical BID   insulin aspart  0-9 Units Subcutaneous TID AC & HS   metoprolol tartrate  25 mg Oral BID   tamsulosin  0.4 mg Oral Daily   Continuous Infusions:  sodium chloride Stopped (04/04/22 2120)   penicillin G potassium 6 Million Units in dextrose 5 % 500 mL continuous infusion 6 Million Units (04/09/22 1731)   PRN Meds:.sodium  chloride, acetaminophen **OR** acetaminophen, lip balm, mouth rinse No Known Allergies  OBJECTIVE: Vitals:   04/09/22 0913 04/09/22 0914 04/09/22 1206 04/09/22 1602  BP: (!) 152/58  (!) 133/51 117/63  Pulse:  (!) 58 61 68  Resp: 16  17 20   Temp:   98.2 F (36.8 C) 98.4 F (36.9 C)  TempSrc:   Oral Oral  SpO2:      Weight:      Height:       Body mass index is 20.98 kg/m.  Physical Exam Constitutional:      General: He is not in acute distress.    Appearance: He is normal weight. He is not toxic-appearing.   HENT:     Head: Normocephalic and atraumatic.     Right Ear: External ear normal.     Left Ear: External ear normal.     Nose: No congestion or rhinorrhea.     Mouth/Throat:     Mouth: Mucous membranes are moist.     Pharynx: Oropharynx is clear.  Eyes:     Extraocular Movements: Extraocular movements intact.     Conjunctiva/sclera: Conjunctivae normal.     Pupils: Pupils are equal, round, and reactive to light.  Cardiovascular:     Rate and Rhythm: Normal rate and regular rhythm.     Heart sounds: No murmur heard.    No friction rub. No gallop.  Pulmonary:     Effort: Pulmonary effort is normal.     Breath sounds: Normal breath sounds.  Abdominal:     General: Abdomen is flat. Bowel sounds are normal.     Palpations: Abdomen is soft.  Musculoskeletal:        General: No swelling. Normal range of motion.     Cervical back: Normal range of motion and neck supple.  Skin:    General: Skin is warm and dry.  Neurological:     General: No focal deficit present.     Mental Status: He is oriented to person, place, and time.  Psychiatric:        Mood and Affect: Mood normal.       Lab Results Lab Results  Component Value Date   WBC 9.5 04/08/2022   HGB 9.0 (L) 04/08/2022   HCT 28.1 (L) 04/08/2022   MCV 85.2 04/08/2022   PLT 342 04/08/2022    Lab Results  Component Value Date   CREATININE 1.51 (H) 04/08/2022   BUN 24 (H) 04/08/2022   NA 138 04/08/2022   K 3.9 04/08/2022   CL 107 04/08/2022   CO2 24 04/08/2022    Lab Results  Component Value Date   ALT 82 (H) 04/04/2022   AST 38 04/04/2022   ALKPHOS 44 04/04/2022   BILITOT 0.5 04/04/2022        Laurice Record, [place] for Infectious Disease [facility] 04/09/2022, 6:41 PM

## 2022-04-09 NOTE — Progress Notes (Signed)
Attempted to reach son via telephone x2 for consent for PICC.  No answer to calls.  Will try again later.  Tammy, RN updated.

## 2022-04-09 NOTE — Progress Notes (Signed)
PROGRESS NOTE        PATIENT DETAILS Name: Sergio [name] Age: 88 y.o. Sex: male Date of Birth: 1933/04/06 Admit Date: 03/27/2022 Admitting Physician Sergio Mura, DO Sergio [name], Sergio Solomons, MD  Brief Summary: Patient is a 88 y.o.  male with history of DM-2, HTN, CKD stage IIIb, chronic HFpEF, suspected dementia/cognitive dysfunction at baseline-presented with confusion-found to have severe hyponatremia.  Significant events: 6/30>> AMS-hypernatremia-admit to [facility]  Significant studies: 6/30>> TSH: 4.8 (minimally elevated)-FT4: Normal 6/30>> NH 4: 53 6/30>> CXR: No PNA 6/30>> CT head: No acute findings 6/30>> CT abdomen/pelvis: No hydronephrosis-cholelithiasis without inflammatory change-enlarged prostate. 7/01>> Echo: EF 65-70%, no regional wall motion abnormality. 7/04>> acute hepatitis serology: Negative 7/06>> vitamin B12: 662 7/06>> RPR: Reactive 7/06>> serum T. pallidum AB: Reactive 7/10>> CSF VDRL: Nonreactive. 7/13>> MRI brain: No acute abnormalities.   Significant microbiology data: 6/30>> blood culture: Aerococcus urinae in 1/2  6/30>> urine culture: 60,000 colonies of Proteus/100,000 colonies of Aerococcus 6/30>> COVID/influenza PCR: Negative 7/6>> blood culture: Staph capitis (contamination) 7/8>> blood culture: No growth 7/10>> CSF culture: No growth 7/10>> CSF meningitis/encephalitis panel: Negative   Procedures: 7/10>> fluoroscopy-guided LP (WBC 1, protein 95)  Consults: ID  Subjective: Remains unchanged-pleasantly confused.  Objective: Vitals: Blood pressure (!) 133/51, pulse 61, temperature 98.2 F (36.8 C), temperature source Oral, resp. rate 17, height '5\' 5"'$  (1.651 m), weight 57.2 kg, SpO2 100 %.   Exam: Gen Exam:not in any distress HEENT:atraumatic, normocephalic Chest: B/L clear to auscultation anteriorly CVS:S1S2 regular Abdomen:soft non tender, non distended Extremities:no edema Neurology: Generalized  weakness but moving all 4 extremities. Skin: no rash   Pertinent Labs/Radiology:    Latest Ref Rng & Units 04/08/2022   12:59 AM 04/06/2022    1:53 AM 04/01/2022    1:04 AM  CBC  WBC 4.0 - 10.5 K/uL 9.5  10.4  11.1   Hemoglobin 13.0 - 17.0 g/dL 9.0  8.3  9.7   Hematocrit 39.0 - 52.0 % 28.1  26.1  30.0   Platelets 150 - 400 K/uL 342  329  137     Lab Results  Component Value Date   NA 138 04/08/2022   K 3.9 04/08/2022   CL 107 04/08/2022   CO2 24 04/08/2022      Assessment/Plan: Acute metabolic encephalopathy: Due to a combination of AKI/hypernatremia-suspect may have some amount of cognitive dysfunction at baseline (see below).   Hypernatremia: Resolved with hydration and other supportive care.  Follow electrolytes periodically.   AKI on CKD stage IIIb: AKI hemodynamically mediated-improved-creatinine at baseline.  Non-STEMI: Given advanced dementia/AKI-managed medically-no wall motion abnormality on echo-EF preserved-continue aspirin//beta-blocker/statin (now that his LFTs are significantly better).Discussed with cardiologist-Dr. Croitoru on 7/6-who reviewed chart-recommendations for outpatient nuclear stress test once he has recovered from his acute illness.  Severe sepsis due to Aerococcus bacteremia/UTI: Febrile to 101 on initial presentation-sepsis physiology has resolved-ID following-now on penicillin G until 7/18.  Proteus UTI: Likely a contamination   Reactive RPR titer: Negative CSF VDRL-doubt neurosyphilis-already on IV penicillin G.-with positive treponema antibody-rule out neurosyphilis  Transaminitis: Probably multifactorial in the setting of myolysis/sepsis-repeat liver enzymes periodically.  Acute hepatitis serology negative.  Rhabdomyolysis: Due to likely being down-CK levels downtrending-follow periodically.  Normocytic anemia: Due to acute illness-no evidence of blood loss  HTN: BP relatively well controlled-continue metoprolol and amlodipine.   Follow and  optimize.  DM-2 (A1c 6.3 on 6/30): CBG stable with SSI  Recent Labs    04/09/22 0022 04/09/22 0756 04/09/22 1156  GLUCAP 128* 146* 99     BPH: Per nursing staff-never required Foley catheter insertion this admission-on Flomax-monitor with periodic bladder scans.   ?  Chronic cognitive dysfunction: See above-we will require outpatient neurology follow-up.  Patient's protein in CSF was significantly elevated-since CSF VDRL negative-I discussed case with neurologist-Dr. Bronson [name] recommended brain MRI which was done which did not show any acute abnormalities.  Subsequent discussion with Dr Sergio [name] post MRI brain-recommendations were to continue supportive care and not pursue any further work-up.  May be elevated protein was a reactive to bacteremia and his underlying infectious issues.  Pressure Ulcer: Pressure Injury 03/28/22 Hip Left;Lateral Stage 2 -  Partial thickness loss of dermis presenting as a shallow open injury with a red, pink wound bed without slough. 2 L hip wounds (Active)  03/28/22 1400  Location: Hip  Location Orientation: Left;Lateral  Staging: Stage 2 -  Partial thickness loss of dermis presenting as a shallow open injury with a red, pink wound bed without slough.  Wound Description (Comments): 2 L hip wounds  Present on Admission: Yes  Dressing Type Foam - Lift dressing to assess site every shift 04/08/22 1901    BMI Estimated body mass index is 20.98 kg/m as calculated from the following:   Height as of this encounter: '5\' 5"'$  (1.651 m).   Weight as of this encounter: 57.2 kg.   Code status:   Code Status: Full Code   DVT Prophylaxis: SCDs Start: 03/27/22 2044    Family Communication: Son-Sergio [name]-[phone]-left voicemail on 7/13.   Disposition Plan: Status is: Inpatient Remains inpatient appropriate because: Awaiting SNF bed.   Planned Discharge Destination:Skilled nursing facility likely on 7/14.   Diet: Diet Order             DIET DYS 3 Room  service appropriate? No; Fluid consistency: Thin  Diet effective now                     Antimicrobial agents: Anti-infectives (From admission, onward)    Start     Dose/Rate Route Frequency Ordered Stop   04/06/22 1500  penicillin G potassium 6 Million Units in dextrose 5 % 500 mL continuous infusion        6 Million Units 41.7 mL/hr over 12 Hours Intravenous Every 12 hours 04/06/22 1404 04/14/22 2359   04/04/22 1345  vancomycin (VANCOCIN) IVPB 1000 mg/200 mL premix  Status:  Discontinued        1,000 mg 200 mL/hr over 60 Minutes Intravenous Every 36 hours 04/04/22 1247 04/06/22 1404   04/01/22 1800  cefTRIAXone (ROCEPHIN) 2 g in sodium chloride 0.9 % 100 mL IVPB  Status:  Discontinued        2 g 200 mL/hr over 30 Minutes Intravenous Every 24 hours 04/01/22 1205 04/06/22 1404   03/27/22 1715  cefTRIAXone (ROCEPHIN) 1 g in sodium chloride 0.9 % 100 mL IVPB  Status:  Discontinued        1 g 200 mL/hr over 30 Minutes Intravenous Every 24 hours 03/27/22 1715 04/01/22 1205   03/27/22 1715  vancomycin (VANCOREADY) IVPB 1250 mg/250 mL        1,250 mg 166.7 mL/hr over 90 Minutes Intravenous  Once 03/27/22 1715 03/27/22 2048        MEDICATIONS: Scheduled Meds:  amLODipine  5 mg Oral Daily   aspirin  81 mg  Oral Daily   atorvastatin  40 mg Oral Daily   Gerhardt's butt cream   Topical BID   hydrocerin   Topical BID   insulin aspart  0-9 Units Subcutaneous TID AC & HS   metoprolol tartrate  25 mg Oral BID   tamsulosin  0.4 mg Oral Daily   Continuous Infusions:  sodium chloride Stopped (04/04/22 2120)   penicillin G potassium 6 Million Units in dextrose 5 % 500 mL continuous infusion Stopped (04/09/22 0900)   PRN Meds:.sodium chloride, acetaminophen **OR** acetaminophen, lip balm, mouth rinse   I have personally reviewed following labs and imaging studies  LABORATORY DATA: CBC: Recent Labs  Lab 04/06/22 0153 04/08/22 0059  WBC 10.4 9.5  HGB 8.3* 9.0*  HCT 26.1* 28.1*   MCV 85.9 85.2  PLT 329 342     Basic Metabolic Panel: Recent Labs  Lab 04/04/22 0447 04/06/22 0153 04/08/22 0059 04/08/22 1001  NA 143 140 136 138  K 4.2 3.9 4.0 3.9  CL 114* 109 107 107  CO2 '22 23 23 24  '$ GLUCOSE 126* 105* 101* 121*  BUN 19 23 26* 24*  CREATININE 1.48* 1.61* 1.61* 1.51*  CALCIUM 7.9* 8.0* 7.8* 8.1*  MG  --   --   --  2.5*     GFR: Estimated Creatinine Clearance: 27.9 mL/min (A) (by C-G formula based on SCr of 1.51 mg/dL (H)).  Liver Function Tests: Recent Labs  Lab 04/04/22 0447  AST 38  ALT 82*  ALKPHOS 44  BILITOT 0.5  PROT 6.1*  ALBUMIN 1.6*    No results for input(s): "LIPASE", "AMYLASE" in the last 168 hours. No results for input(s): "AMMONIA" in the last 168 hours.   Coagulation Profile: Recent Labs  Lab 04/03/22 1153  INR 1.2     Cardiac Enzymes: No results for input(s): "CKTOTAL", "CKMB", "CKMBINDEX", "TROPONINI" in the last 168 hours.   BNP (last 3 results) No results for input(s): "PROBNP" in the last 8760 hours.  Lipid Profile: No results for input(s): "CHOL", "HDL", "LDLCALC", "TRIG", "CHOLHDL", "LDLDIRECT" in the last 72 hours.  Thyroid Function Tests: No results for input(s): "TSH", "T4TOTAL", "FREET4", "T3FREE", "THYROIDAB" in the last 72 hours.  Anemia Panel: No results for input(s): "VITAMINB12", "FOLATE", "FERRITIN", "TIBC", "IRON", "RETICCTPCT" in the last 72 hours.   Urine analysis:    Component Value Date/Time   COLORURINE AMBER (A) 03/27/2022 2030   APPEARANCEUR CLOUDY (A) 03/27/2022 2030   LABSPEC 1.013 03/27/2022 2030   PHURINE 8.0 03/27/2022 2030   GLUCOSEU NEGATIVE 03/27/2022 2030   HGBUR MODERATE (A) 03/27/2022 2030   BILIRUBINUR NEGATIVE 03/27/2022 2030   BILIRUBINUR negative 10/01/2017 0901   BILIRUBINUR neg 04/02/2014 1605   KETONESUR NEGATIVE 03/27/2022 2030   PROTEINUR 100 (A) 03/27/2022 2030   UROBILINOGEN 0.2 10/01/2017 0901   UROBILINOGEN 0.2 02/12/2015 2205   NITRITE NEGATIVE  03/27/2022 2030   LEUKOCYTESUR SMALL (A) 03/27/2022 2030    Sepsis Labs: Lactic Acid, Venous    Component Value Date/Time   LATICACIDVEN 1.9 03/28/2022 0607    MICROBIOLOGY: Recent Results (from the past 240 hour(s))  Culture, blood (Routine X 2) w Reflex to ID Panel     Status: Abnormal   Collection Time: 04/02/22 11:53 AM   Specimen: BLOOD  Result Value Ref Range Status   Specimen Description BLOOD RIGHT ANTECUBITAL  Final   Special Requests   Final    IN BOTH AEROBIC AND ANAEROBIC BOTTLES Blood Culture adequate volume   Culture  Setup Time  Final    GRAM POSITIVE COCCI IN CLUSTERS IN BOTH AEROBIC AND ANAEROBIC BOTTLES CRITICAL RESULT CALLED TO, READ BACK BY AND VERIFIED WITH: Acacia Villas. [id number] FCP Performed at [facility], [place] [address] [place], [place] [address]    Culture STAPHYLOCOCCUS CAPITIS (A)  Final   Report Status 04/06/2022 FINAL  Final   Organism ID, Bacteria STAPHYLOCOCCUS CAPITIS  Final      Susceptibility   Staphylococcus capitis - MIC*    CIPROFLOXACIN <=0.5 SENSITIVE Sensitive     ERYTHROMYCIN <=0.25 SENSITIVE Sensitive     GENTAMICIN <=0.5 SENSITIVE Sensitive     OXACILLIN RESISTANT Resistant     TETRACYCLINE <=1 SENSITIVE Sensitive     VANCOMYCIN <=0.5 SENSITIVE Sensitive     TRIMETH/SULFA <=10 SENSITIVE Sensitive     CLINDAMYCIN <=0.25 SENSITIVE Sensitive     RIFAMPIN <=0.5 SENSITIVE Sensitive     Inducible Clindamycin NEGATIVE Sensitive     * STAPHYLOCOCCUS CAPITIS  Culture, blood (Routine X 2) w Reflex to ID Panel     Status: Abnormal   Collection Time: 04/02/22 11:58 AM   Specimen: BLOOD  Result Value Ref Range Status   Specimen Description BLOOD LEFT ANTECUBITAL  Final   Special Requests   Final    BOTTLES DRAWN AEROBIC AND ANAEROBIC Blood Culture adequate volume   Culture  Setup Time   Final    GRAM POSITIVE COCCI IN PAIRS AEROBIC BOTTLE ONLY CRITICAL VALUE NOTED.  VALUE IS CONSISTENT WITH PREVIOUSLY REPORTED AND  CALLED VALUE. Performed at [facility], [place] [address] [place], [place] [address]    Culture STAPHYLOCOCCUS CAPITIS (A)  Final   Report Status 04/06/2022 FINAL  Final   Organism ID, Bacteria STAPHYLOCOCCUS CAPITIS  Final      Susceptibility   Staphylococcus capitis - MIC*    CIPROFLOXACIN <=0.5 SENSITIVE Sensitive     ERYTHROMYCIN <=0.25 SENSITIVE Sensitive     GENTAMICIN <=0.5 SENSITIVE Sensitive     OXACILLIN RESISTANT Resistant     TETRACYCLINE <=1 SENSITIVE Sensitive     VANCOMYCIN 1 SENSITIVE Sensitive     TRIMETH/SULFA <=10 SENSITIVE Sensitive     CLINDAMYCIN <=0.25 SENSITIVE Sensitive     RIFAMPIN <=0.5 SENSITIVE Sensitive     Inducible Clindamycin NEGATIVE Sensitive     * STAPHYLOCOCCUS CAPITIS  Culture, blood (Routine X 2) w Reflex to ID Panel     Status: None   Collection Time: 04/04/22  4:40 AM   Specimen: BLOOD  Result Value Ref Range Status   Specimen Description BLOOD RIGHT ANTECUBITAL  Final   Special Requests   Final    BOTTLES DRAWN AEROBIC AND ANAEROBIC Blood Culture adequate volume   Culture   Final    NO GROWTH 5 DAYS Performed at [facility], [address] [place], [place] [address]    Report Status 04/09/2022 FINAL  Final  Culture, blood (Routine X 2) w Reflex to ID Panel     Status: None   Collection Time: 04/04/22  4:46 AM   Specimen: BLOOD  Result Value Ref Range Status   Specimen Description BLOOD LEFT ANTECUBITAL  Final   Special Requests   Final    BOTTLES DRAWN AEROBIC AND ANAEROBIC Blood Culture adequate volume   Culture   Final    NO GROWTH 5 DAYS Performed at [facility], [place] [address] [place], [place] [address]    Report Status 04/09/2022 FINAL  Final  MRSA Next Gen by  PCR, Nasal     Status: None   Collection Time: 04/04/22 12:41 PM   Specimen: Nasal Mucosa; Nasal Swab  Result Value Ref Range Status   MRSA by PCR Next Gen NOT DETECTED NOT DETECTED Final    Comment: (NOTE) The GeneXpert MRSA Assay (FDA approved  for NASAL specimens only), is one component of a comprehensive MRSA colonization surveillance program. It is not intended to diagnose MRSA infection nor to guide or monitor treatment for MRSA infections. Test performance is not FDA approved in patients less than 79 years old. Performed at [facility], [place] [address] [place], [place] [address]   CSF culture w Gram Stain     Status: None   Collection Time: 04/06/22 12:26 PM   Specimen: PATH Cytology CSF; Cerebrospinal Fluid  Result Value Ref Range Status   Specimen Description CSF  Final   Special Requests NONE  Final   Gram Stain NO WBC SEEN NO ORGANISMS SEEN CYTOSPIN SMEAR   Final   Culture   Final    NO GROWTH 3 DAYS Performed at [facility], [address], [place], [place] [address]    Report Status 04/09/2022 FINAL  Final    RADIOLOGY STUDIES/RESULTS: MR BRAIN WO CONTRAST  Result Date: 04/09/2022 CLINICAL DATA:  88 year old male with altered mental status. Possible metabolic encephalopathy. EXAM: MRI HEAD WITHOUT CONTRAST TECHNIQUE: Multiplanar, multiecho pulse sequences of the brain and surrounding structures were obtained without intravenous contrast. COMPARISON:  Head CT 03/27/2022 and earlier. FINDINGS: Brain: No restricted diffusion or evidence of acute infarction. 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 Ex vacuo appearing enlargement of the frontal horns with confluent bilateral periventricular white matter T2 and FLAIR hyperintensity in a nonspecific pattern (series 7, image 19). Additionally there is confluent trefoil shaped T2 and FLAIR hyperintensity in the brainstem at the pons on series 6 image 8. Suggestion of a nearby chronic pontine microhemorrhage on series 8, image 29. No disproportionate cerebellar atrophy. Pronounced mineralization in the deep gray matter nuclei also on SWI imaging, and evidence of small chronic microhemorrhages in the left thalamus, right occipital lobe (series 8,  image 44), left cerebellum (image 16). No cortical encephalomalacia identified. Mild T2 heterogeneity in the bilateral deep gray nuclei. Vascular: Major intracranial vascular flow voids are preserved. Right vertebral V4 segment appears dominant. Skull and upper cervical spine: Visualized bone marrow signal is within normal limits. Chronic cervical disc and endplate degeneration with borderline to mild visible cervical spinal stenosis. Sinuses/Orbits: Postoperative changes to the left globe. Paranasal Visualized paranasal sinuses and mastoids are stable and well aerated. Other: Visible internal auditory structures appear normal. Negative visible scalp and face. IMPRESSION: 1. No acute intracranial abnormality. 2. Evidence of some chronic small vessel disease, including several chronic microhemorrhages and occasional small chronic infarcts in the right cerebellum. Conspicuous but nonspecific T2/FLAIR signal changes in the periventricular white matter and the pons, with some associated brain volume loss. But lack of disproportionate cerebellar atrophy argues against multiple system atrophy. Electronically Signed   By: Genevie Ann M.D.   On: 04/09/2022 08:58     LOS: 13 days   Oren Binet, MD  [facility]    To contact the attending provider between 7A-7P or the covering provider during after hours 7P-7A, please log into the web site [email or web address] and access using universal [place] password for that web site. If you do not have the password, please call the hospital operator.  04/09/2022, 1:18 PM

## 2022-04-10 DIAGNOSIS — G9340 Encephalopathy, unspecified: Secondary | ICD-10-CM | POA: Diagnosis not present

## 2022-04-10 DIAGNOSIS — E870 Hyperosmolality and hypernatremia: Secondary | ICD-10-CM | POA: Diagnosis not present

## 2022-04-10 DIAGNOSIS — E119 Type 2 diabetes mellitus without complications: Secondary | ICD-10-CM

## 2022-04-10 DIAGNOSIS — I214 Non-ST elevation (NSTEMI) myocardial infarction: Secondary | ICD-10-CM | POA: Diagnosis not present

## 2022-04-10 DIAGNOSIS — N179 Acute kidney failure, unspecified: Secondary | ICD-10-CM | POA: Diagnosis not present

## 2022-04-10 MED ORDER — SODIUM CHLORIDE 0.9% FLUSH
10.0000 mL | Freq: Two times a day (BID) | INTRAVENOUS | Status: DC
  Administered 2022-04-10: 10 mL

## 2022-04-10 MED ORDER — CHLORHEXIDINE GLUCONATE CLOTH 2 % EX PADS
6.0000 | MEDICATED_PAD | Freq: Every day | CUTANEOUS | Status: DC
  Administered 2022-04-10: 6 via TOPICAL

## 2022-04-10 MED ORDER — ASPIRIN 81 MG PO CHEW: 81.0000 mg | CHEWABLE_TABLET | Freq: Every day | ORAL | Status: AC

## 2022-04-10 MED ORDER — METOPROLOL TARTRATE 25 MG PO TABS: 25.0000 mg | ORAL_TABLET | Freq: Two times a day (BID) | ORAL | Status: AC

## 2022-04-10 MED ORDER — SODIUM CHLORIDE 0.9% FLUSH: 10.0000 mL | INTRAVENOUS | Status: DC | PRN

## 2022-04-10 MED ORDER — AMLODIPINE BESYLATE 5 MG PO TABS: 5.0000 mg | ORAL_TABLET | Freq: Every day | ORAL | 1 refills | Status: AC

## 2022-04-10 MED ORDER — INSULIN ASPART 100 UNIT/ML IJ SOLN: INTRAMUSCULAR | 11 refills | Status: AC

## 2022-04-10 MED ORDER — ATORVASTATIN CALCIUM 40 MG PO TABS: 40.0000 mg | ORAL_TABLET | Freq: Every day | ORAL | Status: AC

## 2022-04-10 MED ORDER — PENICILLIN G POTASSIUM IV (FOR PTA / DISCHARGE USE ONLY): 12.0000 10*6.[IU] | INTRAVENOUS | 0 refills | Status: AC

## 2022-04-10 NOTE — Progress Notes (Signed)
Peripherally Inserted Central Catheter Placement  The IV Nurse has discussed with the patient and/or persons authorized to consent for the patient, the purpose of this procedure and the potential benefits and risks involved with this procedure.  The benefits include less needle sticks, lab draws from the catheter, and the patient may be discharged home with the catheter. Risks include, but not limited to, infection, bleeding, blood clot (thrombus formation), and puncture of an artery; nerve damage and irregular heartbeat and possibility to perform a PICC exchange if needed/ordered by physician.  Alternatives to this procedure were also discussed.  Bard Power PICC patient education guide, fact sheet on infection prevention and patient information card has been provided to patient /or left at bedside.    PICC Placement Documentation  PICC Single Lumen 04/10/22 Right Brachial 34 cm 0 cm (Active)  Indication for Insertion or Continuance of Line Home intravenous therapies (PICC only) 04/10/22 0821  Exposed Catheter (cm) 0 cm 04/10/22 0821  Site Assessment Clean, Dry, Intact 04/10/22 0821  Line Status Flushed;Saline locked;Blood return noted 04/10/22 0821  Dressing Type Transparent;Securing device 04/10/22 1791  Dressing Status Antimicrobial disc in place;Clean, Dry, Intact 04/10/22 0821  Line Adjustment (NICU/IV Team Only) No 04/10/22 0821  Dressing Intervention Other (Comment) 04/10/22 0821  Dressing Change Due 04/17/22 04/10/22 0821    Patient's son, Antavius Sperbeck, signed consent via telephone. Verified by 2 PICC RNs.    Enos Fling 04/10/2022, 8:23 AM

## 2022-04-10 NOTE — Progress Notes (Signed)
[facility] called back. Report has been given.

## 2022-04-10 NOTE — Progress Notes (Signed)
Attempted to contact son and inquire about his availability to follow up with staff and given informed consent for PICC line, no answer to both numbers listed in chart. SR, RN

## 2022-04-10 NOTE — Discharge Summary (Signed)
PATIENT DETAILS Name: Sergio [name] Age: 88 y.o. Sex: male Date of Birth: August 25, 1933 MRN: [id number]. Admitting Physician: Rhetta Mura, DO TGG:YIRSWNIOE, Arlie Solomons, MD  Admit Date: 03/27/2022 Discharge date: 04/10/2022  Recommendations for Outpatient Follow-up:  Follow up with PCP in 1-2 weeks Please obtain CMP/CBC in one week Continue IV penicillin until 7/18, then needs 1 dose of PEN 2.4 MU IM on 7/19-and then another dose of PEN 2.4 MU on 7/26 Please ensure outpatient follow-up with cardiology Please ensure outpatient follow-up with neurology  Admitted From:  Home  Disposition: Skilled nursing facility   Discharge Condition: fair  CODE STATUS:   Code Status: Full Code   Diet recommendation:  Diet Order             Diet - low sodium heart healthy           Diet Carb Modified           DIET DYS 3 Room service appropriate? No; Fluid consistency: Thin  Diet effective now                    Brief Summary: Patient is a 88 y.o.  male with history of DM-2, HTN, CKD stage IIIb, chronic HFpEF, suspected dementia/cognitive dysfunction at baseline-presented with confusion-found to have severe hyponatremia.   Significant events: 6/30>> AMS-hypernatremia-admit to [facility]   Significant studies: 6/30>> TSH: 4.8 (minimally elevated)-FT4: Normal 6/30>> NH 4: 53 6/30>> CXR: No PNA 6/30>> CT head: No acute findings 6/30>> CT abdomen/pelvis: No hydronephrosis-cholelithiasis without inflammatory change-enlarged prostate. 7/01>> Echo: EF 65-70%, no regional wall motion abnormality. 7/04>> acute hepatitis serology: Negative 7/06>> vitamin B12: 662 7/06>> RPR: Reactive 7/06>> serum T. pallidum AB: Reactive 7/10>> CSF VDRL: Nonreactive. 7/13>> MRI brain: No acute abnormalities.     Significant microbiology data: 6/30>> blood culture: Aerococcus urinae in 1/2  6/30>> urine culture: 60,000 colonies of Proteus/100,000 colonies of Aerococcus 6/30>> COVID/influenza PCR:  Negative 7/6>> blood culture: Staph capitis (contamination) 7/8>> blood culture: No growth 7/10>> CSF culture: No growth 7/10>> CSF meningitis/encephalitis panel: Negative     Procedures: 7/10>> fluoroscopy-guided LP (WBC 1, protein 95)   Consults: ID  Brief Hospital Course: Acute metabolic encephalopathy: Due to a combination of AKI/hypernatremia-suspect may have some amount of cognitive dysfunction at baseline (see below).    Hypernatremia: Resolved with hydration and other supportive care.  Follow electrolytes periodically.    AKI on CKD stage IIIb: AKI hemodynamically mediated-improved-creatinine at baseline.   Non-STEMI: Given advanced dementia/AKI-managed medically-no wall motion abnormality on echo-EF preserved-continue aspirin//beta-blocker/statin (now that his LFTs are significantly better).Discussed with cardiologist-Dr. Croitoru on 7/6-who reviewed chart-recommendations for outpatient nuclear stress test once he has recovered from his acute illness.   Severe sepsis due to Aerococcus bacteremia/UTI: Febrile to 101 on initial presentation-sepsis physiology has resolved-ID following-now on penicillin G until 7/18.   Proteus UTI: Likely a contamination-but treated in any event as patient was on multiple antibiotics for other infectious issues.   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  Per ID-once patient completes IV penicillin G-and then needs 1 dose of PEN 2.4 MU IM on 7/19-and then another dose of PEN 2.4 MU on 7/26   Transaminitis: Probably multifactorial in the setting of myolysis/sepsis-repeat liver enzymes periodically.  Acute hepatitis serology negative.   Rhabdomyolysis: Due to likely being down-CK levels down trended significantly and subsequently normalized.  This was managed with IVF and supportive care.   Normocytic anemia: Due to  acute illness-no evidence of blood loss   HTN: BP  relatively well controlled-continue metoprolol and amlodipine.   Follow and optimize.     DM-2 (A1c 6.3 on 6/30): CBG stable with SSI  BPH: Per nursing staff-never required Foley catheter insertion this admission-on Flomax-monitor with periodic bladder scans.   ?  Chronic cognitive dysfunction: See above-we will require outpatient neurology follow-up.  Patient's protein in CSF was significantly elevated-since CSF VDRL negative-I discussed case with neurologist-Dr. Bronson Curb recommended brain MRI which was done which did not show any acute abnormalities.  Subsequent discussion with Dr Rory Percy post MRI brain-recommendations were to continue supportive care and not pursue any further work-up.  May be elevated protein was a reactive to bacteremia and his underlying infectious issues-and late latent syphilis.  Pressure Ulcer: Pressure Injury 03/28/22 Hip Left;Lateral Stage 2 -  Partial thickness loss of dermis presenting as a shallow open injury with a red, pink wound bed without slough. 2 L hip wounds (Active)  03/28/22 1400  Location: Hip  Location Orientation: Left;Lateral  Staging: Stage 2 -  Partial thickness loss of dermis presenting as a shallow open injury with a red, pink wound bed without slough.  Wound Description (Comments): 2 L hip wounds  Present on Admission: Yes  Dressing Type Foam - Lift dressing to assess site every shift 04/09/22 1050   BMI: Estimated body mass index is 21.09 kg/m as calculated from the following:   Height as of this encounter: 5' 5"  (1.651 m).   Weight as of this encounter: 57.5 kg.    Discharge Diagnoses:  Principal Problem:   Hypernatremia Active Problems:   DM2 (diabetes mellitus, type 2) (HCC)   Acute renal failure superimposed on stage 3b chronic kidney disease (HCC)   Severe sepsis (HCC)   Acute cystitis   Acute metabolic encephalopathy   Acute on chronic urinary retention   NSTEMI (non-ST elevated myocardial infarction) (HCC)   Hypercalcemia    Chronic diastolic CHF (congestive heart failure) (HCC)   Pressure injury of skin   Discharge Instructions:  Activity:  As tolerated with Full fall precautions use walker/cane & assistance as needed   Discharge Instructions     Ambulatory referral to Neurology   Complete by: As directed    An appointment is requested in approximately: 8 weeks   Diet - low sodium heart healthy   Complete by: As directed    Diet Carb Modified   Complete by: As directed    Discharge wound care:   Complete by: As directed    Wound care to left great toe full thickness wound:  Cleanse with soap and water, rinse and dry thoroughly. Paint with povidone iodine swabstick and allow to air dry. When dry, cover with dry gauze dressing and secure with conform bandaging/paper tape.   Home infusion instructions   Complete by: As directed    Instructions: Flushing of vascular access device: 0.9% NaCl pre/post medication administration and prn patency; Heparin 100 u/ml, 68m for implanted ports and Heparin 10u/ml, 56mfor all other central venous catheters.   Increase activity slowly   Complete by: As directed       Allergies as of 04/10/2022   No Known Allergies      Medication List     STOP taking these medications    apixaban 5 MG Tabs tablet Commonly known as: ELIQUIS   hydrALAZINE 25 MG tablet Commonly known as: APRESOLINE   ondansetron 4 MG tablet Commonly known as: ZOFRAN   tamsulosin 0.4  MG Caps capsule Commonly known as: FLOMAX   traMADol 50 MG tablet Commonly known as: ULTRAM       TAKE these medications    amLODipine 5 MG tablet Commonly known as: NORVASC Take 1 tablet (5 mg total) by mouth daily. What changed:  medication strength how much to take   aspirin 81 MG chewable tablet Chew 1 tablet (81 mg total) by mouth daily.   atorvastatin 40 MG tablet Commonly known as: LIPITOR Take 1 tablet (40 mg total) by mouth daily.   insulin aspart 100 UNIT/ML injection Commonly  known as: novoLOG 0-9 Units, Subcutaneous, 3 times daily before meals & bedtime,CBG < 70: Implement Hypoglycemia Standing Orders and refer to Hypoglycemia Standing Orders sidebar report CBG 70 - 120: 0 units CBG 121 - 150: 1 unit CBG 151 - 200: 2 units CBG 201 - 250: 3 units CBG 251 - 300: 5 units CBG 301 - 350: 7 units CBG 351 - 400: 9 units CBG > 400: call MD   metoprolol tartrate 25 MG tablet Commonly known as: LOPRESSOR Take 1 tablet (25 mg total) by mouth 2 (two) times daily.   penicillin G  IVPB Inject 12 Million Units into the vein daily for 5 days. As a continuous infusion. Indication:  aerococcus urinae bacteremia First Dose: Yes Last Day of Therapy:  04/14/22 Labs - Once weekly:  CBC/D and BMP, Labs - Every other week:  ESR and CRP Method of administration: Elastomeric (Continuous infusion) Method of administration may be changed at the discretion of home infusion pharmacist based upon assessment of the patient and/or caregiver's ability to self-administer the medication ordered.               Home Infusion Instuctions  (From admission, onward)           Start     Ordered   04/10/22 0000  Home infusion instructions       Question:  Instructions  Answer:  Flushing of vascular access device: 0.9% NaCl pre/post medication administration and prn patency; Heparin 100 u/ml, 54m for implanted ports and Heparin 10u/ml, 547mfor all other central venous catheters.   04/10/22 0958              Discharge Care Instructions  (From admission, onward)           Start     Ordered   04/10/22 0000  Discharge wound care:       Comments: Wound care to left great toe full thickness wound:  Cleanse with soap and water, rinse and dry thoroughly. Paint with povidone iodine swabstick and allow to air dry. When dry, cover with dry gauze dressing and secure with conform bandaging/paper tape.   04/10/22 0958            Contact information for follow-up providers      St[facility] MoronMD. Schedule an appointment as soon as possible for a visit in 1 week(s).   Specialty: Internal Medicine Contact information: [phone]       [facility] Follow up.   Specialty: Neurology Why: Office will call with date/time, If you dont hear from them,please give them a call Contact information: [phone]      [facility] an appointment as soon as possible for a visit in 2 week(s).   Specialty: Cardiology Contact information: [id number]  [address] [place] [phone]             Contact information for after-discharge care     Destination     [facility] .   Service: Skilled Nursing Contact information: [address] [place] [phone]                    No Known Allergies   Other Procedures/Studies: [place] EKG SITE RITE  Result Date: 04/09/2022 If Site Rite image not attached, placement could not be confirmed due to current cardiac rhythm.  MR BRAIN WO CONTRAST  Result Date: 04/09/2022 CLINICAL DATA:  88 year old male with altered mental status. Possible metabolic encephalopathy. EXAM: MRI HEAD WITHOUT CONTRAST TECHNIQUE: Multiplanar, multiecho pulse sequences of the brain and surrounding structures were obtained without intravenous contrast. COMPARISON:  Head CT 03/27/2022 and earlier. FINDINGS: Brain: No restricted diffusion or evidence of acute infarction. 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 Ex vacuo appearing enlargement of the frontal horns with confluent bilateral periventricular white matter T2 and FLAIR hyperintensity in a nonspecific pattern (series 7, image 19). Additionally there is confluent trefoil shaped T2 and FLAIR hyperintensity in the brainstem at the pons  on series 6 image 8. Suggestion of a nearby chronic pontine microhemorrhage on series 8, image 29. No disproportionate cerebellar atrophy. Pronounced mineralization in the deep gray matter nuclei also on SWI imaging, and evidence of small chronic microhemorrhages in the left thalamus, right occipital lobe (series 8, image 44), left cerebellum (image 16). No cortical encephalomalacia identified. Mild T2 heterogeneity in the bilateral deep gray nuclei. Vascular: Major intracranial vascular flow voids are preserved. Right vertebral V4 segment appears dominant. Skull and upper cervical spine: Visualized bone marrow signal is within normal limits. Chronic cervical disc and endplate degeneration with borderline to mild visible cervical spinal stenosis. Sinuses/Orbits: Postoperative changes to the left globe. Paranasal Visualized paranasal sinuses and mastoids are stable and well aerated. Other: Visible internal auditory structures appear normal. Negative visible scalp and face. IMPRESSION: 1. No acute intracranial abnormality. 2. Evidence of some chronic small vessel disease, including several chronic microhemorrhages and occasional small chronic infarcts in the right cerebellum. Conspicuous but nonspecific T2/FLAIR signal changes in the periventricular white matter and the pons, with some associated brain volume loss. But lack of disproportionate cerebellar atrophy argues against multiple system atrophy. Electronically Signed   By: Genevie Ann M.D.   On: 04/09/2022 08:58   DG FL GUIDED LUMBAR PUNCTURE  Result Date: 04/06/2022 CLINICAL DATA:  Encephalopathy, failed bedside attempts EXAM: DIAGNOSTIC LUMBAR PUNCTURE UNDER FLUOROSCOPIC GUIDANCE COMPARISON:  None Available. FLUOROSCOPY: Radiation Exposure Index (as provided by the fluoroscopic device): 3.60 mGy Kerma PROCEDURE: Informed consent was obtained from the patient's family prior to the procedure, including potential complications of headache, allergy, and pain. With  the patient prone, the lower back was prepped with Betadine. 1% Lidocaine was used for local anesthesia. Lumbar puncture was performed at the L5-S1 level using a 20 gauge needle with return of clear CSF with an opening pressure of 19 cm water. 8 ml of CSF were obtained for laboratory studies. The patient tolerated the procedure well and there were no apparent complications. IMPRESSION: Technically successful lumbar puncture as described above. This exam was performed by PA Suezanne Jacquet, and was supervised and interpreted by Audie Pinto, MD. Electronically Signed   By: Audie Pinto M.D.   On: 04/06/2022 13:25   ECHOCARDIOGRAM COMPLETE  Result Date:  03/28/2022    ECHOCARDIOGRAM REPORT   Patient Name:   Sergio [name] Date of Exam: 03/28/2022 Medical Rec #:  [id number]       Height:       65.0 in Accession #:    [id number]      Weight:       145.0 lb Date of Birth:  [birth year]       BSA:          1.725 m Patient Age:    5 years        BP:           161/73 mmHg Patient Gender: M               HR:           65 bpm. Exam Location:  Inpatient Procedure: 2D Echo Indications:    NSTEMI  History:        Patient has no prior history of Echocardiogram examinations,                 most recent 02/04/2017. CHF, chronic kidney disease,                 Signs/Symptoms:Altered Mental Status; Risk Factors:Hypertension,                 Diabetes and Dyslipidemia.  Sonographer:    Johny Chess RDCS Referring Phys: [id number] Rhetta Mura  Sonographer Comments: Image acquisition challenging due to uncooperative patient. IMPRESSIONS  1. Left ventricular ejection fraction, by estimation, is 65 to 70%. The left ventricle has normal function. The left ventricle has no regional wall motion abnormalities. Left ventricular diastolic parameters are indeterminate.  2. Right ventricular systolic function is normal. The right ventricular size is normal. Tricuspid regurgitation signal is inadequate for assessing PA pressure.  3.  The mitral valve is grossly normal. Trivial mitral valve regurgitation.  4. The aortic valve is tricuspid. There is mild calcification of the aortic valve. Aortic valve regurgitation is not visualized.  5. The inferior vena cava is normal in size with greater than 50% respiratory variability, suggesting right atrial pressure of 3 mmHg. Comparison(s): Prior images unable to be directly viewed. FINDINGS  Left Ventricle: Left ventricular ejection fraction, by estimation, is 65 to 70%. The left ventricle has normal function. The left ventricle has no regional wall motion abnormalities. The left ventricular internal cavity size was normal in size. There is  borderline left ventricular hypertrophy. Left ventricular diastolic parameters are indeterminate. Right Ventricle: The right ventricular size is normal. No increase in right ventricular wall thickness. Right ventricular systolic function is normal. Tricuspid regurgitation signal is inadequate for assessing PA pressure. Left Atrium: Left atrial size was normal in size. Right Atrium: Right atrial size was normal in size. Pericardium: The pericardium was not well visualized. Mitral Valve: The mitral valve is grossly normal. Mild mitral annular calcification. Trivial mitral valve regurgitation. Tricuspid Valve: The tricuspid valve is grossly normal. Tricuspid valve regurgitation is trivial. Aortic Valve: The aortic valve is tricuspid. There is mild calcification of the aortic valve. Aortic valve regurgitation is not visualized. Pulmonic Valve: The pulmonic valve was not well visualized. Pulmonic valve regurgitation is trivial. Aorta: The aortic root is normal in size and structure. Venous: The inferior vena cava is normal in size with greater than 50% respiratory variability, suggesting right atrial pressure of 3 mmHg. IAS/Shunts: No atrial level shunt detected by color flow Doppler.  LEFT VENTRICLE PLAX 2D LVIDd:  4.20 cm   Diastology LVIDs:         2.40 cm   LV  e' medial:    5.55 cm/s LV PW:         0.90 cm   LV E/e' medial:  9.5 LV IVS:        1.00 cm   LV e' lateral:   7.40 cm/s LVOT diam:     1.80 cm   LV E/e' lateral: 7.1 LV SV:         49 LV SV Index:   29 LVOT Area:     2.54 cm  RIGHT VENTRICLE             IVC RV S prime:     12.90 cm/s  IVC diam: 1.30 cm TAPSE (M-mode): 1.3 cm LEFT ATRIUM             Index        RIGHT ATRIUM          Index LA diam:        3.30 cm 1.91 cm/m   RA Area:     9.48 cm LA Vol (A2C):   39.7 ml 23.01 ml/m  RA Volume:   20.00 ml 11.59 ml/m LA Vol (A4C):   30.6 ml 17.73 ml/m LA Biplane Vol: 36.4 ml 21.10 ml/m  AORTIC VALVE LVOT Vmax:   91.30 cm/s LVOT Vmean:  61.600 cm/s LVOT VTI:    0.194 m  AORTA Ao Root diam: 3.10 cm MITRAL VALVE MV Area (PHT): 2.48 cm    SHUNTS MV Decel Time: 306 msec    Systemic VTI:  0.19 m MV E velocity: 52.80 cm/s  Systemic Diam: 1.80 cm MV A velocity: 86.60 cm/s MV E/A ratio:  0.61 Rozann Lesches MD Electronically signed by Rozann Lesches MD Signature Date/Time: 03/28/2022/1:28:41 PM    Final    CT ABDOMEN PELVIS WO CONTRAST  Result Date: 03/27/2022 CLINICAL DATA:  Altered mental status EXAM: CT ABDOMEN AND PELVIS WITHOUT CONTRAST TECHNIQUE: Multidetector CT imaging of the abdomen and pelvis was performed following the standard protocol without IV contrast. RADIATION DOSE REDUCTION: This exam was performed according to the departmental dose-optimization program which includes automated exposure control, adjustment of the mA and/or kV according to patient size and/or use of iterative reconstruction technique. COMPARISON:  CT 06/30/2020 FINDINGS: Lower chest: Lung bases demonstrate no acute consolidation or effusion. Hepatobiliary: Multiple gallstones. No focal hepatic abnormality or biliary dilatation Pancreas: Unremarkable. No pancreatic ductal dilatation or surrounding inflammatory changes. Spleen: Curvilinear splenic calcifications as before. Adrenals/Urinary Tract: Adrenals within normal limits.  Kidneys show no hydronephrosis. Mildly prominent left ureter without obstructing stone. Slightly thick-walled appearance of the urinary bladder. Subcentimeter hypodensities within both kidneys too small to further characterize. Stomach/Bowel: The stomach is nonenlarged. No dilated small bowel. No acute bowel wall thickening. Negative appendix. Mild diverticular disease of the left colon. Vascular/Lymphatic: Moderate aortic atherosclerosis. No aneurysm. No suspicious lymph nodes. Reproductive: Prostate is enlarged. Other: Negative for pelvic effusion or free air. Musculoskeletal: Degenerative changes.  No acute osseous abnormality IMPRESSION: 1. Negative for hydronephrosis. Mildly prominent left ureter without obstructing stone. Slightly thick-walled urinary bladder. 2. Cholelithiasis without inflammatory change in the right upper quadrant 3. Enlarged prostate Electronically Signed   By: Donavan Foil M.D.   On: 03/27/2022 20:21   CT Head Wo Contrast  Result Date: 03/27/2022 CLINICAL DATA:  Mental status change, unknown cause EXAM: CT HEAD WITHOUT CONTRAST TECHNIQUE: Contiguous axial images were obtained from the base  of the skull through the vertex without intravenous contrast. RADIATION DOSE REDUCTION: This exam was performed according to the departmental dose-optimization program which includes automated exposure control, adjustment of the mA and/or kV according to patient size and/or use of iterative reconstruction technique. COMPARISON:  08/16/2020 FINDINGS: Brain: No evidence of acute infarction, hemorrhage, hydrocephalus, extra-axial collection or mass lesion/mass effect. Patchy low-density changes within the periventricular and subcortical white matter compatible with chronic microvascular ischemic change. Moderate diffuse cerebral volume loss. Vascular: Atherosclerotic calcifications involving the large vessels of the skull base. No unexpected hyperdense vessel. Skull: Normal. Negative for fracture or  focal lesion. Sinuses/Orbits: No acute finding. Other: None. IMPRESSION: 1. No acute intracranial findings. 2. Chronic microvascular ischemic change and cerebral volume loss. Electronically Signed   By: Davina Poke D.O.   On: 03/27/2022 18:35   DG Chest Port 1 View  Result Date: 03/27/2022 CLINICAL DATA:  Altered mental status. Unresponsive. Question sepsis. EXAM: PORTABLE CHEST 1 VIEW COMPARISON:  None Available. FINDINGS: The heart size is normal. Lungs are clear. Lung volumes are low. Atherosclerotic changes are present at the aortic arch. The visualized soft tissues and bony thorax are unremarkable. IMPRESSION: 1. Low lung volumes. 2. No acute cardiopulmonary disease. Electronically Signed   By: San Morelle M.D.   On: 03/27/2022 17:34     TODAY-DAY OF DISCHARGE:  Subjective:   Sergio [name] today has no headache,no chest abdominal pain,no new weakness tingling or numbness, feels much better wants to go home today.  Objective:   Blood pressure (!) 157/66, pulse 60, temperature 98.5 F (36.9 C), temperature source Oral, resp. rate 16, height 5' 5"  (1.651 m), weight 57.5 kg, SpO2 100 %.  Intake/Output Summary (Last 24 hours) at 04/10/2022 0959 Last data filed at 04/09/2022 1830 Gross per 24 hour  Intake 600 ml  Output --  Net 600 ml   Filed Weights   04/06/22 0023 04/09/22 0500 04/10/22 0500  Weight: 61.3 kg 57.2 kg 57.5 kg    Exam: Awake Alert, Oriented *3, No new F.N deficits, Normal affect [place].AT,PERRAL Supple Neck,No JVD, No cervical lymphadenopathy appriciated.  Symmetrical Chest wall movement, Good air movement bilaterally, CTAB RRR,No Gallops,Rubs or new Murmurs, No Parasternal Heave +ve B.Sounds, Abd Soft, Non tender, No organomegaly appriciated, No rebound -guarding or rigidity. No Cyanosis, Clubbing or edema, No new Rash or bruise   PERTINENT RADIOLOGIC STUDIES: [place] EKG SITE RITE  Result Date: 04/09/2022 If Site Rite image not attached, placement could  not be confirmed due to current cardiac rhythm.  MR BRAIN WO CONTRAST  Result Date: 04/09/2022 CLINICAL DATA:  88 year old male with altered mental status. Possible metabolic encephalopathy. EXAM: MRI HEAD WITHOUT CONTRAST TECHNIQUE: Multiplanar, multiecho pulse sequences of the brain and surrounding structures were obtained without intravenous contrast. COMPARISON:  Head CT 03/27/2022 and earlier. FINDINGS: Brain: No restricted diffusion or evidence of acute infarction. 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 Ex vacuo appearing enlargement of the frontal horns with confluent bilateral periventricular white matter T2 and FLAIR hyperintensity in a nonspecific pattern (series 7, image 19). Additionally there is confluent trefoil shaped T2 and FLAIR hyperintensity in the brainstem at the pons on series 6 image 8. Suggestion of a nearby chronic pontine microhemorrhage on series 8, image 29. No disproportionate cerebellar atrophy. Pronounced mineralization in the deep gray matter nuclei also on SWI imaging, and evidence of small chronic microhemorrhages in the left thalamus, right occipital lobe (series 8, image 44), left cerebellum (image 16). No cortical encephalomalacia identified.  Mild T2 heterogeneity in the bilateral deep gray nuclei. Vascular: Major intracranial vascular flow voids are preserved. Right vertebral V4 segment appears dominant. Skull and upper cervical spine: Visualized bone marrow signal is within normal limits. Chronic cervical disc and endplate degeneration with borderline to mild visible cervical spinal stenosis. Sinuses/Orbits: Postoperative changes to the left globe. Paranasal Visualized paranasal sinuses and mastoids are stable and well aerated. Other: Visible internal auditory structures appear normal. Negative visible scalp and face. IMPRESSION: 1. No acute intracranial abnormality. 2. 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 Conspicuous but nonspecific T2/FLAIR signal changes in the periventricular white matter and the pons, with some associated brain volume loss. But lack of disproportionate cerebellar atrophy argues against multiple system atrophy. Electronically Signed   By: Genevie Ann M.D.   On: 04/09/2022 08:58     PERTINENT LAB RESULTS: CBC: Recent Labs    04/08/22 0059  WBC 9.5  HGB 9.0*  HCT 28.1*  PLT 342   CMET CMP     Component Value Date/Time   NA 138 04/08/2022 1001   NA 143 10/31/2019 1415   K 3.9 04/08/2022 1001   CL 107 04/08/2022 1001   CO2 24 04/08/2022 1001   GLUCOSE 121 (H) 04/08/2022 1001   BUN 24 (H) 04/08/2022 1001   BUN 19 10/31/2019 1415   CREATININE 1.51 (H) 04/08/2022 1001   CREATININE 1.43 (H) 07/04/2014 0948   CALCIUM 8.1 (L) 04/08/2022 1001   PROT 6.1 (L) 04/04/2022 0447   PROT 7.7 10/31/2019 1415   ALBUMIN 1.6 (L) 04/04/2022 0447   ALBUMIN 4.2 10/31/2019 1415   AST 38 04/04/2022 0447   ALT 82 (H) 04/04/2022 0447   ALKPHOS 44 04/04/2022 0447   BILITOT 0.5 04/04/2022 0447   BILITOT 0.4 10/31/2019 1415   GFRNONAA 44 (L) 04/08/2022 1001   GFRNONAA 58 (L) 04/02/2014 1546   GFRAA 18 (L) 07/02/2020 0025   GFRAA 67 04/02/2014 1546    GFR Estimated Creatinine Clearance: 28 mL/min (A) (by C-G formula based on SCr of 1.51 mg/dL (H)). No results for input(s): "LIPASE", "AMYLASE" in the last 72 hours. No results for input(s): "CKTOTAL", "CKMB", "CKMBINDEX", "TROPONINI" in the last 72 hours. Invalid input(s): "POCBNP" No results for input(s): "DDIMER" in the last 72 hours. No results for input(s): "HGBA1C" in the last 72 hours. No results for input(s): "CHOL", "HDL", "LDLCALC", "TRIG", "CHOLHDL", "LDLDIRECT" in the last 72 hours. No results for input(s): "TSH", "T4TOTAL", "T3FREE", "THYROIDAB" in the last 72 hours.  Invalid input(s): "FREET3" No results for input(s): "VITAMINB12", "FOLATE",  "FERRITIN", "TIBC", "IRON", "RETICCTPCT" in the last 72 hours. Coags: No results for input(s): "INR" in the last 72 hours.  Invalid input(s): "PT" Microbiology: Recent Results (from the past 240 hour(s))  Culture, blood (Routine X 2) w Reflex to ID Panel     Status: Abnormal   Collection Time: 04/02/22 11:53 AM   Specimen: BLOOD  Result Value Ref Range Status   Specimen Description BLOOD RIGHT ANTECUBITAL  Final   Special Requests   Final    IN BOTH AEROBIC AND ANAEROBIC BOTTLES Blood Culture adequate volume   Culture  Setup Time   Final    GRAM POSITIVE COCCI IN CLUSTERS IN BOTH AEROBIC AND ANAEROBIC BOTTLES CRITICAL RESULT CALLED TO, READ BACK BY AND VERIFIED WITH: [place]. [id number] FCP Performed at [facility], [place] [address] [place], [place] [address]  Culture STAPHYLOCOCCUS CAPITIS (A)  Final   Report Status 04/06/2022 FINAL  Final   Organism ID, Bacteria STAPHYLOCOCCUS CAPITIS  Final      Susceptibility   Staphylococcus capitis - MIC*    CIPROFLOXACIN <=0.5 SENSITIVE Sensitive     ERYTHROMYCIN <=0.25 SENSITIVE Sensitive     GENTAMICIN <=0.5 SENSITIVE Sensitive     OXACILLIN RESISTANT Resistant     TETRACYCLINE <=1 SENSITIVE Sensitive     VANCOMYCIN <=0.5 SENSITIVE Sensitive     TRIMETH/SULFA <=10 SENSITIVE Sensitive     CLINDAMYCIN <=0.25 SENSITIVE Sensitive     RIFAMPIN <=0.5 SENSITIVE Sensitive     Inducible Clindamycin NEGATIVE Sensitive     * STAPHYLOCOCCUS CAPITIS  Culture, blood (Routine X 2) w Reflex to ID Panel     Status: Abnormal   Collection Time: 04/02/22 11:58 AM   Specimen: BLOOD  Result Value Ref Range Status   Specimen Description BLOOD LEFT ANTECUBITAL  Final   Special Requests   Final    BOTTLES DRAWN AEROBIC AND ANAEROBIC Blood Culture adequate volume   Culture  Setup Time   Final    GRAM POSITIVE COCCI IN PAIRS AEROBIC BOTTLE ONLY CRITICAL VALUE NOTED.  VALUE IS CONSISTENT WITH PREVIOUSLY REPORTED AND CALLED VALUE. Performed  at [facility], [place] [address] [place], [place] [address]    Culture STAPHYLOCOCCUS CAPITIS (A)  Final   Report Status 04/06/2022 FINAL  Final   Organism ID, Bacteria STAPHYLOCOCCUS CAPITIS  Final      Susceptibility   Staphylococcus capitis - MIC*    CIPROFLOXACIN <=0.5 SENSITIVE Sensitive     ERYTHROMYCIN <=0.25 SENSITIVE Sensitive     GENTAMICIN <=0.5 SENSITIVE Sensitive     OXACILLIN RESISTANT Resistant     TETRACYCLINE <=1 SENSITIVE Sensitive     VANCOMYCIN 1 SENSITIVE Sensitive     TRIMETH/SULFA <=10 SENSITIVE Sensitive     CLINDAMYCIN <=0.25 SENSITIVE Sensitive     RIFAMPIN <=0.5 SENSITIVE Sensitive     Inducible Clindamycin NEGATIVE Sensitive     * STAPHYLOCOCCUS CAPITIS  Culture, blood (Routine X 2) w Reflex to ID Panel     Status: None   Collection Time: 04/04/22  4:40 AM   Specimen: BLOOD  Result Value Ref Range Status   Specimen Description BLOOD RIGHT ANTECUBITAL  Final   Special Requests   Final    BOTTLES DRAWN AEROBIC AND ANAEROBIC Blood Culture adequate volume   Culture   Final    NO GROWTH 5 DAYS Performed at [name], [address], [place], [place] [address]    Report Status 04/09/2022 FINAL  Final  Culture, blood (Routine X 2) w Reflex to ID Panel     Status: None   Collection Time: 04/04/22  4:46 AM   Specimen: BLOOD  Result Value Ref Range Status   Specimen Description BLOOD LEFT ANTECUBITAL  Final   Special Requests   Final    BOTTLES DRAWN AEROBIC AND ANAEROBIC Blood Culture adequate volume   Culture   Final    NO GROWTH 5 DAYS Performed at [facility], [place] [address], [place], [place] [address]    Report Status 04/09/2022 FINAL  Final  MRSA Next Gen by PCR, Nasal     Status: None   Collection Time: 04/04/22 12:41 PM   Specimen: Nasal Mucosa; Nasal Swab  Result Value Ref Range Status   MRSA by PCR Next Gen NOT DETECTED NOT DETECTED Final    Comment: (NOTE) The GeneXpert  MRSA Assay (FDA approved for NASAL specimens  only), is one component of a comprehensive MRSA colonization surveillance program. It is not intended to diagnose MRSA infection nor to guide or monitor treatment for MRSA infections. Test performance is not FDA approved in patients less than 91 years old. Performed at [facility], [place] [address] [place], [place] [address]   CSF culture w Gram Stain     Status: None   Collection Time: 04/06/22 12:26 PM   Specimen: PATH Cytology CSF; Cerebrospinal Fluid  Result Value Ref Range Status   Specimen Description CSF  Final   Special Requests NONE  Final   Gram Stain NO WBC SEEN NO ORGANISMS SEEN CYTOSPIN SMEAR   Final   Culture   Final    NO GROWTH 3 DAYS Performed at [facility], [address], [place], [place] [address]    Report Status 04/09/2022 FINAL  Final    FURTHER DISCHARGE INSTRUCTIONS:  Get Medicines reviewed and adjusted: Please take all your medications with you for your next visit with your Primary MD  Laboratory/radiological data: Please request your Primary MD to go over all hospital tests and procedure/radiological results at the follow up, please ask your Primary MD to get all Hospital records sent to his/her office.  In some cases, they will be blood work, cultures and biopsy results pending at the time of your discharge. Please request that your primary care M.D. goes through all the records of your hospital data and follows up on these results.  Also Note the following: If you experience worsening of your admission symptoms, develop shortness of breath, life threatening emergency, suicidal or homicidal thoughts you must seek medical attention immediately by calling 911 or calling your MD immediately  if symptoms less severe.  You must read complete instructions/literature along with all the possible adverse reactions/side effects for all the Medicines you take and that have been prescribed to you. Take any new Medicines after you have completely  understood and accpet all the possible adverse reactions/side effects.   Do not drive when taking Pain medications or sleeping medications (Benzodaizepines)  Do not take more than prescribed Pain, Sleep and Anxiety Medications. It is not advisable to combine anxiety,sleep and pain medications without talking with your primary care practitioner  Special Instructions: If you have smoked or chewed Tobacco  in the last 2 yrs please stop smoking, stop any regular Alcohol  and or any Recreational drug use.  Wear Seat belts while driving.  Please note: You were cared for by a hospitalist during your hospital stay. Once you are discharged, your primary care physician will handle any further medical issues. Please note that NO REFILLS for any discharge medications will be authorized once you are discharged, as it is imperative that you return to your primary care physician (or establish a relationship with a primary care physician if you do not have one) for your post hospital discharge needs so that they can reassess your need for medications and monitor your lab values.  Total Time spent coordinating discharge including counseling, education and face to face time equals greater than 30 minutes.  SignedOren Binet 04/10/2022 9:59 AM

## 2022-04-10 NOTE — Plan of Care (Signed)
  Problem: Education: Goal: Ability to describe self-care measures that may prevent or decrease complications (Diabetes Survival Skills Education) will improve Outcome: Progressing   Problem: Coping: Goal: Ability to adjust to condition or change in health will improve Outcome: Progressing   Problem: Fluid Volume: Goal: Ability to maintain a balanced intake and output will improve Outcome: Progressing   

## 2022-04-10 NOTE — Progress Notes (Signed)
Attempted to give report to receiving nurse at [facility], nurse not available to take report at the time. This nurse's name and number given to [facility] for nurse to call back.   [facility] here to transport pt to [facility] via stretcher. Pt alert and oriented to self upon discharge. Pt did not have any belongings here at hospital. Pt's discharge packet and power picc card given to EMS to transport with pt.

## 2022-04-11 LAB — GLUCOSE, CAPILLARY
Glucose-Capillary: 132 mg/dL — ABNORMAL HIGH (ref 70–99)
Glucose-Capillary: 171 mg/dL — ABNORMAL HIGH (ref 70–99)

## 2022-04-17 ENCOUNTER — Telehealth: Payer: Self-pay

## 2022-04-17 ENCOUNTER — Other Ambulatory Visit: Payer: Self-pay

## 2022-04-17 ENCOUNTER — Inpatient Hospital Stay: Payer: PPO | Admitting: Internal Medicine

## 2022-04-17 ENCOUNTER — Ambulatory Visit (INDEPENDENT_AMBULATORY_CARE_PROVIDER_SITE_OTHER): Payer: PPO | Admitting: Internal Medicine

## 2022-04-17 ENCOUNTER — Encounter: Payer: Self-pay | Admitting: Internal Medicine

## 2022-04-17 VITALS — BP 99/62 | HR 57 | Temp 97.8°F | Resp 16 | Ht 65.0 in | Wt 126.0 lb

## 2022-04-17 DIAGNOSIS — A539 Syphilis, unspecified: Secondary | ICD-10-CM | POA: Diagnosis not present

## 2022-04-17 MED ORDER — PENICILLIN G BENZATHINE 1200000 UNIT/2ML IM SUSY
1.2000 10*6.[IU] | PREFILLED_SYRINGE | Freq: Once | INTRAMUSCULAR | Status: AC
  Administered 2022-04-17: 1.2 10*6.[IU] via INTRAMUSCULAR

## 2022-04-17 NOTE — Telephone Encounter (Signed)
Spoke to [employer] at [facility] to verify Abx. She reports patient has gotten IV PCN daily up until this AM due to appt. Original order was to END 04/14/22 and pull picc as well as administer Bicillin 2.4 million units weekly which was not on med list and could not be verified that it has been given. Patient rcvd one dose today here and will need 2 more. (04/24/22 and 05/01/22)  Ordered CNA from SNF come to 7/28 and 8/4 nurse visit for assistance as it took 3 staff to assist and administer injection in our office that was ordered to be given at SNF.   Ordered PULL PICC for today and called facility to advise.

## 2022-04-17 NOTE — Progress Notes (Signed)
Patient Active Problem List   Diagnosis Date Noted   Pressure injury of skin 03/28/2022   Hypernatremia 03/27/2022   Acute cystitis 91/63/8466   Acute metabolic encephalopathy 59/93/5701   Acute on chronic urinary retention 03/27/2022   NSTEMI (non-ST elevated myocardial infarction) ([place]) 03/27/2022   Hypercalcemia 03/27/2022   Chronic diastolic CHF (congestive heart failure) ([place]) 03/27/2022   Syncope and collapse 03/18/2021   Essential hypertension 08/29/2020   Chronic indwelling Foley catheter 08/19/2020   History of pulmonary embolus (PE) 08/19/2020   Severe sepsis ([place]) 08/17/2020   Sepsis due to urinary tract infection ([place]) 08/16/2020   Acute renal failure superimposed on stage 3b chronic kidney disease ([place]) 06/30/2020   Hydronephrosis 06/30/2020   Pain due to onychomycosis of toenails of both feet 10/13/2019   Urinary frequency 10/01/2017   Type 2 diabetes mellitus without complication, without long-term current use of insulin ([place]) 10/01/2017   Loss of weight 03/24/2017   Hypoglycemia 03/24/2017   Iatrogenic hypotension 03/24/2017   Nonspecific abnormal electrocardiogram (ECG) (EKG) 02/02/2017   Colon polyps 07/04/2014   DM2 (diabetes mellitus, type 2) ([place]) 12/18/2011   Uncontrolled hypertension    Other and unspecified hyperlipidemia     Patient's Medications  New Prescriptions   No medications on file  Previous Medications   AMLODIPINE (NORVASC) 5 MG TABLET    Take 1 tablet (5 mg total) by mouth daily.   ASPIRIN 81 MG CHEWABLE TABLET    Chew 1 tablet (81 mg total) by mouth daily.   ATORVASTATIN (LIPITOR) 40 MG TABLET    Take 1 tablet (40 mg total) by mouth daily.   INSULIN ASPART (NOVOLOG) 100 UNIT/ML INJECTION    0-9 Units, Subcutaneous, 3 times daily before meals & bedtime,CBG < 70: Implement Hypoglycemia Standing Orders and refer to Hypoglycemia Standing Orders sidebar report CBG 70 - 120: 0 units CBG 121 - 150: 1 unit CBG 151 - 200: 2 units CBG  201 - 250: 3 units CBG 251 - 300: 5 units CBG 301 - 350: 7 units CBG 351 - 400: 9 units CBG > 400: call MD   METOPROLOL TARTRATE (LOPRESSOR) 25 MG TABLET    Take 1 tablet (25 mg total) by mouth 2 (two) times daily.   PENICILLIN G POTASSIUM 60000 UNIT/ML    Inject 3 Million Units into the vein every 6 (six) hours.  Modified Medications   No medications on file  Discontinued Medications   No medications on file    Subjective: 88 year old male with past medical history as below presents for hospital follow-up.  He was admitted at [facility] 7/1 - 7/14 with hyponatremia.  Found to have Aerococcus urinae bacteremia secondary to Enterococcus UTI.  TTE showed no vegetation.  TTE was not needed as the episodes and given good clinical response endocarditis unlikely.  Initially treated with ceftriaxone and transitioned to penicillin IV as RPR returned positive(w/u of dementia) with titer 1:2.  I called the [place] and it was noted that this is most consistent RPR with no history of treatment.  Of note during 06/2020 hot elevation patient had concerns of dementia.  LP on 7/10 to work-up neurosyphilis was unremarkable, VDRL/ME panel negative. Plan to complete IV penicillin on 7/18(2 weeks from ceftriaxone 2gm/day for bacteremia ).  Followed byneeds one dose PEN 2.4 MU IM on 7/19 and another dose of PEN 2.4 MU IM on 7/26 to complete treatment for late latent syphilis. Today 04/17/2022:  Patient has PICC in place.  Noted to have last dose of penicillin IV on 7/18 per documentation.  He did not receive pen IM dosing, nursing was called.  No other complaints. Review of Systems: Review of Systems  All other systems reviewed and are negative.   Past Medical History:  Diagnosis Date   Colon polyps 06/22/2014   4 colon polyps by colonoscopy.  Repeat 3 years.  Hung.   Diabetes mellitus    Hypertension    Other and unspecified hyperlipidemia    PE (pulmonary embolism)     Social History    Tobacco Use   Smoking status: Former    Types: Cigars    Quit date: 09/29/1975    Years since quitting: 46.5   Smokeless tobacco: Never  Substance Use Topics   Alcohol use: No   Drug use: No    Family History  Problem Relation Age of Onset   Diabetes Other     No Known Allergies  Health Maintenance  Topic Date Due   TETANUS/TDAP  Never done   Zoster Vaccines- Shingrix (1 of 2) Never done   OPHTHALMOLOGY EXAM  07/24/2015   URINE MICROALBUMIN  12/21/2017   COVID-19 Vaccine (3 - Pfizer series) 02/14/2020   FOOT EXAM  10/12/2020   INFLUENZA VACCINE  04/28/2022   HEMOGLOBIN A1C  09/26/2022   Pneumonia Vaccine 66+ Years old  Completed   HPV VACCINES  Aged Out   COLONOSCOPY (Pts 45-75yr Insurance coverage will need to be confirmed)  Discontinued    Objective:  Vitals:   04/17/22 0852  BP: 99/62  Pulse: (!) 57  Resp: 16  Temp: 97.8 F (36.6 C)  SpO2: 98%  Weight: 126 lb (57.2 kg)  Height: '5\' 5"'$  (1.651 m)   Body mass index is 20.97 kg/m.  Physical Exam Constitutional:      General: He is not in acute distress.    Appearance: He is normal weight. He is not toxic-appearing.  HENT:     Head: Normocephalic and atraumatic.     Right Ear: External ear normal.     Left Ear: External ear normal.     Nose: No congestion or rhinorrhea.     Mouth/Throat:     Mouth: Mucous membranes are moist.     Pharynx: Oropharynx is clear.  Eyes:     Extraocular Movements: Extraocular movements intact.     Conjunctiva/sclera: Conjunctivae normal.     Pupils: Pupils are equal, round, and reactive to light.  Cardiovascular:     Rate and Rhythm: Normal rate and regular rhythm.     Heart sounds: No murmur heard.    No friction rub. No gallop.  Pulmonary:     Effort: Pulmonary effort is normal.     Breath sounds: Normal breath sounds.  Abdominal:     General: Abdomen is flat. Bowel sounds are normal.     Palpations: Abdomen is soft.  Musculoskeletal:        General: No  swelling. Normal range of motion.     Cervical back: Normal range of motion and neck supple.  Skin:    General: Skin is warm and dry.  Neurological:     General: No focal deficit present.     Mental Status: He is oriented to person, place, and time.  Psychiatric:        Mood and Affect: Mood normal.     Lab Results Lab Results  Component Value Date   WBC 9.5 04/08/2022  HGB 9.0 (L) 04/08/2022   HCT 28.1 (L) 04/08/2022   MCV 85.2 04/08/2022   PLT 342 04/08/2022    Lab Results  Component Value Date   CREATININE 1.51 (H) 04/08/2022   BUN 24 (H) 04/08/2022   NA 138 04/08/2022   K 3.9 04/08/2022   CL 107 04/08/2022   CO2 24 04/08/2022    Lab Results  Component Value Date   ALT 82 (H) 04/04/2022   AST 38 04/04/2022   ALKPHOS 44 04/04/2022   BILITOT 0.5 04/04/2022    Lab Results  Component Value Date   CHOL 104 10/01/2017   HDL 41 10/01/2017   LDLCALC 41 10/01/2017   TRIG 109 10/01/2017   CHOLHDL 2.5 10/01/2017   Lab Results  Component Value Date   LABRPR Reactive (A) 04/02/2022   No results found for: "HIV1RNAQUANT", "HIV1RNAVL", "CD4TABS"  89 YM admitted for hypernatremia. Presents for hospital follow-up of aerococcus bacteremia and late latent syphilis   #Aerococcus urinae bacteremia 2/2 Aerococcus UTI -6/301/2 Aerococcus, 7/6 2/2 staph capitus(contamination as repeat Cx NG) -Treated with 2 weeks with ceftriaxone 2gm/day ->PEN IV(7/14-7/19)for bacteremia EOT 7/18  #Late latent syphilis #Concern for dementia  -RPR 1:2 -LP on 7/10 with 1 wbc, 1 rbc, ptn 95, glc 67, csf Cx Ngx2d, VDRL negative, ME panel negative.  -PEN IM was not administered after last dose PEN IV on 7/18(SNFcalled and verified). As such will restart 3 weekly dosing PEN IM for late latent syphilis   #RLE and LLE wounds -Wounds appear chronic, appears to be healing  Recommendations: -Pull PICC -PEN IM x 3 doses -1st dose of PEN 2.4 MU IM   today 7/21.  -2nd dose 7/28(PEN 2.4 MU IM)-  must have CNA at next 2 visits -3rd dose on 8/4(PEN 2.4 MU IM) -Follow-up in weekly for the next 2 weeks for treatment( nurse visit)   I spent more than 65 minutes for this patient encounter including reviewing data/chart, and coordinating care and >50% direct face to face time providing counseling/discussing diagnostics/treatment plan with patient    Laurice Record, [place] [facility] 04/17/2022, 9:00 AM

## 2022-04-17 NOTE — Telephone Encounter (Signed)
Thanks Jamie!

## 2022-04-22 NOTE — Progress Notes (Signed)
Cardiology Clinic Note   Patient Name: Sergio [name] Date of Encounter: 04/24/2022  Primary Care Provider:  Forrest Moron, MD Primary Cardiologist:  Quay Burow, MD  Patient Profile    Sergio [name] 88 year old male presents the clinic today for follow-up evaluation of his hypertension.  Past Medical History    Past Medical History:  Diagnosis Date   Colon polyps 06/22/2014   4 colon polyps by colonoscopy.  Repeat 3 years.  [place].   Diabetes mellitus    Hypertension    Other and unspecified hyperlipidemia    PE (pulmonary embolism)    Past Surgical History:  Procedure Laterality Date   COLONOSCOPY W/ POLYPECTOMY  06/22/2014   4 colon polyps; repeat 3 years; [place].   CYSTOSCOPY WITH URETHRAL DILATATION N/A 04/11/2021   Procedure: CYSTOSCOPY WITH URETHRAL  BALLOON DILATATION;  Surgeon: Robley Fries, MD;  Location: [facility];  Service: Urology;  Laterality: N/A;  77 MINS    Allergies  No Known Allergies  History of Present Illness    Sergio [name] has a PMH of HTN, NSTEMI, chronic diastolic CHF, type 2 diabetes, acute cystitis, weight loss, urinary frequency, hyponatremia, hypercalcemia, PE, and pressure injury of skin.  He was admitted to the hospital on 03/28/2022 and discharged on 04/10/2022.  He was noted to have altered mental status and hyper natremia.  He was admitted to [facility].  Head CT negative for acute findings, abdominal pelvis CT showed no acute changes, echocardiogram showed EF 65-70% with no regional wall motion abnormalities, MRI brain showed no acute abnormalities.  Due to his advanced dementia and AKI with no abnormalities of wall motion and a preserved EF on echocardiogram cardiology recommended outpatient nuclear stress test once he recovered from acute illness.  He was also diagnosed with sepsis due to area of coccus bacterium/UTI.  He was treated with penicillin G until 04/14/2022.  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   His treatment of penicillin was restarted and he was prescribed ceftriaxone for 2 weeks.  He presents to the clinic today for follow-up evaluation and states he has not had any chest pain.  We reviewed his hospitalization and current medications.  We reviewed his echocardiogram.  Due to his advanced dementia I will reach out to Dr. Gwenlyn Found for recommendations on whether or not we will pursue further ischemic evaluation.  We will repeat a BMP again plan follow-up in 3 to 4 months.  Today denies chest pain, shortness of breath, lower extremity edema, fatigue, palpitations, melena, hematuria, hemoptysis, diaphoresis, weakness, presyncope, syncope, orthopnea, and PND.    Home Medications    Prior to Admission medications   Medication Sig Start Date End Date Taking? Authorizing Provider  amLODipine (NORVASC) 5 MG tablet Take 1 tablet (5 mg total) by mouth daily. 04/10/22 06/09/22  Ghimire, Henreitta Leber, MD  aspirin 81 MG chewable tablet Chew 1 tablet (81 mg total) by mouth daily. 04/10/22   Ghimire, Henreitta Leber, MD  atorvastatin (LIPITOR) 40 MG tablet Take 1 tablet (40 mg total) by mouth daily. 04/10/22   Ghimire, Henreitta Leber, MD  insulin aspart (NOVOLOG) 100 UNIT/ML injection 0-9 Units, Subcutaneous, 3 times daily before meals & bedtime,CBG < 70: Implement Hypoglycemia Standing Orders and refer to Hypoglycemia Standing Orders sidebar report CBG 70 - 120: 0 units CBG 121 - 150: 1 unit CBG 151 - 200: 2 units CBG 201 - 250: 3 units CBG 251 - 300: 5 units CBG 301 -  350: 7 units CBG 351 - 400: 9 units CBG > 400: call MD 04/10/22   Jonetta Osgood, MD  metoprolol tartrate (LOPRESSOR) 25 MG tablet Take 1 tablet (25 mg total) by mouth 2 (two) times daily. 04/10/22   Ghimire, Henreitta Leber, MD  penicillin G potassium 60000 UNIT/ML Inject 3 Million Units into the vein every 6 (six) hours.    [provider]    Family History    Family History  Problem Relation Age of Onset   Diabetes Other    He indicated that  his mother is deceased. He indicated that his father is deceased. He indicated that the status of his other is unknown.  Social History    Social History   Socioeconomic History   Marital status: Single    Spouse name: Not on file   Number of children: Not on file   Years of education: Not on file   Highest education level: Not on file  Occupational History   Not on file  Tobacco Use   Smoking status: Former    Types: Cigars    Quit date: 09/29/1975    Years since quitting: 46.6   Smokeless tobacco: Never  Substance and Sexual Activity   Alcohol use: No   Drug use: No   Sexual activity: Not Currently  Other Topics Concern   Not on file  Social History Narrative   Marital status: widowed x 2; married x 22 years first marriage; second marriage x 73.  Not dating.      Children:  3 children; 3 grandchildren; 3 great-grandchildren.      Lives: with son      Employment: retired in 2012; truck Geophysicist/field seismologist      Tobacco: quit in 1977; cigars and pipes      Alcohol: none      Exercise:  Walking 3 days per week in park.      ADLs: drives; walks unassisted; daughter-in-law does laundry; goes to grocery store.   Social Determinants of Health   Financial Resource Strain: Not on file  Food Insecurity: Not on file  Transportation Needs: Not on file  Physical Activity: Not on file  Stress: Not on file  Social Connections: Not on file  Intimate Partner Violence: Not on file     Review of Systems    General:  No chills, fever, night sweats or weight changes.  Cardiovascular:  No chest pain, dyspnea on exertion, edema, orthopnea, palpitations, paroxysmal nocturnal dyspnea. Dermatological: No rash, lesions/masses Respiratory: No cough, dyspnea Urologic: No hematuria, dysuria Abdominal:   No nausea, vomiting, diarrhea, bright red blood per rectum, melena, or hematemesis Neurologic:  No visual changes, wkns, changes in mental status. All other systems reviewed and are otherwise negative  except as noted above.  Physical Exam    VS:  BP 124/62   Pulse 72   Wt 126 lb (57.2 kg)   SpO2 95%   BMI 20.97 kg/m  , BMI Body mass index is 20.97 kg/m. GEN: Well nourished, well developed, in no acute distress. HEENT: normal. Neck: Supple, no JVD, carotid bruits, or masses. Cardiac: RRR, no murmurs, rubs, or gallops. No clubbing, cyanosis, edema.  Radials/DP/PT 2+ and equal bilaterally.  Respiratory:  Respirations regular and unlabored, clear to auscultation bilaterally. GI: Soft, nontender, nondistended, BS + x 4. MS: no deformity or atrophy. Skin: warm and dry, no rash.  Healing lesions noted on right medial thigh and left tibial region Neuro:  Strength and sensation are intact.  Psych: Normal affect.  Accessory Clinical Findings    Recent Labs: 03/27/2022: TSH 4.841 03/28/2022: B Natriuretic Peptide 190.4 04/04/2022: ALT 82 04/08/2022: BUN 24; Creatinine, Ser 1.51; Hemoglobin 9.0; Magnesium 2.5; Platelets 342; Potassium 3.9; Sodium 138   Recent Lipid Panel    Component Value Date/Time   CHOL 104 10/01/2017 0854   TRIG 109 10/01/2017 0854   HDL 41 10/01/2017 0854   CHOLHDL 2.5 10/01/2017 0854   CHOLHDL 3.2 07/04/2014 0948   VLDL 31 07/04/2014 0948   LDLCALC 41 10/01/2017 0854    ECG personally reviewed by me today-none today.  Echocardiogram 03/28/2022  IMPRESSIONS     1. Left ventricular ejection fraction, by estimation, is 65 to 70%. The  left ventricle has normal function. The left ventricle has no regional  wall motion abnormalities. Left ventricular diastolic parameters are  indeterminate.   2. Right ventricular systolic function is normal. The right ventricular  size is normal. Tricuspid regurgitation signal is inadequate for assessing  PA pressure.   3. The mitral valve is grossly normal. Trivial mitral valve  regurgitation.   4. The aortic valve is tricuspid. There is mild calcification of the  aortic valve. Aortic valve regurgitation is not visualized.    5. The inferior vena cava is normal in size with greater than 50%  respiratory variability, suggesting right atrial pressure of 3 mmHg.   Comparison(s): Prior images unable to be directly viewed. Assessment & Plan   1.  Coronary artery disease-high-sensitivity troponins elevated on 03/27/2022 and 03/28/2022.  Patient with severe urosepsis.  Stress testing deferred at that time.  Outpatient nuclear stress test was recommended once patient had recovered from acute illness. Continue aspirin, atorvastatin, amlodipine, metoprolol Heart healthy low-sodium diet- Increase physical activity as tolerated We will reach out to Dr. Gwenlyn Found about moving forward with nuclear stress testing, would recommend deferring ischemic evaluation due to patient's advanced dementia. Order BMP  CKD stage IIIb-creatinine 1.51 on 04/08/2022 Follows with PCP  Essential hypertension-BP today 124/62 Continue metoprolol, amlodipine Heart healthy low-sodium diet-salty 6 given Increase physical activity as tolerated  Type 2 diabetes-A1c 6.3 on 03/27/2022 Continue current medical therapy  Metabolic encephalopathy-has returned to baseline cognitive function.  Poor historian.  Latent syphilis, UTI-has been followed by ID.  Was initially diagnosed with Aerococcus bacterium and treated with penicillin.  Found to have latent syphilis infection.  Was then placed on ceftriaxone and penicillin was restarted. Continue to follow-up with ID  Disposition: Follow-up with Dr. Gwenlyn Found or me in 3-4 months.   Jossie Ng. Eyad Rochford NP-C     04/24/2022, 11:19 AM [address] Office [phone] Fax [phone]  Notice: This dictation was prepared with Dragon dictation along with smaller phrase technology. Any transcriptional errors that result from this process are unintentional and may not be corrected upon review.  I spent 12 minutes examining this patient, reviewing medications, and  using patient centered shared decision making involving her cardiac care.  Prior to her visit I spent greater than 20 minutes reviewing her past medical history,  medications, and prior cardiac tests.

## 2022-04-24 ENCOUNTER — Ambulatory Visit (INDEPENDENT_AMBULATORY_CARE_PROVIDER_SITE_OTHER): Payer: PPO | Admitting: General Practice

## 2022-04-24 ENCOUNTER — Ambulatory Visit (INDEPENDENT_AMBULATORY_CARE_PROVIDER_SITE_OTHER): Payer: PPO

## 2022-04-24 ENCOUNTER — Other Ambulatory Visit: Payer: Self-pay

## 2022-04-24 ENCOUNTER — Encounter: Payer: Self-pay | Admitting: General Practice

## 2022-04-24 VITALS — BP 124/62 | HR 72 | Wt 126.0 lb

## 2022-04-24 DIAGNOSIS — A530 Latent syphilis, unspecified as early or late: Secondary | ICD-10-CM

## 2022-04-24 DIAGNOSIS — I2510 Atherosclerotic heart disease of native coronary artery without angina pectoris: Secondary | ICD-10-CM | POA: Diagnosis not present

## 2022-04-24 DIAGNOSIS — G9341 Metabolic encephalopathy: Secondary | ICD-10-CM

## 2022-04-24 DIAGNOSIS — A539 Syphilis, unspecified: Secondary | ICD-10-CM

## 2022-04-24 DIAGNOSIS — N183 Chronic kidney disease, stage 3 unspecified: Secondary | ICD-10-CM

## 2022-04-24 DIAGNOSIS — E119 Type 2 diabetes mellitus without complications: Secondary | ICD-10-CM

## 2022-04-24 DIAGNOSIS — I10 Essential (primary) hypertension: Secondary | ICD-10-CM

## 2022-04-24 MED ORDER — PENICILLIN G BENZATHINE 1200000 UNIT/2ML IM SUSY
1.2000 10*6.[IU] | PREFILLED_SYRINGE | Freq: Once | INTRAMUSCULAR | Status: AC
  Administered 2022-04-24: 1.2 10*6.[IU] via INTRAMUSCULAR

## 2022-04-24 NOTE — Patient Instructions (Signed)
Medication Instructions:  The current medical regimen is effective;  continue present plan and medications as directed. Please refer to the Current Medication list given to you today.   *If you need a refill on your cardiac medications before your next appointment, please call your pharmacy*  Lab Work:   Testing/Procedures:  BMET TODAY   NONE  If you have labs (blood work) drawn today and your tests are completely normal, you will receive your results only by: [place] (if you have MyChart) OR  A paper copy in the mail If you have any lab test that is abnormal or we need to change your treatment, we will call you to review the results.  Special Instructions PLEASE READ AND FOLLOW SALTY 6-ATTACHED-1,'800mg'$  daily  Follow-Up: Your next appointment:  3-4 month(s) In Person with Quay Burow, MD    At [facility], you and your health needs are our priority.  As part of our continuing mission to provide you with exceptional heart care, we have created designated Provider Care Teams.  These Care Teams include your primary Cardiologist (physician) and Advanced Practice Providers (APPs -  Physician Assistants and Nurse Practitioners) who all work together to provide you with the care you need, when you need it.  We recommend signing up for the patient portal called "MyChart".  Sign up information is provided on this After Visit Summary.  MyChart is used to connect with patients for Virtual Visits (Telemedicine).  Patients are able to view lab/test results, encounter notes, upcoming appointments, etc.  Non-urgent messages can be sent to your provider as well.   To learn more about what you can do with MyChart, go to [email or web address].    Important Information About Sugar             6 SALTY THINGS TO AVOID     1,'800MG'$  DAILY

## 2022-05-01 ENCOUNTER — Other Ambulatory Visit: Payer: Self-pay

## 2022-05-01 ENCOUNTER — Ambulatory Visit (INDEPENDENT_AMBULATORY_CARE_PROVIDER_SITE_OTHER): Payer: PPO

## 2022-05-01 DIAGNOSIS — A539 Syphilis, unspecified: Secondary | ICD-10-CM | POA: Diagnosis not present

## 2022-05-01 MED ORDER — PENICILLIN G BENZATHINE 1200000 UNIT/2ML IM SUSY
1.2000 10*6.[IU] | PREFILLED_SYRINGE | Freq: Once | INTRAMUSCULAR | Status: AC
  Administered 2022-05-01: 1.2 10*6.[IU] via INTRAMUSCULAR

## 2022-06-04 ENCOUNTER — Inpatient Hospital Stay: Payer: PPO | Admitting: Diagnostic Neuroimaging

## 2022-06-15 ENCOUNTER — Non-Acute Institutional Stay: Payer: Self-pay

## 2022-06-15 VITALS — HR 55 | Temp 97.5°F | Ht 60.0 in | Wt 129.3 lb

## 2022-06-15 DIAGNOSIS — Z515 Encounter for palliative care: Secondary | ICD-10-CM

## 2022-06-15 NOTE — Progress Notes (Signed)
PATIENT NAME: Sergio [name] DOB: [birth year] MRN: [id number]  PRIMARY CARE PROVIDER: Forrest Moron, MD  RESPONSIBLE PARTY:  Acct ID - Guarantor Home Phone Work Phone Relationship Acct Type  [id number] Sergio [name], [phone]  Self P/F     [address] [place], [place] [address]   ACP:  Phone call made to son to discuss code status and goals of care.  No answer.  Message left requesting a callback.  Skin:  Skin breakdown noted to left lower extremity being managed by the facility.   Spoke with [place], SW no new concerns regarding patient.  Patient found in the bed with lunch tray in front of him.  Easily awakens to verbal stimulation.  No complaints of pain.  Patient minimally engaged in visit.   Will follow up with son on ACP.    HISTORY OF PRESENT ILLNESS:  88 year old male with a history of chf and DM.  Patient is followed by Palliative Care every 4-8 weeks and PRN.   CODE STATUS: Full ADVANCED DIRECTIVES: N MOST FORM: No PPS: 30%   PHYSICAL EXAM:   VITALS: Today's Vitals   06/15/22 1303  Pulse: (!) 55  Temp: (!) 97.5 F (36.4 C)  SpO2: 98%  Weight: 129 lb 4.8 oz (58.7 kg)  Height: 5' (1.524 m)  PainSc: 0-No pain    LUNGS: clear to auscultation  CARDIAC: Cor RRR}  EXTREMITIES: trace bilateral lower extremity edema. SKIN: Skin color, texture, turgor normal. No rashes or lesions or left lower leg wound.    NEURO: positive for gait problems and memory problems       Lorenza Burton, RN

## 2022-06-23 ENCOUNTER — Ambulatory Visit (INDEPENDENT_AMBULATORY_CARE_PROVIDER_SITE_OTHER): Payer: PPO | Admitting: Vascular Surgery

## 2022-06-23 ENCOUNTER — Encounter: Payer: Self-pay | Admitting: Vascular Surgery

## 2022-06-23 DIAGNOSIS — I70223 Atherosclerosis of native arteries of extremities with rest pain, bilateral legs: Secondary | ICD-10-CM

## 2022-06-23 NOTE — Progress Notes (Signed)
Patient name: Sergio [name] MRN: [id number] DOB: 08/29/1933 Sex: male  REASON FOR CONSULT: Lower extremity arterial wounds with abnormal ABI's  HPI: Sergio [name] is a 88 y.o. male, with hx HTN, CKD, DM that presents for evlauation of arterial wounds to his lower extremities.  Patient is really unable to provide much history.  He is currently at [facility] and according to staff he had these wounds when he arrived about 3 months ago.  He is nonambulatory and does not stand.  He is a two-person transfer requiring complete assistance according to nursing staff.  Unclear if he had previous lower extremity revascularizations given he can provide very little history.  Denies pain.  Past Medical History:  Diagnosis Date   Colon polyps 06/22/2014   4 colon polyps by colonoscopy.  Repeat 3 years.  [place].   Diabetes mellitus    Hypertension    Other and unspecified hyperlipidemia    PE (pulmonary embolism)     Past Surgical History:  Procedure Laterality Date   COLONOSCOPY W/ POLYPECTOMY  06/22/2014   4 colon polyps; repeat 3 years; [place].   CYSTOSCOPY WITH URETHRAL DILATATION N/A 04/11/2021   Procedure: CYSTOSCOPY WITH URETHRAL  BALLOON DILATATION;  Surgeon: Robley Fries, MD;  Location: [facility];  Service: Urology;  Laterality: N/A;  39 MINS    Family History  Problem Relation Age of Onset   Diabetes Other     SOCIAL HISTORY: Social History   Socioeconomic History   Marital status: Single    Spouse name: Not on file   Number of children: Not on file   Years of education: Not on file   Highest education level: Not on file  Occupational History   Not on file  Tobacco Use   Smoking status: Former    Types: Cigars    Quit date: 09/29/1975    Years since quitting: 46.7   Smokeless tobacco: Never  Substance and Sexual Activity   Alcohol use: No   Drug use: No   Sexual activity: Not Currently  Other Topics Concern   Not on file  Social History  Narrative   Marital status: widowed x 2; married x 22 years first marriage; second marriage x 43.  Not dating.      Children:  3 children; 3 grandchildren; 3 great-grandchildren.      Lives: with son      Employment: retired in 2012; truck Geophysicist/field seismologist      Tobacco: quit in 1977; cigars and pipes      Alcohol: none      Exercise:  Walking 3 days per week in park.      ADLs: drives; walks unassisted; daughter-in-law does laundry; goes to grocery store.   Social Determinants of Health   Financial Resource Strain: Not on file  Food Insecurity: Not on file  Transportation Needs: Not on file  Physical Activity: Not on file  Stress: Not on file  Social Connections: Not on file  Intimate Partner Violence: Not on file    No Known Allergies  Current Outpatient Medications  Medication Sig Dispense Refill   aspirin 81 MG chewable tablet Chew 1 tablet (81 mg total) by mouth daily.     atorvastatin (LIPITOR) 40 MG tablet Take 1 tablet (40 mg total) by mouth daily.     insulin aspart (NOVOLOG) 100 UNIT/ML injection 0-9 Units, Subcutaneous, 3 times daily before meals & bedtime,CBG < 70: Implement Hypoglycemia Standing Orders and refer to  Hypoglycemia Standing Orders sidebar report CBG 70 - 120: 0 units CBG 121 - 150: 1 unit CBG 151 - 200: 2 units CBG 201 - 250: 3 units CBG 251 - 300: 5 units CBG 301 - 350: 7 units CBG 351 - 400: 9 units CBG > 400: call MD 10 mL 11   metoprolol tartrate (LOPRESSOR) 25 MG tablet Take 1 tablet (25 mg total) by mouth 2 (two) times daily.     penicillin G potassium 60000 UNIT/ML Inject 3 Million Units into the vein every 6 (six) hours.     amLODipine (NORVASC) 5 MG tablet Take 1 tablet (5 mg total) by mouth daily. 30 tablet 1   No current facility-administered medications for this visit.    REVIEW OF SYSTEMS:  '[X]'$  denotes positive finding, '[ ]'$  denotes negative finding Cardiac  Comments:  Chest pain or chest pressure:    Shortness of breath upon exertion:    Short of  breath when lying flat:    Irregular heart rhythm:        Vascular    Pain in calf, thigh, or hip brought on by ambulation:    Pain in feet at night that wakes you up from your sleep:     Blood clot in your veins:    Leg swelling:         Pulmonary    Oxygen at home:    Productive cough:     Wheezing:         Neurologic    Sudden weakness in arms or legs:     Sudden numbness in arms or legs:     Sudden onset of difficulty speaking or slurred speech:    Temporary loss of vision in one eye:     Problems with dizziness:         Gastrointestinal    Blood in stool:     Vomited blood:         Genitourinary    Burning when urinating:     Blood in urine:        Psychiatric    Major depression:         Hematologic    Bleeding problems:    Problems with blood clotting too easily:        Skin    Rashes or ulcers:        Constitutional    Fever or chills:      PHYSICAL EXAM: Vitals:   06/23/22 1405  BP: 136/70  Pulse: (!) 49  Resp: 14  Temp: (!) 97.4 F (36.3 C)  TempSrc: Temporal  Weight: 128 lb (58.1 kg)  Height: 5' (1.524 m)    GENERAL: The patient is a well-nourished male, in no acute distress. The vital signs are documented above. CARDIAC: There is a regular rate and rhythm.  VASCULAR:  Bilateral femoral pulses palpable No palpable pedal pulses Dry eschar to both heels PULMONARY: No respiratory distress. ABDOMEN: Soft and non-tender. MUSCULOSKELETAL: There are no major deformities or cyanosis. NEUROLOGIC: No focal weakness or paresthesias are detected. PSYCHIATRIC: The patient has a normal affect.       DATA:   N/A  Assessment/Plan:  88 year old male currently residing in a SNF at [facility] that presents for evaluation of bilateral lower extremity arterial wounds.  I cannot feel any pedal pulses on exam and I agree that he has evidence of arterial insufficiency.  That being said the patient is nonambulatory and does not stand or  transfer.  According to the nurses from [facility] he is a two-person transfer requiring complete assistance.  Discussed there is no role for lower extremity revascularization and limb salvage in a patient that does not use his extremities.  Discussed he can continue local wound care as well as offloading his heels with boot protectors.  Discussed I would offer him above-knee amputations if he develops pain or infection.  He has no interest in this at this time.  Can follow-up with me PRN.   Marty Heck, MD [facility] Office: [phone]

## 2022-07-17 ENCOUNTER — Ambulatory Visit: Payer: PPO | Attending: Cardiovascular Disease | Admitting: Cardiovascular Disease

## 2022-07-17 ENCOUNTER — Encounter: Payer: Self-pay | Admitting: Cardiovascular Disease

## 2022-07-17 DIAGNOSIS — I10 Essential (primary) hypertension: Secondary | ICD-10-CM | POA: Diagnosis not present

## 2022-07-17 DIAGNOSIS — I70223 Atherosclerosis of native arteries of extremities with rest pain, bilateral legs: Secondary | ICD-10-CM | POA: Diagnosis not present

## 2022-07-17 NOTE — Patient Instructions (Signed)
Medication Instructions:  Your physician recommends that you continue on your current medications as directed. Please refer to the Current Medication list given to you today.  *If you need a refill on your cardiac medications before your next appointment, please call your pharmacy*   Follow-Up: At [facility], you and your health needs are our priority.  As part of our continuing mission to provide you with exceptional heart care, we have created designated Provider Care Teams.  These Care Teams include your primary Cardiologist (physician) and Advanced Practice Providers (APPs -  Physician Assistants and Nurse Practitioners) who all work together to provide you with the care you need, when you need it.  We recommend signing up for the patient portal called "MyChart".  Sign up information is provided on this After Visit Summary.  MyChart is used to connect with patients for Virtual Visits (Telemedicine).  Patients are able to view lab/test results, encounter notes, upcoming appointments, etc.  Non-urgent messages can be sent to your provider as well.   To learn more about what you can do with MyChart, go to [email or web address].    Your next appointment:   We will see you on an as needed basis.  Provider:   Jonathan Berry, MD  

## 2022-07-17 NOTE — Assessment & Plan Note (Signed)
History of essential hypertension blood pressure measured today at 123/63.  He is on amlodipine and metoprolol

## 2022-07-17 NOTE — Progress Notes (Signed)
07/17/2022 Sergio [name]   [birth year]  [id number]  Primary Physician Forrest Moron, MD Primary Cardiologist: Lorretta Harp MD Garret Reddish, [place], [place]  HPI:  Sergio [name] is a 88 y.o. thin, frail and chronically ill-appearing African-American male who has seen Coletta Memos, FNP in the office 04/24/2022.  He comes back today for follow-up.  He apparently lives in a skilled nursing facility.  He has advanced dementia.  Other problems include diabetes, and hypertension.  He does have critical limb ischemia and is followed by Dr. Carlis Abbott who does not feel that revascularization is required.  He is minimally ambulatory.  He denies chest pain or shortness of breath.    Current Meds  Medication Sig   amLODipine (NORVASC) 5 MG tablet Take 1 tablet (5 mg total) by mouth daily.   aspirin 81 MG chewable tablet Chew 1 tablet (81 mg total) by mouth daily.   Glucerna (GLUCERNA) LIQD Take 237 mLs by mouth.   insulin aspart (NOVOLOG) 100 UNIT/ML injection 0-9 Units, Subcutaneous, 3 times daily before meals & bedtime,CBG < 70: Implement Hypoglycemia Standing Orders and refer to Hypoglycemia Standing Orders sidebar report CBG 70 - 120: 0 units CBG 121 - 150: 1 unit CBG 151 - 200: 2 units CBG 201 - 250: 3 units CBG 251 - 300: 5 units CBG 301 - 350: 7 units CBG 351 - 400: 9 units CBG > 400: call MD   levothyroxine (SYNTHROID) 25 MCG tablet Take 25 mcg by mouth daily before breakfast.   metoprolol tartrate (LOPRESSOR) 25 MG tablet Take 1 tablet (25 mg total) by mouth 2 (two) times daily.     No Known Allergies  Social History   Socioeconomic History   Marital status: Single    Spouse name: Not on file   Number of children: Not on file   Years of education: Not on file   Highest education level: Not on file  Occupational History   Not on file  Tobacco Use   Smoking status: Former    Types: Cigars    Quit date: 09/29/1975    Years since quitting: 46.8   Smokeless tobacco: Never  Substance  and Sexual Activity   Alcohol use: No   Drug use: No   Sexual activity: Not Currently  Other Topics Concern   Not on file  Social History Narrative   Marital status: widowed x 2; married x 22 years first marriage; second marriage x 51.  Not dating.      Children:  3 children; 3 grandchildren; 3 great-grandchildren.      Lives: with son      Employment: retired in 2012; truck Geophysicist/field seismologist      Tobacco: quit in 1977; cigars and pipes      Alcohol: none      Exercise:  Walking 3 days per week in park.      ADLs: drives; walks unassisted; daughter-in-law does laundry; goes to grocery store.   Social Determinants of Health   Financial Resource Strain: Not on file  Food Insecurity: Not on file  Transportation Needs: Not on file  Physical Activity: Not on file  Stress: Not on file  Social Connections: Not on file  Intimate Partner Violence: Not on file     Review of Systems: General: negative for chills, fever, night sweats or weight changes.  Cardiovascular: negative for chest pain, dyspnea on exertion, edema, orthopnea, palpitations, paroxysmal nocturnal dyspnea or shortness of breath Dermatological: negative for rash Respiratory:  negative for cough or wheezing Urologic: negative for hematuria Abdominal: negative for nausea, vomiting, diarrhea, bright red blood per rectum, melena, or hematemesis Neurologic: negative for visual changes, syncope, or dizziness All other systems reviewed and are otherwise negative except as noted above.    Blood pressure 123/63, pulse (!) 49, SpO2 97 %.  General appearance: alert and no distress Neck: no adenopathy, no carotid bruit, no JVD, supple, symmetrical, trachea midline, and thyroid not enlarged, symmetric, no tenderness/mass/nodules Lungs: clear to auscultation bilaterally Heart: regular rate and rhythm, S1, S2 normal, no murmur, click, rub or gallop Extremities: extremities normal, atraumatic, no cyanosis or edema Pulses: Absent pedal  pulses Skin: Changes consistent with chronic limb ischemia Neurologic: Grossly normal  EKG not performed today  ASSESSMENT AND PLAN:   Uncontrolled hypertension History of essential hypertension blood pressure measured today at 123/63.  He is on amlodipine and metoprolol  Critical limb ischemia of both lower extremities ([place]) Followed by Dr. Fortunato Curling     Lorretta Harp MD [facility] 07/17/2022 12:02 PM

## 2022-07-17 NOTE — Assessment & Plan Note (Signed)
Followed by Dr. Fortunato Curling

## 2022-08-26 ENCOUNTER — Non-Acute Institutional Stay: Payer: Self-pay | Admitting: Hospice

## 2022-08-26 ENCOUNTER — Telehealth: Payer: Self-pay

## 2022-08-26 DIAGNOSIS — E43 Unspecified severe protein-calorie malnutrition: Secondary | ICD-10-CM

## 2022-08-26 DIAGNOSIS — Z515 Encounter for palliative care: Secondary | ICD-10-CM

## 2022-08-26 DIAGNOSIS — I10 Essential (primary) hypertension: Secondary | ICD-10-CM

## 2022-08-26 DIAGNOSIS — R41841 Cognitive communication deficit: Secondary | ICD-10-CM

## 2022-08-26 DIAGNOSIS — E118 Type 2 diabetes mellitus with unspecified complications: Secondary | ICD-10-CM

## 2022-08-26 NOTE — Progress Notes (Signed)
[facility] Consult Note Telephone: [phone]  Fax: [phone]  PATIENT NAME: Sergio [name] [place] [address] [phone] (home)  DOB: 05/05/1933 MRN: [id number]  PRIMARY CARE PROVIDER:    Martinique Miller, NP  REFERRING PROVIDER:   Martinique Miller, NP  RESPONSIBLE PARTY:  Sergio [name] Information     Name Relation Home Work Mobile   Sergio [name]   [phone]        I met face to face with patient at facility. Visit to build trust and highlight Palliative Medicine as specialized medical care for people living with serious illness, aimed at facilitating better quality of life through symptoms relief, assisting with advance care planning and complex medical decision making.  NP spoke with Sergio [name] for additional information since patient with cognitive deficits.  He endorsed palliative service; elected DO NOT RESUSCITATE status for patient. ASSESSMENT AND / RECOMMENDATIONS:   Advance Care Planning: Our advance care planning conversation included a discussion about:    The value and importance of advance care planning  Difference between Hospice and Palliative care Exploration of goals of care in the event of a sudden injury or illness  Identification and preparation of a healthcare agent  Review and updating or creation of an  advance directive document . Decision not to resuscitate or to de-escalate disease focused treatments due to poor prognosis.  CODE STATUS: Patient was previously a full code but is now a DO NOT RESUSCITATE.  Primary NP informed accordingly.  Goals of Care: Goals include to maximize quality of life and symptom management  I spent  20  minutes providing this initial consultation. More than 50% of the time in this consultation was spent on counseling patient and coordinating  communication. --------------------------------------------------------------------------------------------------------------------------------------  Symptom Management/Plan: Protein caloric malnutrition: Ongoing weight loss, current weight 106 Ib down from 126 Ibs 3 months ago, height is  5 feet 4 inches, Albumin is 2.05 Jul 2022. Offer assistance during meals to ensure adquate oral intake. Ensure BID.Routine CBC CMP.  Initiate mirtazapine 7.5 mg daily to help boost appetite. Hypertension: Managed with Amlodipine.  Diabetes Mellitus: A1c 6.4 in Sept 2023. Continue Insulin. Repeat A1c every 3-6 months.  Dementia: advanced, memory loss, confusion and not able to engage in meaningful conversation. Encourage socialization and participation in facility activities.  Continue ongoing supportive care.  Osteomyelitis: x-ray results with concern for probable osteomyelitis to right heel. Sergio [name] vein and vascular recommends amputation. Collaborative discussion with primary NP on his  worsening vascular issues  - critical limb ischemia and significant decline in functional status, and poor prognosis  Follow up: Palliative care will continue to follow for complex medical decision making, advance care planning, and clarification of goals. Return 6 weeks or prn. Encouraged to call provider sooner with any concerns.   Family /Caregiver/Community Supports: Patient in SNF for ongoing care  HOSPICE ELIGIBILITY/DIAGNOSIS: TBD  Chief Complaint: Initial Palliative care visit  HISTORY OF PRESENT ILLNESS:  Sergio [name] is a 88 y.o. year old male  with multiple morbidities requiring close monitoring and with high risk of complications and  mortality:  recent  x-ray results with concern for probable osteomyelitis to right heel, Type 2 Diabetes Mellitus, Protein caloric malnutrition, Cognitive communication deficit   History obtained from review of EMR, discussion with primary team, caregiver, family and/or Mr.  [name].  Review and summarization of Epic records shows history from other than patient. Rest of 10 point ROS asked and negative. Independent  interpretation of tests and reviewed as needed, available labs, patient records, imaging, studies and related documents from the EMR  PAST MEDICAL HISTORY:  Active Ambulatory Problems    Diagnosis Date Noted   Uncontrolled hypertension    Other and unspecified hyperlipidemia    DM2 (diabetes mellitus, type 2) ([place]) 12/18/2011   Colon polyps 07/04/2014   Nonspecific abnormal electrocardiogram (ECG) (EKG) 02/02/2017   Loss of weight 03/24/2017   Hypoglycemia 03/24/2017   Iatrogenic hypotension 03/24/2017   Urinary frequency 10/01/2017   Type 2 diabetes mellitus without complication, without long-term current use of insulin ([place]) 10/01/2017   Pain due to onychomycosis of toenails of both feet 10/13/2019   Acute renal failure superimposed on stage 3b chronic kidney disease ([place]) 06/30/2020   Hydronephrosis 06/30/2020   Sepsis due to urinary tract infection ([place]) 08/16/2020   Severe sepsis ([place]) 08/17/2020   Chronic indwelling Foley catheter 08/19/2020   History of pulmonary embolus (PE) 08/19/2020   Essential hypertension 08/29/2020   Syncope and collapse 03/18/2021   Hypernatremia 03/27/2022   Acute cystitis 73/41/9379   Acute metabolic encephalopathy 02/40/9735   Acute on chronic urinary retention 03/27/2022   NSTEMI (non-ST elevated myocardial infarction) ([place]) 03/27/2022   Hypercalcemia 03/27/2022   Chronic diastolic CHF (congestive heart failure) ([place]) 03/27/2022   Pressure injury of skin 03/28/2022   Critical limb ischemia of both lower extremities ([place]) 06/23/2022   Resolved Ambulatory Problems    Diagnosis Date Noted   No Resolved Ambulatory Problems   Past Medical History:  Diagnosis Date   Diabetes mellitus    Hypertension    PE (pulmonary embolism)     SOCIAL HX:  Social History   Tobacco Use   Smoking status: Former     Types: Cigars    Quit date: 09/29/1975    Years since quitting: 46.9   Smokeless tobacco: Never  Substance Use Topics   Alcohol use: No     FAMILY HX:  Family History  Problem Relation Age of Onset   Diabetes Other       ALLERGIES: No Known Allergies    PERTINENT MEDICATIONS:  Outpatient Encounter Medications as of 08/26/2022  Medication Sig   amLODipine (NORVASC) 5 MG tablet Take 1 tablet (5 mg total) by mouth daily.   aspirin 81 MG chewable tablet Chew 1 tablet (81 mg total) by mouth daily.   atorvastatin (LIPITOR) 40 MG tablet Take 1 tablet (40 mg total) by mouth daily.   Glucerna (GLUCERNA) LIQD Take 237 mLs by mouth.   insulin aspart (NOVOLOG) 100 UNIT/ML injection 0-9 Units, Subcutaneous, 3 times daily before meals & bedtime,CBG < 70: Implement Hypoglycemia Standing Orders and refer to Hypoglycemia Standing Orders sidebar report CBG 70 - 120: 0 units CBG 121 - 150: 1 unit CBG 151 - 200: 2 units CBG 201 - 250: 3 units CBG 251 - 300: 5 units CBG 301 - 350: 7 units CBG 351 - 400: 9 units CBG > 400: call MD   levothyroxine (SYNTHROID) 25 MCG tablet Take 25 mcg by mouth daily before breakfast.   metoprolol tartrate (LOPRESSOR) 25 MG tablet Take 1 tablet (25 mg total) by mouth 2 (two) times daily.   penicillin G potassium 60000 UNIT/ML Inject 3 Million Units into the vein every 6 (six) hours.   No facility-administered encounter medications on file as of 08/26/2022.     Thank you for the opportunity to participate in the care of Sergio [name].  The palliative care team will continue to follow.  Please call our office at [phone] if we can be of additional assistance.   Note: Portions of this note were generated with Lobbyist. Dictation errors may occur despite best attempts at proofreading.  Teodoro Spray, NP

## 2022-08-26 NOTE — Telephone Encounter (Signed)
I spoke with [facility] wound care RN. She states that the Wound care NP would like patient to be seen sooner for B/L non-healing heel wounds and Medial ankle wounds. I advised that  per Dr. Ainsley Spinner previous note no role for lower extremity revascularization and limb salvage in a patient that does not use his extremities.  Discussed he can continue local wound care options and is to f/u with [place] if patient agrees to proceed with Amputation. Rosine Door stated the patient is unable to make his own decisions and his son would be his next point of contact. I did inquire if the son is agreeable of proceeding with amputation. Rosine Door was unsure but stated she would contact his Son to discuss and call us back to schedule if his son agrees.

## 2022-09-15 ENCOUNTER — Ambulatory Visit: Payer: PPO | Admitting: Vascular Surgery

## 2022-09-28 DEATH — deceased
